# Patient Record
Sex: Male | Born: 1970 | Race: White | Hispanic: No | Marital: Married | Smoking: Never smoker
Health system: Southern US, Community
[De-identification: ages and names within clinical notes are randomized; demographics above are authoritative.]

## PROBLEM LIST (undated history)

## (undated) DIAGNOSIS — F419 Anxiety disorder, unspecified: Secondary | ICD-10-CM

## (undated) DIAGNOSIS — F1020 Alcohol dependence, uncomplicated: Secondary | ICD-10-CM

## (undated) DIAGNOSIS — I10 Essential (primary) hypertension: Secondary | ICD-10-CM

## (undated) DIAGNOSIS — E119 Type 2 diabetes mellitus without complications: Secondary | ICD-10-CM

## (undated) DIAGNOSIS — I82409 Acute embolism and thrombosis of unspecified deep veins of unspecified lower extremity: Secondary | ICD-10-CM

## (undated) HISTORY — PX: BLOOD PATCH: SHX1245

## (undated) HISTORY — PX: TONSILLECTOMY: SUR1361

---

## 1997-10-04 ENCOUNTER — Emergency Department (HOSPITAL_COMMUNITY): Admission: EM | Admit: 1997-10-04 | Discharge: 1997-10-04 | Payer: Self-pay | Admitting: Emergency Medicine

## 2007-07-04 ENCOUNTER — Emergency Department (HOSPITAL_COMMUNITY): Admission: EM | Admit: 2007-07-04 | Discharge: 2007-07-04 | Payer: Self-pay | Admitting: Emergency Medicine

## 2007-07-05 ENCOUNTER — Emergency Department (HOSPITAL_COMMUNITY): Admission: EM | Admit: 2007-07-05 | Discharge: 2007-07-05 | Payer: Self-pay | Admitting: Emergency Medicine

## 2007-10-11 ENCOUNTER — Emergency Department (HOSPITAL_COMMUNITY): Admission: EM | Admit: 2007-10-11 | Discharge: 2007-10-11 | Payer: Self-pay | Admitting: Emergency Medicine

## 2007-11-23 ENCOUNTER — Emergency Department (HOSPITAL_COMMUNITY): Admission: EM | Admit: 2007-11-23 | Discharge: 2007-11-23 | Payer: Self-pay | Admitting: Emergency Medicine

## 2007-11-25 ENCOUNTER — Emergency Department (HOSPITAL_COMMUNITY): Admission: EM | Admit: 2007-11-25 | Discharge: 2007-11-25 | Payer: Self-pay | Admitting: Family Medicine

## 2008-02-16 ENCOUNTER — Emergency Department (HOSPITAL_COMMUNITY): Admission: EM | Admit: 2008-02-16 | Discharge: 2008-02-16 | Payer: Self-pay | Admitting: Family Medicine

## 2008-06-14 ENCOUNTER — Inpatient Hospital Stay (HOSPITAL_COMMUNITY): Admission: AD | Admit: 2008-06-14 | Discharge: 2008-06-19 | Payer: Self-pay | Admitting: Psychiatry

## 2008-06-14 ENCOUNTER — Ambulatory Visit: Payer: Self-pay | Admitting: Psychiatry

## 2008-08-02 ENCOUNTER — Emergency Department (HOSPITAL_COMMUNITY): Admission: EM | Admit: 2008-08-02 | Discharge: 2008-08-02 | Payer: Self-pay | Admitting: Emergency Medicine

## 2008-12-20 ENCOUNTER — Emergency Department (HOSPITAL_COMMUNITY): Admission: EM | Admit: 2008-12-20 | Discharge: 2008-12-20 | Payer: Self-pay | Admitting: Emergency Medicine

## 2008-12-21 ENCOUNTER — Encounter: Payer: Self-pay | Admitting: Emergency Medicine

## 2008-12-22 ENCOUNTER — Ambulatory Visit: Payer: Self-pay | Admitting: Cardiology

## 2008-12-22 ENCOUNTER — Inpatient Hospital Stay (HOSPITAL_COMMUNITY): Admission: EM | Admit: 2008-12-22 | Discharge: 2008-12-22 | Payer: Self-pay | Admitting: Internal Medicine

## 2009-11-09 ENCOUNTER — Ambulatory Visit: Payer: Self-pay | Admitting: Internal Medicine

## 2009-12-27 ENCOUNTER — Encounter (INDEPENDENT_AMBULATORY_CARE_PROVIDER_SITE_OTHER): Payer: Self-pay | Admitting: *Deleted

## 2009-12-27 LAB — CONVERTED CEMR LAB: Microalb, Ur: 2.63 mg/dL — ABNORMAL HIGH (ref 0.00–1.89)

## 2009-12-28 ENCOUNTER — Inpatient Hospital Stay (HOSPITAL_COMMUNITY): Admission: EM | Admit: 2009-12-28 | Discharge: 2009-12-28 | Payer: Self-pay | Admitting: Emergency Medicine

## 2010-06-06 LAB — COMPREHENSIVE METABOLIC PANEL
ALT: 105 U/L — ABNORMAL HIGH (ref 0–53)
AST: 129 U/L — ABNORMAL HIGH (ref 0–37)
Albumin: 4.1 g/dL (ref 3.5–5.2)
Alkaline Phosphatase: 84 U/L (ref 39–117)
GFR calc Af Amer: >60 mL/min (ref >60)
Glucose, Bld: 116 mg/dL — ABNORMAL HIGH (ref 70–99)
Potassium: 3.8 mEq/L (ref 3.5–5.1)
Sodium: 135 mEq/L (ref 135–145)
Total Protein: 7.6 g/dL (ref 6.0–8.3)

## 2010-06-06 LAB — DIFFERENTIAL
Basophils Relative: 1 % (ref 0–1)
Eosinophils Absolute: 0.2 10*3/uL (ref 0.0–0.7)
Eosinophils Relative: 2 % (ref 0–5)
Lymphs Abs: 2.1 10*3/uL (ref 0.7–4.0)
Monocytes Absolute: 1 10*3/uL (ref 0.1–1.0)
Monocytes Relative: 12 % (ref 3–12)

## 2010-06-06 LAB — CBC
MCV: 89.2 fL (ref 78.0–100.0)
Platelets: 134 10*3/uL — ABNORMAL LOW (ref 150–400)
RBC: 4.89 MIL/uL (ref 4.22–5.81)
WBC: 8.3 10*3/uL (ref 4.0–10.5)

## 2010-06-06 LAB — CK: Total CK: 460 U/L — ABNORMAL HIGH (ref 7–232)

## 2010-06-27 LAB — BASIC METABOLIC PANEL
CO2: 31 mEq/L (ref 19–32)
Chloride: 98 mEq/L (ref 96–112)
GFR calc Af Amer: >60 mL/min (ref >60)
Sodium: 138 mEq/L (ref 135–145)

## 2010-06-27 LAB — CBC
Hemoglobin: 14.1 g/dL (ref 13.0–17.0)
MCHC: 35.1 g/dL (ref 30.0–36.0)
MCV: 91.1 fL (ref 78.0–100.0)
RBC: 4.43 MIL/uL (ref 4.22–5.81)
WBC: 5.2 10*3/uL (ref 4.0–10.5)

## 2010-06-27 LAB — DIFFERENTIAL
Basophils Relative: 1 % (ref 0–1)
Lymphs Abs: 0.8 10*3/uL (ref 0.7–4.0)
Monocytes Absolute: 0.6 10*3/uL (ref 0.1–1.0)
Monocytes Relative: 12 % (ref 3–12)
Neutro Abs: 3.7 10*3/uL (ref 1.7–7.7)
Neutrophils Relative %: 71 % (ref 43–77)

## 2010-06-27 LAB — MAGNESIUM: Magnesium: 1.9 mg/dL (ref 1.5–2.5)

## 2010-06-27 LAB — CARDIAC PANEL(CRET KIN+CKTOT+MB+TROPI)
CK, MB: 2.2 ng/mL (ref 0.3–4.0)
Relative Index: 1.4 (ref 0.0–2.5)
Troponin I: 0.02 ng/mL (ref 0.00–0.06)

## 2010-06-27 LAB — BRAIN NATRIURETIC PEPTIDE: Pro B Natriuretic peptide (BNP): <30 pg/mL (ref 0.0–100.0)

## 2010-06-28 LAB — TRICYCLICS SCREEN, URINE: TCA Scrn: NOT DETECTED

## 2010-06-28 LAB — DIFFERENTIAL
Basophils Absolute: 0 10*3/uL (ref 0.0–0.1)
Basophils Relative: 1 % (ref 0–1)
Eosinophils Absolute: 0 10*3/uL (ref 0.0–0.7)
Eosinophils Relative: 1 % (ref 0–5)
Eosinophils Relative: 2 % (ref 0–5)
Lymphocytes Relative: 29 % (ref 12–46)
Monocytes Absolute: 0.3 10*3/uL (ref 0.1–1.0)
Monocytes Relative: 10 % (ref 3–12)
Neutro Abs: 2.7 10*3/uL (ref 1.7–7.7)
Neutrophils Relative %: 68 % (ref 43–77)

## 2010-06-28 LAB — CBC
HCT: 44.3 % (ref 39.0–52.0)
HCT: 44.5 % (ref 39.0–52.0)
Hemoglobin: 15.4 g/dL (ref 13.0–17.0)
Hemoglobin: 15.4 g/dL (ref 13.0–17.0)
MCHC: 34.9 g/dL (ref 30.0–36.0)
MCV: 89.9 fL (ref 78.0–100.0)
RBC: 4.92 MIL/uL (ref 4.22–5.81)
RBC: 4.93 MIL/uL (ref 4.22–5.81)
WBC: 4.6 10*3/uL (ref 4.0–10.5)

## 2010-06-28 LAB — POCT CARDIAC MARKERS: Troponin i, poc: <0.05 ng/mL (ref 0.00–0.09)

## 2010-06-28 LAB — BASIC METABOLIC PANEL
CO2: 28 mEq/L (ref 19–32)
Chloride: 100 mEq/L (ref 96–112)
Creatinine, Ser: 0.64 mg/dL (ref 0.4–1.5)
GFR calc Af Amer: >60 mL/min (ref >60)

## 2010-06-28 LAB — COMPREHENSIVE METABOLIC PANEL
ALT: 116 U/L — ABNORMAL HIGH (ref 0–53)
Alkaline Phosphatase: 115 U/L (ref 39–117)
BUN: 5 mg/dL — ABNORMAL LOW (ref 6–23)
CO2: 28 mEq/L (ref 19–32)
Chloride: 97 mEq/L (ref 96–112)
GFR calc non Af Amer: >60 mL/min (ref >60)
Glucose, Bld: 138 mg/dL — ABNORMAL HIGH (ref 70–99)
Potassium: 3.7 mEq/L (ref 3.5–5.1)
Sodium: 136 mEq/L (ref 135–145)
Total Bilirubin: 0.7 mg/dL (ref 0.3–1.2)
Total Protein: 7.9 g/dL (ref 6.0–8.3)

## 2010-06-28 LAB — ETHANOL: Alcohol, Ethyl (B): 387 mg/dL — ABNORMAL HIGH (ref 0–10)

## 2010-06-28 LAB — RAPID URINE DRUG SCREEN, HOSP PERFORMED
Barbiturates: NOT DETECTED
Barbiturates: NOT DETECTED
Benzodiazepines: NOT DETECTED
Benzodiazepines: NOT DETECTED

## 2010-07-04 LAB — COMPREHENSIVE METABOLIC PANEL
ALT: 109 U/L — ABNORMAL HIGH (ref 0–53)
Albumin: 4.4 g/dL (ref 3.5–5.2)
Alkaline Phosphatase: 93 U/L (ref 39–117)
BUN: 3 mg/dL — ABNORMAL LOW (ref 6–23)
Chloride: 98 mEq/L (ref 96–112)
Glucose, Bld: 87 mg/dL (ref 70–99)
Potassium: 4 mEq/L (ref 3.5–5.1)
Sodium: 134 mEq/L — ABNORMAL LOW (ref 135–145)
Total Bilirubin: 0.7 mg/dL (ref 0.3–1.2)

## 2010-07-04 LAB — DRUGS OF ABUSE SCREEN W/O ALC, ROUTINE URINE
Amphetamine Screen, Ur: NEGATIVE
Barbiturate Quant, Ur: NEGATIVE
Benzodiazepines.: POSITIVE — AB
Cocaine Metabolites: NEGATIVE
Marijuana Metabolite: NEGATIVE

## 2010-07-04 LAB — CBC
HCT: 43.5 % (ref 39.0–52.0)
Hemoglobin: 14.9 g/dL (ref 13.0–17.0)
RBC: 4.83 MIL/uL (ref 4.22–5.81)
WBC: 4.1 10*3/uL (ref 4.0–10.5)

## 2010-07-04 LAB — DIFFERENTIAL
Basophils Absolute: 0 10*3/uL (ref 0.0–0.1)
Basophils Relative: 1 % (ref 0–1)
Eosinophils Absolute: 0 10*3/uL (ref 0.0–0.7)
Monocytes Absolute: 0.5 10*3/uL (ref 0.1–1.0)
Neutro Abs: 2.5 10*3/uL (ref 1.7–7.7)
Neutrophils Relative %: 62 % (ref 43–77)

## 2010-07-04 LAB — URINALYSIS, ROUTINE W REFLEX MICROSCOPIC
Glucose, UA: NEGATIVE mg/dL
Hgb urine dipstick: NEGATIVE
Protein, ur: NEGATIVE mg/dL
Specific Gravity, Urine: 1.008 (ref 1.005–1.030)
pH: 7 (ref 5.0–8.0)

## 2010-07-04 LAB — BENZODIAZEPINE, QUANTITATIVE, URINE
Alprazolam (GC/LC/MS), ur confirm: NEGATIVE
Oxazepam GC/MS Conf: 880 ng/mL

## 2010-08-06 NOTE — H&P (Signed)
NAME:  [name], [name] NO.:  [id number]   MEDICAL RECORD NO.:  [id number]          PATIENT TYPE:  IPS   LOCATION:  0501                          FACILITY:  [facility]   PHYSICIAN:  Landry Corporal, N.P.    DATE OF BIRTH:  1970/06/05   DATE OF ADMISSION:  06/14/2008  DATE OF DISCHARGE:                       PSYCHIATRIC ADMISSION ASSESSMENT   Redictating a psychiatric admit note on [name], a 40-year male  voluntarily admitted June 14, 2008.   HISTORY OF PRESENT ILLNESS:  The patient is here to get help with his  alcohol use.  He has been drinking as he states too much up to 18  beers a day with his last drink yesterday.  The patient states that he  is drinking during the night, drinking in the morning, waking up shaking  and needs to drink a beer.  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  He has been drinking since  the age of 33 but his drinking has been escalating.  He also recently  received a DUI and has some legal ramifications due to that.  He has had  problems with insomnia and takes an occasional Xanax to help sleep.  He  denies any weight loss.  Denies any other recreational drug use.  Denies  any depression or suicidal thinking.   PAST PSYCHIATRIC HISTORY:  First admission to [facility].  No other psychiatric admissions or has been in rehab prior.   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  It  appears to be on April 8.   FAMILY HISTORY:  Grandfather with alcohol.  Again taking an occasional  Xanax to help him sleep.  Primary care [name]:  None.  Medical  problems:  Denies any acute or chronic health issues.  Does report that  he has had elevated liver enzymes before when he tried to get life  insurance.   MEDICATIONS:  None prior to arrival.   DRUG ALLERGIES:  No known allergies.   REVIEW OF SYSTEMS:  Significant for history of chest pain related to  withdrawal symptoms.  Positive for insomnia.  Negative weight changes,  positive for nausea.  No diarrhea.  No falls.  No headaches.  No  seizures.  No depression.   PHYSICAL EXAMINATION:  Temperature of 97.9, heart rate 90, respirations  20, blood pressure is 178/114, 5 feet 5 inches tall, 166 pounds.  GENERAL APPEARANCE:  This is a well-nourished male in no acute distress.  His head is atraumatic.  Negative lymphadenopathy.  CHEST:  Clear.  BREAST:  Exam is deferred.  HEART:  Regular  rate and rhythm.  No murmurs, gallops or rubs.  ABDOMEN:  Soft abdomen, nontender, nondistended with bowel sounds  auscultated.  PELVIC/GU:  Deferred.  EXTREMITIES:  5+ against resistance.  No clubbing, no edema.  SKIN:  Warm and dry with  no obvious lacerations or rashes.  NEUROLOGIC:  Findings are intact.  He does have some mild upper  extremity tremors.   LABORATORY DATA:  TSH of 4.067, magnesium of 2.4, platelet count is low  at 89, AST is 90 with a reference range of 0 to 37, ALT is 109 with a  reference range of 0 of 53.   MENTAL STATUS EXAM:  The patient at this time is resting in bed.  He is  agreeable to talk.  He provides a good history.  His speech is clear,  normal pitch and tone.  Again his affect seems sincere and on again  provides a good history of his alcohol use and symptoms.  Thought  processes are coherent and goal directed.  Cognitive function intact.  Memory appears intact.  Judgment and insight is good.   Axis I:  Alcohol dependence.  Axis II:  Deferred.  Axis III:  No known medical conditions.  Axis IV:  Problems related to legal system.  Axis V:  Current is 40.   PLAN:  Detox patient.  Librium protocol; work on relapse prevention.  We  will continue to assess comorbidities, identify his support group.  The  patient may  benefit from a long-term treatment plan.  His tentative  length of stay at this time is 3-5 days.      Landry Corporal, N.P.     JO/MEDQ  D:  06/15/2008  T:  06/15/2008  Job:  [id number]

## 2010-08-09 NOTE — Discharge Summary (Signed)
NAME:  [name], CUFF NO.:  [id number]   MEDICAL RECORD NO.:  [id number]          PATIENT TYPE:  IPS   LOCATION:  0502                          FACILITY:  [facility]   PHYSICIAN:  Geoffery Lyons, M.D.      DATE OF BIRTH:  05-15-1970   DATE OF ADMISSION:  06/14/2008  DATE OF DISCHARGE:  06/19/2008                               DISCHARGE SUMMARY   CHIEF COMPLAINT:  This was first admission to [facility]  for this 40 year old male voluntarily admitted endorsing wanted help  with alcohol use.  Has been drinking as he states too much, up to 18  beers with his last drink the day before.  He drinks during the night.  Drinks in morning, wakes up shaking and needs to drink a beer.  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  Has been drinking  since age 16, has been escalating.  Recently received a DUI.  Problems  with insomnia, takes a Xanax every now and then.   PAST PSYCHIATRIC HISTORY:  [facility] time at [facility].  No other  previous admissions.   ALCOHOL AND DRUG HABITS:  Persistent use of alcohol as already  described.   MEDICAL HISTORY:  Noncontributory.   MEDICATIONS:  None.   PHYSICAL EXAMINATION:  Exam failed to show any acute findings.   LABORATORY WORK:  White blood cells 4.1, hemoglobin 14.9, sodium 134,  potassium 4.0, glucose 87, SGOT 90, SGPT 109.  Drug screen positive for  benzodiazepines.   MENTAL STATUS EXAM:  Reveals alert cooperative male.  Mood anxious.  Affect anxious.  Thought processes logical, coherent and relevant.  Once  a week, aware of the effects of alcohol on his system.  No active  suicidal/homicidal ideas, no hallucinations.  No delusions.  Cognition  well-preserved.   ADMISSION DIAGNOSES:  AXIS I:  Alcohol dependence.  AXIS II:  No diagnosis.  AXIS III:  Elevated liver enzymes.  AXIS IV: Moderate.  AXIS V:  On admission 35,  GAF in the last year 60.   COURSE IN THE HOSPITAL:  Was admitted, started individual and group  psychotherapy.  Detoxified with Librium.  We gave him some trazodone for  sleep.  As already stated, drinking 18 pack a day for the last 5 years.  He was laid off, started drinking more.  Unable to find construction  work, getting more frustrated.  Wakes up withdrawal and sweating,  throwing up and shaking.  He was with fiancee.  March 26 continued to be  detoxed.  He continued to have the shaky feeling, but endorsed that he  was a little better.  Felt that the Librium wore off pretty fast.  We  continued to adjust the Librium to his symptoms.  Had a family session  with the mother and the girlfriend.  By March 28, he was starting to  feel better.  Endorsed he wanted to stay another day because he stated  he was going to relapse, not sure  if he was going to still have a job.  He was going to continue to deal with the girlfriend.  Family still felt  that he needed to go to a 30-day program.  He was pretty firm that he  was not wanting to do that.  He wanted to use Xanax or Klonopin to stop  drinking.  If he was not to get the Xanax or the Klonopin, then he was  going to smoke pot.  Girlfriend was supportive of him but concerned  about his commitment to sobriety.  He had made some contact with the AA  community.  He was connected with counseling and [facility].  He  was willing to take the Antabuse and the Revia, so we went ahead and  discharged to outpatient follow-up.  Upon discharge, in full contact  with reality.  We had extended the Librium detox to facilitate his  transition home.  He was on Antabuse and Revia.   DISCHARGE DIAGNOSES:  AXIS I:  Alcohol dependence, substance-induced,  anxiety.  AXIS II:  No diagnosis.  AXIS III:  No diagnosis.  AXIS IV:  Moderate.  AXIS V:  On discharge, 55.   DISCHARGE MEDICATIONS:  1. Librium 25 one daily for 7 days then discontinue.  2.  Antabuse 250 one daily.  3. Revia 50 mg one half daily for 7 days then increase to one.   FOLLOWUP:  Follow-up at [facility] and AA      Geoffery Lyons, M.D.  Electronically Signed     IL/MEDQ  D:  07/13/2008  T:  07/13/2008  Job:  [id number]

## 2010-12-25 LAB — CULTURE, ROUTINE-ABSCESS

## 2012-08-06 ENCOUNTER — Encounter (HOSPITAL_COMMUNITY): Payer: Self-pay | Admitting: Emergency Medicine

## 2012-08-06 ENCOUNTER — Emergency Department (HOSPITAL_COMMUNITY): Payer: Self-pay

## 2012-08-06 ENCOUNTER — Inpatient Hospital Stay (HOSPITAL_COMMUNITY)
Admission: EM | Admit: 2012-08-06 | Discharge: 2012-08-17 | DRG: 438 | Disposition: A | Discharge status: Home / Self Care | Payer: MEDICAID | Attending: Internal Medicine | Admitting: Internal Medicine

## 2012-08-06 DIAGNOSIS — F10939 Alcohol use, unspecified with withdrawal, unspecified: Secondary | ICD-10-CM | POA: Diagnosis not present

## 2012-08-06 DIAGNOSIS — B370 Candidal stomatitis: Secondary | ICD-10-CM | POA: Diagnosis present

## 2012-08-06 DIAGNOSIS — Z86718 Personal history of other venous thrombosis and embolism: Secondary | ICD-10-CM

## 2012-08-06 DIAGNOSIS — E785 Hyperlipidemia, unspecified: Secondary | ICD-10-CM

## 2012-08-06 DIAGNOSIS — F10231 Alcohol dependence with withdrawal delirium: Secondary | ICD-10-CM

## 2012-08-06 DIAGNOSIS — K7010 Alcoholic hepatitis without ascites: Secondary | ICD-10-CM

## 2012-08-06 DIAGNOSIS — G9340 Encephalopathy, unspecified: Secondary | ICD-10-CM

## 2012-08-06 DIAGNOSIS — F1010 Alcohol abuse, uncomplicated: Secondary | ICD-10-CM

## 2012-08-06 DIAGNOSIS — F419 Anxiety disorder, unspecified: Secondary | ICD-10-CM

## 2012-08-06 DIAGNOSIS — Z9119 Patient's noncompliance with other medical treatment and regimen: Secondary | ICD-10-CM

## 2012-08-06 DIAGNOSIS — Z91199 Patient's noncompliance with other medical treatment and regimen due to unspecified reason: Secondary | ICD-10-CM

## 2012-08-06 DIAGNOSIS — R780 Finding of alcohol in blood: Secondary | ICD-10-CM

## 2012-08-06 DIAGNOSIS — K7460 Unspecified cirrhosis of liver: Secondary | ICD-10-CM

## 2012-08-06 DIAGNOSIS — D696 Thrombocytopenia, unspecified: Secondary | ICD-10-CM

## 2012-08-06 DIAGNOSIS — F10931 Alcohol use, unspecified with withdrawal delirium: Secondary | ICD-10-CM

## 2012-08-06 DIAGNOSIS — K859 Acute pancreatitis without necrosis or infection, unspecified: Principal | ICD-10-CM | POA: Diagnosis present

## 2012-08-06 DIAGNOSIS — K449 Diaphragmatic hernia without obstruction or gangrene: Secondary | ICD-10-CM

## 2012-08-06 DIAGNOSIS — K766 Portal hypertension: Secondary | ICD-10-CM

## 2012-08-06 DIAGNOSIS — Z87891 Personal history of nicotine dependence: Secondary | ICD-10-CM

## 2012-08-06 DIAGNOSIS — R6510 Systemic inflammatory response syndrome (SIRS) of non-infectious origin without acute organ dysfunction: Secondary | ICD-10-CM | POA: Diagnosis present

## 2012-08-06 DIAGNOSIS — I82409 Acute embolism and thrombosis of unspecified deep veins of unspecified lower extremity: Secondary | ICD-10-CM

## 2012-08-06 DIAGNOSIS — K7030 Alcoholic cirrhosis of liver without ascites: Secondary | ICD-10-CM | POA: Diagnosis present

## 2012-08-06 DIAGNOSIS — I10 Essential (primary) hypertension: Secondary | ICD-10-CM

## 2012-08-06 DIAGNOSIS — F411 Generalized anxiety disorder: Secondary | ICD-10-CM | POA: Diagnosis present

## 2012-08-06 DIAGNOSIS — R161 Splenomegaly, not elsewhere classified: Secondary | ICD-10-CM

## 2012-08-06 DIAGNOSIS — K7682 Hepatic encephalopathy: Secondary | ICD-10-CM | POA: Diagnosis present

## 2012-08-06 DIAGNOSIS — K7290 Hepatic failure, unspecified without coma: Secondary | ICD-10-CM | POA: Diagnosis present

## 2012-08-06 DIAGNOSIS — E876 Hypokalemia: Secondary | ICD-10-CM

## 2012-08-06 HISTORY — DX: Alcohol dependence, uncomplicated: F10.20

## 2012-08-06 HISTORY — DX: Acute embolism and thrombosis of unspecified deep veins of unspecified lower extremity: I82.409

## 2012-08-06 HISTORY — DX: Anxiety disorder, unspecified: F41.9

## 2012-08-06 HISTORY — DX: Essential (primary) hypertension: I10

## 2012-08-06 LAB — CBC WITH DIFFERENTIAL/PLATELET
Basophils Absolute: 0 10*3/uL (ref 0.0–0.1)
Eosinophils Absolute: 0 10*3/uL (ref 0.0–0.7)
Eosinophils Relative: 0 % (ref 0–5)
Lymphocytes Relative: 5 % — ABNORMAL LOW (ref 12–46)
MCH: 32.6 pg (ref 26.0–34.0)
MCV: 89.2 fL (ref 78.0–100.0)
Neutrophils Relative %: 81 % — ABNORMAL HIGH (ref 43–77)
Platelets: 13 10*3/uL — CL (ref 150–400)
RBC: 4.54 MIL/uL (ref 4.22–5.81)
RDW: 14.6 % (ref 11.5–15.5)
WBC: 8 10*3/uL (ref 4.0–10.5)

## 2012-08-06 LAB — CBC
HCT: 37.7 % — ABNORMAL LOW (ref 39.0–52.0)
MCH: 31.6 pg (ref 26.0–34.0)
MCHC: 35.3 g/dL (ref 30.0–36.0)
RDW: 14.9 % (ref 11.5–15.5)

## 2012-08-06 LAB — COMPREHENSIVE METABOLIC PANEL
ALT: 40 U/L (ref 0–53)
AST: 92 U/L — ABNORMAL HIGH (ref 0–37)
Alkaline Phosphatase: 131 U/L — ABNORMAL HIGH (ref 39–117)
Calcium: 9.2 mg/dL (ref 8.4–10.5)
Potassium: 3.5 mEq/L (ref 3.5–5.1)
Sodium: 132 mEq/L — ABNORMAL LOW (ref 135–145)
Total Protein: 7.9 g/dL (ref 6.0–8.3)

## 2012-08-06 LAB — URINALYSIS, ROUTINE W REFLEX MICROSCOPIC
Bilirubin Urine: NEGATIVE
Glucose, UA: 100 mg/dL — AB
Ketones, ur: NEGATIVE mg/dL
Leukocytes, UA: NEGATIVE
Protein, ur: 100 mg/dL — AB
pH: 6.5 (ref 5.0–8.0)

## 2012-08-06 LAB — LIPID PANEL
Cholesterol: 259 mg/dL — ABNORMAL HIGH (ref 0–200)
HDL: 50 mg/dL (ref >39)

## 2012-08-06 LAB — GLUCOSE, CAPILLARY: Glucose-Capillary: 135 mg/dL — ABNORMAL HIGH (ref 70–99)

## 2012-08-06 LAB — PROTIME-INR: INR: 1.28 (ref 0.00–1.49)

## 2012-08-06 LAB — LACTIC ACID, PLASMA: Lactic Acid, Venous: 1.9 mmol/L (ref 0.5–2.2)

## 2012-08-06 MED ORDER — SODIUM CHLORIDE 0.9 % IV SOLN
3.0000 g | Freq: Once | INTRAVENOUS | Status: AC
  Administered 2012-08-06: 3 g via INTRAVENOUS
  Filled 2012-08-06: qty 6

## 2012-08-06 MED ORDER — IOHEXOL 300 MG/ML  SOLN
80.0000 mL | Freq: Once | INTRAMUSCULAR | Status: AC | PRN
  Administered 2012-08-06: 80 mL via INTRAVENOUS

## 2012-08-06 MED ORDER — SODIUM CHLORIDE 0.9 % IJ SOLN
3.0000 mL | Freq: Two times a day (BID) | INTRAMUSCULAR | Status: DC
  Administered 2012-08-06 – 2012-08-13 (×9): 3 mL via INTRAVENOUS

## 2012-08-06 MED ORDER — FOLIC ACID 1 MG PO TABS
1.0000 mg | ORAL_TABLET | Freq: Every day | ORAL | Status: DC
  Administered 2012-08-06 – 2012-08-17 (×10): 1 mg via ORAL
  Filled 2012-08-06 (×12): qty 1

## 2012-08-06 MED ORDER — LORAZEPAM 2 MG/ML IJ SOLN
1.0000 mg | Freq: Four times a day (QID) | INTRAMUSCULAR | Status: DC | PRN
  Administered 2012-08-06 – 2012-08-07 (×6): 1 mg via INTRAVENOUS
  Filled 2012-08-06 (×7): qty 1

## 2012-08-06 MED ORDER — ADULT MULTIVITAMIN W/MINERALS CH
1.0000 | ORAL_TABLET | Freq: Every day | ORAL | Status: DC
  Administered 2012-08-06 – 2012-08-17 (×10): 1 via ORAL
  Filled 2012-08-06 (×12): qty 1

## 2012-08-06 MED ORDER — SODIUM CHLORIDE 0.9 % IJ SOLN
3.0000 mL | INTRAMUSCULAR | Status: DC | PRN
  Administered 2012-08-07: 3 mL via INTRAVENOUS

## 2012-08-06 MED ORDER — THIAMINE HCL 100 MG/ML IJ SOLN
100.0000 mg | Freq: Every day | INTRAMUSCULAR | Status: DC
  Administered 2012-08-10: 100 mg via INTRAVENOUS
  Filled 2012-08-06 (×12): qty 1

## 2012-08-06 MED ORDER — LORAZEPAM 2 MG/ML IJ SOLN
1.0000 mg | Freq: Once | INTRAMUSCULAR | Status: AC
  Administered 2012-08-06: 1 mg via INTRAVENOUS
  Filled 2012-08-06: qty 1

## 2012-08-06 MED ORDER — SODIUM CHLORIDE 0.9 % IV BOLUS (SEPSIS)
1000.0000 mL | Freq: Once | INTRAVENOUS | Status: AC
  Administered 2012-08-06: 1000 mL via INTRAVENOUS
  Administered 2012-08-06: 17:00:00 via INTRAVENOUS

## 2012-08-06 MED ORDER — SODIUM CHLORIDE 0.9 % IV SOLN: 250.0000 mL | INTRAVENOUS | Status: DC | PRN

## 2012-08-06 MED ORDER — SODIUM CHLORIDE 0.9 % IJ SOLN
3.0000 mL | Freq: Two times a day (BID) | INTRAMUSCULAR | Status: DC
  Administered 2012-08-06 – 2012-08-12 (×8): 3 mL via INTRAVENOUS
  Administered 2012-08-12: 09:00:00 via INTRAVENOUS
  Administered 2012-08-13: 3 mL via INTRAVENOUS

## 2012-08-06 MED ORDER — ONDANSETRON HCL 4 MG PO TABS: 4.0000 mg | ORAL_TABLET | Freq: Four times a day (QID) | ORAL | Status: DC | PRN

## 2012-08-06 MED ORDER — MORPHINE SULFATE 4 MG/ML IJ SOLN
4.0000 mg | INTRAMUSCULAR | Status: AC | PRN
  Administered 2012-08-06 (×2): 4 mg via INTRAVENOUS
  Filled 2012-08-06 (×2): qty 1

## 2012-08-06 MED ORDER — SODIUM CHLORIDE 0.9 % IV SOLN
INTRAVENOUS | Status: DC
  Administered 2012-08-06: 10:00:00 via INTRAVENOUS
  Administered 2012-08-06: 125 mL via INTRAVENOUS
  Administered 2012-08-07 – 2012-08-08 (×4): via INTRAVENOUS

## 2012-08-06 MED ORDER — IOHEXOL 300 MG/ML  SOLN
25.0000 mL | INTRAMUSCULAR | Status: AC
  Administered 2012-08-06: 25 mL via ORAL

## 2012-08-06 MED ORDER — SODIUM CHLORIDE 0.9 % IV BOLUS (SEPSIS)
500.0000 mL | Freq: Once | INTRAVENOUS | Status: AC
  Administered 2012-08-06: 500 mL via INTRAVENOUS

## 2012-08-06 MED ORDER — LORAZEPAM 1 MG PO TABS: 1.0000 mg | ORAL_TABLET | Freq: Four times a day (QID) | ORAL | Status: DC | PRN

## 2012-08-06 MED ORDER — LORAZEPAM 2 MG/ML IJ SOLN
2.0000 mg | Freq: Once | INTRAMUSCULAR | Status: AC
  Administered 2012-08-06: 2 mg via INTRAVENOUS

## 2012-08-06 MED ORDER — ONDANSETRON HCL 4 MG/2ML IJ SOLN
4.0000 mg | INTRAMUSCULAR | Status: AC | PRN
  Administered 2012-08-06 (×2): 4 mg via INTRAVENOUS
  Filled 2012-08-06 (×2): qty 2

## 2012-08-06 MED ORDER — ONDANSETRON HCL 4 MG/2ML IJ SOLN
4.0000 mg | Freq: Four times a day (QID) | INTRAMUSCULAR | Status: DC | PRN
  Administered 2012-08-06 – 2012-08-07 (×2): 4 mg via INTRAVENOUS
  Filled 2012-08-06 (×2): qty 2

## 2012-08-06 MED ORDER — PANTOPRAZOLE SODIUM 40 MG PO TBEC
40.0000 mg | DELAYED_RELEASE_TABLET | Freq: Every day | ORAL | Status: DC
  Administered 2012-08-06 – 2012-08-07 (×2): 40 mg via ORAL
  Filled 2012-08-06 (×2): qty 1

## 2012-08-06 MED ORDER — HYDROMORPHONE HCL PF 1 MG/ML IJ SOLN
0.5000 mg | Freq: Four times a day (QID) | INTRAMUSCULAR | Status: DC | PRN
  Administered 2012-08-06 – 2012-08-07 (×5): 0.5 mg via INTRAVENOUS
  Filled 2012-08-06 (×5): qty 1

## 2012-08-06 MED ORDER — VITAMIN B-1 100 MG PO TABS
100.0000 mg | ORAL_TABLET | Freq: Every day | ORAL | Status: DC
  Administered 2012-08-06 – 2012-08-17 (×9): 100 mg via ORAL
  Filled 2012-08-06 (×12): qty 1

## 2012-08-06 MED ORDER — SODIUM CHLORIDE 0.9 % IV SOLN
INTRAVENOUS | Status: DC
  Administered 2012-08-06: 12:00:00 via INTRAVENOUS

## 2012-08-06 NOTE — H&P (Signed)
Hospital Admission Note Date: 08/06/2012  Patient name: [name] Medical record number: [id number] Date of birth: 1971-02-06 Age: 42 y.o. Gender: male PCP: No primary provider on file.  Medical Service: IMTS  Attending physician:  Dr. Kem Kays    1st Contact:  Dr. Burtis Junes  Pager: [phone] 2nd Contact:  Dr. Dierdre Searles   [phone] After 5 pm or weekends: 1st Contact:      Pager: [phone] 2nd Contact:      Pager: [phone]  Chief Complaint: Abdominal pain  History of Present Illness: [name] 42 yo man former [facility] pt with a pmh of excessive EtOH use consisting of a 12 pack beer per day last drink being morning of admission and HTN. Pt was experiencing generalized epigastric pain that worsened overnight and unrelieved by pepto bismol. Pt has had one similar episode that was less severe in nature 4+ months ago but didn't seek medical attention. Pt has not seen physician in 6+ months and was not compliant with HTN meds. Pt has had some epistasis episodes over past 2 months and easy bruising but no sites of active bleeding, hematemesis, hematochezia, PRBPR, or hemoptysis. Denied any fever/chills, diarrhea, constipation, CP, SOB, HA, blurry vision, recent sick contacts, or travel. 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 Per the pt and mother who is present for the interview states pt has been to rehab and detox several times with longest period of sobriety lasting about 5 months. Relapsing triggers appear to be social groups and celebrations.   Meds: Current Outpatient Rx  Name  Route  Sig  Dispense  Refill  . ALPRAZolam (XANAX) 0.5 MG tablet   Oral   Take 0.25 mg by mouth at bedtime as needed for sleep or anxiety.           Allergies: Allergies as of 08/06/2012  . (No Known Allergies)   Past Medical History  Diagnosis Date  . DVT (deep venous thrombosis)   . Anxiety   . Alcoholic   . Hypertension    Past Surgical History   Procedure Laterality Date  . Tonsillectomy     No family history on file. History   Social History  . Marital Status: Single    Spouse Name: N/A    Number of Children: N/A  . Years of Education: N/A   Occupational History  . Not on file.   Social History Main Topics  . Smoking status: Former Games developer  . Smokeless tobacco: Not on file  . Alcohol Use: 7.2 oz/week    12 Cans of beer per week  . Drug Use: No  . Sexually Active: Not on file   Other Topics Concern  . Not on file   Social History Narrative  . No narrative on file    Review of Systems: Pertinent items are noted in HPI.  Physical Exam: Blood pressure 163/95, pulse 92, height 5\' 7"  (1.702 m), weight 175 lb (79.379 kg), SpO2 94.00%. BP 163/95  Pulse 92  Ht 5\' 7"  (1.702 m)  Wt 175 lb (79.379 kg)  BMI 27.4 kg/m2  SpO2 94%  General Appearance:    Alert, cooperative, no distress, appears stated age  Head:    Normocephalic, without obvious abnormality, atraumatic  Eyes:    PERRL, conjunctiva/corneas clear, EOM's intact, fundi    benign, both eyes       Ears:    Normal TM's and external ear canals, both ears  Nose:   Nares  normal, septum midline, mucosa normal, no drainage    or sinus tenderness  Throat:   Lips, mucosa, and tongue normal; teeth and gums normal, MMM  Neck:   Supple, symmetrical, trachea midline, no adenopathy;       thyroid:  No enlargement/tenderness/nodules; no carotid   bruit or JVD  Back:     Symmetric, no curvature, ROM normal, no CVA tenderness  Lungs:     Clear to auscultation bilaterally, respirations unlabored  Chest wall:    No tenderness or deformity  Heart:    Regular rate and rhythm, S1 and S2 normal, no murmur, rub   or gallop  Abdomen:     Soft, slightly distended, no fluid wave, hepatomegaly 3cm under costophrenic angle, tenderness to deep palpation epigastric no voluntary guarding or rebounding , bowel sounds active all four quadrants,  no masses  Extremities:   Extremities  normal, atraumatic, no cyanosis or edema  Pulses:   2+ and symmetric all extremities  Skin:   Skin color, texture, turgor normal, no rashes or lesions  Lymph nodes:   Cervical, supraclavicular, and axillary nodes normal  Neurologic:   CNII-XII intact. Normal strength, sensation and reflexes      throughout    Lab results: Basic Metabolic Panel:  Recent Labs  16/10/96 0934  NA 132*  K 3.5  CL 93*  CO2 21  GLUCOSE 143*  BUN 3*  CREATININE 0.44*  CALCIUM 9.2   Liver Function Tests:  Recent Labs  08/06/12 0934  AST 92*  ALT 40  ALKPHOS 131*  BILITOT 2.0*  PROT 7.9  ALBUMIN 3.4*    Recent Labs  08/06/12 0934  LIPASE 146*   CBC:  Recent Labs  08/06/12 0934  WBC 8.0  NEUTROABS 6.5  HGB 14.8  HCT 40.5  MCV 89.2  PLT 13*   Urine Drug Screen: Drugs of Abuse     Component Value Date/Time   LABOPIA NONE DETECTED 12/21/2008 1123   LABOPIA NEGATIVE 06/19/2008 0624   COCAINSCRNUR NONE DETECTED 12/21/2008 1123   COCAINSCRNUR NEGATIVE 06/19/2008 0624   LABBENZ NONE DETECTED 12/21/2008 1123   LABBENZ  Value: POSITIVE (NOTE) Result repeated and verified. Sent for confirmatory testing* 06/19/2008 0624   AMPHETMU NONE DETECTED 12/21/2008 1123   AMPHETMU NEGATIVE 06/19/2008 0624   THCU NONE DETECTED 12/21/2008 1123   LABBARB  Value: NONE DETECTED        DRUG SCREEN FOR MEDICAL PURPOSES ONLY.  IF CONFIRMATION IS NEEDED FOR ANY PURPOSE, NOTIFY LAB WITHIN 5 DAYS.        LOWEST DETECTABLE LIMITS FOR URINE DRUG SCREEN Drug Class       Cutoff (ng/mL) Amphetamine      1000 Barbiturate      200 Benzodiazepine   200 Tricyclics       300 Opiates          300 Cocaine          300 THC              50 12/21/2008 1123    Alcohol Level:  Recent Labs  08/06/12 0934  ETH 198*   Urinalysis:  Recent Labs  08/06/12 0945  COLORURINE YELLOW  LABSPEC 1.004*  PHURINE 6.5  GLUCOSEU 100*  HGBUR SMALL*  BILIRUBINUR NEGATIVE  KETONESUR NEGATIVE  PROTEINUR 100*  UROBILINOGEN 1.0  NITRITE  NEGATIVE  LEUKOCYTESUR NEGATIVE   Imaging results:  Dg Chest 2 View  08/06/2012   *RADIOLOGY REPORT*  Clinical Data: Abdominal pain, hypertension  CHEST - 2 VIEW  Comparison: 12/21/2008  Findings: Stable heart size and vascularity.  No focal pneumonia, collapse, consolidation, edema pattern, effusion or pneumothorax. Trachea midline.  Hiatal hernia noted with an air-fluid level.  IMPRESSION: Hiatal hernia.  No acute chest process.   Original Report Authenticated By: Judie Petit. Miles Costain, M.D.   Ct Abdomen Pelvis W Contrast  08/06/2012   *RADIOLOGY REPORT*  Clinical Data: Crampy abdominal pain across the lower abdomen, nausea and vomiting  CT ABDOMEN AND PELVIS WITH CONTRAST  Technique:  Multidetector CT imaging of the abdomen and pelvis was performed following the standard protocol during bolus administration of intravenous contrast.  Contrast: 80mL OMNIPAQUE IOHEXOL 300 MG/ML  SOLN  Comparison: None.  Findings:  Stigmata of cirrhosis with nodularity of the hepatic contour. Findings suggestive of portal venous hypertension with recanalization of the periumbilical vein and splenomegaly, with the spleen measuring approximately 17 cm in length (image 28, series 2).  The portal vein is patent. There are no definitive gastric varices identified on this examination.  There are multiple hypoattenuating lesions scattered throughout the liver with dominant lesion within the inferior aspect of the right lobe of liver measuring 8 mm in diameter (image 7, series 5, too small to accurately characterize.   Normal appearance of the gallbladder. No intra or extrahepatic biliary duct dilatation.  No ascites.  There is a minimal amount of stranding about the tail of the pancreas (image 38 and 41, series 2 which will nonspecific may be indicative of acute uncomplicated pancreatitis.  There is homogeneous enhancement of the pancreatic parenchyma.  No definite pancreatic duct dilatation on this non pancreatic protocol CT.  There is  symmetric enhancement and excretion of the bilateral kidneys.  Note is made of a slightly horizontal lie of the left kidney, likely secondary to mass effect from the enlarged spleen. No discrete renal lesions.  No definite renal stones in the postcontrast examination.  No urinary obstruction. Normal appearance of the bilateral adrenal glands.  Moderate to large sized hiatal hernia.  Ingested enteric contrast extends to the level of the rectum.  No evidence of enteric obstruction.  The bowel is normal in course and caliber without wall thickening or evidence of obstruction.  Normal appearance of the appendix.  No pneumoperitoneum, pneumatosis or portal venous gas.  Limited visualization of the lower thorax is negative for focal airspace opacity or pleural effusion.  Normal heart size.  No pericardial effusion.  No acute or aggressive osseous abnormalities.  IMPRESSION:  1.  Subtle stranding about the tail of the pancreas suggestive of acute uncomplicated pancreatitis.  Correlation with amylase and lipase is recommended. 2.  Stigmata of cirrhosis and portal venous hypertension as above. 3.  There are multiple hypoattenuating lesions scattered throughout the liver which are too small to accurately characterize.  Given stigmata of cirrhosis, further evaluation for hepatoma may be obtained with non emergent abdominal MRI as clinically indicated.  4.  Moderate to large size hiatal hernia.   Original Report Authenticated By: Tacey Ruiz, MD    Assessment & Plan by Problem: 1. Acute uncomplicated pancreatitis: elevated lipase and CT Abd findings consistent with uncomplicated pancreatitis. AG 18 w/o ketones in urine ddx include alcoholic ketoacidosis but would need to r/o MI and mesenteric ischemia given non-focal exam. Pt denied any trauma, steroids, other drugs, recent GI procedures, AI disease, or hyperCa.  -NPO -IVF 125cc -NS 1L bolus (would continue based on pt response in terms of UO) -dilaudid q6 prn  pain -bmet daily -  zofran prn -EKG, tropsx3 -lactate -Mg -lipid panel for TG  2. Thrombocytopenia in setting of liver cirrhosis 2/2 EtOH: Pt has had chronic thrombocytopenia as low as 55 but today 13.  -repeat CBC -PT/INR -SCDs  3. Alcoholic hepatitis: glasgow alcoholic hepatitis score wasn't able to be fully calculated given missing INR and PT. CMP showed slightly elevated AST 92 which is lower than pts usual presentation and slightly elevated AlkPO4 at 131 and Total Bili 2.0.  -cardiac monitor -hepatitis panel -pt/ptt/INR -UDS -CIWA protocol  DVT pptx: SCDs   Dispo: Disposition is deferred at this time, awaiting improvement of current medical problems. Anticipated discharge in approximately 2-3 day(s).   The patient does not have a current PCP (No primary provider on file.), therefore will not be requiring [facility] follow-up after discharge.   The patient does not have transportation limitations that hinder transportation to clinic appointments.  Signed: Christen Bame 08/06/2012, 1:13 PM

## 2012-08-06 NOTE — ED Notes (Signed)
Attempted to call report. Nurse to call back in 10mins.  

## 2012-08-06 NOTE — Progress Notes (Signed)
Utilization review completed.  P.J. Marks Scalera,RN,BSN Case Manager [phone]  

## 2012-08-06 NOTE — ED Notes (Signed)
Platelet critical value given to Clarene Duke, MD.

## 2012-08-06 NOTE — ED Notes (Signed)
CT advised okay for patient to drink second cup of contrast.   Patient drinking at this time.

## 2012-08-06 NOTE — ED Provider Notes (Signed)
History     CSN: [id number]  Arrival date & time 08/06/12  4098   First MD Initiated Contact with Patient 08/06/12 [phone]      Chief Complaint  Patient presents with  . Abdominal Pain     HPI Pt was seen at 0850.   Per pt and family, c/o gradual onset and persistence of constant generalized abd "pain" since yesterday.  Has been associated with nausea and several intermittent episodes of dry heaving.  Describes the abd pain as "cramping."  States he has had similar symptoms several months ago, but "they only lasted a day" before spontaneously resolving and he did not see medical evaluation.  Endorses hx daily etoh use, LD this morning.  Denies diarrhea, no fevers, no back pain, no rash, no CP/SOB, no black or blood in stools or emesis, no testicular pain/swelling.       Past Medical History  Diagnosis Date  . DVT (deep venous thrombosis)   . Anxiety   . Alcoholic     Past Surgical History  Procedure Laterality Date  . Tonsillectomy       History  Substance Use Topics  . Smoking status: Former Games developer  . Smokeless tobacco: Not on file  . Alcohol Use: 7.2 oz/week    12 Cans of beer per week      Review of Systems ROS: Statement: All systems negative except as marked or noted in the HPI; Constitutional: Negative for fever and chills. ; ; Eyes: Negative for eye pain, redness and discharge. ; ; ENMT: Negative for ear pain, hoarseness, nasal congestion, sinus pressure and sore throat. ; ; Cardiovascular: Negative for chest pain, palpitations, diaphoresis, dyspnea and peripheral edema. ; ; Respiratory: Negative for cough, wheezing and stridor. ; ; Gastrointestinal: +nausea, abd pain. Negative for vomiting, diarrhea, blood in stool, hematemesis, jaundice and rectal bleeding. . ; ; Genitourinary: Negative for dysuria, flank pain and hematuria. ; ; Musculoskeletal: Negative for back pain and neck pain. Negative for swelling and trauma.; ; Skin: Negative for pruritus, rash, abrasions,  blisters, bruising and skin lesion.; ; Neuro: Negative for headache, lightheadedness and neck stiffness. Negative for weakness, altered level of consciousness , altered mental status, extremity weakness, paresthesias, involuntary movement, seizure and syncope.       Allergies  Review of patient's allergies indicates no known allergies.  Home Medications  No current outpatient prescriptions on file.  Ht 5\' 7"  (1.702 m)  Wt 175 lb (79.379 kg)  BMI 27.4 kg/m2  Physical Exam 0855: Physical examination:  Nursing notes reviewed; Vital signs and O2 SAT reviewed;  Constitutional: Well developed, Well nourished, Well hydrated, Uncomfortable appearing; Head:  Normocephalic, atraumatic; Eyes: EOMI, PERRL, No scleral icterus; ENMT: Mouth and pharynx normal, Mucous membranes moist; Neck: Supple, Full range of motion, No lymphadenopathy; Cardiovascular: Regular rate and rhythm, No murmur, rub, or gallop; Respiratory: Breath sounds clear & equal bilaterally, No rales, rhonchi, wheezes.  Speaking full sentences with ease, Normal respiratory effort/excursion; Chest: Nontender, Movement normal; Abdomen: Soft, +diffusely tender to palp, esp mid-epigastric area. No rebound or guarding. Nondistended, Normal bowel sounds; Genitourinary: No CVA tenderness; Extremities: Pulses normal, No tenderness, No edema, No calf edema or asymmetry.; Neuro: AA&Ox3, Major CN grossly intact.  Speech clear. No gross focal motor or sensory deficits in extremities.; Skin: Color normal, Warm, Dry.   ED Course  Procedures      MDM  MDM Reviewed: previous chart, nursing note and vitals Reviewed previous: labs Interpretation: labs, x-ray and CT scan  Results for orders placed during the hospital encounter of 08/06/12  ETHANOL      Result Value Range   Alcohol, Ethyl (B) 198 (*) 0 - 11 mg/dL  URINALYSIS, ROUTINE W REFLEX MICROSCOPIC      Result Value Range   Color, Urine YELLOW  YELLOW   APPearance CLEAR  CLEAR    Specific Gravity, Urine 1.004 (*) 1.005 - 1.030   pH 6.5  5.0 - 8.0   Glucose, UA 100 (*) NEGATIVE mg/dL   Hgb urine dipstick SMALL (*) NEGATIVE   Bilirubin Urine NEGATIVE  NEGATIVE   Ketones, ur NEGATIVE  NEGATIVE mg/dL   Protein, ur 161 (*) NEGATIVE mg/dL   Urobilinogen, UA 1.0  0.0 - 1.0 mg/dL   Nitrite NEGATIVE  NEGATIVE   Leukocytes, UA NEGATIVE  NEGATIVE  CBC WITH DIFFERENTIAL      Result Value Range   WBC 8.0  4.0 - 10.5 K/uL   RBC 4.54  4.22 - 5.81 MIL/uL   Hemoglobin 14.8  13.0 - 17.0 g/dL   HCT 09.6  04.5 - 40.9 %   MCV 89.2  78.0 - 100.0 fL   MCH 32.6  26.0 - 34.0 pg   MCHC 36.5 (*) 30.0 - 36.0 g/dL   RDW 81.1  91.4 - 78.2 %   Platelets 13 (*) 150 - 400 K/uL   Neutrophils Relative % 81 (*) 43 - 77 %   Neutro Abs 6.5  1.7 - 7.7 K/uL   Lymphocytes Relative 5 (*) 12 - 46 %   Lymphs Abs 0.4 (*) 0.7 - 4.0 K/uL   Monocytes Relative 14 (*) 3 - 12 %   Monocytes Absolute 1.1 (*) 0.1 - 1.0 K/uL   Eosinophils Relative 0  0 - 5 %   Eosinophils Absolute 0.0  0.0 - 0.7 K/uL   Basophils Relative 0  0 - 1 %   Basophils Absolute 0.0  0.0 - 0.1 K/uL  COMPREHENSIVE METABOLIC PANEL      Result Value Range   Sodium 132 (*) 135 - 145 mEq/L   Potassium 3.5  3.5 - 5.1 mEq/L   Chloride 93 (*) 96 - 112 mEq/L   CO2 21  19 - 32 mEq/L   Glucose, Bld 143 (*) 70 - 99 mg/dL   BUN 3 (*) 6 - 23 mg/dL   Creatinine, Ser 9.56 (*) 0.50 - 1.35 mg/dL   Calcium 9.2  8.4 - 21.3 mg/dL   Total Protein 7.9  6.0 - 8.3 g/dL   Albumin 3.4 (*) 3.5 - 5.2 g/dL   AST 92 (*) 0 - 37 U/L   ALT 40  0 - 53 U/L   Alkaline Phosphatase 131 (*) 39 - 117 U/L   Total Bilirubin 2.0 (*) 0.3 - 1.2 mg/dL   GFR calc non Af Amer >90  >90 mL/min   GFR calc Af Amer >90  >90 mL/min  LIPASE, BLOOD      Result Value Range   Lipase 146 (*) 11 - 59 U/L  URINE MICROSCOPIC-ADD ON      Result Value Range   RBC / HPF 0-2  <3 RBC/hpf   Dg Chest 2 View 08/06/2012   *RADIOLOGY REPORT*  Clinical Data: Abdominal pain, hypertension   CHEST - 2 VIEW  Comparison: 12/21/2008  Findings: Stable heart size and vascularity.  No focal pneumonia, collapse, consolidation, edema pattern, effusion or pneumothorax. Trachea midline.  Hiatal hernia noted with an air-fluid level.  IMPRESSION: Hiatal hernia.  No acute chest  process.   Original Report Authenticated By: Judie Petit. Miles Costain, M.D.   Ct Abdomen Pelvis W Contrast 08/06/2012   *RADIOLOGY REPORT*  Clinical Data: Crampy abdominal pain across the lower abdomen, nausea and vomiting  CT ABDOMEN AND PELVIS WITH CONTRAST  Technique:  Multidetector CT imaging of the abdomen and pelvis was performed following the standard protocol during bolus administration of intravenous contrast.  Contrast: 80mL OMNIPAQUE IOHEXOL 300 MG/ML  SOLN  Comparison: None.  Findings:  Stigmata of cirrhosis with nodularity of the hepatic contour. Findings suggestive of portal venous hypertension with recanalization of the periumbilical vein and splenomegaly, with the spleen measuring approximately 17 cm in length (image 28, series 2).  The portal vein is patent. There are no definitive gastric varices identified on this examination.  There are multiple hypoattenuating lesions scattered throughout the liver with dominant lesion within the inferior aspect of the right lobe of liver measuring 8 mm in diameter (image 7, series 5, too small to accurately characterize.   Normal appearance of the gallbladder. No intra or extrahepatic biliary duct dilatation.  No ascites.  There is a minimal amount of stranding about the tail of the pancreas (image 38 and 41, series 2 which will nonspecific may be indicative of acute uncomplicated pancreatitis.  There is homogeneous enhancement of the pancreatic parenchyma.  No definite pancreatic duct dilatation on this non pancreatic protocol CT.  There is symmetric enhancement and excretion of the bilateral kidneys.  Note is made of a slightly horizontal lie of the left kidney, likely secondary to mass effect from  the enlarged spleen. No discrete renal lesions.  No definite renal stones in the postcontrast examination.  No urinary obstruction. Normal appearance of the bilateral adrenal glands.  Moderate to large sized hiatal hernia.  Ingested enteric contrast extends to the level of the rectum.  No evidence of enteric obstruction.  The bowel is normal in course and caliber without wall thickening or evidence of obstruction.  Normal appearance of the appendix.  No pneumoperitoneum, pneumatosis or portal venous gas.  Limited visualization of the lower thorax is negative for focal airspace opacity or pleural effusion.  Normal heart size.  No pericardial effusion.  No acute or aggressive osseous abnormalities.  IMPRESSION:  1.  Subtle stranding about the tail of the pancreas suggestive of acute uncomplicated pancreatitis.  Correlation with amylase and lipase is recommended. 2.  Stigmata of cirrhosis and portal venous hypertension as above. 3.  There are multiple hypoattenuating lesions scattered throughout the liver which are too small to accurately characterize.  Given stigmata of cirrhosis, further evaluation for hepatoma may be obtained with non emergent abdominal MRI as clinically indicated.  4.  Moderate to large size hiatal hernia.   Original Report Authenticated By: Tacey Ruiz, MD    Results for CARDALE, DORER (MRN [id number]) as of 08/06/2012 12:20  Ref. Range 06/14/2008 21:12 12/20/2008 20:00 12/28/2009 01:20 08/06/2012 09:34  AST Latest Range: 0-37 U/L 90 (H) 124 (H) 129 (H) 92 (H)  ALT Latest Range: 0-53 U/L 109 (H) 116 (H) 105 (H) 40     Results for MACGREGOR, AESCHLIMAN (MRN [id number]) as of 08/06/2012 12:20  Ref. Range 06/14/2008 21:12 12/20/2008 20:00 12/21/2008 11:32 12/22/2008 05:20 12/28/2009 01:20 08/06/2012 09:34  Platelets Latest Range: 150-400 K/uL 89 (L) 78 (L) 66 (L) 51 (L) 134 (L) 13 (LL)    1235:  Pt starting to feel tremorous; will dose IV ativan.  New thrombocytopenia.  Dx and testing d/w pt and  family.  Questions answered.  Verb understanding, agreeable to admit.  T/C to [facility] Resident, case discussed, including:  HPI, pertinent PM/SHx, VS/PE, dx testing, ED course and treatment:  Agreeable to admit, requests they will come to the ED for eval.         Laray Anger, DO 08/07/12 1043

## 2012-08-06 NOTE — ED Notes (Signed)
Pt states last drink was at 0830 this morning.

## 2012-08-06 NOTE — ED Notes (Signed)
Patient states started having cramping pain yesterday across lower abdomen.  Patient states he did have dry heaves this morning.   Patient states he has had some nausea.  Denies diarrhea.

## 2012-08-07 LAB — BASIC METABOLIC PANEL
CO2: 24 mEq/L (ref 19–32)
Calcium: 8.7 mg/dL (ref 8.4–10.5)
Glucose, Bld: 129 mg/dL — ABNORMAL HIGH (ref 70–99)
Sodium: 131 mEq/L — ABNORMAL LOW (ref 135–145)

## 2012-08-07 LAB — HEPATITIS PANEL, ACUTE
Hep A IgM: NEGATIVE
Hep B C IgM: NEGATIVE
Hepatitis B Surface Ag: NEGATIVE

## 2012-08-07 LAB — TSH: TSH: 2.13 u[IU]/mL (ref 0.350–4.500)

## 2012-08-07 LAB — URINE CULTURE
Colony Count: NO GROWTH
Culture: NO GROWTH

## 2012-08-07 LAB — CBC
Hemoglobin: 13.4 g/dL (ref 13.0–17.0)
MCH: 32 pg (ref 26.0–34.0)
MCV: 90.5 fL (ref 78.0–100.0)
RBC: 4.19 MIL/uL — ABNORMAL LOW (ref 4.22–5.81)

## 2012-08-07 MED ORDER — PNEUMOCOCCAL VAC POLYVALENT 25 MCG/0.5ML IJ INJ
0.5000 mL | INJECTION | INTRAMUSCULAR | Status: AC
  Administered 2012-08-08: 0.5 mL via INTRAMUSCULAR
  Filled 2012-08-07: qty 0.5

## 2012-08-07 NOTE — H&P (Signed)
INTERNAL MEDICINE TEACHING SERVICE Attending Admission Note  Date: 08/07/2012  Patient name: [name]  Medical record number: [id number]  Date of birth: [birth year]    I have seen and evaluated Erick Alley and discussed their care with the Residency Team.  41 yr. Old WM w/ hx alcoholism, hx DVT, HTN, anxiety disorder, presented with abdominal pain.  He states he drinks about a 12 pack of beer a day for years.  He stated he was having generalized abdominal pain mostly over upper abdomen that did not improve with pepto bismol.  He states he's had this pain before but it has usually gone away on its own.  He has not been taking any of his medications. He admits to some recent bloody noses and easy bruising but no significant bleeding. He denies melena, hematochezia, hematuria.   He was noted to have evidence of alcoholic hepatitis on admission with AST 92 ALT 40, thrombocytopenia of 13k, and an alcohol level of 198.  CT of the abdomen suggested pancreatitis as well as some lesions scattered throughout his liver that need follow up with MRI.   This morning he feels better and his abdominal pain is improved. No melena, no hematochezia. Minimal epistaxis.  No N/V.  He feels like his withdrawal symptoms are controlled. I had a lengthy discussion with him about alcohol cessation.  Physical Exam: Blood pressure 158/90, pulse 83, temperature 98.1 F (36.7 C), temperature source Oral, resp. rate 22, height 5\' 7"  (1.702 m), weight 173 lb 15.1 oz (78.9 kg), SpO2 92.00%.  General: Vital signs reviewed and noted. Well-developed, well-nourished, in no acute distress; alert, appropriate and cooperative throughout examination.  Head: Normocephalic, atraumatic.  Eyes: PERRL, EOMI, No signs of anemia or jaundince.  Nose: Mucous membranes moist, not inflammed, nonerythematous.  Throat: Oropharynx nonerythematous, no exudate appreciated.   Neck: No deformities, masses, or tenderness noted.Supple, No  carotid Bruits, no JVD.  Lungs:  Normal respiratory effort. Clear to auscultation BL without crackles or wheezes.  Heart: RRR. S1 and S2 normal without gallop, murmur, or rubs.  Abdomen:  +BS. Minimal epigastric/LUQ tenderness. Palpable spleen. No guarding. No ascites.  Extremities: No pretibial edema.  Neurologic: A&O X3, CN II - XII are grossly intact. Motor strength is 5/5 in the all 4 extremities, Sensations intact to light touch, Cerebellar signs negative.  Skin: No visible rashes, scars.    Lab results: Results for orders placed during the hospital encounter of 08/06/12 (from the past 24 hour(s))  PROTIME-INR     Status: Abnormal   Collection Time    08/06/12  2:13 PM      Result Value Range   Prothrombin Time 15.7 (*) 11.6 - 15.2 seconds   INR 1.28  0.00 - 1.49  CBC     Status: Abnormal   Collection Time    08/06/12  2:13 PM      Result Value Range   WBC 7.4  4.0 - 10.5 K/uL   RBC 4.21 (*) 4.22 - 5.81 MIL/uL   Hemoglobin 13.3  13.0 - 17.0 g/dL   HCT 86.5 (*) 78.4 - 69.6 %   MCV 89.5  78.0 - 100.0 fL   MCH 31.6  26.0 - 34.0 pg   MCHC 35.3  30.0 - 36.0 g/dL   RDW 29.5  28.4 - 13.2 %   Platelets 11 (*) 150 - 400 K/uL  MAGNESIUM     Status: None   Collection Time    08/06/12  2:13 PM  Result Value Range   Magnesium 1.5  1.5 - 2.5 mg/dL  APTT     Status: Abnormal   Collection Time    08/06/12  2:14 PM      Result Value Range   aPTT 38 (*) 24 - 37 seconds  TROPONIN I     Status: None   Collection Time    08/06/12  2:17 PM      Result Value Range   Troponin I <0.30  <0.30 ng/mL  GLUCOSE, CAPILLARY     Status: Abnormal   Collection Time    08/06/12  3:21 PM      Result Value Range   Glucose-Capillary 135 (*) 70 - 99 mg/dL  LACTIC ACID, PLASMA     Status: None   Collection Time    08/06/12  4:48 PM      Result Value Range   Lactic Acid, Venous 1.9  0.5 - 2.2 mmol/L  LIPID PANEL     Status: Abnormal   Collection Time    08/06/12  4:50 PM      Result Value  Range   Cholesterol 259 (*) 0 - 200 mg/dL   Triglycerides 784  <696 mg/dL   HDL 50  >29 mg/dL   Total CHOL/HDL Ratio 5.2     VLDL 22  0 - 40 mg/dL   LDL Cholesterol 528 (*) 0 - 99 mg/dL  TROPONIN I     Status: None   Collection Time    08/06/12  8:30 PM      Result Value Range   Troponin I <0.30  <0.30 ng/mL  BASIC METABOLIC PANEL     Status: Abnormal   Collection Time    08/07/12  2:21 AM      Result Value Range   Sodium 131 (*) 135 - 145 mEq/L   Potassium 4.0  3.5 - 5.1 mEq/L   Chloride 99  96 - 112 mEq/L   CO2 24  19 - 32 mEq/L   Glucose, Bld 129 (*) 70 - 99 mg/dL   BUN 5 (*) 6 - 23 mg/dL   Creatinine, Ser 4.13 (*) 0.50 - 1.35 mg/dL   Calcium 8.7  8.4 - 24.4 mg/dL   GFR calc non Af Amer >90  >90 mL/min   GFR calc Af Amer >90  >90 mL/min  CBC     Status: Abnormal   Collection Time    08/07/12  2:21 AM      Result Value Range   WBC 8.7  4.0 - 10.5 K/uL   RBC 4.19 (*) 4.22 - 5.81 MIL/uL   Hemoglobin 13.4  13.0 - 17.0 g/dL   HCT 01.0 (*) 27.2 - 53.6 %   MCV 90.5  78.0 - 100.0 fL   MCH 32.0  26.0 - 34.0 pg   MCHC 35.4  30.0 - 36.0 g/dL   RDW 64.4  03.4 - 74.2 %   Platelets 11 (*) 150 - 400 K/uL  MAGNESIUM     Status: None   Collection Time    08/07/12  2:21 AM      Result Value Range   Magnesium 2.1  1.5 - 2.5 mg/dL  TROPONIN I     Status: None   Collection Time    08/07/12  2:21 AM      Result Value Range   Troponin I <0.30  <0.30 ng/mL    Imaging results:  Dg Chest 2 View  08/06/2012   *RADIOLOGY REPORT*  Clinical Data: Abdominal  pain, hypertension  CHEST - 2 VIEW  Comparison: 12/21/2008  Findings: Stable heart size and vascularity.  No focal pneumonia, collapse, consolidation, edema pattern, effusion or pneumothorax. Trachea midline.  Hiatal hernia noted with an air-fluid level.  IMPRESSION: Hiatal hernia.  No acute chest process.   Original Report Authenticated By: Judie Petit. Miles Costain, M.D.   Ct Abdomen Pelvis W Contrast  08/06/2012   *RADIOLOGY REPORT*  Clinical Data:  Crampy abdominal pain across the lower abdomen, nausea and vomiting  CT ABDOMEN AND PELVIS WITH CONTRAST  Technique:  Multidetector CT imaging of the abdomen and pelvis was performed following the standard protocol during bolus administration of intravenous contrast.  Contrast: 80mL OMNIPAQUE IOHEXOL 300 MG/ML  SOLN  Comparison: None.  Findings:  Stigmata of cirrhosis with nodularity of the hepatic contour. Findings suggestive of portal venous hypertension with recanalization of the periumbilical vein and splenomegaly, with the spleen measuring approximately 17 cm in length (image 28, series 2).  The portal vein is patent. There are no definitive gastric varices identified on this examination.  There are multiple hypoattenuating lesions scattered throughout the liver with dominant lesion within the inferior aspect of the right lobe of liver measuring 8 mm in diameter (image 7, series 5, too small to accurately characterize.   Normal appearance of the gallbladder. No intra or extrahepatic biliary duct dilatation.  No ascites.  There is a minimal amount of stranding about the tail of the pancreas (image 38 and 41, series 2 which will nonspecific may be indicative of acute uncomplicated pancreatitis.  There is homogeneous enhancement of the pancreatic parenchyma.  No definite pancreatic duct dilatation on this non pancreatic protocol CT.  There is symmetric enhancement and excretion of the bilateral kidneys.  Note is made of a slightly horizontal lie of the left kidney, likely secondary to mass effect from the enlarged spleen. No discrete renal lesions.  No definite renal stones in the postcontrast examination.  No urinary obstruction. Normal appearance of the bilateral adrenal glands.  Moderate to large sized hiatal hernia.  Ingested enteric contrast extends to the level of the rectum.  No evidence of enteric obstruction.  The bowel is normal in course and caliber without wall thickening or evidence of obstruction.   Normal appearance of the appendix.  No pneumoperitoneum, pneumatosis or portal venous gas.  Limited visualization of the lower thorax is negative for focal airspace opacity or pleural effusion.  Normal heart size.  No pericardial effusion.  No acute or aggressive osseous abnormalities.  IMPRESSION:  1.  Subtle stranding about the tail of the pancreas suggestive of acute uncomplicated pancreatitis.  Correlation with amylase and lipase is recommended. 2.  Stigmata of cirrhosis and portal venous hypertension as above. 3.  There are multiple hypoattenuating lesions scattered throughout the liver which are too small to accurately characterize.  Given stigmata of cirrhosis, further evaluation for hepatoma may be obtained with non emergent abdominal MRI as clinically indicated.  4.  Moderate to large size hiatal hernia.   Original Report Authenticated By: Tacey Ruiz, MD     Assessment and Plan: I agree with the formulated Assessment and Plan with the following changes: 41 yr. Old WM w/ hx alcoholism, hx DVT, HTN, anxiety disorder, presented with abdominal pain, with acute pancreatitis, alcoholic hepatitis, thrombocytopenia. -NPO for now except meds and ice chips. Continue NS at 125 cc/hr. Follow electrolytes. Dilaudid for pain. I would not feed him yet.  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 No  indication for platelet transfusion. I do not suspect DIC/ITP/TTP. Monitor for bleeding and monitor daily CBC.  -CIWA protocol.   -rest per resident note. -SCD's.  The treatment plan was discussed in detail with the patient.  Alternatives to treatment, side effects, risks and benefits, and complications were discussed with the patient. Informed consent was obtained. The patient agrees to proceed with the current treatment plan.  Jonah Blue, DO 5/17/201410:14 AM

## 2012-08-07 NOTE — Plan of Care (Signed)
Problem: Phase III Progression Outcomes Goal: Tolerating diet Outcome: Progressing Patient tolerated ice chips while maintaining NPO status except ice chip status. Patient denied nausea but c/o of abdominal discomfort x3 during shift

## 2012-08-08 ENCOUNTER — Other Ambulatory Visit (HOSPITAL_COMMUNITY): Payer: Self-pay

## 2012-08-08 DIAGNOSIS — K766 Portal hypertension: Secondary | ICD-10-CM | POA: Diagnosis present

## 2012-08-08 DIAGNOSIS — F10939 Alcohol use, unspecified with withdrawal, unspecified: Secondary | ICD-10-CM | POA: Diagnosis not present

## 2012-08-08 DIAGNOSIS — F10231 Alcohol dependence with withdrawal delirium: Secondary | ICD-10-CM | POA: Diagnosis not present

## 2012-08-08 DIAGNOSIS — K7010 Alcoholic hepatitis without ascites: Secondary | ICD-10-CM | POA: Diagnosis present

## 2012-08-08 DIAGNOSIS — K449 Diaphragmatic hernia without obstruction or gangrene: Secondary | ICD-10-CM | POA: Diagnosis present

## 2012-08-08 DIAGNOSIS — R161 Splenomegaly, not elsewhere classified: Secondary | ICD-10-CM

## 2012-08-08 LAB — AMMONIA: Ammonia: 31 umol/L (ref 11–60)

## 2012-08-08 LAB — COMPREHENSIVE METABOLIC PANEL
BUN: 9 mg/dL (ref 6–23)
Calcium: 9.2 mg/dL (ref 8.4–10.5)
Creatinine, Ser: 0.48 mg/dL — ABNORMAL LOW (ref 0.50–1.35)
GFR calc Af Amer: >90 mL/min (ref >90)
GFR calc non Af Amer: >90 mL/min (ref >90)
Glucose, Bld: 111 mg/dL — ABNORMAL HIGH (ref 70–99)
Total Protein: 7.3 g/dL (ref 6.0–8.3)

## 2012-08-08 LAB — CBC WITH DIFFERENTIAL/PLATELET
Basophils Relative: 0 % (ref 0–1)
Eosinophils Absolute: 0 10*3/uL (ref 0.0–0.7)
MCH: 32 pg (ref 26.0–34.0)
MCHC: 35.2 g/dL (ref 30.0–36.0)
Neutrophils Relative %: 81 % — ABNORMAL HIGH (ref 43–77)
Platelets: 18 10*3/uL — CL (ref 150–400)

## 2012-08-08 MED ORDER — CHLORDIAZEPOXIDE HCL 25 MG PO CAPS
25.0000 mg | ORAL_CAPSULE | Freq: Once | ORAL | Status: AC
  Administered 2012-08-08: 25 mg via ORAL

## 2012-08-08 MED ORDER — DEXMEDETOMIDINE HCL IN NACL 200 MCG/50ML IV SOLN
0.2000 ug/kg/h | INTRAVENOUS | Status: DC
  Administered 2012-08-08: 0.7 ug/kg/h via INTRAVENOUS
  Administered 2012-08-08: 0.5 ug/kg/h via INTRAVENOUS
  Administered 2012-08-08 – 2012-08-09 (×3): 0.7 ug/kg/h via INTRAVENOUS
  Filled 2012-08-08 (×8): qty 50

## 2012-08-08 MED ORDER — HALOPERIDOL LACTATE 5 MG/ML IJ SOLN
INTRAMUSCULAR | Status: AC
  Filled 2012-08-08: qty 1

## 2012-08-08 MED ORDER — LORAZEPAM 1 MG PO TABS: 2.0000 mg | ORAL_TABLET | ORAL | Status: DC | PRN

## 2012-08-08 MED ORDER — FENTANYL CITRATE 0.05 MG/ML IJ SOLN
25.0000 ug | INTRAMUSCULAR | Status: DC | PRN
  Administered 2012-08-09: 25 ug via INTRAVENOUS
  Filled 2012-08-08: qty 2

## 2012-08-08 MED ORDER — ARTIFICIAL TEARS OP OINT
TOPICAL_OINTMENT | OPHTHALMIC | Status: DC | PRN
  Filled 2012-08-08: qty 3.5

## 2012-08-08 MED ORDER — VANCOMYCIN HCL IN DEXTROSE 1-5 GM/200ML-% IV SOLN
1000.0000 mg | Freq: Three times a day (TID) | INTRAVENOUS | Status: DC
  Administered 2012-08-08 – 2012-08-09 (×3): 1000 mg via INTRAVENOUS
  Filled 2012-08-08 (×5): qty 200

## 2012-08-08 MED ORDER — HALOPERIDOL LACTATE 5 MG/ML IJ SOLN
3.0000 mg | Freq: Once | INTRAMUSCULAR | Status: AC
  Administered 2012-08-08: 3 mg via INTRAVENOUS

## 2012-08-08 MED ORDER — LORAZEPAM 2 MG/ML IJ SOLN
1.0000 mg | INTRAMUSCULAR | Status: DC | PRN
  Administered 2012-08-08: 1 mg via INTRAVENOUS
  Filled 2012-08-08: qty 1

## 2012-08-08 MED ORDER — LORAZEPAM 2 MG/ML IJ SOLN
INTRAMUSCULAR | Status: AC
  Filled 2012-08-08: qty 1

## 2012-08-08 MED ORDER — LORAZEPAM 2 MG/ML IJ SOLN
1.0000 mg | INTRAMUSCULAR | Status: DC | PRN
  Administered 2012-08-08 – 2012-08-09 (×2): 2 mg via INTRAVENOUS
  Filled 2012-08-08 (×2): qty 1

## 2012-08-08 MED ORDER — LORAZEPAM 2 MG/ML IJ SOLN
2.0000 mg | Freq: Once | INTRAMUSCULAR | Status: AC
  Administered 2012-08-08: 2 mg via INTRAVENOUS

## 2012-08-08 MED ORDER — LABETALOL HCL 5 MG/ML IV SOLN
10.0000 mg | INTRAVENOUS | Status: AC | PRN
  Administered 2012-08-08 – 2012-08-10 (×10): 10 mg via INTRAVENOUS
  Filled 2012-08-08 (×8): qty 4

## 2012-08-08 MED ORDER — LORAZEPAM 1 MG PO TABS: 1.0000 mg | ORAL_TABLET | ORAL | Status: DC | PRN

## 2012-08-08 MED ORDER — SODIUM CHLORIDE 0.9 % IJ SOLN
INTRAMUSCULAR | Status: AC
  Filled 2012-08-08: qty 10

## 2012-08-08 MED ORDER — LORAZEPAM 2 MG/ML IJ SOLN: 2.0000 mg | INTRAMUSCULAR | Status: DC | PRN

## 2012-08-08 MED ORDER — CHLORDIAZEPOXIDE HCL 25 MG PO CAPS
25.0000 mg | ORAL_CAPSULE | ORAL | Status: DC | PRN
  Administered 2012-08-08: 50 mg via ORAL
  Filled 2012-08-08: qty 1
  Filled 2012-08-08: qty 2

## 2012-08-08 MED ORDER — PANTOPRAZOLE SODIUM 40 MG IV SOLR
40.0000 mg | Freq: Every day | INTRAVENOUS | Status: DC
  Administered 2012-08-08 – 2012-08-10 (×3): 40 mg via INTRAVENOUS
  Filled 2012-08-08 (×4): qty 40

## 2012-08-08 NOTE — Progress Notes (Signed)
Pt developing increased agitation, confused, anxiety, and tremors, with physical threats to staff. MD called and made aware. New consult to CCM was made.

## 2012-08-08 NOTE — Consult Note (Addendum)
PULMONARY  / CRITICAL CARE MEDICINE  Name: [name] MRN: [id number] DOB: [birth year]    ADMISSION DATE:  08/06/2012 CONSULTATION DATE:  08/08/12  REFERRING MD :  IM teaching service  PRIMARY SERVICE:    CHIEF COMPLAINT:  ETOH w/d   BRIEF PATIENT DESCRIPTION:  42 yo WM with known hx of ETOH and Anxiety admitted 5/16 with abd pain found to have acute pancreatitis, etoh hepatitis, thrombocytopenia  Pt w/ worsening ETOH w/d symptoms , CCM consulted 5/18 .   SIGNIFICANT EVENTS / STUDIES:  5/16 CT abd /pelvis >Subtle stranding about the tail of the pancreas suggestive of acute uncomplicated pancreatitis.   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 He states he drinks about a 12 pack of beer a day for years. He stated he was having generalized abdominal pain mostly over upper abdomen that did not improve with pepto bismol. He states he's had this pain before but it has usually gone away on its own. He has not been taking any of his medications. He admits to some recent bloody noses and easy bruising but no significant bleeding. He denies melena, hematochezia, hematuria.  He was noted to have evidence of alcoholic hepatitis on admission with AST 92 ALT 40, thrombocytopenia of 13k, and an alcohol level of 198. CT of the abdomen suggested pancreatitis as well as some lesions scattered throughout his liver that need follow up with MRI.      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 Has been to several detox and rehab programs in past without lasting sobriety.   No recent known injuries, or  falls. He has had worsening agitation, confusion and combativeness despite CIWA protocol. No improvement after multiple doses of ativan IV, and oral doses, and librium.   CCM consulted for consideration of precedex drip and assistance with ETOH w/d symptoms.   Pt has peroids of confusion .  He denies abd pain, +nausea. He is sitting up in bed eating clear liquid breakfast, Confusion to date and place. Agitated to stimulation . Combative to staff.   PAST MEDICAL HISTORY :  Past Medical History  Diagnosis Date  . DVT (deep venous thrombosis)   . Anxiety   . Alcoholic   . Hypertension    Past Surgical History  Procedure Laterality Date  . Tonsillectomy     Prior to Admission medications   Medication Sig Start Date End Date Taking? Authorizing Provider  ALPRAZolam Prudy Feeler) 0.5 MG tablet Take 0.25 mg by mouth at bedtime as needed for sleep or anxiety.   Yes Historical Provider, MD   No Known Allergies  FAMILY HISTORY:  Family History  Problem Relation Age of Onset  . Hypertension Father    SOCIAL HISTORY:  reports that he has quit smoking. His smoking use included Cigarettes. He smoked 0.00 packs per day. He does not have any smokeless tobacco history on file. He reports that he drinks about 7.2 ounces of alcohol per week. He reports that he uses illicit drugs.  REVIEW OF SYSTEMS:   Constitutional:   No  weight loss, night sweats,  Fevers,  chills,  +fatigue, or  lassitude.  HEENT:   No headaches,  Difficulty swallowing,  Tooth/dental problems, or  Sore throat,                No sneezing, itching, ear ache, nasal congestion, post nasal drip,   CV:  No chest pain,  Orthopnea, PND, swelling in lower extremities, anasarca, dizziness, palpitations, syncope.   GI  +indigestion, nausea,  No vomiting, diarrhea, change in bowel habits, loss of appetite, bloody stools.   Resp: No shortness of breath with exertion or at rest.  No excess mucus, no productive cough,  No non-productive  cough,  No coughing up of blood.  No change in color of mucus.  No wheezing.  No chest wall deformity  Skin: no rash or lesions.  GU: no dysuria, change in color of urine, no urgency or frequency.  No flank pain, no hematuria   MS:  No joint pain or swelling.  No decreased range of motion.  No back pain.  Psych:  No change in mood or affect. No depression or anxiety.  No memory loss.       SUBJECTIVE:  Increased agitation and confusion  " I am getting the shakes"   VITAL SIGNS: Temp:  [98.6 F (37 C)-99 F (37.2 C)] 98.9 F (37.2 C) (05/18 0416) Pulse Rate:  [76-97] 96 (05/18 0500) Resp:  [19-23] 22 (05/18 0622) BP: (159-175)/(94-99) 159/99 mmHg (05/18 0416) SpO2:  [95 %-99 %] 97 % (05/18 0416) Weight:  [80 kg (176 lb 5.9 oz)] 80 kg (176 lb 5.9 oz) (05/18 0416) HEMODYNAMICS:   VENTILATOR SETTINGS:   INTAKE / OUTPUT: Intake/Output     05/17 0701 - 05/18 0700 05/18 0701 - 05/19 0700   I.V. (mL/kg) 2875 (35.9)    IV Piggyback     Total Intake(mL/kg) 2875 (35.9)    Urine (mL/kg/hr) 850 (0.4)    Total Output 850     Net +2025          Urine Occurrence 1 x      PHYSICAL EXAMINATION: General:  Agitated ,  Neuro:  A/o x 1 , confused to place, date  HEENT:  Dry mucosa  Cardiovascular:  NSR , no m/r/g , no edema  Lungs:  Diminshed BS in bases  Abdomen:  Mild Distended, +hepatospleenomegaly, BS +, non tender  Musculoskeletal:  Intact, MAEWx 4  Skin:  Intact w/ no rash or breakdown.   LABS:  Recent Labs Lab 08/06/12 0934 08/06/12 1413 08/06/12 1414 08/06/12 1417 08/06/12 1648 08/06/12 2030 08/07/12 0221 08/08/12 0405  HGB 14.8 13.3  --   --   --   --  13.4 13.9  WBC 8.0 7.4  --   --   --   --  8.7 9.8  PLT 13* 11*  --   --   --   --  11* 18*  NA 132*  --   --   --   --   --  131* 133*  K 3.5  --   --   --   --   --  4.0 3.8  CL 93*  --   --   --   --   --  99 97  CO2 21  --   --   --   --   --  24 22  GLUCOSE 143*  --   --   --   --   --  129* 111*  BUN  3*  --   --   --   --   --  5* 9  CREATININE 0.44*  --   --   --   --   --  0.49* 0.48*  CALCIUM 9.2  --   --   --   --   --  8.7 9.2  MG  --  1.5  --   --   --   --  2.1 1.8  AST 92*  --   --   --   --   --   --  55*  ALT 40  --   --   --   --   --   --  27  ALKPHOS 131*  --   --   --   --   --   --  128*  BILITOT 2.0*  --   --   --   --   --   --  2.5*  PROT 7.9  --   --   --   --   --   --  7.3  ALBUMIN 3.4*  --   --   --   --   --   --  3.0*  APTT  --   --  38*  --   --   --   --   --   INR  --  1.28  --   --   --   --   --   --   LATICACIDVEN  --   --   --   --  1.9  --   --   --   TROPONINI  --   --   --  <0.30  --  <0.30 <0.30  --     Recent Labs Lab 08/06/12 1521  GLUCAP 135*    CXR: Hiatal hernia. No acute chest process.    ASSESSMENT / PLAN:  PULMONARY A: No apparent complication  P:   Monitor    CARDIOVASCULAR A: HTN  Neg cardiac enzymes , no evidence of shock -lactate nml  P:  Monitor  Add rx if b/p rises   RENAL A:  NO apparent complication w/ nml scr and good UOP  P:   Monitor   GASTROINTESTINAL A:  Acute Pancreatitis , evidence on CT , elevated lipase  ETOH Hepatitis with cirrhosis/thrombocytopenia  No evidence of active bleeding, INR nml , hepatitis viral panel neg   P:   Consider GI consult  NPO  IVF  Repeat amylase /lipase in am   HEMATOLOGIC A:  Thrombocytopenia secondary to Liver dsyfxn. /ETOH  History of DVT? Can't anticoagulate now due to low plt P:  SCD , no hep  Monitor closely  Check PLT count in citrated tube Transfuse PLT if bleeding or procedures and PLT count < 50K Clarify when DVT occurred when more awake  INFECTIOUS A:  No apparent active infections-afebrile with nml WBC and LA  P:   Monitor   ENDOCRINE A:  Hyperlipidemia , TG nml , TSH nml  P:   Cont to monitor    NEUROLOGIC A:  ETOH w/d , agitation/delirium without focal neurologic findings to suggest bleed P:   D/c Librium  Transfer to ICU  Begin  Precedex protocol  Ativan prn  Consider CT head if no improvement with appropriate treatment of DT's or worsening or focal neurofindings  TODAY'S SUMMARY:     PARRETT,TAMMY NP -C  Pulmonary and Critical Care Medicine [place] Pager: [phone]  08/08/2012, 8:52 AM  Attending:  I have seen and examined the patient with  nurse practitioner/resident and agree with the note above.   Supportive care with precedex for now. Monitor for bleeding.  NPO  I have personally obtained a history, examined the patient, evaluated laboratory and imaging results, formulated the assessment and plan and placed orders. CRITICAL CARE: The patient is critically ill with multiple organ systems failure and requires high complexity decision making for assessment and support, frequent evaluation and titration of therapies, application of advanced monitoring technologies and extensive interpretation of multiple databases. Critical Care Time devoted to patient care services described in this note is 60 minutes.   Yolonda Kida PCCM Pager: [phone] Cell: [phone] If no response, call [phone]

## 2012-08-08 NOTE — Progress Notes (Signed)
ANTIBIOTIC CONSULT NOTE - INITIAL  Pharmacy Consult for Vancomycin Indication: parotiditis  No Known Allergies  Patient Measurements: Height: 5\' 7"  (170.2 cm) Weight: 176 lb 5.9 oz (80 kg) IBW/kg (Calculated) : 66.1 Adjusted Body Weight: na  Vital Signs: Temp: 98.9 F (37.2 C) (05/18 1157) Temp src: Oral (05/18 1157) BP: 157/96 mmHg (05/18 1025) Pulse Rate: 110 (05/18 0729) Intake/Output from previous day: 05/17 0701 - 05/18 0700 In: 3000 [I.V.:3000] Out: 850 [Urine:850] Intake/Output from this shift: Total I/O In: 760 [P.O.:360; I.V.:400] Out: 550 [Urine:550]  Labs:  Recent Labs  08/06/12 0934 08/06/12 1413 08/07/12 0221 08/08/12 0405  WBC 8.0 7.4 8.7 9.8  HGB 14.8 13.3 13.4 13.9  PLT 13* 11* 11* 18*  CREATININE 0.44*  --  0.49* 0.48*   Estimated Creatinine Clearance: 120.7 ml/min (by C-G formula based on Cr of 0.48). No results found for this basename: VANCOTROUGH, Leodis Binet, VANCORANDOM, GENTTROUGH, GENTPEAK, GENTRANDOM, TOBRATROUGH, TOBRAPEAK, TOBRARND, AMIKACINPEAK, AMIKACINTROU, AMIKACIN,  in the last 72 hours   Microbiology: Recent Results (from the past 720 hour(s))  URINE CULTURE     Status: None   Collection Time    08/06/12  9:45 AM      Result Value Range Status   Specimen Description URINE, RANDOM   Final   Special Requests NONE   Final   Culture  Setup Time 08/06/2012 15:03   Final   Colony Count NO GROWTH   Final   Culture NO GROWTH   Final   Report Status 08/07/2012 FINAL   Final    Medical History: Past Medical History  Diagnosis Date  . DVT (deep venous thrombosis)   . Anxiety   . Alcoholic   . Hypertension     Medications:  Scheduled:  . folic acid  1 mg Oral Daily  . haloperidol lactate      . LORazepam      . LORazepam      . multivitamin with minerals  1 tablet Oral Daily  . pantoprazole  40 mg Oral Daily  . pneumococcal 23 valent vaccine  0.5 mL Intramuscular Tomorrow-1000  . sodium chloride  3 mL Intravenous Q12H  .  sodium chloride  3 mL Intravenous Q12H  . sodium chloride      . thiamine  100 mg Oral Daily   Or  . thiamine  100 mg Intravenous Daily   Assessment: 42 yo male with hx ETOH abuse and anxiety admitted 5/16 with acute pancreatitis, ETOH hepatitis and thrombocytopenia.  Also with tender, red, parotid swelling worrisome for parotiditis per CCM.  Pharmacy asked to begin empiric vancomycin.  Scr 0.48; estimated CrCl > 100.  Goal of Therapy:  Vancomycin trough level 15-20 mcg/ml  Plan:  1. Vancomycin 1g IV q 8 hrs.  Will check trough at steady state. 2. F/U renal function, cultures and clinical course.  Tad Moore, BCPS  Clinical Pharmacist Pager [phone]  08/08/2012 12:32 PM

## 2012-08-08 NOTE — Progress Notes (Signed)
Pt transferred to 2108 per MD order. Report called to receiving nurse and all questions answered.

## 2012-08-08 NOTE — Progress Notes (Addendum)
Subjective: Nurse reports s/s alcohol withdrawal not responding to Librium or ativan.  Patient is agitated, hostile to staff. He denies nausea or abdominal pain this morning.   Objective: Vital signs in last 24 hours: Filed Vitals:   08/08/12 0227 08/08/12 0416 08/08/12 0500 08/08/12 0622  BP: 172/97 159/99    Pulse: 97 85 96   Temp:  98.9 F (37.2 C)    TempSrc:  Oral    Resp: 23 22  22   Height:      Weight:  176 lb 5.9 oz (80 kg)    SpO2: 99% 97%     Weight change: 1 lb 5.9 oz (0.621 kg)  Intake/Output Summary (Last 24 hours) at 08/08/12 0745 Last data filed at 08/08/12 0600  Gross per 24 hour  Intake   2875 ml  Output    850 ml  Net   2025 ml   Physical Exam  General: agitated. Neck: No JVD Lungs: CTA B/L Heart: Tachycardia. No G/M/R Abd: Soft. Non tenderness. No rebound tenderness or guarding. No ascites,  positive BS.  Ext: NO edema. 2+ DP B/L.  Neurology: confused at times. Agitated. CN II - XII are grossly intact. Motor strength is 5/5 in the all 4 extremities, Sensations intact to light touch, Cerebellar signs negative.    Lab Results: Basic Metabolic Panel:  Recent Labs Lab 08/07/12 0221 08/08/12 0405  Angeligue Bowne 131* 133*  K 4.0 3.8  CL 99 97  CO2 24 22  GLUCOSE 129* 111*  BUN 5* 9  CREATININE 0.49* 0.48*  CALCIUM 8.7 9.2  MG 2.1 1.8   Liver Function Tests:  Recent Labs Lab 08/06/12 0934 08/08/12 0405  AST 92* 55*  ALT 40 27  ALKPHOS 131* 128*  BILITOT 2.0* 2.5*  PROT 7.9 7.3  ALBUMIN 3.4* 3.0*    Recent Labs Lab 08/06/12 0934  LIPASE 146*   CBC:  Recent Labs Lab 08/06/12 0934  08/07/12 0221 08/08/12 0405  WBC 8.0  < > 8.7 9.8  NEUTROABS 6.5  --   --  7.9*  HGB 14.8  < > 13.4 13.9  HCT 40.5  < > 37.9* 39.5  MCV 89.2  < > 90.5 90.8  PLT 13*  < > 11* 18*  < > = values in this interval not displayed. Cardiac Enzymes:  Recent Labs Lab 08/06/12 1417 08/06/12 2030 08/07/12 0221  TROPONINI <0.30 <0.30 <0.30   CBG:  Recent  Labs Lab 08/06/12 1521  GLUCAP 135*   Fasting Lipid Panel:  Recent Labs Lab 08/06/12 1650  CHOL 259*  HDL 50  LDLCALC 187*  TRIG 109  CHOLHDL 5.2   Thyroid Function Tests:  Recent Labs Lab 08/07/12 0815  TSH 2.130   Coagulation:  Recent Labs Lab 08/06/12 1413  LABPROT 15.7*  INR 1.28   Urine Drug Screen: Drugs of Abuse     Component Value Date/Time   LABOPIA NONE DETECTED 12/21/2008 1123   LABOPIA NEGATIVE 06/19/2008 0624   COCAINSCRNUR NONE DETECTED 12/21/2008 1123   COCAINSCRNUR NEGATIVE 06/19/2008 0624   LABBENZ NONE DETECTED 12/21/2008 1123   LABBENZ  Value: POSITIVE (NOTE) Result repeated and verified. Sent for confirmatory testing* 06/19/2008 0624   AMPHETMU NONE DETECTED 12/21/2008 1123   AMPHETMU NEGATIVE 06/19/2008 0624   THCU NONE DETECTED 12/21/2008 1123   LABBARB  Value: NONE DETECTED        DRUG SCREEN FOR MEDICAL PURPOSES ONLY.  IF CONFIRMATION IS NEEDED FOR ANY PURPOSE, NOTIFY LAB WITHIN 5 DAYS.  LOWEST DETECTABLE LIMITS FOR URINE DRUG SCREEN Drug Class       Cutoff (ng/mL) Amphetamine      1000 Barbiturate      200 Benzodiazepine   200 Tricyclics       300 Opiates          300 Cocaine          300 THC              50 12/21/2008 1123    Alcohol Level:  Recent Labs Lab 08/06/12 0934  ETH 198*   Urinalysis:  Recent Labs Lab 08/06/12 0945  COLORURINE YELLOW  LABSPEC 1.004*  PHURINE 6.5  GLUCOSEU 100*  HGBUR SMALL*  BILIRUBINUR NEGATIVE  KETONESUR NEGATIVE  PROTEINUR 100*  UROBILINOGEN 1.0  NITRITE NEGATIVE  LEUKOCYTESUR NEGATIVE    Micro Results: Recent Results (from the past 240 hour(s))  URINE CULTURE     Status: None   Collection Time    08/06/12  9:45 AM      Result Value Range Status   Specimen Description URINE, RANDOM   Final   Special Requests NONE   Final   Culture  Setup Time 08/06/2012 15:03   Final   Colony Count NO GROWTH   Final   Culture NO GROWTH   Final   Report Status 08/07/2012 FINAL   Final    Studies/Results: Dg Chest 2 View  08/06/2012   *RADIOLOGY REPORT*  Clinical Data: Abdominal pain, hypertension  CHEST - 2 VIEW  Comparison: 12/21/2008  Findings: Stable heart size and vascularity.  No focal pneumonia, collapse, consolidation, edema pattern, effusion or pneumothorax. Trachea midline.  Hiatal hernia noted with an air-fluid level.  IMPRESSION: Hiatal hernia.  No acute chest process.   Original Report Authenticated By: Judie Petit. Miles Costain, M.D.   Ct Abdomen Pelvis W Contrast  08/06/2012   *RADIOLOGY REPORT*  Clinical Data: Crampy abdominal pain across the lower abdomen, nausea and vomiting  CT ABDOMEN AND PELVIS WITH CONTRAST  Technique:  Multidetector CT imaging of the abdomen and pelvis was performed following the standard protocol during bolus administration of intravenous contrast.  Contrast: 80mL OMNIPAQUE IOHEXOL 300 MG/ML  SOLN  Comparison: None.  Findings:  Stigmata of cirrhosis with nodularity of the hepatic contour. Findings suggestive of portal venous hypertension with recanalization of the periumbilical vein and splenomegaly, with the spleen measuring approximately 17 cm in length (image 28, series 2).  The portal vein is patent. There are no definitive gastric varices identified on this examination.  There are multiple hypoattenuating lesions scattered throughout the liver with dominant lesion within the inferior aspect of the right lobe of liver measuring 8 mm in diameter (image 7, series 5, too small to accurately characterize.   Normal appearance of the gallbladder. No intra or extrahepatic biliary duct dilatation.  No ascites.  There is a minimal amount of stranding about the tail of the pancreas (image 38 and 41, series 2 which will nonspecific may be indicative of acute uncomplicated pancreatitis.  There is homogeneous enhancement of the pancreatic parenchyma.  No definite pancreatic duct dilatation on this non pancreatic protocol CT.  There is symmetric enhancement and excretion of the  bilateral kidneys.  Note is made of a slightly horizontal lie of the left kidney, likely secondary to mass effect from the enlarged spleen. No discrete renal lesions.  No definite renal stones in the postcontrast examination.  No urinary obstruction. Normal appearance of the bilateral adrenal glands.  Moderate to large sized hiatal hernia.  Ingested enteric contrast extends to the level of the rectum.  No evidence of enteric obstruction.  The bowel is normal in course and caliber without wall thickening or evidence of obstruction.  Normal appearance of the appendix.  No pneumoperitoneum, pneumatosis or portal venous gas.  Limited visualization of the lower thorax is negative for focal airspace opacity or pleural effusion.  Normal heart size.  No pericardial effusion.  No acute or aggressive osseous abnormalities.  IMPRESSION:  1.  Subtle stranding about the tail of the pancreas suggestive of acute uncomplicated pancreatitis.  Correlation with amylase and lipase is recommended. 2.  Stigmata of cirrhosis and portal venous hypertension as above. 3.  There are multiple hypoattenuating lesions scattered throughout the liver which are too small to accurately characterize.  Given stigmata of cirrhosis, further evaluation for hepatoma may be obtained with non emergent abdominal MRI as clinically indicated.  4.  Moderate to large size hiatal hernia.   Original Report Authenticated By: Tacey Ruiz, MD   Medications: I have reviewed the patient's current medications. Scheduled Meds: . folic acid  1 mg Oral Daily  . LORazepam      . LORazepam      . multivitamin with minerals  1 tablet Oral Daily  . pantoprazole  40 mg Oral Daily  . pneumococcal 23 valent vaccine  0.5 mL Intramuscular Tomorrow-1000  . sodium chloride  3 mL Intravenous Q12H  . sodium chloride  3 mL Intravenous Q12H  . thiamine  100 mg Oral Daily   Or  . thiamine  100 mg Intravenous Daily   Continuous Infusions: . sodium chloride 125 mL/hr at  08/08/12 0425   PRN Meds:.sodium chloride, chlordiazePOXIDE, HYDROmorphone (DILAUDID) injection, ondansetron (ZOFRAN) IV, ondansetron, sodium chloride Assessment/Plan:  1. Severe alcohol withdrawal in the history of alcoholism    # Delirium Tremens  Excessive alcohol abuse for years with last drink 48 hours ago. Patient has had withdraw symptoms since admission with CIWA scores > 20, which refractory to BDZ PRN ( ativan and librium)  Nursing staff reports that he has been disoriented, agitated, paranoid at times overnight. He is noted to have tachycardia, hypertensive and diaphoresis.   A/P The clinical manifestation is consistent with the alcohol withdrawal. CNS infection is unlikely since he does not have meningism, nuchal rigidity, kernig sign or brudzinski sign. Acute liver failure unlikely given stable LFT GIB unlikely given no s/s bleeding  - will obtain ammonia level - will consider head CT ( the exam requires patient's cooperation)   - Consult PCCM for management of severe alcohol withdrawal.  - PCCM will transfer to ICU  - Family called and updated his condition. All questions answered.   2. Acute uncomplicated pancreatitis- confirmed on Abd CT scan.   - Advance to clear liquid - continue IVF- NS 125 -continue pain management-dilaudid q6 prn pain  - continue symptomatic management -zofran prn   3. Elevated AG gap-closing - likely repsents alcoholic ketoacidosis, lactic acid negative  4. Liver cirrhosis- Child-Pugh grade B  # Portal hypertension  # splenomegaly  # Thrombocytopenia    - NO gastric varices or ascites noted on abd CT - One- and two-year patient survival 80% and 60%.   5. Alcoholic hepatitis:  - hepatitis panel is negative for Hepatitis ABC --Hepatitis discriminant Fx 69.Glascow ETOH Hep. Score is 5 -Among patients with an mDF> 32, there was no appreciable benefit from treatment with corticosteroids in patient with a GAHS < 9  6. Hiatal  hernia --Incidental  findings of Moderate to large size hiatal hernia on abd CT.   7. VTE: SCD  Dispo: Disposition is deferred at this time, awaiting improvement of current medical problems.  Anticipated discharge in approximately 2-3 day(s).   The patient does not have a current PCP (No primary provider on file.), therefore is requiring OPC follow-up after discharge.   The patient does not have transportation limitations that hinder transportation to clinic appointments.  .Services Needed at time of discharge: Y = Yes, Blank = No PT:   OT:   RN:   Equipment:   Other:     LOS: 2 days   [name] 08/08/2012, 7:45 AM

## 2012-08-08 NOTE — Progress Notes (Signed)
[facility] PCCM  Asked to evaluate [name] facial swelling:  Has tender, red parotid swelling, worrisome for parotiditis. No airway obstruction  Start vanc Serial exams  Yolonda Kida PCCM Pager: [phone] Cell: [phone] If no response, call [phone]

## 2012-08-09 DIAGNOSIS — I82409 Acute embolism and thrombosis of unspecified deep veins of unspecified lower extremity: Secondary | ICD-10-CM

## 2012-08-09 DIAGNOSIS — G9340 Encephalopathy, unspecified: Secondary | ICD-10-CM | POA: Diagnosis present

## 2012-08-09 LAB — COMPREHENSIVE METABOLIC PANEL
AST: 50 U/L — ABNORMAL HIGH (ref 0–37)
Albumin: 2.9 g/dL — ABNORMAL LOW (ref 3.5–5.2)
BUN: 8 mg/dL (ref 6–23)
CO2: 25 mEq/L (ref 19–32)
Calcium: 9.2 mg/dL (ref 8.4–10.5)
Chloride: 102 mEq/L (ref 96–112)
Creatinine, Ser: 0.59 mg/dL (ref 0.50–1.35)
GFR calc non Af Amer: >90 mL/min (ref >90)
Total Bilirubin: 1.9 mg/dL — ABNORMAL HIGH (ref 0.3–1.2)

## 2012-08-09 LAB — AMYLASE: Amylase: 53 U/L (ref 0–105)

## 2012-08-09 LAB — CBC WITH DIFFERENTIAL/PLATELET
Basophils Absolute: 0 10*3/uL (ref 0.0–0.1)
Basophils Relative: 0 % (ref 0–1)
Eosinophils Relative: 1 % (ref 0–5)
HCT: 40.6 % (ref 39.0–52.0)
Hemoglobin: 14.4 g/dL (ref 13.0–17.0)
MCH: 32.1 pg (ref 26.0–34.0)
MCHC: 35.5 g/dL (ref 30.0–36.0)
MCV: 90.4 fL (ref 78.0–100.0)
Monocytes Absolute: 0.8 10*3/uL (ref 0.1–1.0)
Monocytes Relative: 17 % — ABNORMAL HIGH (ref 3–12)
Neutro Abs: 3.6 10*3/uL (ref 1.7–7.7)
RDW: 14.6 % (ref 11.5–15.5)

## 2012-08-09 LAB — LIPASE, BLOOD: Lipase: 24 U/L (ref 11–59)

## 2012-08-09 LAB — PROTIME-INR: Prothrombin Time: 17 seconds — ABNORMAL HIGH (ref 11.6–15.2)

## 2012-08-09 MED ORDER — LORAZEPAM 2 MG/ML IJ SOLN
INTRAMUSCULAR | Status: AC
  Filled 2012-08-09: qty 2

## 2012-08-09 MED ORDER — DEXMEDETOMIDINE HCL IN NACL 400 MCG/100ML IV SOLN
0.4000 ug/kg/h | INTRAVENOUS | Status: DC
  Administered 2012-08-09: 1 ug/kg/h via INTRAVENOUS
  Administered 2012-08-09 – 2012-08-10 (×3): 1.2 ug/kg/h via INTRAVENOUS
  Administered 2012-08-10: 0.9 ug/kg/h via INTRAVENOUS
  Administered 2012-08-10 – 2012-08-12 (×8): 1.2 ug/kg/h via INTRAVENOUS
  Administered 2012-08-12: 0.2 ug/kg/h via INTRAVENOUS
  Administered 2012-08-12: 1.2 ug/kg/h via INTRAVENOUS
  Administered 2012-08-12: 1 ug/kg/h via INTRAVENOUS
  Administered 2012-08-12: 1.2 ug/kg/h via INTRAVENOUS
  Filled 2012-08-09 (×19): qty 100

## 2012-08-09 MED ORDER — MAGNESIUM SULFATE 50 % IJ SOLN
2.0000 g | Freq: Once | INTRAVENOUS | Status: AC
  Administered 2012-08-09: 2 g via INTRAVENOUS
  Filled 2012-08-09: qty 4

## 2012-08-09 MED ORDER — LORAZEPAM 2 MG/ML IJ SOLN
1.0000 mg | INTRAMUSCULAR | Status: DC | PRN
  Administered 2012-08-09: 2 mg via INTRAVENOUS
  Administered 2012-08-10: 4 mg via INTRAVENOUS
  Administered 2012-08-10 (×3): 2 mg via INTRAVENOUS
  Administered 2012-08-10: 4 mg via INTRAVENOUS
  Administered 2012-08-10 – 2012-08-11 (×3): 2 mg via INTRAVENOUS
  Administered 2012-08-11 (×3): 4 mg via INTRAVENOUS
  Administered 2012-08-11 – 2012-08-12 (×3): 2 mg via INTRAVENOUS
  Administered 2012-08-12 (×2): 4 mg via INTRAVENOUS
  Filled 2012-08-09 (×3): qty 1
  Filled 2012-08-09 (×2): qty 2
  Filled 2012-08-09: qty 1
  Filled 2012-08-09: qty 2
  Filled 2012-08-09 (×2): qty 1
  Filled 2012-08-09 (×4): qty 2
  Filled 2012-08-09 (×5): qty 1

## 2012-08-09 MED ORDER — BIOTENE DRY MOUTH MT LIQD
15.0000 mL | Freq: Two times a day (BID) | OROMUCOSAL | Status: DC
  Administered 2012-08-09 – 2012-08-16 (×11): 15 mL via OROMUCOSAL

## 2012-08-09 MED ORDER — LACTULOSE ENEMA
300.0000 mL | Freq: Two times a day (BID) | ORAL | Status: DC
  Filled 2012-08-09 (×4): qty 300

## 2012-08-09 MED ORDER — SODIUM CHLORIDE 0.45 % IV SOLN
INTRAVENOUS | Status: DC
  Administered 2012-08-09 – 2012-08-10 (×3): via INTRAVENOUS
  Filled 2012-08-09 (×5): qty 1000

## 2012-08-09 MED ORDER — CHLORHEXIDINE GLUCONATE 0.12 % MT SOLN
OROMUCOSAL | Status: AC
  Administered 2012-08-09: 15 mL via OROMUCOSAL
  Filled 2012-08-09: qty 15

## 2012-08-09 MED ORDER — DEXMEDETOMIDINE HCL IN NACL 200 MCG/50ML IV SOLN
0.4000 ug/kg/h | INTRAVENOUS | Status: DC
  Administered 2012-08-09: 0.8 ug/kg/h via INTRAVENOUS
  Administered 2012-08-09: 1.2 ug/kg/h via INTRAVENOUS
  Filled 2012-08-09: qty 50

## 2012-08-09 MED ORDER — LORAZEPAM 2 MG/ML IJ SOLN
4.0000 mg | Freq: Once | INTRAMUSCULAR | Status: AC
  Administered 2012-08-09: 4 mg via INTRAVENOUS

## 2012-08-09 MED ORDER — VANCOMYCIN HCL IN DEXTROSE 1-5 GM/200ML-% IV SOLN
1000.0000 mg | Freq: Three times a day (TID) | INTRAVENOUS | Status: DC
  Administered 2012-08-09 – 2012-08-10 (×3): 1000 mg via INTRAVENOUS
  Filled 2012-08-09 (×5): qty 200

## 2012-08-09 MED ORDER — CHLORHEXIDINE GLUCONATE 0.12 % MT SOLN
15.0000 mL | Freq: Two times a day (BID) | OROMUCOSAL | Status: DC
  Administered 2012-08-09 – 2012-08-12 (×4): 15 mL via OROMUCOSAL
  Filled 2012-08-09 (×3): qty 15

## 2012-08-09 NOTE — Progress Notes (Signed)
Unit CM UR Completed by [facility] ED CM  W. Khyrin Trevathan RN  

## 2012-08-09 NOTE — Progress Notes (Signed)
Clinical Social Worker received referral for "Substance Abuse".  Pt currently asleep-CSW to attempt intervention when pt able to fully participate.    Bryton Waight, MSW, LCSWA [phone] 

## 2012-08-09 NOTE — Progress Notes (Signed)
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 He states he drinks about a 12 pack of beer a day for years. He stated he was having generalized abdominal pain mostly over upper abdomen that did not improve with pepto bismol. He states he's had this pain before but it has usually gone away on its own. He has not been taking any of his medications. He admits to some recent bloody noses and easy bruising but no significant bleeding. He denies melena, hematochezia, hematuria.  He was noted to have evidence of alcoholic hepatitis on admission with AST 92 ALT 40, thrombocytopenia of 13k, and an alcohol level of 198. CT of the abdomen suggested pancreatitis as well as some lesions scattered throughout his liver that need follow up with MRI.      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 Has been to several detox and rehab programs in past without lasting sobriety.   No recent known injuries, or falls. He has had worsening agitation, confusion and combativeness despite CIWA protocol. No improvement after multiple doses of ativan IV, and oral doses, and librium.   CCM consulted for consideration of precedex drip and assistance with ETOH w/d symptoms.   Pt has peroids of confusion .  He denies abd pain, +nausea. He is sitting up in bed eating clear liquid breakfast, Confusion to date and place. Agitated to stimulation . Combative to staff.     LINES / TUBES:   CULTURES:   ANTIBIOTICS:    SIGNIFICANT EVENTS / STUDIES:  5/16 CT abd /pelvis >Subtle stranding about the tail of the pancreas  suggestive of acute uncomplicated pancreatitis.   Stigmata of cirrhosis and portal venous hypertension, multiple hypoattenuating lesions scattered throughout  the liver  >stigmata of cirrhosis 5/18 Transferred to ICU precedex drip     SUBJECTIVE/OVERNIGHT/INTERVAL HX 08/09/2012: Continues on Precedex infusion but slightly more agitated since 9:30 AM. Requiring intermittent Ativan. Fianc and 2 friends or the bedside and per nursing more agitated after that presents. Required 4 mg Ativan to calm him down    VITAL SIGNS: Temp:  [97.5 F (36.4 C)-98.8 F (37.1 C)] 97.5 F (36.4 C) (05/19 0813) Pulse Rate:  [69-100] 69 (05/19 1100) Resp:  [10-18] 15 (05/19 1100) BP: (139-193)/(91-115) 149/115 mmHg (05/19 1100) SpO2:  [96 %-100 %] 100 % (05/19 1100) HEMODYNAMICS:   VENTILATOR SETTINGS:   INTAKE / OUTPUT: Intake/Output     05/18 0701 - 05/19 0700 05/19 0701 - 05/20 0700   P.O. 360    I.V. (mL/kg) 2201.5 (27.5) 358.6 (4.5)   IV Piggyback 600    Total Intake(mL/kg) 3161.5 (39.5) 358.6 (4.5)   Urine (mL/kg/hr) 4100 (2.1)    Total Output 4100     Net -938.5 +358.6          PHYSICAL EXAMINATION: General: Critically ill looking male on the bed in the ICU. No ventilator Neuro:  A/o x 1 , confused to place, date . Intermittently agitated with a RASS sedation scor was 3 to +4 but comes with Ativan. This is  despite Precedex infusion HEENT:  Dry mucosa  Cardiovascular:  NSR , no m/r/g , no edema  Lungs:  Diminshed BS in bases  Abdomen:  Mild Distended, +hepatospleenomegaly, BS +, non tender  Musculoskeletal:  Intact, MAEWx 4  Skin:  Intact w/ no rash or breakdown.   LABS: - See individual assessment and plan   Imaging x48 hours No results found.   ASSESSMENT / PLAN:  PULMONARY No results found for this basename: PHART, PCO2, PCO2ART, PO2, PO2ART, HCO3, TCO2, O2SAT,  in the last 168 hours  A: No apparent complication and currently protecting airway but he is at risk for  intubation because of ongoing delirium P:   Monitor    CARDIOVASCULAR No results found for this basename: PROBNP,  in the last 168 hours   Recent Labs Lab 08/06/12 1417 08/06/12 2030 08/07/12 0221  TROPONINI <0.30 <0.30 <0.30    Recent Labs Lab 08/06/12 1648  LATICACIDVEN 1.9     A: HTN  Neg cardiac enzymes , no evidence of shock -lactate nml  P:  Monitor  Add rx if b/p rises   RENAL  Recent Labs Lab 08/06/12 0934 08/06/12 1413 08/07/12 0221 08/08/12 0405 08/09/12 0400  NA 132*  --  131* 133* 138  K 3.5  --  4.0 3.8 3.4*  CL 93*  --  99 97 102  CO2 21  --  24 22 25   GLUCOSE 143*  --  129* 111* 115*  BUN 3*  --  5* 9 8  CREATININE 0.44*  --  0.49* 0.48* 0.59  CALCIUM 9.2  --  8.7 9.2 9.2  MG  --  1.5 2.1 1.8 1.8  PHOS  --   --   --  2.6 4.6   Intake/Output     05/18 0701 - 05/19 0700 05/19 0701 - 05/20 0700   P.O. 360    I.V. (mL/kg) 2201.5 (27.5) 358.6 (4.5)   IV Piggyback 600    Total Intake(mL/kg) 3161.5 (39.5) 358.6 (4.5)   Urine (mL/kg/hr) 4100 (2.1)    Total Output 4100     Net -938.5 +358.6          A:  NO apparent complication w/ nml scr and good UOP but mild hypokalemia and mild hypomagnesemia P:   2 g IV magnesium sulfate  GASTROINTESTINAL  Recent Labs Lab 08/06/12 0934 08/06/12 1413 08/08/12 0405 08/09/12 0400  AST 92*  --  55* 50*  ALT 40  --  27 29  ALKPHOS 131*  --  128* 131*  BILITOT 2.0*  --  2.5* 1.9*  PROT 7.9  --  7.3 7.1  ALBUMIN 3.4*  --  3.0* 2.9*  INR  --  1.28  --  1.42    A:  Acute Pancreatitis , evidence on CT , elevated lipase  ETOH Hepatitis with cirrhosis/thrombocytopenia  No evidence of active bleeding, INR nml , hepatitis viral panel neg   - 08/09/2012: Lipase has normalized. Abdominal exam is nonfocal  P:   Depending on course Consider GI consult  NPO DUE to mental status IVF    HEMATOLOGIC  Recent Labs Lab 08/07/12 0221 08/08/12 0405 08/09/12 0400  HGB 13.4 13.9 14.4  HCT 37.9* 39.5  40.6  WBC 8.7 9.8 5.0  PLT 11* 18* 19*    Recent Labs Lab 08/06/12 1413 08/09/12 0400  INR 1.28 1.42    A:  Thrombocytopenia secondary to Liver dsyfxn. /ETOH  History of DVT? Can't anticoagulate now due to low plt -  08/09/2012: Improving platelet count P:  SCD , no hep  Monitor closely  Check PLT count in citrated tube Transfuse PLT if bleeding or procedures and PLT count < 50K. otherwise sinuses are less than 10,000 Clarify when DVT occurred when more awake    INFECTIOUS No results found for this basename: PROCALCITON,  in the last 168 hours  Recent Labs Lab 08/06/12 1648  LATICACIDVEN 1.9     A:  No apparent active infections-afebrile with nml WBC and LA  P:   Monitor   ENDOCRINE A:  Hyperlipidemia , TG nml , TSH nml  P:   Cont to monitor    NEUROLOGIC A:  ETOH w/d , agitation/delirium without focal neurologic findings to suggest bleed  - 08/09/2012: Has intermittent agitation despite being on Precedex infusion. Concern that he might have hepatic encephalopathy  P:   Continue Precedex since 08/08/2012 Increase the tempo of Ativan when necessary Start lactulose enema  Global 08/09/2012: Steffanie [place] updated at the bedside  The patient is critically ill with multiple organ systems failure and requires high complexity decision making for assessment and support, frequent evaluation and titration of therapies, application of advanced monitoring technologies and extensive interpretation of multiple databases.   Critical Care Time devoted to patient care services described in this note is  35  Minutes.  Dr. Kalman Shan, M.D., [facility].C.P Pulmonary and Critical Care Medicine Staff Physician [facility] Pager: [phone], If no answer or between  15:00h - 7:00h: call [phone]  08/09/2012 1:12 PM

## 2012-08-10 ENCOUNTER — Inpatient Hospital Stay (HOSPITAL_COMMUNITY): Payer: MEDICAID

## 2012-08-10 DIAGNOSIS — G9340 Encephalopathy, unspecified: Secondary | ICD-10-CM

## 2012-08-10 LAB — CBC WITH DIFFERENTIAL/PLATELET
Eosinophils Absolute: 0.1 10*3/uL (ref 0.0–0.7)
Eosinophils Relative: 2 % (ref 0–5)
HCT: 43.2 % (ref 39.0–52.0)
Hemoglobin: 15.5 g/dL (ref 13.0–17.0)
Lymphocytes Relative: 10 % — ABNORMAL LOW (ref 12–46)
Lymphs Abs: 0.6 10*3/uL — ABNORMAL LOW (ref 0.7–4.0)
MCH: 32.6 pg (ref 26.0–34.0)
MCV: 90.9 fL (ref 78.0–100.0)
Monocytes Absolute: 1.2 10*3/uL — ABNORMAL HIGH (ref 0.1–1.0)
Monocytes Relative: 20 % — ABNORMAL HIGH (ref 3–12)
Platelets: 30 10*3/uL — ABNORMAL LOW (ref 150–400)
RBC: 4.75 MIL/uL (ref 4.22–5.81)

## 2012-08-10 LAB — COMPREHENSIVE METABOLIC PANEL
ALT: 28 U/L (ref 0–53)
Alkaline Phosphatase: 128 U/L — ABNORMAL HIGH (ref 39–117)
BUN: 10 mg/dL (ref 6–23)
CO2: 22 mEq/L (ref 19–32)
Calcium: 9.1 mg/dL (ref 8.4–10.5)
Creatinine, Ser: 0.64 mg/dL (ref 0.50–1.35)
Glucose, Bld: 129 mg/dL — ABNORMAL HIGH (ref 70–99)
Total Bilirubin: 2.1 mg/dL — ABNORMAL HIGH (ref 0.3–1.2)
Total Protein: 6.9 g/dL (ref 6.0–8.3)

## 2012-08-10 LAB — LACTIC ACID, PLASMA: Lactic Acid, Venous: 1.5 mmol/L (ref 0.5–2.2)

## 2012-08-10 LAB — PROTIME-INR: Prothrombin Time: 16.6 seconds — ABNORMAL HIGH (ref 11.6–15.2)

## 2012-08-10 MED ORDER — PIPERACILLIN-TAZOBACTAM 3.375 G IVPB
3.3750 g | Freq: Three times a day (TID) | INTRAVENOUS | Status: DC
  Administered 2012-08-10 – 2012-08-13 (×9): 3.375 g via INTRAVENOUS
  Filled 2012-08-10 (×12): qty 50

## 2012-08-10 MED ORDER — OSMOLITE 1.2 CAL PO LIQD
1000.0000 mL | ORAL | Status: DC
  Administered 2012-08-10 – 2012-08-11 (×2): 1000 mL
  Filled 2012-08-10 (×6): qty 1000

## 2012-08-10 MED ORDER — HYDRALAZINE HCL 20 MG/ML IJ SOLN: 10.0000 mg | INTRAMUSCULAR | Status: DC | PRN

## 2012-08-10 MED ORDER — VANCOMYCIN HCL IN DEXTROSE 1-5 GM/200ML-% IV SOLN
1000.0000 mg | Freq: Four times a day (QID) | INTRAVENOUS | Status: DC
  Administered 2012-08-10 – 2012-08-11 (×4): 1000 mg via INTRAVENOUS
  Filled 2012-08-10 (×6): qty 200

## 2012-08-10 MED ORDER — LACTULOSE 10 GM/15ML PO SOLN
10.0000 g | Freq: Three times a day (TID) | ORAL | Status: DC
  Administered 2012-08-10 – 2012-08-11 (×3): 10 g via ORAL
  Filled 2012-08-10 (×6): qty 15

## 2012-08-10 MED ORDER — MAGNESIUM SULFATE 40 MG/ML IJ SOLN
2.0000 g | Freq: Once | INTRAMUSCULAR | Status: AC
  Administered 2012-08-10: 2 g via INTRAVENOUS
  Filled 2012-08-10: qty 50

## 2012-08-10 NOTE — Progress Notes (Signed)
[facility] ADULT ICU REPLACEMENT PROTOCOL FOR AM LAB REPLACEMENT ONLY  The patient does apply for the [facility] Adult ICU Electrolyte Replacment Protocol based on the criteria listed below:   1. Is GFR >/= 40 ml/min? yes  Patient's GFR today is >90 2. Is urine output >/= 0.5 ml/kg/hr for the last 6 hours? yes Patient's UOP is 1.5 ml/kg/hr 3. Is BUN < 60 mg/dL? yes  Patient's BUN today is 10 4. Abnormal electrolyte Mg 1.7 5. Ordered repletion with:per protocol 6. If a panic level lab has been reported, has the CCM MD in charge been notified? yes.   Physician:  Dr Bea Laura Deterding  [place] 08/10/2012 6:03 AM

## 2012-08-10 NOTE — Progress Notes (Signed)
INITIAL NUTRITION ASSESSMENT  DOCUMENTATION CODES Per approved criteria  -Not Applicable   INTERVENTION:  Initiate TF via NGT with Osmolite 1.2 at 20 ml/h, increase by 10 ml every 4 hours to goal rate of 70 ml/h to provide 2016 kcals, 93 gm protein, 1378 ml free water daily.  NUTRITION DIAGNOSIS: Inadequate oral intake related to inability to eat with altered mental status as evidenced by NPO status.   Goal: Intake to meet >90% of estimated nutrition needs.  Monitor:  TF tolerance/adequacy, weight trend, labs, ability to initiate PO diet.  Reason for Assessment: MD Consult for TF initiation and management.  42 y.o. male  Admitting Dx: Acute pancreatitis  ASSESSMENT: Patient presented with abdominal pain on 08/06/12 . He stated on admission that he drinks about a 12 pack of beer a day for years. Transferred to the ICU on 5/18 for ETOH withdrawal, given Precedex. Patient with continued agitation and delirium; continues to receive Precedex. Not alert enough to take PO's. Per discussion with RN, patient diagnosed with alcoholic liver disease this admission. NGT was placed by RN this morning. Received MD Consult for TF initiation and management.   Abbreviated physical exam completed.  No muscle or subcutaneous fat depletion noticed. Per discussion with fiancee, patient had poor food choices PTA, but weight was stable.  Height: Ht Readings from Last 1 Encounters:  08/06/12 5\' 7"  (1.702 m)    Weight: Wt Readings from Last 1 Encounters:  08/10/12 166 lb 7.2 oz (75.5 kg)    Ideal Body Weight: 67.3 kg  % Ideal Body Weight: 112%  Wt Readings from Last 10 Encounters:  08/10/12 166 lb 7.2 oz (75.5 kg)   Per discussion with patient's fiancee, patient's weight has been stable.  BMI:  Body mass index is 26.06 kg/(m^2).  Estimated Nutritional Needs: Kcal: 1950-2050 Protein: 90-100 gm Fluid: >/= 2 L  Skin: no problems  Diet Order: NPO  EDUCATION NEEDS: -Education not  appropriate at this time   Intake/Output Summary (Last 24 hours) at 08/10/12 1258 Last data filed at 08/10/12 0400  Gross per 24 hour  Intake 1674.93 ml  Output   1350 ml  Net 324.93 ml    Last BM: 5/18   Labs:   Recent Labs Lab 08/08/12 0405 08/09/12 0400 08/10/12 0445  NA 133* 138 139  K 3.8 3.4* 3.9  CL 97 102 104  CO2 22 25 22   BUN 9 8 10   CREATININE 0.48* 0.59 0.64  CALCIUM 9.2 9.2 9.1  MG 1.8 1.8 1.7  PHOS 2.6 4.6 4.0  GLUCOSE 111* 115* 129*    CBG (last 3)   Recent Labs  08/08/12 1032  GLUCAP 119*    Scheduled Meds: . antiseptic oral rinse  15 mL Mouth Rinse q12n4p  . chlorhexidine  15 mL Mouth Rinse BID  . folic acid  1 mg Oral Daily  . lactulose  10 g Oral TID  . multivitamin with minerals  1 tablet Oral Daily  . pantoprazole (PROTONIX) IV  40 mg Intravenous QHS  . piperacillin-tazobactam (ZOSYN)  IV  3.375 g Intravenous Q8H  . sodium chloride  3 mL Intravenous Q12H  . sodium chloride  3 mL Intravenous Q12H  . thiamine  100 mg Oral Daily   Or  . thiamine  100 mg Intravenous Daily  . vancomycin  1,000 mg Intravenous Q6H    Continuous Infusions: . dexmedetomidine 1.2 mcg/kg/hr (08/10/12 0843)  . sodium chloride 0.45 % with kcl 75 mL/hr at 08/09/12 2339  Past Medical History  Diagnosis Date  . DVT (deep venous thrombosis)   . Anxiety   . Alcoholic   . Hypertension     Past Surgical History  Procedure Laterality Date  . Tonsillectomy      Joaquin Courts, RD, LDN, CNSC Pager [phone] After Hours Pager [phone]

## 2012-08-10 NOTE — Progress Notes (Signed)
ANTIBIOTIC CONSULT NOTE - FOLLOW UP  Pharmacy Consult for Vancomycin Indication: Suspected PNA and new fever  No Known Allergies Patient Measurements: Height: 5\' 7"  (170.2 cm) Weight: 166 lb 7.2 oz (75.5 kg) IBW/kg (Calculated) : 66.1 Vital Signs: Temp: 98.1 F (36.7 C) (05/20 0000) Temp src: Oral (05/20 0000) BP: 169/115 mmHg (05/20 0600) Pulse Rate: 80 (05/20 0600) Intake/Output from previous day: 05/19 0701 - 05/20 0700 In: 2125.6 [I.V.:1925.6; IV Piggyback:200] Out: 1675 [Urine:1675] Intake/Output from this shift:    Labs:  Recent Labs  08/08/12 0405 08/09/12 0400 08/10/12 0445  WBC 9.8 5.0 5.9  HGB 13.9 14.4 15.5  PLT 18* 19* 30*  CREATININE 0.48* 0.59 0.64   Estimated Creatinine Clearance: 111.3 ml/min (by C-G formula based on Cr of 0.64).  Recent Labs  08/10/12 0445  VANCOTROUGH 6.0*    Microbiology: Recent Results (from the past 720 hour(s))  URINE CULTURE     Status: None   Collection Time    08/06/12  9:45 AM      Result Value Range Status   Specimen Description URINE, RANDOM   Final   Special Requests NONE   Final   Culture  Setup Time 08/06/2012 15:03   Final   Colony Count NO GROWTH   Final   Culture NO GROWTH   Final   Report Status 08/07/2012 FINAL   Final  MRSA PCR SCREENING     Status: None   Collection Time    08/08/12 10:24 AM      Result Value Range Status   MRSA by PCR NEGATIVE  NEGATIVE Final   Comment:            The GeneXpert MRSA Assay (FDA     approved for NASAL specimens     only), is one component of a     comprehensive MRSA colonization     surveillance program. It is not     intended to diagnose MRSA     infection nor to guide or     monitor treatment for     MRSA infections.    Anti-infectives   Start     Dose/Rate Route Frequency Ordered Stop   08/09/12 1300  vancomycin (VANCOCIN) IVPB 1000 mg/200 mL premix     1,000 mg 200 mL/hr over 60 Minutes Intravenous Every 8 hours 08/09/12 0951     08/08/12 1245   vancomycin (VANCOCIN) IVPB 1000 mg/200 mL premix  Status:  Discontinued     1,000 mg 200 mL/hr over 60 Minutes Intravenous Every 8 hours 08/08/12 1233 08/09/12 0951     Assessment: 42 yo male with hx ETOH abuse and anxiety admitted 5/16 with acute pancreatitis, ETOH hepatitis and thrombocytopenia. Also with tender, red, parotid swelling worrisome for parotiditis per CCM over weekend. Dr. Marchelle Gearing feels this is just parotid swelling associated with liver disease and not infectious. However patient with new fever and infiltrates on chest xray. Pharmacy asked to continue empiric vancomycin- day #3 and add Zosyn. Scr 0.64; estimated CrCl > 100. Note decreased UOP.   Vancomycin trough at C[facility] was subtherapeutic at 6 on 1g IV q8h. WBC is within normal limits. Temp 100.9.  5/16 Urine culture- negative.    Goal of Therapy:  Vancomycin trough level 15-20 mcg/ml  Plan:  1. Increase Vancomycin to 1000mg  IV q6h.  2. Recheck Vancomycin trough at Css 3. Add Zosyn 3.375g IV q8h - 4hr infusion.  4. Monitor SCr and UOP closely.  5. Follow-up new cultures.   Shanda Bumps  Lonzo Cloud, PharmD, BCPS Clinical Pharmacist [phone] 08/10/2012,8:20 AM

## 2012-08-10 NOTE — Progress Notes (Signed)
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 He states he drinks about a 12 pack of beer a day for years. He stated he was having generalized abdominal pain mostly over upper abdomen that did not improve with pepto bismol. He states he's had this pain before but it has usually gone away on its own. He has not been taking any of his medications. He admits to some recent bloody noses and easy bruising but no significant bleeding. He denies melena, hematochezia, hematuria.  He was noted to have evidence of alcoholic hepatitis on admission with AST 92 ALT 40, thrombocytopenia of 13k, and an alcohol level of 198. CT of the abdomen suggested pancreatitis as well as some lesions scattered throughout his liver that need follow up with MRI.      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 Has been to several detox and rehab programs in past without lasting sobriety.   No recent known injuries, or falls. He has had worsening agitation, confusion and combativeness despite CIWA protocol. No improvement after multiple doses of ativan IV, and oral doses, and librium.   CCM consulted for consideration of precedex drip and assistance with ETOH w/d symptoms.   Pt has peroids of confusion .  He denies abd pain, +nausea. He is sitting up in bed eating clear liquid breakfast, Confusion to date and place. Agitated to stimulation . Combative to staff.     LINES / TUBES:   CULTURES:  Recent Labs Lab 08/06/12 1648  LATICACIDVEN 1.9   No results found for this basename: PROCALCITON,  in the last 168  hours   ANTIBIOTICS: Anti-infectives   Start     Dose/Rate Route Frequency Ordered Stop   08/09/12 1300  vancomycin (VANCOCIN) IVPB 1000 mg/200 mL premix     1,000 mg 200 mL/hr over 60 Minutes Intravenous Every 8 hours 08/09/12 0951     08/08/12 1245  vancomycin (VANCOCIN) IVPB 1000 mg/200 mL premix  Status:  Discontinued     1,000 mg 200 mL/hr over 60 Minutes Intravenous Every 8 hours 08/08/12 1233 08/09/12 0951        SIGNIFICANT EVENTS / STUDIES:  5/16 CT abd /pelvis >Subtle stranding about the tail of the pancreas suggestive of acute uncomplicated pancreatitis.   Stigmata of cirrhosis and portal venous hypertension, multiple hypoattenuating lesions scattered throughout  the liver  >stigmata of cirrhosis 5/18 Transferred to ICU precedex drip   08/09/2012: Continues on Precedex infusion but slightly more agitated since 9:30 AM. Requiring intermittent Ativan. Fianc and 2 friends or the bedside and per nursing more agitated after that presents. Required 4 mg Ativan to calm him down    SUBJECTIVE/OVERNIGHT/INTERVAL HX 08/10/12: Continued agitated delirium. Has required 60 mg of Ativan in the last 24 hours along with continuous infusion of Precedex. Has not been able to get lactulose. Has new onset fever 100.9 Fahrenheit. Platelet count improving   VITAL SIGNS: Temp:  [97.2 F (36.2 C)-100.9 F (38.3 C)] 100.9 F (38.3 C) (05/20 0833) Pulse Rate:  [69-95] 95 (05/20 0945)  Resp:  [14-25] 23 (05/20 0945) BP: (126-184)/(76-124) 157/99 mmHg (05/20 0945) SpO2:  [99 %-100 %] 100 % (05/20 0945) Weight:  [75.5 kg (166 lb 7.2 oz)] 75.5 kg (166 lb 7.2 oz) (05/20 0500) HEMODYNAMICS:   VENTILATOR SETTINGS:   INTAKE / OUTPUT: Intake/Output     05/19 0701 - 05/20 0700 05/20 0701 - 05/21 0700   P.O.     I.V. (mL/kg) 1925.6 (25.5)    IV Piggyback 200    Total Intake(mL/kg) 2125.6 (28.2)    Urine (mL/kg/hr) 1675 (0.9)    Total Output 1675     Net +450.6          Urine Occurrence 1  x    Stool Occurrence 2 x      PHYSICAL EXAMINATION: General: Critically ill looking male on the bed in the ICU. No ventilator Neuro:  RASS sedation score - 3he at this point with Precedex and when necessary Ativan  HEENT:  Dry mucosa . Bilateral parotid gland enlargement do to cirrhosis Cardiovascular:  NSR , no m/r/g , no edema  Lungs:  Diminshed BS in bases  Abdomen:  Mild Distended, +hepatospleenomegaly, BS +, non tender  Musculoskeletal:  Intact, MAEWx 4  Skin:  Intact w/ no rash or breakdown. Has spider nevi on his chest.  LABS: - See individual assessment and plan   Imaging x48 hours No results found.   ASSESSMENT / PLAN:  PULMONARY No results found for this basename: PHART, PCO2, PCO2ART, PO2, PO2ART, HCO3, TCO2, O2SAT,  in the last 168 hours  A: No apparent complication and currently protecting airway but he is at risk for intubation because of ongoing delirium P:   Monitor    CARDIOVASCULAR No results found for this basename: PROBNP,  in the last 168 hours    Recent Labs Lab 08/06/12 1417 08/06/12 2030 08/07/12 0221  TROPONINI <0.30 <0.30 <0.30    Recent Labs Lab 08/06/12 1648  LATICACIDVEN 1.9     A: HTN  Neg cardiac enzymes , no evidence of shock -lactate nml . Hypertensive on 08/10/12. Getting labetalol every  P:  Monitor  Try hydralazine prn    RENAL  Recent Labs Lab 08/06/12 0934 08/06/12 1413 08/07/12 0221 08/08/12 0405 08/09/12 0400 08/10/12 0445  NA 132*  --  131* 133* 138 139  K 3.5  --  4.0 3.8 3.4* 3.9  CL 93*  --  99 97 102 104  CO2 21  --  24 22 25 22   GLUCOSE 143*  --  129* 111* 115* 129*  BUN 3*  --  5* 9 8 10   CREATININE 0.44*  --  0.49* 0.48* 0.59 0.64  CALCIUM 9.2  --  8.7 9.2 9.2 9.1  MG  --  1.5 2.1 1.8 1.8 1.7  PHOS  --   --   --  2.6 4.6 4.0   Intake/Output     05/19 0701 - 05/20 0700 05/20 0701 - 05/21 0700   P.O.     I.V. (mL/kg) 1925.6 (25.5)    IV Piggyback 200    Total Intake(mL/kg) 2125.6  (28.2)    Urine (mL/kg/hr) 1675 (0.9)    Total Output 1675     Net +450.6          Urine Occurrence 1 x    Stool Occurrence 2 x      A:  NO apparent complication w/ nml scr and good UOP but mild hypomagnesemia P:   2 g IV magnesium sulfate was  replaced by electronic ICU  GASTROINTESTINAL  Recent Labs Lab 08/06/12 0934 08/06/12 1413 08/08/12 0405 08/09/12 0400 08/10/12 0445  AST 92*  --  55* 50* 43*  ALT 40  --  27 29 28   ALKPHOS 131*  --  128* 131* 128*  BILITOT 2.0*  --  2.5* 1.9* 2.1*  PROT 7.9  --  7.3 7.1 6.9  ALBUMIN 3.4*  --  3.0* 2.9* 2.8*  INR  --  1.28  --  1.42 1.38    A:  Acute Pancreatitis , evidence on CT , elevated lipase  ETOH Hepatitis with cirrhosis/thrombocytopenia  No evidence of active bleeding, INR nml , hepatitis viral panel neg   - 08/09/2012 and 08/10/2012: Lipase has normalized. Abdominal exam is nonfocal  P:   Place NG tube [they did come out 30,000) so we will risk it Start tube feeds  HEMATOLOGIC  Recent Labs Lab 08/08/12 0405 08/09/12 0400 08/10/12 0445  HGB 13.9 14.4 15.5  HCT 39.5 40.6 43.2  WBC 9.8 5.0 5.9  PLT 18* 19* 30*    Recent Labs Lab 08/06/12 1413 08/09/12 0400 08/10/12 0445  INR 1.28 1.42 1.38    A:  Thrombocytopenia secondary to Liver dsyfxn. /ETOH  History of DVT? Can't anticoagulate now due to low plt - 08/09/2012 and 08/10/2012: Improving platelet count P:  SCD , no hep  Monitor closely  Check PLT count in citrated tube Transfuse PLT if bleeding or procedures and PLT count < 50K. otherwise sinuses are less than 10,000 Clarify when DVT occurred when more awake    INFECTIOUS No results found for this basename: PROCALCITON,  in the last 168 hours  Recent Labs Lab 08/06/12 1648  LATICACIDVEN 1.9    Recent Labs Lab 08/06/12 1413 08/07/12 0221 08/08/12 0405 08/09/12 0400 08/10/12 0445  WBC 7.4 8.7 9.8 5.0 5.9     A:  New onset fever on 08/10/2012,. Clinically no evidence of parotid  infection P:   Check sepsis biomarkers Panculture Continue vancomycin Start Zosyn 08/10/2012  ENDOCRINE A:  Hyperlipidemia , TG nml , TSH nml  P:   Cont to monitor    NEUROLOGIC A:  ETOH w/d , agitation/delirium without focal neurologic findings to suggest bleed  - 08/10/2012: Continues with agitated delirium despite Precedex. Has received 60 mg Ativan in the last 24 hours. Ammonia level is slightly high and has been unable to get lactulose due to lack of GI access due to a low platelets  P:   Place NG tube and start lactulose now the platelet count is improved Continue Precedex since 08/08/2012 Continue Ativan when necessary   Global 08/09/2012 and 08/10/2012: Fiancee updated at the bedside  The patient is critically ill with multiple organ systems failure and requires high complexity decision making for assessment and support, frequent evaluation and titration of therapies, application of advanced monitoring technologies and extensive interpretation of multiple databases.   Critical Care Time devoted to patient care services described in this note is  35  Minutes.  Dr. Kalman Shan, M.D., [facility].C.P Pulmonary and Critical Care Medicine Staff Physician [facility] Pager: [phone], If no answer or between  15:00h - 7:00h: call [phone]  08/10/2012 10:58 AM

## 2012-08-10 NOTE — Progress Notes (Signed)
Clinical Social Worker staffed case with MD during rounds; pt remains sedated.  CSW to sign off, please re consult when pt able to participate in assessment.    Angelia Mould, MSW, [place] [phone]

## 2012-08-11 LAB — COMPREHENSIVE METABOLIC PANEL
ALT: 30 U/L (ref 0–53)
AST: 49 U/L — ABNORMAL HIGH (ref 0–37)
Alkaline Phosphatase: 128 U/L — ABNORMAL HIGH (ref 39–117)
CO2: 24 mEq/L (ref 19–32)
Calcium: 8.7 mg/dL (ref 8.4–10.5)
Chloride: 101 mEq/L (ref 96–112)
GFR calc Af Amer: >90 mL/min (ref >90)
GFR calc non Af Amer: >90 mL/min (ref >90)
Glucose, Bld: 129 mg/dL — ABNORMAL HIGH (ref 70–99)
Potassium: 3.3 mEq/L — ABNORMAL LOW (ref 3.5–5.1)
Sodium: 137 mEq/L (ref 135–145)

## 2012-08-11 LAB — CBC WITH DIFFERENTIAL/PLATELET
Basophils Absolute: 0 10*3/uL (ref 0.0–0.1)
Eosinophils Relative: 2 % (ref 0–5)
Lymphocytes Relative: 14 % (ref 12–46)
Lymphs Abs: 0.8 10*3/uL (ref 0.7–4.0)
MCV: 91.8 fL (ref 78.0–100.0)
Neutro Abs: 3.7 10*3/uL (ref 1.7–7.7)
Neutrophils Relative %: 64 % (ref 43–77)
Platelets: 36 10*3/uL — ABNORMAL LOW (ref 150–400)
RBC: 4.74 MIL/uL (ref 4.22–5.81)
RDW: 14.8 % (ref 11.5–15.5)
WBC: 5.8 10*3/uL (ref 4.0–10.5)

## 2012-08-11 LAB — URINE CULTURE: Colony Count: NO GROWTH

## 2012-08-11 LAB — MAGNESIUM: Magnesium: 1.6 mg/dL (ref 1.5–2.5)

## 2012-08-11 LAB — PROCALCITONIN: Procalcitonin: 0.3 ng/mL

## 2012-08-11 MED ORDER — SODIUM CHLORIDE 0.9 % IV SOLN
INTRAVENOUS | Status: DC
  Administered 2012-08-11: 20 mL/h via INTRAVENOUS

## 2012-08-11 MED ORDER — MAGNESIUM SULFATE 40 MG/ML IJ SOLN
2.0000 g | Freq: Once | INTRAMUSCULAR | Status: AC
  Administered 2012-08-11: 2 g via INTRAVENOUS
  Filled 2012-08-11: qty 50

## 2012-08-11 MED ORDER — PANTOPRAZOLE SODIUM 40 MG PO PACK
40.0000 mg | PACK | Freq: Every day | ORAL | Status: DC
  Administered 2012-08-11 – 2012-08-12 (×2): 40 mg
  Filled 2012-08-11 (×4): qty 20

## 2012-08-11 MED ORDER — LACTULOSE 10 GM/15ML PO SOLN
20.0000 g | Freq: Three times a day (TID) | ORAL | Status: DC
  Administered 2012-08-11 – 2012-08-12 (×3): 20 g via ORAL
  Filled 2012-08-11 (×5): qty 30

## 2012-08-11 MED ORDER — POTASSIUM CHLORIDE 20 MEQ/15ML (10%) PO LIQD
40.0000 meq | Freq: Once | ORAL | Status: AC
  Administered 2012-08-11: 40 meq
  Filled 2012-08-11: qty 30

## 2012-08-11 NOTE — Progress Notes (Signed)
ANTIBIOTIC CONSULT NOTE - FOLLOW UP  Pharmacy Consult for Vancomycin, Zosyn Indication: Suspected PNA and new fever No Known Allergies Patient Measurements: Height: 5\' 7"  (170.2 cm) Weight: 166 lb 7.2 oz (75.5 kg) IBW/kg (Calculated) : 66.1 Vital Signs: Temp: 98.2 F (36.8 C) (05/21 0400) BP: 153/95 mmHg (05/21 0700) Pulse Rate: 79 (05/21 0700) Intake/Output from previous day: 05/20 0701 - 05/21 0700 In: 2748.3 [I.V.:1958.3; NG/GT:340; IV Piggyback:450] Out: 2071 [Urine:2070; Stool:1] Labs:  Recent Labs  08/09/12 0400 08/10/12 0445 08/11/12 0406  WBC 5.0 5.9 5.8  HGB 14.4 15.5 15.3  PLT 19* 30* 36*  CREATININE 0.59 0.64 0.66   Estimated Creatinine Clearance: 111.3 ml/min (by C-G formula based on Cr of 0.66).  Recent Labs  08/10/12 0445 08/11/12 0406  VANCOTROUGH 6.0* 6.6*   Microbiology: Recent Results (from the past 720 hour(s))  URINE CULTURE     Status: None   Collection Time    08/06/12  9:45 AM      Result Value Range Status   Specimen Description URINE, RANDOM   Final   Special Requests NONE   Final   Culture  Setup Time 08/06/2012 15:03   Final   Colony Count NO GROWTH   Final   Culture NO GROWTH   Final   Report Status 08/07/2012 FINAL   Final  MRSA PCR SCREENING     Status: None   Collection Time    08/08/12 10:24 AM      Result Value Range Status   MRSA by PCR NEGATIVE  NEGATIVE Final   Comment:            The GeneXpert MRSA Assay (FDA     approved for NASAL specimens     only), is one component of a     comprehensive MRSA colonization     surveillance program. It is not     intended to diagnose MRSA     infection nor to guide or     monitor treatment for     MRSA infections.   Anti-infectives   Start     Dose/Rate Route Frequency Ordered Stop   08/10/12 1400  piperacillin-tazobactam (ZOSYN) IVPB 3.375 g     3.375 g 12.5 mL/hr over 240 Minutes Intravenous 3 times per day 08/10/12 1109     08/10/12 1200  vancomycin (VANCOCIN) IVPB 1000  mg/200 mL premix     1,000 mg 200 mL/hr over 60 Minutes Intravenous 4 times per day 08/10/12 1109     08/09/12 1300  vancomycin (VANCOCIN) IVPB 1000 mg/200 mL premix  Status:  Discontinued     1,000 mg 200 mL/hr over 60 Minutes Intravenous Every 8 hours 08/09/12 0951 08/10/12 1109   08/08/12 1245  vancomycin (VANCOCIN) IVPB 1000 mg/200 mL premix  Status:  Discontinued     1,000 mg 200 mL/hr over 60 Minutes Intravenous Every 8 hours 08/08/12 1233 08/09/12 0951     Assessment: 41yo male with hx ETOH abuse and anxiety admitted 5/16 with acute pancreatitis, ETOH hepatitis and thrombocytopenia. Also with tender, red, parotid swelling worrisome for parotiditis per CCM over weekend. Dr. Marchelle Gearing feels this is just parotid swelling associated with liver disease and not infectious. However patient with new fever and infiltrates on chest xray.   Vancomycin increased 5/20 due to low trough and level rechecked at steady state. Level is still low at 6.6- drawn a little early. WBC is within normal limits and SCr is stable. Tmax 99.2. Cultures negative to date. CXR minimal R-atelectasis.  UOP is great at 1.1 cc/kg/hr. Procacitonin is 0.30 (not elevated).   Goal of Therapy:  Vancomycin trough level 15-20 mcg/ml  Plan:  1. Discussed clinical case with Dr. Marchelle Gearing and decided to stop therapy.   2. Continue Zosyn 3.375g IV q8h- 4hr infusion and followup clinical course and cultures.    Link Snuffer, PharmD, BCPS Clinical Pharmacist [phone] 08/11/2012,8:12 AM

## 2012-08-11 NOTE — Progress Notes (Signed)
eLink Physician-Brief Progress Note Patient Name: [name] DOB: 1970/07/26 MRN: [id number]  Date of Service  08/11/2012   HPI/Events of Note  Hypokalemia and low mag   eICU Interventions  Potassium and mag replaced   Intervention Category Intermediate Interventions: Electrolyte abnormality - evaluation and management  [name] 08/11/2012, 6:31 AM

## 2012-08-11 NOTE — Progress Notes (Signed)
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 He states he drinks about a 12 pack of beer a day for years. He stated he was having generalized abdominal pain mostly over upper abdomen that did not improve with pepto bismol. He states he's had this pain before but it has usually gone away on its own. He has not been taking any of his medications. He admits to some recent bloody noses and easy bruising but no significant bleeding. He denies melena, hematochezia, hematuria.  He was noted to have evidence of alcoholic hepatitis on admission with AST 92 ALT 40, thrombocytopenia of 13k, and an alcohol level of 198. CT of the abdomen suggested pancreatitis as well as some lesions scattered throughout his liver that need follow up with MRI.      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 Has been to several detox and rehab programs in past without lasting sobriety.   No recent known injuries, or falls. He has had worsening agitation, confusion and combativeness despite CIWA protocol. No improvement after multiple doses of ativan IV, and oral doses, and librium.   CCM consulted for consideration of precedex drip and assistance with ETOH w/d symptoms.   Pt has peroids of confusion .  He denies abd pain, +nausea. He is sitting up in bed eating clear liquid breakfast, Confusion to date and place. Agitated to stimulation . Combative to staff.     LINES / TUBES:   CULTURES:  Recent Labs Lab 08/06/12 1648 08/10/12 1145 08/10/12 1200 08/11/12 0406  LATICACIDVEN 1.9  --  1.5  --   PROCALCITON   --  0.31  --  0.30    Recent Labs Lab 08/10/12 1145 08/11/12 0406  PROCALCITON 0.31 0.30     ANTIBIOTICS: Anti-infectives   Start     Dose/Rate Route Frequency Ordered Stop   08/10/12 1400  piperacillin-tazobactam (ZOSYN) IVPB 3.375 g     3.375 g 12.5 mL/hr over 240 Minutes Intravenous 3 times per day 08/10/12 1109     08/10/12 1200  vancomycin (VANCOCIN) IVPB 1000 mg/200 mL premix     1,000 mg 200 mL/hr over 60 Minutes Intravenous 4 times per day 08/10/12 1109     08/09/12 1300  vancomycin (VANCOCIN) IVPB 1000 mg/200 mL premix  Status:  Discontinued     1,000 mg 200 mL/hr over 60 Minutes Intravenous Every 8 hours 08/09/12 0951 08/10/12 1109   08/08/12 1245  vancomycin (VANCOCIN) IVPB 1000 mg/200 mL premix  Status:  Discontinued     1,000 mg 200 mL/hr over 60 Minutes Intravenous Every 8 hours 08/08/12 1233 08/09/12 0951        SIGNIFICANT EVENTS / STUDIES:  5/16 CT abd /pelvis >Subtle stranding about the tail of the pancreas suggestive of acute uncomplicated pancreatitis.   Stigmata of cirrhosis and portal venous hypertension, multiple hypoattenuating lesions scattered throughout  the liver  >stigmata of cirrhosis 5/18 Transferred to ICU precedex drip   08/09/2012: Continues on Precedex infusion but slightly more agitated since 9:30 AM. Requiring intermittent Ativan. Fianc  and 2 friends or the bedside and per nursing more agitated after that presents. Required 4 mg Ativan to calm him down  08/10/12: Continued agitated delirium. Has required 60 mg of Ativan in the last 24 hours along with continuous infusion of Precedex. Has not been able to get lactulose. Has new onset fever 100.9 Fahrenheit. Platelet count improving   SUBJECTIVE/OVERNIGHT/INTERVAL HX 08/11/2012: Fever curve improved after expanding antibiotic coverage yesterday. Continued delirium despite Precedex, Ativan when necessary and starting scheduled lactulose yesterday. Platelet count is improving.  VITAL  SIGNS: Temp:  [98.1 F (36.7 C)-99.2 F (37.3 C)] 98.2 F (36.8 C) (05/21 0400) Pulse Rate:  [71-93] 80 (05/21 0800) Resp:  [14-25] 18 (05/21 0800) BP: (127-176)/(86-145) 128/86 mmHg (05/21 0800) SpO2:  [99 %-100 %] 100 % (05/21 0800) HEMODYNAMICS:   VENTILATOR SETTINGS:   INTAKE / OUTPUT: Intake/Output     05/20 0701 - 05/21 0700 05/21 0701 - 05/22 0700   I.V. (mL/kg) 1958.3 (25.9) 75 (1)   NG/GT 340 40   IV Piggyback 450    Total Intake(mL/kg) 2748.3 (36.4) 115 (1.5)   Urine (mL/kg/hr) 2070 (1.1)    Stool 1 (0)    Total Output 2071     Net +677.3 +115          PHYSICAL EXAMINATION: General: Critically ill looking male on the bed in the ICU. No ventilator Neuro:  RASS sedation score - 2 at this point with Precedex and when necessary Ativan and scheduled lacutlose. Making more sense with words HEENT:  Dry mucosa . Bilateral parotid gland enlargement do to cirrhosis Cardiovascular:  NSR , no m/r/g , no edema  Lungs:  Diminshed BS in bases  Abdomen:  Mild Distended, +hepatospleenomegaly, BS +, non tender  Musculoskeletal:  Intact, MAEWx 4  Skin:  Intact w/ no rash or breakdown. Has spider nevi on his chest.  LABS: - See individual assessment and plan   Imaging x48 hours Dg Chest Port 1 View  08/10/2012   *RADIOLOGY REPORT*  Clinical Data: Fever question pneumonia  PORTABLE CHEST - 1 VIEW  Comparison: Portable exam 1131 hours compared to 08/06/2012  Findings: Monitoring lines project over chest. Enlargement of cardiac silhouette. Mediastinal contours and pulmonary vascularity normal. Minimal atelectasis at right base. Lungs otherwise clear. No pleural effusion or pneumothorax. No acute osseous findings.  IMPRESSION: Enlargement of cardiac silhouette. Minimal right basilar atelectasis.   Original Report Authenticated By: Ulyses Southward, M.D.   Dg Abd Portable 1v  08/10/2012   *RADIOLOGY REPORT*  Clinical Data:  nasogastric tube placement  PORTABLE ABDOMEN - 1 VIEW  Comparison:   Study obtained earlier in the day  Findings:  Nasogastric tube tip and side port in the stomach. There is contrast in the ascending colon.  The bowel gas pattern overall is nonspecific.  No obstruction or free air is seen on this supine examination.  IMPRESSION: Nasogastric tube tip and sideport in stomach.  Nonspecific gas pattern.   Original Report Authenticated By: Bretta Bang, M.D.   Dg Abd Portable 1v  08/10/2012   *RADIOLOGY REPORT*  Clinical Data: Evaluate nasogastric tube placement.  PORTABLE ABDOMEN - 1 VIEW  Comparison: No priors.  Findings: No nasogastric tube is identified.  Gas, stool and oral contrast material is noted throughout the colon.  There are a few nondilated loops of gas-filled small bowel in the central abdomen which are nonspecific.  No gross evidence of pneumoperitoneum on this single supine view.  IMPRESSION: 1.  No nasogastric tube identified.  Clinical correlation is suggested. 2.  Nonspecific, nonobstructive bowel gas pattern, as above.   Original Report Authenticated By: Trudie Reed, M.D.     ASSESSMENT / PLAN:  PULMONARY No results found for this basename: PHART, PCO2, PCO2ART, PO2, PO2ART, HCO3, TCO2, O2SAT,  in the last 168 hours  A: No apparent complication and currently protecting airway but he is at risk for intubation because of ongoing delirium P:   Monitor    CARDIOVASCULAR No results found for this basename: PROBNP,  in the last 168 hours    Recent Labs Lab 08/06/12 1417 08/06/12 2030 08/07/12 0221  TROPONINI <0.30 <0.30 <0.30    Recent Labs Lab 08/06/12 1648 08/10/12 1145 08/10/12 1200 08/11/12 0406  LATICACIDVEN 1.9  --  1.5  --   PROCALCITON  --  0.31  --  0.30     A: HTN  Neg cardiac enzymes , no evidence of shock -lactate nml . High BP   P:  Monitor  Try hydralazine prn    RENAL  Recent Labs Lab 08/07/12 0221 08/08/12 0405 08/09/12 0400 08/10/12 0445 08/11/12 0406  NA 131* 133* 138 139 137  K 4.0 3.8  3.4* 3.9 3.3*  CL 99 97 102 104 101  CO2 24 22 25 22 24   GLUCOSE 129* 111* 115* 129* 129*  BUN 5* 9 8 10 11   CREATININE 0.49* 0.48* 0.59 0.64 0.66  CALCIUM 8.7 9.2 9.2 9.1 8.7  MG 2.1 1.8 1.8 1.7 1.6  PHOS  --  2.6 4.6 4.0 3.4   Intake/Output     05/20 0701 - 05/21 0700 05/21 0701 - 05/22 0700   I.V. (mL/kg) 1958.3 (25.9) 75 (1)   NG/GT 340 40   IV Piggyback 450    Total Intake(mL/kg) 2748.3 (36.4) 115 (1.5)   Urine (mL/kg/hr) 2070 (1.1)    Stool 1 (0)    Total Output 2071     Net +677.3 +115          A:  NO apparent complication w/ nml scr and good UOP but mild hypomagnesemia and mild low K  P:   K and magnesium sulfate was replaced by electronic ICU  GASTROINTESTINAL  Recent Labs Lab 08/06/12 0934 08/06/12 1413 08/08/12 0405 08/09/12 0400 08/10/12 0445 08/11/12 0406  AST 92*  --  55* 50* 43* 49*  ALT 40  --  27 29 28 30   ALKPHOS 131*  --  128* 131* 128* 128*  BILITOT 2.0*  --  2.5* 1.9* 2.1* 1.7*  PROT 7.9  --  7.3 7.1 6.9 6.8  ALBUMIN 3.4*  --  3.0* 2.9* 2.8* 2.7*  INR  --  1.28  --  1.42 1.38  --     A:  Acute Pancreatitis , evidence on CT , elevated lipase  ETOH Hepatitis with cirrhosis/thrombocytopenia  No evidence of active bleeding, INR nml , hepatitis viral panel neg   - 08/09/2012 08/11/12: Lipase has normalized. Abdominal exam is nonfocal  P:    tube feeds since 08/10/12  HEMATOLOGIC  Recent Labs Lab 08/09/12 0400 08/10/12 0445 08/11/12 0406  HGB 14.4 15.5 15.3  HCT 40.6 43.2 43.5  WBC 5.0 5.9 5.8  PLT 19* 30* 36*    Recent Labs Lab 08/06/12 1413 08/09/12 0400 08/10/12 0445  INR 1.28 1.42 1.38    A:  Thrombocytopenia secondary to Liver dsyfxn. /ETOH  History of DVT? Can't anticoagulate now due to low plt -May 21th 2014: Improving platelet count P:  SCD , no hep  Monitor closely  Check PLT count in citrated tube Transfuse PLT if bleeding or procedures and PLT count < 50K. otherwise sinuses are less than 10,000 Clarify when  DVT occurred when more awake    INFECTIOUS  Recent Labs Lab 08/10/12 1145 08/11/12 0406  PROCALCITON 0.31 0.30    Recent Labs Lab 08/06/12 1648 08/10/12 1145 08/10/12 1200 08/11/12 0406  LATICACIDVEN 1.9  --  1.5  --   PROCALCITON  --  0.31  --  0.30    Recent Labs Lab 08/07/12 0221 08/08/12 0405 08/09/12 0400 08/10/12 0445 08/11/12 0406  WBC 8.7 9.8 5.0 5.9 5.8     A:  New onset fever on 08/10/2012,. Clinically no evidence of parotid infection.   - 08/11/2012: Fever resolved after starting Zosyn 24 hours earlier. No clear-cut source of fever P:   Monitor sepsis biomarkers Await Panculture Stop vancomycin Continue Zosyn since 08/10/2012; establish stop date depending on course and pro-CAL   ENDOCRINE A:  Hyperlipidemia , TG nml , TSH nml  P:   Cont to monitor    NEUROLOGIC A:  ETOH w/d , agitation/delirium without focal neurologic findings to suggest bleed  - 08/11/2012: Agitated delirium persists despite Precedex infusion and starting scheduled lactulose 24 hours ago. Has received 14 mg of lorazepam in the last 24 hours which is ? Improvement since last 24 hours  P:   Continue Precedex since 08/08/2012 Continue scheduled lactulose since 08/10/2012 but increase dosage Continue Ativan when necessary   Global 08/09/2012 and 08/10/2012 and 08/11/2012: Fiancee updated at the bedside  The patient is critically ill with multiple organ systems failure and requires high complexity decision making for assessment and support, frequent evaluation and titration of therapies, application of advanced monitoring technologies and extensive interpretation of multiple databases.   Critical Care Time devoted to patient care services described in this note is  35  Minutes.  Dr. Kalman Shan, M.D., [facility].C.P Pulmonary and Critical Care Medicine Staff Physician [facility] Pager: [phone], If no answer or between   15:00h - 7:00h: call [phone]  08/11/2012 11:58 AM

## 2012-08-12 LAB — CBC WITH DIFFERENTIAL/PLATELET
Eosinophils Relative: 2 % (ref 0–5)
Hemoglobin: 13.9 g/dL (ref 13.0–17.0)
Lymphocytes Relative: 17 % (ref 12–46)
Lymphs Abs: 0.8 10*3/uL (ref 0.7–4.0)
MCV: 93.5 fL (ref 78.0–100.0)
Monocytes Relative: 26 % — ABNORMAL HIGH (ref 3–12)
Neutrophils Relative %: 55 % (ref 43–77)
Platelets: 31 10*3/uL — ABNORMAL LOW (ref 150–400)
RBC: 4.43 MIL/uL (ref 4.22–5.81)
WBC: 4.7 10*3/uL (ref 4.0–10.5)

## 2012-08-12 LAB — COMPREHENSIVE METABOLIC PANEL
ALT: 35 U/L (ref 0–53)
Alkaline Phosphatase: 125 U/L — ABNORMAL HIGH (ref 39–117)
BUN: 10 mg/dL (ref 6–23)
CO2: 26 mEq/L (ref 19–32)
GFR calc Af Amer: >90 mL/min (ref >90)
GFR calc non Af Amer: >90 mL/min (ref >90)
Glucose, Bld: 144 mg/dL — ABNORMAL HIGH (ref 70–99)
Potassium: 3.2 mEq/L — ABNORMAL LOW (ref 3.5–5.1)
Sodium: 142 mEq/L (ref 135–145)
Total Bilirubin: 1.2 mg/dL (ref 0.3–1.2)

## 2012-08-12 MED ORDER — LACTULOSE 10 GM/15ML PO SOLN
30.0000 g | Freq: Three times a day (TID) | ORAL | Status: DC
  Administered 2012-08-12 – 2012-08-17 (×7): 30 g via ORAL
  Filled 2012-08-12 (×16): qty 45

## 2012-08-12 MED ORDER — MAGNESIUM SULFATE 50 % IJ SOLN
1.0000 g | Freq: Once | INTRAMUSCULAR | Status: DC
  Filled 2012-08-12: qty 2

## 2012-08-12 MED ORDER — POTASSIUM CHLORIDE 20 MEQ/15ML (10%) PO LIQD
40.0000 meq | Freq: Once | ORAL | Status: AC
  Administered 2012-08-12: 40 meq
  Filled 2012-08-12: qty 30

## 2012-08-12 MED ORDER — CLONIDINE HCL 0.1 MG PO TABS
0.1000 mg | ORAL_TABLET | Freq: Three times a day (TID) | ORAL | Status: DC
  Administered 2012-08-12 – 2012-08-14 (×6): 0.1 mg via NASOGASTRIC
  Filled 2012-08-12 (×9): qty 1

## 2012-08-12 MED ORDER — LORAZEPAM 2 MG/ML IJ SOLN
1.0000 mg | INTRAMUSCULAR | Status: DC | PRN
  Administered 2012-08-13 – 2012-08-16 (×4): 2 mg via INTRAVENOUS
  Filled 2012-08-12 (×3): qty 1
  Filled 2012-08-12: qty 2

## 2012-08-12 MED ORDER — LACTULOSE 10 GM/15ML PO SOLN
30.0000 g | Freq: Four times a day (QID) | ORAL | Status: DC
Start: 2012-08-12 — End: 2012-08-12
  Administered 2012-08-12: 30 g via ORAL
  Filled 2012-08-12 (×4): qty 45

## 2012-08-12 MED ORDER — MAGNESIUM SULFATE IN D5W 10-5 MG/ML-% IV SOLN
1.0000 g | Freq: Once | INTRAVENOUS | Status: AC
  Administered 2012-08-12: 1 g via INTRAVENOUS
  Filled 2012-08-12: qty 100

## 2012-08-12 NOTE — Progress Notes (Signed)
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 He states he drinks about a 12 pack of beer a day for years. He stated he was having generalized abdominal pain mostly over upper abdomen that did not improve with pepto bismol. He states he's had this pain before but it has usually gone away on its own. He has not been taking any of his medications. He admits to some recent bloody noses and easy bruising but no significant bleeding. He denies melena, hematochezia, hematuria.  He was noted to have evidence of alcoholic hepatitis on admission with AST 92 ALT 40, thrombocytopenia of 13k, and an alcohol level of 198. CT of the abdomen suggested pancreatitis as well as some lesions scattered throughout his liver that need follow up with MRI.      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 Has been to several detox and rehab programs in past without lasting sobriety.   No recent known injuries, or falls. He has had worsening agitation, confusion and combativeness despite CIWA protocol. No improvement after multiple doses of ativan IV, and oral doses, and librium.   CCM consulted for consideration of precedex drip and assistance with ETOH w/d symptoms.   Pt has peroids of confusion .  He denies abd pain, +nausea. He is sitting up in bed eating clear liquid breakfast, Confusion to date and place. Agitated to stimulation . Combative to staff.     LINES / TUBES:   CULTURES:  Recent Labs Lab 08/06/12 1648 08/10/12 1145 08/10/12 1200 08/11/12 0406 08/12/12 0446  LATICACIDVEN 1.9  --  1.5  --    --   PROCALCITON  --  0.31  --  0.30 0.20    Recent Labs Lab 08/10/12 1145 08/11/12 0406 08/12/12 0446  PROCALCITON 0.31 0.30 0.20     ANTIBIOTICS: Anti-infectives   Start     Dose/Rate Route Frequency Ordered Stop   08/10/12 1400  piperacillin-tazobactam (ZOSYN) IVPB 3.375 g     3.375 g 12.5 mL/hr over 240 Minutes Intravenous 3 times per day 08/10/12 1109     08/10/12 1200  vancomycin (VANCOCIN) IVPB 1000 mg/200 mL premix  Status:  Discontinued     1,000 mg 200 mL/hr over 60 Minutes Intravenous 4 times per day 08/10/12 1109 08/11/12 1203   08/09/12 1300  vancomycin (VANCOCIN) IVPB 1000 mg/200 mL premix  Status:  Discontinued     1,000 mg 200 mL/hr over 60 Minutes Intravenous Every 8 hours 08/09/12 0951 08/10/12 1109   08/08/12 1245  vancomycin (VANCOCIN) IVPB 1000 mg/200 mL premix  Status:  Discontinued     1,000 mg 200 mL/hr over 60 Minutes Intravenous Every 8 hours 08/08/12 1233 08/09/12 0951        SIGNIFICANT EVENTS / STUDIES:  5/16 CT abd /pelvis >Subtle stranding about the tail of the pancreas suggestive of acute uncomplicated pancreatitis.   Stigmata of cirrhosis and portal venous hypertension, multiple hypoattenuating lesions scattered throughout  the liver  >stigmata of cirrhosis 5/18 Transferred to ICU precedex drip   08/09/2012: Continues on  Precedex infusion but slightly more agitated since 9:30 AM. Requiring intermittent Ativan. Fianc and 2 friends or the bedside and per nursing more agitated after that presents. Required 4 mg Ativan to calm him down  08/10/12: Continued agitated delirium. Has required 60 mg of Ativan in the last 24 hours along with continuous infusion of Precedex. Has not been able to get lactulose. Has new onset fever 100.9 Fahrenheit. Platelet count improving  08/11/2012: Fever curve improved after expanding antibiotic coverage yesterday. Continued delirium despite Precedex, Ativan when necessary and starting scheduled lactulose yesterday.  Platelet count is improving.   SUBJECTIVE/OVERNIGHT/INTERVAL HX 08/12/2012: Continued agitation despite Precedex and lactulose. He has received 24 mg of Ativan in 24 hours.   VITAL SIGNS: Temp:  [98.2 F (36.8 C)-99.1 F (37.3 C)] 98.2 F (36.8 C) (05/22 0834) Pulse Rate:  [64-80] 71 (05/22 0900) Resp:  [13-26] 16 (05/22 0900) BP: (93-175)/(57-108) 130/95 mmHg (05/22 0900) SpO2:  [98 %-100 %] 99 % (05/22 0900) Weight:  [79.6 kg (175 lb 7.8 oz)] 79.6 kg (175 lb 7.8 oz) (05/22 0500) HEMODYNAMICS:   VENTILATOR SETTINGS:   INTAKE / OUTPUT: Intake/Output     05/21 0701 - 05/22 0700 05/22 0701 - 05/23 0700   I.V. (mL/kg) 753 (9.5) 152 (1.9)   NG/GT 1230 210   IV Piggyback 260    Total Intake(mL/kg) 2243 (28.2) 362 (4.5)   Urine (mL/kg/hr) 851 (0.4) 35 (0.1)   Stool     Total Output 851 35   Net +1392 +327          PHYSICAL EXAMINATION: General: Critically ill looking male on the bed in the ICU. No ventilator Neuro:  RASS sedation score - 2 at this point with Precedex and when necessary Ativan and scheduled lacutlose. Making more sense with words HEENT:  Dry mucosa . Bilateral parotid gland enlargement do to cirrhosis Cardiovascular:  NSR , no m/r/g , no edema  Lungs:  Diminshed BS in bases  Abdomen:  Mild Distended, +hepatospleenomegaly, BS +, non tender  Musculoskeletal:  Intact, MAEWx 4  Skin:  Intact w/ no rash or breakdown. Has spider nevi on his chest.  LABS: - See individual assessment and plan   Imaging x48 hours Dg Chest Port 1 View  08/10/2012   *RADIOLOGY REPORT*  Clinical Data: Fever question pneumonia  PORTABLE CHEST - 1 VIEW  Comparison: Portable exam 1131 hours compared to 08/06/2012  Findings: Monitoring lines project over chest. Enlargement of cardiac silhouette. Mediastinal contours and pulmonary vascularity normal. Minimal atelectasis at right base. Lungs otherwise clear. No pleural effusion or pneumothorax. No acute osseous findings.  IMPRESSION:  Enlargement of cardiac silhouette. Minimal right basilar atelectasis.   Original Report Authenticated By: Ulyses Southward, M.D.   Dg Abd Portable 1v  08/10/2012   *RADIOLOGY REPORT*  Clinical Data:  nasogastric tube placement  PORTABLE ABDOMEN - 1 VIEW  Comparison:  Study obtained earlier in the day  Findings:  Nasogastric tube tip and side port in the stomach. There is contrast in the ascending colon.  The bowel gas pattern overall is nonspecific.  No obstruction or free air is seen on this supine examination.  IMPRESSION: Nasogastric tube tip and sideport in stomach.  Nonspecific gas pattern.   Original Report Authenticated By: Bretta Bang, M.D.   Dg Abd Portable 1v  08/10/2012   *RADIOLOGY REPORT*  Clinical Data: Evaluate nasogastric tube placement.  PORTABLE ABDOMEN - 1 VIEW  Comparison: No priors.  Findings: No nasogastric tube is identified.  Gas, stool  and oral contrast material is noted throughout the colon.  There are a few nondilated loops of gas-filled small bowel in the central abdomen which are nonspecific.  No gross evidence of pneumoperitoneum on this single supine view.  IMPRESSION: 1.  No nasogastric tube identified.  Clinical correlation is suggested. 2.  Nonspecific, nonobstructive bowel gas pattern, as above.   Original Report Authenticated By: Trudie Reed, M.D.     ASSESSMENT / PLAN:  PULMONARY No results found for this basename: PHART, PCO2, PCO2ART, PO2, PO2ART, HCO3, TCO2, O2SAT,  in the last 168 hours  A: No apparent complication and currently protecting airway but he is at risk for intubation because of ongoing delirium P:   Monitor    CARDIOVASCULAR No results found for this basename: PROBNP,  in the last 168 hours    Recent Labs Lab 08/06/12 1417 08/06/12 2030 08/07/12 0221  TROPONINI <0.30 <0.30 <0.30    Recent Labs Lab 08/06/12 1648 08/10/12 1145 08/10/12 1200 08/11/12 0406 08/12/12 0446  LATICACIDVEN 1.9  --  1.5  --   --   PROCALCITON  --   0.31  --  0.30 0.20     A: HTN  Neg cardiac enzymes , no evidence of shock -lactate nml . High BP   P:  Monitor  Try hydralazine prn    RENAL  Recent Labs Lab 08/08/12 0405 08/09/12 0400 08/10/12 0445 08/11/12 0406 08/12/12 0446  NA 133* 138 139 137 142  K 3.8 3.4* 3.9 3.3* 3.2*  CL 97 102 104 101 104  CO2 22 25 22 24 26   GLUCOSE 111* 115* 129* 129* 144*  BUN 9 8 10 11 10   CREATININE 0.48* 0.59 0.64 0.66 0.71  CALCIUM 9.2 9.2 9.1 8.7 8.3*  MG 1.8 1.8 1.7 1.6 1.8  PHOS 2.6 4.6 4.0 3.4 3.1   Intake/Output     05/21 0701 - 05/22 0700 05/22 0701 - 05/23 0700   I.V. (mL/kg) 753 (9.5) 152 (1.9)   NG/GT 1230 210   IV Piggyback 260    Total Intake(mL/kg) 2243 (28.2) 362 (4.5)   Urine (mL/kg/hr) 851 (0.4) 35 (0.1)   Stool     Total Output 851 35   Net +1392 +327          A:  NO apparent complication w/ nml scr and good UOP but mild hypomagnesemia and mild low K  P:   K and magnesium sulfate replacement   GASTROINTESTINAL  Recent Labs Lab 08/06/12 0934 08/06/12 1413 08/08/12 0405 08/09/12 0400 08/10/12 0445 08/11/12 0406 08/12/12 0446  AST 92*  --  55* 50* 43* 49* 52*  ALT 40  --  27 29 28 30  35  ALKPHOS 131*  --  128* 131* 128* 128* 125*  BILITOT 2.0*  --  2.5* 1.9* 2.1* 1.7* 1.2  PROT 7.9  --  7.3 7.1 6.9 6.8 6.4  ALBUMIN 3.4*  --  3.0* 2.9* 2.8* 2.7* 2.4*  INR  --  1.28  --  1.42 1.38  --   --     A:  Acute Pancreatitis , evidence on CT , elevated lipase  ETOH Hepatitis with cirrhosis/thrombocytopenia  No evidence of active bleeding, INR nml , hepatitis viral panel neg   - 08/09/2012 & 08/12/12: Lipase has normalized. Abdominal exam is nonfocal  P:    tube feeds since 08/10/12  HEMATOLOGIC  Recent Labs Lab 08/10/12 0445 08/11/12 0406 08/12/12 0446  HGB 15.5 15.3 13.9  HCT 43.2 43.5 41.4  WBC  5.9 5.8 4.7  PLT 30* 36* 31*    Recent Labs Lab 08/06/12 1413 08/09/12 0400 08/10/12 0445  INR 1.28 1.42 1.38    A:  Thrombocytopenia  secondary to Liver dsyfxn. /ETOH  History of DVT? Can't anticoagulate now due to low plt -May 22th 2014: Improving and stabilizing  platelet count P:  SCD , no hep  Monitor closely  Check PLT count in citrated tube Transfuse PLT if bleeding or procedures and PLT count < 50K. otherwise sinuses are less than 10,000 Clarify when DVT occurred when more awake    INFECTIOUS  Recent Labs Lab 08/10/12 1145 08/11/12 0406 08/12/12 0446  PROCALCITON 0.31 0.30 0.20    Recent Labs Lab 08/06/12 1648 08/10/12 1145 08/10/12 1200 08/11/12 0406 08/12/12 0446  LATICACIDVEN 1.9  --  1.5  --   --   PROCALCITON  --  0.31  --  0.30 0.20    Recent Labs Lab 08/08/12 0405 08/09/12 0400 08/10/12 0445 08/11/12 0406 08/12/12 0446  WBC 9.8 5.0 5.9 5.8 4.7     A:  New onset fever on 08/10/2012,. Clinically no evidence of parotid infection.   - 08/11/2012: Fever resolved after starting Zosyn 48 hours earlier. No clear-cut source of fever   P:   Monitor sepsis biomarkers Await Panculture Stop vancomycin Continue Zosyn since 08/10/2012; establish stop date depending on course and pro-CAL   ENDOCRINE A:  Hyperlipidemia , TG nml , TSH nml  P:   Cont to monitor    NEUROLOGIC A:  ETOH w/d , agitation/delirium without focal neurologic findings to suggest bleed  - 08/12/2012: Agitated delirium persists despite Precedex infusion and starting scheduled lactulose 48h hours ago. Has received 24 mg of lorazepam in the last 24 hours which is worse  P:   Continue Precedex since 08/08/2012 Continue scheduled lactulose since 08/10/2012 but increase dosage again 08/12/12 Continue Ativan when necessary but reduce dose   Global 07/2212: Fiancee updated at the bedside daily since 08/09/12  The patient is critically ill with multiple organ systems failure and requires high complexity decision making for assessment and support, frequent evaluation and titration of therapies, application of advanced  monitoring technologies and extensive interpretation of multiple databases.   Critical Care Time devoted to patient care services described in this note is  35  Minutes.  Dr. Kalman Shan, M.D., [facility].C.P Pulmonary and Critical Care Medicine Staff Physician [facility] [place] [facility] Pager: [phone], If no answer or between  15:00h - 7:00h: call [phone]  08/12/2012 10:28 AM

## 2012-08-12 NOTE — Progress Notes (Signed)
Patient complained of throat pain, nurse o'neal inspected and saw that the NG tube was starting to coil in the back of the patient's throat. Upon an attempt to reposition the ng tube the patient was unable to tolerate the procedure. The NG tube was romoved and e-link contacted to consider reinsertion verus use of a small bore feeding tube. Since the patient was now alert and able to follow commands the decision was made by the e-link physician to stop tube feeds and not replace the ng unless the patient became nauseous. The tube feed order was d/c'ed and the ng order d/c'ed as well. E-link said that the decision concerning diet would be covered in the morning during rounds.

## 2012-08-12 NOTE — Progress Notes (Signed)
Due to persistent delirium and needing many meds. Will try clonidine adjunct to precedex. Rx clonidine 0.1mg   q8h   Dr. Kalman Shan, M.D., [facility].C.P Pulmonary and Critical Care Medicine Staff Physician [facility] [place] [facility] Critical [facility] Pager: [phone], If no answer or between  15:00h - 7:00h: call [phone]  08/12/2012 1:57 PM

## 2012-08-13 LAB — CBC WITH DIFFERENTIAL/PLATELET
Basophils Relative: 0 % (ref 0–1)
Eosinophils Absolute: 0.1 10*3/uL (ref 0.0–0.7)
Eosinophils Relative: 2 % (ref 0–5)
HCT: 43.8 % (ref 39.0–52.0)
Hemoglobin: 14.4 g/dL (ref 13.0–17.0)
MCH: 31.3 pg (ref 26.0–34.0)
MCHC: 32.9 g/dL (ref 30.0–36.0)
MCV: 95.2 fL (ref 78.0–100.0)
Monocytes Absolute: 1 10*3/uL (ref 0.1–1.0)
Monocytes Relative: 21 % — ABNORMAL HIGH (ref 3–12)
Neutro Abs: 2.7 10*3/uL (ref 1.7–7.7)

## 2012-08-13 LAB — COMPREHENSIVE METABOLIC PANEL
Albumin: 2.6 g/dL — ABNORMAL LOW (ref 3.5–5.2)
BUN: 13 mg/dL (ref 6–23)
Calcium: 8.7 mg/dL (ref 8.4–10.5)
Chloride: 109 mEq/L (ref 96–112)
Creatinine, Ser: 0.69 mg/dL (ref 0.50–1.35)
Total Bilirubin: 1.4 mg/dL — ABNORMAL HIGH (ref 0.3–1.2)

## 2012-08-13 LAB — AMMONIA: Ammonia: 45 umol/L (ref 11–60)

## 2012-08-13 MED ORDER — FLUCONAZOLE 100 MG PO TABS
100.0000 mg | ORAL_TABLET | Freq: Every day | ORAL | Status: DC
  Administered 2012-08-13 – 2012-08-17 (×5): 100 mg via ORAL
  Filled 2012-08-13 (×5): qty 1

## 2012-08-13 MED ORDER — MENTHOL 3 MG MT LOZG
1.0000 | LOZENGE | OROMUCOSAL | Status: DC | PRN
  Administered 2012-08-13 – 2012-08-14 (×2): 3 mg via ORAL
  Filled 2012-08-13: qty 9

## 2012-08-13 MED ORDER — LEVOFLOXACIN 500 MG PO TABS
500.0000 mg | ORAL_TABLET | Freq: Every day | ORAL | Status: DC
  Administered 2012-08-13 – 2012-08-17 (×5): 500 mg via ORAL
  Filled 2012-08-13 (×5): qty 1

## 2012-08-13 MED ORDER — LORAZEPAM 1 MG PO TABS
1.0000 mg | ORAL_TABLET | Freq: Four times a day (QID) | ORAL | Status: DC | PRN
  Administered 2012-08-14 – 2012-08-15 (×4): 1 mg via ORAL
  Filled 2012-08-13 (×5): qty 1

## 2012-08-13 MED ORDER — LORAZEPAM 2 MG/ML IJ SOLN: 1.0000 mg | Freq: Four times a day (QID) | INTRAMUSCULAR | Status: DC | PRN

## 2012-08-13 NOTE — Evaluation (Signed)
Clinical/Bedside Swallow Evaluation Patient Details  Name: [name] MRN: [id number] Date of Birth: 1970-11-30  Today's Date: 08/13/2012 Time: 4098-1191 SLP Time Calculation (min): 14 min  Past Medical History:  Past Medical History  Diagnosis Date  . DVT (deep venous thrombosis)   . Anxiety   . Alcoholic   . Hypertension    Past Surgical History:  Past Surgical History  Procedure Laterality Date  . Tonsillectomy     HPI:  42 y.o. Male with hx alcoholism, hx DVT, HTN, anxiety disorder, presented with abdominal pain, with acute pancreatitis, alcoholic hepatitis, thrombocytopenia.  ETOH w/d , agitation/delirium .  NG removed and BSE ordered.   Assessment / Plan / Recommendation Clinical Impression  Pt presents with normal oropharyngeal swallow with normal mastication, swift swallow trigger, and seemingly adequate airway protection with all tested consistencies.  C/o odynophagia mid pharynx.  Pt with sluggish responses, but appropriate.  Recommend initiating regular diet with thin liquids  SLP to sign-off.     Aspiration Risk  Mild    Diet Recommendation Regular;Thin liquid   Liquid Administration via: Cup;Straw Medication Administration: Whole meds with liquid Supervision: Patient able to self feed    Other  Recommendations     Follow Up Recommendations  None        Swallow Study Prior Functional Status       General HPI: 41 yr. Old WM w/ hx alcoholism, hx DVT, HTN, anxiety disorder, presented with abdominal pain, with acute pancreatitis, alcoholic hepatitis, thrombocytopenia.  ETOH w/d , agitation/delirium .  NG removed and BSE ordered. Type of Study: Bedside swallow evaluation Diet Prior to this Study: NPO Temperature Spikes Noted: No Respiratory Status: Room air History of Recent Intubation: No Behavior/Cognition: Alert Oral Cavity - Dentition: Adequate natural dentition Self-Feeding Abilities: Able to feed self Patient Positioning: Upright in  chair Baseline Vocal Quality: Clear;Low vocal intensity Volitional Cough: Strong Volitional Swallow: Able to elicit    Oral/Motor/Sensory Function Overall Oral Motor/Sensory Function: Appears within functional limits for tasks assessed   Ice Chips Ice chips: Within functional limits   Thin Liquid Thin Liquid: Within functional limits    Nectar Thick Nectar Thick Liquid: Not tested   Honey Thick Honey Thick Liquid: Not tested   Puree Puree: Within functional limits   Solid   [name] L. [name], [place] CCC/SLP Pager [phone]     Solid: Within functional limits       [name] [name] 08/13/2012,2:46 PM

## 2012-08-13 NOTE — Evaluation (Signed)
Physical Therapy Evaluation Patient Details Name: [name] MRN: [id number] DOB: July 15, 1970 Today's Date: 08/13/2012 Time: 4098-1191 PT Time Calculation (min): 22 min  PT Assessment / Plan / Recommendation Clinical Impression  42 y.o. male admitted to [facility] with hx alcoholism, hx DVT, HTN, anxiety disorder, presented with abdominal pain, with acute pancreatitis, alcoholic hepatitis, thrombocytopenia.  ETOH w/d , agitation/delirium.  He presents today generally weak and deconditioned and reports diarrhea all day.  He will likely progress well with increased mobility.  I encouraged him to walk with staff and/or family over the weekend on the unit.  PT will f/u if he is still here on Monday to check balance and assess gait further.  Pt will likely not need f/u PT at discharge.      PT Assessment  Patient needs continued PT services    Follow Up Recommendations  No PT follow up    Does the patient have the potential to tolerate intense rehabilitation      Barriers to Discharge Decreased caregiver support fiancee works days    Equipment Recommendations  None recommended by PT    Recommendations for Other Services   none  Frequency Min 3X/week    Precautions / Restrictions Precautions Precautions: Fall Precaution Comments: midly unsteady on his feet   Pertinent Vitals/Pain See vitals flow sheet.       Mobility  Bed Mobility Bed Mobility: Supine to Sit;Sitting - Scoot to Edge of Bed;Sit to Supine Supine to Sit: 6: Modified independent (Device/Increase time);With rails Sitting - Scoot to Edge of Bed: 6: Modified independent (Device/Increase time);With rail Sit to Supine: 6: Modified independent (Device/Increase time);With rail;HOB flat Details for Bed Mobility Assistance: reliance on railing to get to sitting Transfers Transfers: Sit to Stand;Stand to Sit Sit to Stand: 5: Supervision Stand to Sit: 5: Supervision Details for Transfer Assistance: supervision for  safety Ambulation/Gait Ambulation/Gait Assistance: 4: Min guard Ambulation Distance (Feet): 15 Feet Assistive device: None Ambulation/Gait Assistance Details: min guard assit due to midly staggering gait pattern.  Pt's feet got tangled when turning around at the door.   Gait Pattern: Step-through pattern (staggering) General Gait Details: pt did not want to walk into the hallway due to diarrhea issues today he did not want to get too far from the toilet.          PT Diagnosis: Difficulty walking;Abnormality of gait;Generalized weakness;Acute pain;Altered mental status  PT Problem List: Decreased strength;Decreased activity tolerance;Decreased balance;Decreased mobility;Decreased cognition;Pain PT Treatment Interventions: DME instruction;Gait training;Stair training;Functional mobility training;Therapeutic activities;Therapeutic exercise;Balance training;Neuromuscular re-education;Cognitive remediation;Patient/family education   PT Goals Acute Rehab PT Goals PT Goal Formulation: With patient Time For Goal Achievement: 08/27/12 Potential to Achieve Goals: Good Pt will go Supine/Side to Sit: Independently PT Goal: Supine/Side to Sit - Progress: Goal set today Pt will go Sit to Supine/Side: Independently PT Goal: Sit to Supine/Side - Progress: Goal set today Pt will go Sit to Stand: Independently PT Goal: Sit to Stand - Progress: Goal set today Pt will go Stand to Sit: Independently PT Goal: Stand to Sit - Progress: Goal set today Pt will Transfer Bed to Chair/Chair to Bed: Independently PT Transfer Goal: Bed to Chair/Chair to Bed - Progress: Goal set today Pt will Ambulate: >150 feet;Independently PT Goal: Ambulate - Progress: Goal set today Pt will Go Up / Down Stairs: 3-5 stairs;with modified independence;with least restrictive assistive device PT Goal: Up/Down Stairs - Progress: Goal set today Additional Goals Additional Goal #1: Pt will score >50/56 on his [employer]  test  indicating low risk for falls (<25%).  PT Goal: Additional Goal #1 - Progress: Goal set today  Visit Information  Last PT Received On: 08/13/12 Assistance Needed: +1    Subjective Data  Subjective: Pt reports he doesn't feel well today.  He is having diarrhea.   Patient Stated Goal: to feel better, go home.     Prior Functioning  Home Living Lives With: Significant other Available Help at Discharge: Family;Available PRN/intermittently Type of Home: House Home Layout: One level Home Adaptive Equipment: None Prior Function Level of Independence: Independent Able to Take Stairs?: Yes Driving: Yes Vocation: Part time employment Comments: self-employeed after he got laid off 2 years ago. 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   Communication Communication: No difficulties (mildly slow, slurred speech)    Cognition  Cognition Arousal/Alertness: Lethargic Behavior During Therapy: Flat affect Overall Cognitive Status: Impaired/Different from baseline Area of Impairment: Problem solving Problem Solving: Slow processing General Comments: generally slow to process, doesn't always catch everything I am asking him to do.  Lethargic.      Extremity/Trunk Assessment Right Upper Extremity Assessment RUE ROM/Strength/Tone: Within functional levels Left Upper Extremity Assessment LUE ROM/Strength/Tone: Within functional levels Right Lower Extremity Assessment RLE ROM/Strength/Tone: Deficits RLE ROM/Strength/Tone Deficits: generally weak 4/5 throughout Left Lower Extremity Assessment LLE ROM/Strength/Tone: Deficits LLE ROM/Strength/Tone Deficits: generally weak 4/5 throughout.     Balance Static Sitting Balance Static Sitting - Balance Support: No upper extremity supported;Feet supported Static Sitting - Level of Assistance: 7: Independent Static Standing Balance Static Standing - Balance Support: No upper extremity supported Static Standing -  Level of Assistance: 5: Stand by assistance Static Standing - Comment/# of Minutes: bil legs resting on bed for support  End of Session PT - End of Session Activity Tolerance: Patient limited by fatigue Patient left: in bed;with call bell/phone within reach;with bed alarm set;with family/visitor present (parents came in at end of session)    Lurena Joiner B. Gara Kincade, PT, DPT [phone]   08/13/2012, 5:10 PM

## 2012-08-13 NOTE — Progress Notes (Signed)
PULMONARY  / CRITICAL CARE MEDICINE  Name: [name] MRN: [id number] DOB: 01/22/1971    ADMISSION DATE:  08/06/2012 CONSULTATION DATE:  08/08/12  REFERRING MD :  IM teaching service  PRIMARY SERVICE:    CHIEF COMPLAINT:  ETOH w/d     HISTORY OF PRESENT ILLNESS:    41 yr. 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 He states he drinks about a 12 pack of beer a day for years. He stated he was having generalized abdominal pain mostly over upper abdomen that did not improve with pepto bismol. He states he's had this pain before but it has usually gone away on its own. He has not been taking any of his medications. He admits to some recent bloody noses and easy bruising but no significant bleeding. He denies melena, hematochezia, hematuria.  He was noted to have evidence of alcoholic hepatitis on admission with AST 92 ALT 40, thrombocytopenia of 13k, and an alcohol level of 198. CT of the abdomen suggested pancreatitis as well as some lesions scattered throughout his liver that need follow up with MRI.      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 Has been to several detox and rehab programs in past without lasting sobriety.   No recent known injuries, or falls. He has had worsening agitation, confusion and combativeness despite CIWA protocol. No improvement after multiple doses of ativan IV, and oral doses, and librium.   CCM consulted for consideration of precedex drip and assistance with ETOH w/d symptoms.   Pt has peroids of confusion .  He denies abd pain, +nausea. He is sitting up in bed eating clear liquid breakfast, Confusion to date and place. Agitated to stimulation . Combative to staff.    LINES / TUBES:  CULTURES:  Recent Labs Lab 08/06/12 1648 08/10/12 1145 08/10/12 1200 08/11/12 0406 08/12/12 0446  LATICACIDVEN 1.9  --  1.5  --    --   PROCALCITON  --  0.31  --  0.30 0.20    Recent Labs Lab 08/10/12 1145 08/11/12 0406 08/12/12 0446  PROCALCITON 0.31 0.30 0.20     ANTIBIOTICS: Anti-infectives   Start     Dose/Rate Route Frequency Ordered Stop   08/10/12 1400  piperacillin-tazobactam (ZOSYN) IVPB 3.375 g     3.375 g 12.5 mL/hr over 240 Minutes Intravenous 3 times per day 08/10/12 1109     08/10/12 1200  vancomycin (VANCOCIN) IVPB 1000 mg/200 mL premix  Status:  Discontinued     1,000 mg 200 mL/hr over 60 Minutes Intravenous 4 times per day 08/10/12 1109 08/11/12 1203   08/09/12 1300  vancomycin (VANCOCIN) IVPB 1000 mg/200 mL premix  Status:  Discontinued     1,000 mg 200 mL/hr over 60 Minutes Intravenous Every 8 hours 08/09/12 0951 08/10/12 1109   08/08/12 1245  vancomycin (VANCOCIN) IVPB 1000 mg/200 mL premix  Status:  Discontinued     1,000 mg 200 mL/hr over 60 Minutes Intravenous Every 8 hours 08/08/12 1233 08/09/12 0951        SIGNIFICANT EVENTS / STUDIES:  5/16 CT abd /pelvis >Subtle stranding about the tail of the pancreas suggestive of acute uncomplicated pancreatitis.   Stigmata of cirrhosis and portal venous hypertension, multiple hypoattenuating lesions scattered throughout  the liver  >stigmata of cirrhosis 5/18 Transferred to ICU precedex drip   08/09/2012: Continues on Precedex infusion  but slightly more agitated since 9:30 AM. Requiring intermittent Ativan. Fianc and 2 friends or the bedside and per nursing more agitated after that presents. Required 4 mg Ativan to calm him down  08/10/12: Continued agitated delirium. Has required 60 mg of Ativan in the last 24 hours along with continuous infusion of Precedex. Has not been able to get lactulose. Has new onset fever 100.9 Fahrenheit. Platelet count improving  08/11/2012: Fever curve improved after expanding antibiotic coverage yesterday. Continued delirium despite Precedex, Ativan when necessary and starting scheduled lactulose yesterday.  Platelet count is improving.  08/12/12: added clonidine 0.1mg  q8h in addition to precedex and lactulasse. 08/13/12: off Precedex since 7:00 AM, patient is less agitated. RASS 0   SUBJECTIVE/OVERNIGHT/INTERVAL HX 08/12/2012: Continued agitation despite Precedex and lactulose. He has received 24 mg of Ativan in 24 hours. 08/13/12: received 10 mg of Ativan in 5/22.    VITAL SIGNS: Temp:  [98 F (36.7 C)-100.6 F (38.1 C)] 98.1 F (36.7 C) (05/23 0826) Pulse Rate:  [58-79] 65 (05/23 0900) Resp:  [17-24] 19 (05/23 0900) BP: (100-169)/(67-108) 120/77 mmHg (05/23 0900) SpO2:  [94 %-100 %] 100 % (05/23 0900) HEMODYNAMICS:   VENTILATOR SETTINGS:   INTAKE / OUTPUT: Intake/Output     05/22 0701 - 05/23 0700 05/23 0701 - 05/24 0700   P.O. 0 100   I.V. (mL/kg) 632 (7.9) 68 (0.9)   NG/GT 770    IV Piggyback 200    Total Intake(mL/kg) 1602 (20.1) 168 (2.1)   Urine (mL/kg/hr) 1235 (0.6)    Stool 600 (0.3)    Total Output 1835     Net -233 +168        Stool Occurrence 8 x      PHYSICAL EXAMINATION: General: Critically ill looking male on the bed in the ICU. No ventilator Neuro:  RASS sedation score 0, off precedex, on clonidin and prn ativan. HEENT:  Dry mucosa . Bilateral parotid gland enlargement do to cirrhosis Cardiovascular:  NSR , no m/r/g , no edema  Lungs:  Diminshed BS in bases  Abdomen:  Mild Distended, +hepatospleenomegaly, BS +, mild tender diffusely Musculoskeletal:  Intact, MAEWx 4   Skin:  Intact w/ no rash or breakdown. Has spider nevi on his chest.  LABS: - See individual assessment and plan   Imaging x48 hours No results found.   ASSESSMENT / PLAN:  PULMONARY No results found for this basename: PHART, PCO2, PCO2ART, PO2, PO2ART, HCO3, TCO2, O2SAT,  in the last 168 hours  A: No apparent complication and currently protecting airway. Throat pain and has thrush on exam P:   - Monitor respiratory function  - oral diflucan for 7 days. - oral cepacol for  throat pain  CARDIOVASCULAR No results found for this basename: PROBNP,  in the last 168 hours    Recent Labs Lab 08/06/12 1417 08/06/12 2030 08/07/12 0221  TROPONINI <0.30 <0.30 <0.30    Recent Labs Lab 08/06/12 1648 08/10/12 1145 08/10/12 1200 08/11/12 0406 08/12/12 0446  LATICACIDVEN 1.9  --  1.5  --   --   PROCALCITON  --  0.31  --  0.30 0.20    A: HTN  Neg cardiac enzymes , no evidence of shock -lactate nml . BP  Normal now  P:  Monitor  Try hydralazine prn   RENAL  Recent Labs Lab 08/09/12 0400 08/10/12 0445 08/11/12 0406 08/12/12 0446 08/13/12 0530  NA 138 139 137 142 142  K 3.4* 3.9 3.3* 3.2* 3.6  CL 102  104 101 104 109  CO2 25 22 24 26 22   GLUCOSE 115* 129* 129* 144* 132*  BUN 8 10 11 10 13   CREATININE 0.59 0.64 0.66 0.71 0.69  CALCIUM 9.2 9.1 8.7 8.3* 8.7  MG 1.8 1.7 1.6 1.8 2.0  PHOS 4.6 4.0 3.4 3.1 4.0   Intake/Output     05/22 0701 - 05/23 0700 05/23 0701 - 05/24 0700   P.O. 0 100   I.V. (mL/kg) 632 (7.9) 68 (0.9)   NG/GT 770    IV Piggyback 200    Total Intake(mL/kg) 1602 (20.1) 168 (2.1)   Urine (mL/kg/hr) 1235 (0.6)    Stool 600 (0.3)    Total Output 1835     Net -233 +168        Stool Occurrence 8 x      A:  NO apparent complication w/ nml scr and good UOP. Mg and phos normal. P:   - check electrolyte daily   GASTROINTESTINAL  Recent Labs Lab 08/06/12 1413  08/09/12 0400 08/10/12 0445 08/11/12 0406 08/12/12 0446 08/13/12 0530  AST  --   < > 50* 43* 49* 52* 65*  ALT  --   < > 29 28 30  35 49  ALKPHOS  --   < > 131* 128* 128* 125* 133*  BILITOT  --   < > 1.9* 2.1* 1.7* 1.2 1.4*  PROT  --   < > 7.1 6.9 6.8 6.4 6.5  ALBUMIN  --   < > 2.9* 2.8* 2.7* 2.4* 2.6*  INR 1.28  --  1.42 1.38  --   --   --   < > = values in this interval not displayed.  A:  Acute Pancreatitis , evidence on CT , elevated lipase  ETOH Hepatitis with cirrhosis/thrombocytopenia  No evidence of active bleeding, INR 1.38 , hepatitis viral panel  neg   - 08/09/2012 & 08/12/12: Lipase has normalized. Abdominal exam is nonfocal. Tube off, has throat pain. 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 /ETOH  History of DVT? Can't anticoagulate now due to low plt -May 22th 2014: Improving and stabilizing  platelet count, today platelet not reported due to clumps P:  SCD , no hep  Monitor closely  Check PLT count in citrated tube Transfuse PLT if bleeding or procedures and PLT count < 50K.  Clarify when DVT occurred when more awake  INFECTIOUS  Recent Labs Lab 08/10/12 1145 08/11/12 0406 08/12/12 0446  PROCALCITON 0.31 0.30 0.20    Recent Labs Lab 08/06/12 1648 08/10/12 1145 08/10/12 1200 08/11/12 0406 08/12/12 0446  LATICACIDVEN 1.9  --  1.5  --   --   PROCALCITON  --  0.31  --  0.30 0.20    Recent Labs Lab 08/09/12 0400 08/10/12 0445 08/11/12 0406 08/12/12 0446 08/13/12 0530  WBC 5.0 5.9 5.8 4.7 4.8     A:  New onset fever on 08/10/2012,. Clinically no evidence of parotid infection. Tm=100.6. UA negative on 5/16   - 08/11/2012: Fever resolved after starting Zosyn 48 hours  earlier. No clear-cut source of fever   P:   Monitor sepsis biomarkers Await Panculture Stoped vancomycin D/c Zosyn since 08/10/2012; establish stop date depending on course and pro-CAL  Start oral Levaquin 500 mg daily for 7 days of antibiotics (count from 5/20).  ENDOCRINE A:  Hyperlipidemia , TG nml , TSH nml  P:   Cont to monitor    NEUROLOGIC A:  ETOH w/d , agitation/delirium without focal neurologic findings to suggest bleed  -  08/12/2012: Agitated delirium persists despite Precedex infusion and starting scheduled lactulose. Has received 10 mg of lorazepam in the last 24 hours.  - 08/12/12: started clonidin 0.1mg  q8h. Patient is less agitated. precedex off since 7:00 AM. Doing well now.  P:   Continue scheduled lactulose since 08/10/2012 but increase dosage again 08/12/12 Continue Ativan when necessary but reduce dose Continue clonidine   GLOBAL A: deconditioned P PT consult. Tx to teaching service  Global 08/13/12: Steffanie Rainwater updated at the bedside daily since 08/09/12  Disposition: patient was transferred to ICU from internal medicine teaching service, spoke with on-call resident who agreed to transfer patient back to IMT. Will transfer to tele bed.    Staff note  - I personally evaluated patient. Updated mother and wife and another family member. He significantly improved after starting clonidine yesterday. This is helped reduce the amount of when necessary Ativan and help him come off Precedex. For now we will continue scheduled Klonodine. We will schedule to oral antibiotics. Rest per assessment and plan of teaching service resident who I supervised    Dr. Kalman Shan, M.D., [facility].C.P Pulmonary and Critical Care Medicine Staff Physician [facility] Pager: [phone], If no answer or between  15:00h - 7:00h: call [phone]  08/13/2012 9:56 AM

## 2012-08-13 NOTE — Progress Notes (Signed)
NUTRITION FOLLOW UP  Intervention:    Recommend advance diet as tolerated to Regular diet.  If swallowing problems suspected, recommend SLP consult to determine safest diet.  Nutrition Dx:   Inadequate oral intake related to inability to eat with altered mental status as evidenced by NPO status. Ongoing.  Goal:   Intake to meet >90% of estimated nutrition needs. Unmet with TF off and NPO.  Monitor:   PO intake as diet is advanced, labs, weight trend.  Assessment:   Patient remains NPO. NGT was removed this AM because it was coiled in the patient's throat, causing pain, and RN unable to reposition tube. Patient is more alert, likely able to tolerate PO diet now.   Height: Ht Readings from Last 1 Encounters:  08/06/12 5\' 7"  (1.702 m)    Weight Status:   Wt Readings from Last 1 Encounters:  08/12/12 175 lb 7.8 oz (79.6 kg)  08/10/12  166 lb 7.2 oz (75.5 kg)    Re-estimated needs:  Kcal: 1950-2050 Protein: 90-100 gm Fluid: >/= 2 L  Skin: no problems  Diet Order: NPO   Intake/Output Summary (Last 24 hours) at 08/13/12 0912 Last data filed at 08/13/12 0600  Gross per 24 hour  Intake   1354 ml  Output   1800 ml  Net   -446 ml    Last BM: 5/21   Labs:   Recent Labs Lab 08/11/12 0406 08/12/12 0446 08/13/12 0530  NA 137 142 142  K 3.3* 3.2* 3.6  CL 101 104 109  CO2 24 26 22   BUN 11 10 13   CREATININE 0.66 0.71 0.69  CALCIUM 8.7 8.3* 8.7  MG 1.6 1.8 2.0  PHOS 3.4 3.1 4.0  GLUCOSE 129* 144* 132*    CBG (last 3)   Recent Labs  08/11/12 1212 08/11/12 1945  GLUCAP 169* 158*    Scheduled Meds: . antiseptic oral rinse  15 mL Mouth Rinse q12n4p  . cloNIDine  0.1 mg Per NG tube Q8H  . folic acid  1 mg Oral Daily  . lactulose  30 g Oral TID  . multivitamin with minerals  1 tablet Oral Daily  . pantoprazole sodium  40 mg Per Tube Daily  . piperacillin-tazobactam (ZOSYN)  IV  3.375 g Intravenous Q8H  . sodium chloride  3 mL Intravenous Q12H  . sodium  chloride  3 mL Intravenous Q12H  . thiamine  100 mg Oral Daily   Or  . thiamine  100 mg Intravenous Daily    Continuous Infusions: . sodium chloride 20 mL/hr at 08/12/12 1800  . dexmedetomidine 0.2 mcg/kg/hr (08/12/12 2214)    Joaquin Courts, RD, LDN, CNSC Pager [phone] After Hours Pager [phone]

## 2012-08-14 DIAGNOSIS — E785 Hyperlipidemia, unspecified: Secondary | ICD-10-CM

## 2012-08-14 DIAGNOSIS — E876 Hypokalemia: Secondary | ICD-10-CM

## 2012-08-14 LAB — BASIC METABOLIC PANEL
BUN: 13 mg/dL (ref 6–23)
Chloride: 109 mEq/L (ref 96–112)
GFR calc Af Amer: >90 mL/min (ref >90)
Potassium: 3.7 mEq/L (ref 3.5–5.1)
Sodium: 142 mEq/L (ref 135–145)

## 2012-08-14 LAB — CBC WITH DIFFERENTIAL/PLATELET
Basophils Relative: 1 % (ref 0–1)
Hemoglobin: 14 g/dL (ref 13.0–17.0)
MCHC: 33.7 g/dL (ref 30.0–36.0)
Monocytes Relative: 16 % — ABNORMAL HIGH (ref 3–12)
Neutro Abs: 4.4 10*3/uL (ref 1.7–7.7)
Neutrophils Relative %: 62 % (ref 43–77)
Platelets: 63 10*3/uL — ABNORMAL LOW (ref 150–400)
RBC: 4.38 MIL/uL (ref 4.22–5.81)

## 2012-08-14 LAB — HIV ANTIBODY (ROUTINE TESTING W REFLEX): HIV: NONREACTIVE

## 2012-08-14 LAB — PHOSPHORUS: Phosphorus: 4 mg/dL (ref 2.3–4.6)

## 2012-08-14 LAB — MAGNESIUM: Magnesium: 1.9 mg/dL (ref 1.5–2.5)

## 2012-08-14 MED ORDER — ONDANSETRON HCL 4 MG/2ML IJ SOLN: 4.0000 mg | Freq: Three times a day (TID) | INTRAMUSCULAR | Status: DC | PRN

## 2012-08-14 MED ORDER — HYDROCODONE-ACETAMINOPHEN 5-325 MG PO TABS
1.0000 | ORAL_TABLET | ORAL | Status: DC | PRN
  Administered 2012-08-14 – 2012-08-16 (×4): 1 via ORAL
  Filled 2012-08-14: qty 1
  Filled 2012-08-14: qty 2
  Filled 2012-08-14 (×2): qty 1

## 2012-08-14 MED ORDER — PANTOPRAZOLE SODIUM 40 MG PO TBEC
40.0000 mg | DELAYED_RELEASE_TABLET | Freq: Every day | ORAL | Status: DC
  Administered 2012-08-14 – 2012-08-16 (×3): 40 mg via ORAL
  Filled 2012-08-14 (×3): qty 1

## 2012-08-14 MED ORDER — CLONIDINE HCL 0.1 MG PO TABS
0.1000 mg | ORAL_TABLET | Freq: Three times a day (TID) | ORAL | Status: DC
  Administered 2012-08-14 (×2): 0.1 mg via ORAL
  Filled 2012-08-14 (×6): qty 1

## 2012-08-14 MED ORDER — HYDROCHLOROTHIAZIDE 12.5 MG PO CAPS
12.5000 mg | ORAL_CAPSULE | Freq: Every day | ORAL | Status: DC
  Administered 2012-08-14 – 2012-08-17 (×4): 12.5 mg via ORAL
  Filled 2012-08-14 (×4): qty 1

## 2012-08-14 NOTE — Progress Notes (Signed)
Subjective: Brief ICU course: Pt was admitted on 5/16 for uncomplicated acute pancreatitis with known heavy EtOH use and consumption on the morning of admission. Pt then developed DT and was transferred on 08/08/12 to ICU for precedex and EtOH withdrawal management. Pt continued on precedex and required high doses of ativan for DT. Lactulose was also started on 5/20 with some improvement in symptoms but agitation further required clonidine on 08/13/22. Pt did have 100.9 F during admission and full infectious workup has been negative.   Pt doing well this AM. Just some nausea. Complaining of some tremors on occasion. pts father present in room.   Objective: Vital signs in last 24 hours: Filed Vitals:   08/13/12 1600 08/13/12 1700 08/13/12 2137 08/14/12 0553  BP: 113/90 122/72 152/84 150/86  Pulse: 67 66 60 61  Temp:  98.8 F (37.1 C) 98.8 F (37.1 C) 98.8 F (37.1 C)  TempSrc:  Oral Oral Oral  Resp: 21 20 18 18   Height:      Weight:      SpO2: 97% 92% 99% 96%   Weight change:   Intake/Output Summary (Last 24 hours) at 08/14/12 0746 Last data filed at 08/13/12 1900  Gross per 24 hour  Intake 590.67 ml  Output      0 ml  Net 590.67 ml   General: resting in bed, NAD HEENT: PERRL, EOMI, no scleral icterus Cardiac: RRR, no rubs, murmurs or gallops Pulm: clear to auscultation bilaterally, moving normal volumes of air Abd: soft, nontender, nondistended, BS present Ext: warm and well perfused, no pedal edema Neuro: alert and oriented X3, cranial nerves II-XII grossly intact, minimal asterxisis, no tremors   Lab Results: Basic Metabolic Panel:  Recent Labs Lab 08/13/12 0530 08/14/12 0515  NA 142 142  K 3.6 3.7  CL 109 109  CO2 22 22  GLUCOSE 132* 109*  BUN 13 13  CREATININE 0.69 0.76  CALCIUM 8.7 8.8  MG 2.0 1.9  PHOS 4.0 4.0   Liver Function Tests:  Recent Labs Lab 08/12/12 0446 08/13/12 0530  AST 52* 65*  ALT 35 49  ALKPHOS 125* 133*  BILITOT 1.2 1.4*  PROT 6.4  6.5  ALBUMIN 2.4* 2.6*    Recent Labs Lab 08/09/12 0400  LIPASE 24  AMYLASE 53    Recent Labs Lab 08/10/12 0445 08/13/12 0530  AMMONIA 63* 45   CBC:  Recent Labs Lab 08/13/12 0530 08/14/12 0515  WBC 4.8 7.2  NEUTROABS 2.7 4.4  HGB 14.4 14.0  HCT 43.8 41.5  MCV 95.2 94.7  PLT PLATELET CLUMPS NOTED ON SMEAR, UNABLE TO ESTIMATE 63*   CBG:  Recent Labs Lab 08/08/12 1032 08/11/12 1212 08/11/12 1945  GLUCAP 119* 169* 158*   Thyroid Function Tests:  Recent Labs Lab 08/07/12 0815  TSH 2.130   Coagulation:  Recent Labs Lab 08/09/12 0400 08/10/12 0445  LABPROT 17.0* 16.6*  INR 1.42 1.38   Micro Results: Recent Results (from the past 240 hour(s))  URINE CULTURE     Status: None   Collection Time    08/06/12  9:45 AM      Result Value Range Status   Specimen Description URINE, RANDOM   Final   Special Requests NONE   Final   Culture  Setup Time 08/06/2012 15:03   Final   Colony Count NO GROWTH   Final   Culture NO GROWTH   Final   Report Status 08/07/2012 FINAL   Final  MRSA PCR SCREENING  Status: None   Collection Time    08/08/12 10:24 AM      Result Value Range Status   MRSA by PCR NEGATIVE  NEGATIVE Final   Comment:            The GeneXpert MRSA Assay (FDA     approved for NASAL specimens     only), is one component of a     comprehensive MRSA colonization     surveillance program. It is not     intended to diagnose MRSA     infection nor to guide or     monitor treatment for     MRSA infections.  URINE CULTURE     Status: None   Collection Time    08/10/12 11:40 AM      Result Value Range Status   Specimen Description URINE, CATHETERIZED   Final   Special Requests NONE   Final   Culture  Setup Time 08/10/2012 18:32   Final   Colony Count NO GROWTH   Final   Culture NO GROWTH   Final   Report Status 08/11/2012 FINAL   Final  CULTURE, BLOOD (ROUTINE X 2)     Status: None   Collection Time    08/10/12 11:45 AM      Result Value  Range Status   Specimen Description BLOOD RIGHT ARM   Final   Special Requests BOTTLES DRAWN AEROBIC ONLY 5CC   Final   Culture  Setup Time 08/10/2012 17:31   Final   Culture     Final   Value:        BLOOD CULTURE RECEIVED NO GROWTH TO DATE CULTURE WILL BE HELD FOR 5 DAYS BEFORE ISSUING A FINAL NEGATIVE REPORT   Report Status PENDING   Incomplete  CULTURE, BLOOD (ROUTINE X 2)     Status: None   Collection Time    08/10/12 12:00 PM      Result Value Range Status   Specimen Description BLOOD RIGHT ARM   Final   Special Requests BOTTLES DRAWN AEROBIC ONLY 5CC   Final   Culture  Setup Time 08/10/2012 17:30   Final   Culture     Final   Value:        BLOOD CULTURE RECEIVED NO GROWTH TO DATE CULTURE WILL BE HELD FOR 5 DAYS BEFORE ISSUING A FINAL NEGATIVE REPORT   Report Status PENDING   Incomplete   Studies/Results: No results found. Medications: I have reviewed the patient's current medications. Scheduled Meds: . antiseptic oral rinse  15 mL Mouth Rinse q12n4p  . cloNIDine  0.1 mg Per NG tube Q8H  . fluconazole  100 mg Oral Daily  . folic acid  1 mg Oral Daily  . lactulose  30 g Oral TID  . levofloxacin  500 mg Oral Daily  . multivitamin with minerals  1 tablet Oral Daily  . pantoprazole sodium  40 mg Per Tube Daily  . sodium chloride  3 mL Intravenous Q12H  . sodium chloride  3 mL Intravenous Q12H  . thiamine  100 mg Oral Daily   Or  . thiamine  100 mg Intravenous Daily   Continuous Infusions: . sodium chloride 20 mL/hr at 08/13/12 1100   PRN Meds:.sodium chloride, artificial tears, hydrALAZINE, LORazepam, LORazepam, LORazepam, menthol-cetylpyridinium, sodium chloride Assessment/Plan: # Acute uncomplicated pancreatitis-resolved: on admission pt had elevated lipase in 140s that has now normalized and CT abd findings consistent with uncomplicated pancreatitis. Pt was given TF during ICU stay  but has now transitioned to oral diet and tolerating well.  -cont advanced diet -zofran  prn nausea -PT/OT eval  # EtOH withdrawal vs Acute encephalopathy: Pt known heavy EtOH use and been in and out of inpt and outpt rehab/detox centers without success. Ammonia level increased at 63 during ICU stay. Required precedex, clonidine, ativan, and lactulose for control of DTs. Extensive education in terms of alcohol cessation and sequale already present as related to continued EtOH use had with patient and pt father this AM.  -CIWA for another 24 hrs -ativan prn -clonidine -lactulose   -CSW did see pt in ICU but no resources needed/provided and signed off  -readdress rehab with pt  # HTN: ACS workup negative had no evidence of shock while in ICU. SBP 150s this AM.  -HCTZ 12.5mg  -hydralzine prn for SBP >180  # Thromobocytopenia 2/2 liver cirrhosis 2/2 EtOH abuse: on admission 11k but steady increase to 63k this AM.  -SCDs  Dispo: Disposition is deferred at this time, awaiting improvement of current medical problems.  Anticipated discharge in approximately 1-2 day(s).   The patient does not have a current PCP (No primary provider on file.), therefore will not be requiring OPC follow-up after discharge.   The patient does not have transportation limitations that hinder transportation to clinic appointments.  .Services Needed at time of discharge: Y = Yes, Blank = No PT:   OT:   RN:   Equipment:   Other:     LOS: 8 days   Christen Bame 08/14/2012, 7:46 AM Pgr: [phone]

## 2012-08-15 DIAGNOSIS — E876 Hypokalemia: Secondary | ICD-10-CM | POA: Diagnosis not present

## 2012-08-15 DIAGNOSIS — E785 Hyperlipidemia, unspecified: Secondary | ICD-10-CM | POA: Diagnosis present

## 2012-08-15 LAB — CBC WITH DIFFERENTIAL/PLATELET
Basophils Absolute: 0.1 10*3/uL (ref 0.0–0.1)
Basophils Relative: 1 % (ref 0–1)
HCT: 41.8 % (ref 39.0–52.0)
Lymphocytes Relative: 27 % (ref 12–46)
MCHC: 34 g/dL (ref 30.0–36.0)
Neutro Abs: 3.7 10*3/uL (ref 1.7–7.7)
Neutrophils Relative %: 58 % (ref 43–77)
Platelets: 63 10*3/uL — ABNORMAL LOW (ref 150–400)
RDW: 14.2 % (ref 11.5–15.5)
WBC: 6.3 10*3/uL (ref 4.0–10.5)

## 2012-08-15 LAB — BASIC METABOLIC PANEL
CO2: 22 mEq/L (ref 19–32)
Chloride: 102 mEq/L (ref 96–112)
GFR calc Af Amer: >90 mL/min (ref >90)
Potassium: 3.1 mEq/L — ABNORMAL LOW (ref 3.5–5.1)
Sodium: 137 mEq/L (ref 135–145)

## 2012-08-15 LAB — MAGNESIUM: Magnesium: 1.7 mg/dL (ref 1.5–2.5)

## 2012-08-15 LAB — PHOSPHORUS: Phosphorus: 3.8 mg/dL (ref 2.3–4.6)

## 2012-08-15 MED ORDER — MAGNESIUM SULFATE 50 % IJ SOLN
3.0000 g | Freq: Once | INTRAMUSCULAR | Status: AC
  Administered 2012-08-15: 3 g via INTRAVENOUS
  Filled 2012-08-15: qty 6

## 2012-08-15 MED ORDER — MAGNESIUM OXIDE 400 (241.3 MG) MG PO TABS
200.0000 mg | ORAL_TABLET | Freq: Two times a day (BID) | ORAL | Status: DC
  Administered 2012-08-15 – 2012-08-17 (×5): 200 mg via ORAL
  Filled 2012-08-15 (×6): qty 0.5

## 2012-08-15 MED ORDER — DEXTROSE 5 % IV SOLN: 3.0000 g | Freq: Once | INTRAVENOUS | Status: DC

## 2012-08-15 MED ORDER — POTASSIUM CHLORIDE CRYS ER 20 MEQ PO TBCR
40.0000 meq | EXTENDED_RELEASE_TABLET | ORAL | Status: AC
  Administered 2012-08-15 (×2): 40 meq via ORAL
  Filled 2012-08-15 (×2): qty 2

## 2012-08-15 MED ORDER — CLONIDINE HCL 0.1 MG PO TABS
0.1000 mg | ORAL_TABLET | Freq: Three times a day (TID) | ORAL | Status: DC
  Administered 2012-08-15 – 2012-08-17 (×6): 0.1 mg via ORAL
  Filled 2012-08-15 (×9): qty 1

## 2012-08-15 NOTE — Progress Notes (Signed)
Subjective: Pt had no acute events overnight. Ambulating well. VSS. Pt becoming more oriented and only required 4mg  ativan prn overnight. Contemplating rehab placement. No complaints.   Objective: Vital signs in last 24 hours: Filed Vitals:   08/14/12 1444 08/14/12 2134 08/14/12 2201 08/15/12 0607  BP: 129/79 130/74  145/84  Pulse: 66 59 63 52  Temp: 98.9 F (37.2 C) 98.1 F (36.7 C)  98 F (36.7 C)  TempSrc: Oral Oral  Oral  Resp: 18 20  20   Height:      Weight:    155 lb 6.8 oz (70.5 kg)  SpO2: 97%   97%   Weight change:   Intake/Output Summary (Last 24 hours) at 08/15/12 1107 Last data filed at 08/15/12 0500  Gross per 24 hour  Intake    240 ml  Output    300 ml  Net    -60 ml   General: resting in bed, NAD HEENT: PERRL, EOMI, no scleral icterus Cardiac: RRR, no rubs, murmurs or gallops Pulm: clear to auscultation bilaterally, moving normal volumes of air Abd: soft, nontender, nondistended, BS present Ext: warm and well perfused, no pedal edema Neuro: alert and oriented X3, cranial nerves II-XII grossly intact, minimal asterxisis, no tremors   Lab Results: Basic Metabolic Panel:  Recent Labs Lab 08/14/12 0515 08/15/12 0600  NA 142 137  K 3.7 3.1*  CL 109 102  CO2 22 22  GLUCOSE 109* 97  BUN 13 10  CREATININE 0.76 0.70  CALCIUM 8.8 8.8  MG 1.9 1.7  PHOS 4.0 3.8   Liver Function Tests:  Recent Labs Lab 08/12/12 0446 08/13/12 0530  AST 52* 65*  ALT 35 49  ALKPHOS 125* 133*  BILITOT 1.2 1.4*  PROT 6.4 6.5  ALBUMIN 2.4* 2.6*    Recent Labs Lab 08/09/12 0400  LIPASE 24  AMYLASE 53    Recent Labs Lab 08/10/12 0445 08/13/12 0530  AMMONIA 63* 45   CBC:  Recent Labs Lab 08/14/12 0515 08/15/12 0600  WBC 7.2 6.3  NEUTROABS 4.4 3.7  HGB 14.0 14.2  HCT 41.5 41.8  MCV 94.7 92.7  PLT 63* 63*   CBG:  Recent Labs Lab 08/11/12 1212 08/11/12 1945  GLUCAP 169* 158*   Thyroid Function Tests: No results found for this basename: TSH,  T4TOTAL, FREET4, T3FREE, THYROIDAB,  in the last 168 hours Coagulation:  Recent Labs Lab 08/09/12 0400 08/10/12 0445  LABPROT 17.0* 16.6*  INR 1.42 1.38   Micro Results: Recent Results (from the past 240 hour(s))  URINE CULTURE     Status: None   Collection Time    08/06/12  9:45 AM      Result Value Range Status   Specimen Description URINE, RANDOM   Final   Special Requests NONE   Final   Culture  Setup Time 08/06/2012 15:03   Final   Colony Count NO GROWTH   Final   Culture NO GROWTH   Final   Report Status 08/07/2012 FINAL   Final  MRSA PCR SCREENING     Status: None   Collection Time    08/08/12 10:24 AM      Result Value Range Status   MRSA by PCR NEGATIVE  NEGATIVE Final   Comment:            The GeneXpert MRSA Assay (FDA     approved for NASAL specimens     only), is one component of a     comprehensive MRSA colonization  surveillance program. It is not     intended to diagnose MRSA     infection nor to guide or     monitor treatment for     MRSA infections.  URINE CULTURE     Status: None   Collection Time    08/10/12 11:40 AM      Result Value Range Status   Specimen Description URINE, CATHETERIZED   Final   Special Requests NONE   Final   Culture  Setup Time 08/10/2012 18:32   Final   Colony Count NO GROWTH   Final   Culture NO GROWTH   Final   Report Status 08/11/2012 FINAL   Final  CULTURE, BLOOD (ROUTINE X 2)     Status: None   Collection Time    08/10/12 11:45 AM      Result Value Range Status   Specimen Description BLOOD RIGHT ARM   Final   Special Requests BOTTLES DRAWN AEROBIC ONLY 5CC   Final   Culture  Setup Time 08/10/2012 17:31   Final   Culture     Final   Value:        BLOOD CULTURE RECEIVED NO GROWTH TO DATE CULTURE WILL BE HELD FOR 5 DAYS BEFORE ISSUING A FINAL NEGATIVE REPORT   Report Status PENDING   Incomplete  CULTURE, BLOOD (ROUTINE X 2)     Status: None   Collection Time    08/10/12 12:00 PM      Result Value Range Status    Specimen Description BLOOD RIGHT ARM   Final   Special Requests BOTTLES DRAWN AEROBIC ONLY 5CC   Final   Culture  Setup Time 08/10/2012 17:30   Final   Culture     Final   Value:        BLOOD CULTURE RECEIVED NO GROWTH TO DATE CULTURE WILL BE HELD FOR 5 DAYS BEFORE ISSUING A FINAL NEGATIVE REPORT   Report Status PENDING   Incomplete   Studies/Results: No results found. Medications: I have reviewed the patient's current medications. Scheduled Meds: . antiseptic oral rinse  15 mL Mouth Rinse q12n4p  . cloNIDine  0.1 mg Oral Q8H  . fluconazole  100 mg Oral Daily  . folic acid  1 mg Oral Daily  . hydrochlorothiazide  12.5 mg Oral Daily  . lactulose  30 g Oral TID  . levofloxacin  500 mg Oral Daily  . magnesium oxide  200 mg Oral BID  . magnesium sulfate 1 - 4 g bolus IVPB  3 g Intravenous Once  . multivitamin with minerals  1 tablet Oral Daily  . pantoprazole  40 mg Oral Q1200  . potassium chloride  40 mEq Oral Q4H  . thiamine  100 mg Oral Daily   Or  . thiamine  100 mg Intravenous Daily   Continuous Infusions:   PRN Meds:.artificial tears, hydrALAZINE, HYDROcodone-acetaminophen, LORazepam, LORazepam, LORazepam, menthol-cetylpyridinium, ondansetron Assessment/Plan: # EtOH withdrawal vs Acute encephalopathy: Pt known heavy EtOH use and been in and out of inpt and outpt rehab/detox centers without success. Ammonia level increased at 63 during ICU stay. Required precedex, clonidine, ativan, and lactulose for control of DTs. Extensive education in terms of alcohol cessation and sequale already present as related to continued EtOH use had with patient and pt father this AM. Pt only received/needed 4mg  of ativan overnight -ativan prn -clonidine -lactulose   -protonix -folate, thiamine -CSW to readdress rehab with pt  #Hypokalemic and hypomagnesemic: K 3.1 and Mg 1.7 today.  -  KDUR 40 mEq -Mg 2mg  IV -recheck in AM  # SIRS-resolved: Pt had fever while in ICU some ? Parotiditis and  all other extensive infectious workup was negative. Pt received 3 doses of IV Vanc/zosyn and then transitioned to levofloxacin.  -cont levofloxacin  # Acute uncomplicated pancreatitis-resolved: on admission pt had elevated lipase in 140s that has now normalized and CT abd findings consistent with uncomplicated pancreatitis. Pt was given TF during ICU stay but has now transitioned to oral diet and tolerating well.  -cont advanced diet -zofran prn nausea -PT/OT eval  # HTN: ACS workup negative had no evidence of shock while in ICU. SBP 150s this AM.  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  -SCDs  Dispo: Disposition is deferred at this time, awaiting improvement of current medical problems.  Anticipated discharge in approximately 1-2 day(s).   The patient does not have a current PCP (No primary provider on file.), therefore will not be requiring OPC follow-up after discharge.   The patient does not have transportation limitations that hinder transportation to clinic appointments.  .Services Needed at time of discharge: Y = Yes, Blank = No PT:   OT:   RN:   Equipment:   Other:     LOS: 9 days   Christen Bame 08/15/2012, 11:07 AM Pgr: [phone]

## 2012-08-16 LAB — CBC WITH DIFFERENTIAL/PLATELET
Basophils Absolute: 0.1 10*3/uL (ref 0.0–0.1)
Basophils Relative: 1 % (ref 0–1)
Eosinophils Absolute: 0.1 10*3/uL (ref 0.0–0.7)
Eosinophils Relative: 2 % (ref 0–5)
HCT: 41.4 % (ref 39.0–52.0)
MCHC: 33.6 g/dL (ref 30.0–36.0)
MCV: 93.5 fL (ref 78.0–100.0)
Monocytes Absolute: 0.7 10*3/uL (ref 0.1–1.0)
Platelets: 59 10*3/uL — ABNORMAL LOW (ref 150–400)
RDW: 14.1 % (ref 11.5–15.5)

## 2012-08-16 LAB — CULTURE, BLOOD (ROUTINE X 2): Culture: NO GROWTH

## 2012-08-16 LAB — BASIC METABOLIC PANEL
Calcium: 9 mg/dL (ref 8.4–10.5)
Creatinine, Ser: 0.77 mg/dL (ref 0.50–1.35)
GFR calc Af Amer: >90 mL/min (ref >90)
GFR calc non Af Amer: >90 mL/min (ref >90)
Sodium: 138 mEq/L (ref 135–145)

## 2012-08-16 MED ORDER — LORAZEPAM 1 MG PO TABS
1.0000 mg | ORAL_TABLET | Freq: Four times a day (QID) | ORAL | Status: DC | PRN
  Administered 2012-08-16: 1 mg via ORAL
  Filled 2012-08-16: qty 1

## 2012-08-16 MED ORDER — LORAZEPAM 2 MG/ML IJ SOLN: 1.0000 mg | Freq: Four times a day (QID) | INTRAMUSCULAR | Status: DC | PRN

## 2012-08-16 NOTE — Care Management Note (Signed)
Pt and mother given list of local clinics accepting patients, information on [facility] and [facility].  Discussed options with both pt and mother and list of clinics and phone numbers given to pt mother. Johny Shock RN MPH Case Manager [phone]

## 2012-08-16 NOTE — Progress Notes (Signed)
Subjective: Pt doing well this AM. Only required 2 mg Ativan over 24 hrs and CIWA score of 1. Awaiting SW consult in terms of placement goals.   Objective: Vital signs in last 24 hours: Filed Vitals:   08/15/12 0607 08/15/12 1512 08/15/12 2106 08/16/12 0440  BP: 145/84 128/80 129/71 113/73  Pulse: 52 61 65 61  Temp: 98 F (36.7 C)  98.4 F (36.9 C) 98.4 F (36.9 C)  TempSrc: Oral  Oral Oral  Resp: 20  20 20   Height:      Weight: 155 lb 6.8 oz (70.5 kg)   157 lb 10.1 oz (71.5 kg)  SpO2: 97% 99% 99% 96%   Weight change: 2 lb 3.3 oz (1 kg)  Intake/Output Summary (Last 24 hours) at 08/16/12 0741 Last data filed at 08/16/12 0600  Gross per 24 hour  Intake    480 ml  Output      0 ml  Net    480 ml   General: resting in bed, NAD HEENT: PERRL, EOMI, no scleral icterus Cardiac: RRR, no rubs, murmurs or gallops Pulm: clear to auscultation bilaterally, moving normal volumes of air Abd: soft, nontender, nondistended, BS present Ext: warm and well perfused, no pedal edema Neuro: alert and oriented X3, cranial nerves II-XII grossly intact, minimal asterxisis, no tremors   Lab Results: Basic Metabolic Panel:  Recent Labs Lab 08/14/12 0515 08/15/12 0600 08/16/12 0535  NA 142 137 138  K 3.7 3.1* 3.6  CL 109 102 101  CO2 22 22 31   GLUCOSE 109* 97 100*  BUN 13 10 8   CREATININE 0.76 0.70 0.77  CALCIUM 8.8 8.8 9.0  MG 1.9 1.7 2.1  PHOS 4.0 3.8  --    Liver Function Tests:  Recent Labs Lab 08/12/12 0446 08/13/12 0530  AST 52* 65*  ALT 35 49  ALKPHOS 125* 133*  BILITOT 1.2 1.4*  PROT 6.4 6.5  ALBUMIN 2.4* 2.6*   No results found for this basename: LIPASE, AMYLASE,  in the last 168 hours  Recent Labs Lab 08/10/12 0445 08/13/12 0530  AMMONIA 63* 45   CBC:  Recent Labs Lab 08/15/12 0600 08/16/12 0535  WBC 6.3 5.0  NEUTROABS 3.7 3.0  HGB 14.2 13.9  HCT 41.8 41.4  MCV 92.7 93.5  PLT 63* 59*   CBG:  Recent Labs Lab 08/11/12 1212 08/11/12 1945  GLUCAP  169* 158*   Coagulation:  Recent Labs Lab 08/10/12 0445  LABPROT 16.6*  INR 1.38   Micro Results: Recent Results (from the past 240 hour(s))  URINE CULTURE     Status: None   Collection Time    08/06/12  9:45 AM      Result Value Range Status   Specimen Description URINE, RANDOM   Final   Special Requests NONE   Final   Culture  Setup Time 08/06/2012 15:03   Final   Colony Count NO GROWTH   Final   Culture NO GROWTH   Final   Report Status 08/07/2012 FINAL   Final  MRSA PCR SCREENING     Status: None   Collection Time    08/08/12 10:24 AM      Result Value Range Status   MRSA by PCR NEGATIVE  NEGATIVE Final   Comment:            The GeneXpert MRSA Assay (FDA     approved for NASAL specimens     only), is one component of a     comprehensive  MRSA colonization     surveillance program. It is not     intended to diagnose MRSA     infection nor to guide or     monitor treatment for     MRSA infections.  URINE CULTURE     Status: None   Collection Time    08/10/12 11:40 AM      Result Value Range Status   Specimen Description URINE, CATHETERIZED   Final   Special Requests NONE   Final   Culture  Setup Time 08/10/2012 18:32   Final   Colony Count NO GROWTH   Final   Culture NO GROWTH   Final   Report Status 08/11/2012 FINAL   Final  CULTURE, BLOOD (ROUTINE X 2)     Status: None   Collection Time    08/10/12 11:45 AM      Result Value Range Status   Specimen Description BLOOD RIGHT ARM   Final   Special Requests BOTTLES DRAWN AEROBIC ONLY 5CC   Final   Culture  Setup Time 08/10/2012 17:31   Final   Culture     Final   Value:        BLOOD CULTURE RECEIVED NO GROWTH TO DATE CULTURE WILL BE HELD FOR 5 DAYS BEFORE ISSUING A FINAL NEGATIVE REPORT   Report Status PENDING   Incomplete  CULTURE, BLOOD (ROUTINE X 2)     Status: None   Collection Time    08/10/12 12:00 PM      Result Value Range Status   Specimen Description BLOOD RIGHT ARM   Final   Special Requests  BOTTLES DRAWN AEROBIC ONLY 5CC   Final   Culture  Setup Time 08/10/2012 17:30   Final   Culture     Final   Value:        BLOOD CULTURE RECEIVED NO GROWTH TO DATE CULTURE WILL BE HELD FOR 5 DAYS BEFORE ISSUING A FINAL NEGATIVE REPORT   Report Status PENDING   Incomplete   Studies/Results: No results found. Medications: I have reviewed the patient's current medications. Scheduled Meds: . antiseptic oral rinse  15 mL Mouth Rinse q12n4p  . cloNIDine  0.1 mg Oral Q8H  . fluconazole  100 mg Oral Daily  . folic acid  1 mg Oral Daily  . hydrochlorothiazide  12.5 mg Oral Daily  . lactulose  30 g Oral TID  . levofloxacin  500 mg Oral Daily  . magnesium oxide  200 mg Oral BID  . multivitamin with minerals  1 tablet Oral Daily  . pantoprazole  40 mg Oral Q1200  . thiamine  100 mg Oral Daily   Or  . thiamine  100 mg Intravenous Daily   Continuous Infusions:   PRN Meds:.artificial tears, hydrALAZINE, HYDROcodone-acetaminophen, LORazepam, LORazepam, LORazepam, menthol-cetylpyridinium, ondansetron Assessment/Plan: # EtOH withdrawal vs Acute encephalopathy: Pt known heavy EtOH use and been in and out of inpt and outpt rehab/detox centers without success. Ammonia level increased at 63 during ICU stay. Required precedex, clonidine, ativan, and lactulose for control of DTs. Extensive education in terms of alcohol cessation and sequale already present as related to continued EtOH use had with patient and pt father this AM. Pt only received/needed 2mg  of ativan overnight. CIWA score of 1.  -ativan prn -clonidine -lactulose   -protonix -folate, thiamine -CSW to follow up with placement of pt in terms of pursing rehab  #Hypokalemic and hypomagnesemic-resolved: K 3.6 and Mg 2.1 today.  -cont to monitor  # SIRS-resolved:  Pt had fever while in ICU some ? Parotiditis and all other extensive infectious workup was negative. Pt received 3 doses of IV Vanc/zosyn and then transitioned to levofloxacin.  -cont  levofloxacin  # Acute uncomplicated pancreatitis-resolved: on admission pt had elevated lipase in 140s that has now normalized and CT abd findings consistent with uncomplicated pancreatitis. Pt was given TF during ICU stay but has now transitioned to oral diet and tolerating well. Pt able to ambulate and PT/OT state no further follow up needed.  -cont advanced diet -zofran prn nausea  # HTN: ACS workup negative had no evidence of shock while in ICU. SBP 150s this AM.  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 No active bleeding -SCDs  Dispo: Disposition is deferred at this time, awaiting improvement of current medical problems.  Anticipated discharge in approximately 1-2 day(s).   The patient does not have a current PCP (No primary provider on file.), therefore will not be requiring OPC follow-up after discharge.   The patient does not have transportation limitations that hinder transportation to clinic appointments.  .Services Needed at time of discharge: Y = Yes, Blank = No PT:   OT:   RN:   Equipment:   Other:     LOS: 10 days   Christen Bame 08/16/2012, 7:41 AM Pgr: [phone]

## 2012-08-16 NOTE — Care Management Note (Signed)
   CARE MANAGEMENT NOTE 08/16/2012  Patient:  [name], [name]   Account Number:  [id number]  Date Initiated:  08/09/2012  Documentation initiated by:  [facility]  Subjective/Objective Assessment:   Admitted with abd pain - pancreatis     Action/Plan:   08/16/12 Met with pt and mother re PCP options for this self pay patient. List of local clinics and info on [facility] as well as  [facility] info provided.   Anticipated DC Date:  08/18/2012   Anticipated DC Plan:  HOME W HOME HEALTH SERVICES      DC Planning Services  CM consult      Choice offered to / List presented to:             Status of service:  In process, will continue to follow Medicare Important Message given?   (If response is "NO", the following Medicare IM given date fields will be blank) Date Medicare IM given:   Date Additional Medicare IM given:    Discharge Disposition:    Per UR Regulation:  Reviewed for med. necessity/level of care/duration of stay  If discussed at Long Length of Stay Meetings, dates discussed:   08/12/2012    Comments:  Contact:  Sharren Bridge [phone]

## 2012-08-16 NOTE — Clinical Social Work Psychosocial (Addendum)
Clinical Social Work Department BRIEF PSYCHOSOCIAL ASSESSMENT 08/16/2012  Patient:  [name], [name]     Account Number:  [id number]     Admit date:  08/06/2012  Clinical Social Worker:  Delmer Islam  Date/Time:  08/16/2012 05:52 AM  Referred by:  Physician  Date Referred:  08/16/2012 Referred for  Substance Abuse   Other Referral:   Interview type:  Patient Other interview type:    PSYCHOSOCIAL DATA Living Status:  ALONE Admitted from facility:   Level of care:   Primary support name:  Jodie Echevaria Primary support relationship to patient:  PARENT Degree of support available:   Mother and a friend at the bedside during visit and patient allowed CSW to talk with him with persons present.    CURRENT CONCERNS Current Concerns  Substance Abuse   Other Concerns:   Patient having a PCP    SOCIAL WORK ASSESSMENT / PLAN CSW talked with patient regarding his ETOH use and went over substance abuse resources with him - inpatient and outpatient. Patient stated that he does not want inpatient substance abuse treatment. Patient reported that he has been to rehab in [place] several years ago, to [facility] for detox and also to [facility] for days. Patient did not have anything positive to say about any of the treatment  programs he has been to previously. Patient did not commit to contacting any of the facilities, however CSW encouraged him to do so and to talk with them about insurance/payment options.    CSW and patient also talked about applying for Medicaid. When asked, patient knows where the Department of Social Services is located.   Assessment/plan status:  No Further Intervention Required Other assessment/ plan:   Information/referral to community resources:   Patient given sibstance abuse information on [facility] residential treatment facility in [place], [place] in [place] and [address] in [place].    Nurse Case Manager Johny Shock was contacted  and provided patient with information on medical clinics.    PATIENT'S/FAMILY'S RESPONSE TO PLAN OF CARE: Patient talked with CSW but was non-committal regarding contacting any of the resources provided. Mother listened but did interject that patient needs a PCP before leaving the hospital. CSW signing off as patient given substance abuse resource information and contact information.

## 2012-08-17 LAB — CBC WITH DIFFERENTIAL/PLATELET
Basophils Absolute: 0 10*3/uL (ref 0.0–0.1)
Basophils Relative: 1 % (ref 0–1)
HCT: 42.2 % (ref 39.0–52.0)
MCHC: 33.6 g/dL (ref 30.0–36.0)
Monocytes Absolute: 0.6 10*3/uL (ref 0.1–1.0)
Neutro Abs: 3.3 10*3/uL (ref 1.7–7.7)
Platelets: 63 10*3/uL — ABNORMAL LOW (ref 150–400)
RDW: 14 % (ref 11.5–15.5)

## 2012-08-17 LAB — BASIC METABOLIC PANEL
Calcium: 9.1 mg/dL (ref 8.4–10.5)
Chloride: 100 mEq/L (ref 96–112)
Creatinine, Ser: 0.75 mg/dL (ref 0.50–1.35)
GFR calc Af Amer: >90 mL/min (ref >90)
Sodium: 137 mEq/L (ref 135–145)

## 2012-08-17 LAB — MAGNESIUM: Magnesium: 1.9 mg/dL (ref 1.5–2.5)

## 2012-08-17 MED ORDER — PANTOPRAZOLE SODIUM 40 MG PO TBEC: 40.0000 mg | DELAYED_RELEASE_TABLET | Freq: Every day | ORAL | Status: DC

## 2012-08-17 MED ORDER — CLONIDINE HCL 0.1 MG PO TABS: 0.1000 mg | ORAL_TABLET | Freq: Three times a day (TID) | ORAL | Status: DC

## 2012-08-17 MED ORDER — LACTULOSE 10 GM/15ML PO SOLN: 30.0000 g | Freq: Three times a day (TID) | ORAL | Status: DC

## 2012-08-17 MED ORDER — HYDROCHLOROTHIAZIDE 12.5 MG PO CAPS: 12.5000 mg | ORAL_CAPSULE | Freq: Every day | ORAL | Status: DC

## 2012-08-17 MED ORDER — FLUCONAZOLE 100 MG PO TABS: 100.0000 mg | ORAL_TABLET | Freq: Every day | ORAL | Status: DC

## 2012-08-17 NOTE — Progress Notes (Signed)
Physical Therapy Treatment Patient Details Name: [name] MRN: [id number] DOB: 03/13/1971 Today's Date: 08/17/2012 Time: 4098-1191 PT Time Calculation (min): 8 min  PT Assessment / Plan / Recommendation Comments on Treatment Session  Pt now independent with all mobility. No further PT needed.    Follow Up Recommendations  No PT follow up     Does the patient have the potential to tolerate intense rehabilitation     Barriers to Discharge        Equipment Recommendations  None recommended by PT    Recommendations for Other Services    Frequency     Plan All goals met and education completed, patient dischaged from PT services    Precautions / Restrictions Precautions Precautions: None   Pertinent Vitals/Pain N/A    Mobility  Bed Mobility Supine to Sit: 7: Independent Sitting - Scoot to Edge of Bed: 7: Independent Transfers Sit to Stand: 7: Independent Stand to Sit: 7: Independent Ambulation/Gait Ambulation/Gait Assistance: 7: Independent Ambulation Distance (Feet): 300 Feet Assistive device: None Gait Pattern: Within Functional Limits Stairs: Yes Stairs Assistance: 6: Modified independent (Device/Increase time) Stair Management Technique: One rail Right;Forwards Number of Stairs: 12    Exercises     PT Diagnosis:    PT Problem List:   PT Treatment Interventions:     PT Goals Acute Rehab PT Goals PT Goal: Supine/Side to Sit - Progress: Met PT Goal: Sit to Supine/Side - Progress: Met PT Goal: Sit to Stand - Progress: Met PT Goal: Stand to Sit - Progress: Met PT Transfer Goal: Bed to Chair/Chair to Bed - Progress: Met PT Goal: Ambulate - Progress: Met PT Goal: Up/Down Stairs - Progress: Met Additional Goals PT Goal: Additional Goal #1 - Progress: Discontinued (comment) (pt I with all mobility without any loss of balance.)  Visit Information  Last PT Received On: 08/17/12 Assistance Needed: +1    Subjective Data  Subjective: Pt reports he is  walking fine.   Cognition  Cognition Arousal/Alertness: Awake/alert Behavior During Therapy: WFL for tasks assessed/performed Overall Cognitive Status: Within Functional Limits for tasks assessed    Balance  Static Standing Balance Static Standing - Balance Support: No upper extremity supported Static Standing - Level of Assistance: 7: Independent  End of Session PT - End of Session Activity Tolerance: Patient tolerated treatment well Patient left: Other (comment) (standing in room with MD) Nurse Communication: Mobility status   GP     [facility] 08/17/2012, 10:04 AM  Skip Mayer PT [phone]

## 2012-08-17 NOTE — Care Management Note (Signed)
    Page 1 of 1   08/17/2012     11:40:24 AM   CARE MANAGEMENT NOTE 08/17/2012  Patient:  [name], [name]   Account Number:  [id number]  Date Initiated:  08/09/2012  Documentation initiated by:  [facility]  Subjective/Objective Assessment:   Admitted with abd pain - pancreatis     Action/Plan:   08/16/12 Met with pt and mother re PCP options for this self pay patient. List of local clinics and info on [facility] as well as  [facility] info provided.  pt  eval- no needs.   Anticipated DC Date:  08/17/2012   Anticipated DC Plan:  HOME/SELF CARE      DC Planning Services  CM consult  Follow-up appt scheduled  [facility] Program      Choice offered to / List presented to:             Status of service:  Completed, signed off Medicare Important Message given?   (If response is "NO", the following Medicare IM given date fields will be blank) Date Medicare IM given:   Date Additional Medicare IM given:    Discharge Disposition:  HOME/SELF CARE  Per UR Regulation:  Reviewed for med. necessity/level of care/duration of stay  If discussed at Long Length of Stay Meetings, dates discussed:   08/12/2012    Comments:  Contact:  Sharren Bridge [phone]  08/17/12 11:38 Letha Cape RN, BSN [phone] patient is for dc today, he has transportation, but needs ast with medications, ast patient  thru Match Program , letter given to patient, he will be using [facility] on [address]  Per phsycal therapy , patient has not pt needs.

## 2012-08-17 NOTE — Progress Notes (Signed)
Subjective: Pt doing well this AM. Only required 1mg  ativan overnight. Was ambulating in hall and stairs with PT this AM. Pt ready to go home and was given information regarding rehab/detox centers both inpt and outpt resources. Discussion regarding PCP was also had and pt didn't want to establish in a resident run clinic facility or a place that requires scheduled visits.   Objective: Vital signs in last 24 hours: Filed Vitals:   08/16/12 1345 08/16/12 2028 08/17/12 0526 08/17/12 0956  BP: 111/66 124/74 137/73 132/84  Pulse: 71 62 55   Temp: 98.3 F (36.8 C) 98.9 F (37.2 C) 98.5 F (36.9 C)   TempSrc: Oral Oral Oral   Resp: 18 20 18    Height:      Weight:   156 lb 15.5 oz (71.2 kg)   SpO2: 96% 97% 98%    Weight change: -10.6 oz (-0.3 kg)  Intake/Output Summary (Last 24 hours) at 08/17/12 1051 Last data filed at 08/17/12 0600  Gross per 24 hour  Intake    880 ml  Output      2 ml  Net    878 ml   General: ambulating in hall, NAD HEENT: PERRL, EOMI, no scleral icterus Cardiac: RRR, no rubs, murmurs or gallops Pulm: clear to auscultation bilaterally, moving normal volumes of air Abd: soft, nontender, nondistended, BS present Ext: warm and well perfused, no pedal edema Neuro: alert and oriented X3, cranial nerves II-XII grossly intact, minimal asterxisis, no tremors, normal gait  Lab Results: Basic Metabolic Panel:  Recent Labs Lab 08/14/12 0515 08/15/12 0600 08/16/12 0535 08/17/12 0420  NA 142 137 138 137  K 3.7 3.1* 3.6 3.9  CL 109 102 101 100  CO2 22 22 31 29   GLUCOSE 109* 97 100* 88  BUN 13 10 8 7   CREATININE 0.76 0.70 0.77 0.75  CALCIUM 8.8 8.8 9.0 9.1  MG 1.9 1.7 2.1 1.9  PHOS 4.0 3.8  --   --    Liver Function Tests:  Recent Labs Lab 08/12/12 0446 08/13/12 0530  AST 52* 65*  ALT 35 49  ALKPHOS 125* 133*  BILITOT 1.2 1.4*  PROT 6.4 6.5  ALBUMIN 2.4* 2.6*    Recent Labs Lab 08/13/12 0530  AMMONIA 45   CBC:  Recent Labs Lab  08/16/12 0535 08/17/12 0420  WBC 5.0 5.3  NEUTROABS 3.0 3.3  HGB 13.9 14.2  HCT 41.4 42.2  MCV 93.5 93.0  PLT 59* 63*   CBG:  Recent Labs Lab 08/11/12 1212 08/11/12 1945  GLUCAP 169* 158*   Micro Results: Recent Results (from the past 240 hour(s))  MRSA PCR SCREENING     Status: None   Collection Time    08/08/12 10:24 AM      Result Value Range Status   MRSA by PCR NEGATIVE  NEGATIVE Final   Comment:            The GeneXpert MRSA Assay (FDA     approved for NASAL specimens     only), is one component of a     comprehensive MRSA colonization     surveillance program. It is not     intended to diagnose MRSA     infection nor to guide or     monitor treatment for     MRSA infections.  URINE CULTURE     Status: None   Collection Time    08/10/12 11:40 AM      Result Value Range Status  Specimen Description URINE, CATHETERIZED   Final   Special Requests NONE   Final   Culture  Setup Time 08/10/2012 18:32   Final   Colony Count NO GROWTH   Final   Culture NO GROWTH   Final   Report Status 08/11/2012 FINAL   Final  CULTURE, BLOOD (ROUTINE X 2)     Status: None   Collection Time    08/10/12 11:45 AM      Result Value Range Status   Specimen Description BLOOD RIGHT ARM   Final   Special Requests BOTTLES DRAWN AEROBIC ONLY 5CC   Final   Culture  Setup Time 08/10/2012 17:31   Final   Culture NO GROWTH 5 DAYS   Final   Report Status 08/16/2012 FINAL   Final  CULTURE, BLOOD (ROUTINE X 2)     Status: None   Collection Time    08/10/12 12:00 PM      Result Value Range Status   Specimen Description BLOOD RIGHT ARM   Final   Special Requests BOTTLES DRAWN AEROBIC ONLY 5CC   Final   Culture  Setup Time 08/10/2012 17:30   Final   Culture NO GROWTH 5 DAYS   Final   Report Status 08/16/2012 FINAL   Final   Studies/Results: No results found. Medications: I have reviewed the patient's current medications. Scheduled Meds: . antiseptic oral rinse  15 mL Mouth Rinse q12n4p   . cloNIDine  0.1 mg Oral Q8H  . fluconazole  100 mg Oral Daily  . folic acid  1 mg Oral Daily  . hydrochlorothiazide  12.5 mg Oral Daily  . lactulose  30 g Oral TID  . levofloxacin  500 mg Oral Daily  . magnesium oxide  200 mg Oral BID  . multivitamin with minerals  1 tablet Oral Daily  . pantoprazole  40 mg Oral Q1200  . thiamine  100 mg Oral Daily   Continuous Infusions:   PRN Meds:.artificial tears, hydrALAZINE, HYDROcodone-acetaminophen, LORazepam, LORazepam, menthol-cetylpyridinium, ondansetron Assessment/Plan: # EtOH withdrawal vs Acute encephalopathy: Pt known heavy EtOH use and been in and out of inpt and outpt rehab/detox centers without success. Ammonia level increased at 63 during ICU stay. Required precedex, clonidine, ativan, and lactulose for control of DTs. Extensive education in terms of alcohol cessation and sequale already present as related to continued EtOH use had with patient and pt father this AM. Pt only received/needed 1mg  of ativan overnight. CIWA score of 1.  -ativan prn -clonidine -lactulose   -protonix -folate, thiamine -CSW gave pt resources/pt is noncommittal at this time  # SIRS-resolved: Pt had fever while in ICU some ? Parotiditis and all other extensive infectious workup was negative. Pt received 3 doses of IV Vanc/zosyn and then transitioned to levofloxacin. Pt had some oral thrush as well in ICU and started on Nystatin.  -pt completed 4/7 day course of nystatin -pt has completed a 10 day course of ABx including levofloxacin   # Acute uncomplicated pancreatitis-resolved: on admission pt had elevated lipase in 140s that has now normalized and CT abd findings consistent with uncomplicated pancreatitis. Pt was given TF during ICU stay but has now transitioned to oral diet and tolerating well. Pt able to ambulate and PT/OT state no further follow up needed.  -tolerated an advanced diet -zofran prn nausea  # HTN: ACS workup negative had no evidence of  shock while in ICU. SBP 150s this AM.  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 No active bleeding -SCDs  Dispo: Disposition is deferred at this time, awaiting improvement of current medical problems.  Anticipated discharge in approximately 1-2 day(s).   The patient does not have a current PCP (No primary provider on file.), therefore will not be requiring OPC follow-up after discharge.   The patient does not have transportation limitations that hinder transportation to clinic appointments.  .Services Needed at time of discharge: Y = Yes, Blank = No PT:   OT:   RN:   Equipment:   Other:     LOS: 11 days   Christen Bame 08/17/2012, 10:51 AM Pgr: [phone]

## 2012-08-17 NOTE — Progress Notes (Signed)
Physical Therapy Discharge Patient Details Name: [name] MRN: [id number] DOB: 30-Jan-1971 Today's Date: 08/17/2012 Time: 4098-1191 PT Time Calculation (min): 8 min  Patient discharged from PT services secondary to goals met and no further PT needs identified.  Please see latest therapy progress note for current level of functioning and progress toward goals.    Progress and discharge plan discussed with patient and/or caregiver: Patient/Caregiver agrees with plan  GP     [facility] 08/17/2012, 10:07 AM

## 2012-08-17 NOTE — Progress Notes (Signed)
[name] discharged Home per MD order.  Discharge instructions reviewed and discussed with the patient, all questions and concerns answered. Copy of instructions and scripts given to patient.  Explained to pt. How to sign up for Mychart.    Medication List    TAKE these medications       ALPRAZolam 0.5 MG tablet  Commonly known as:  XANAX  Take 0.25 mg by mouth at bedtime as needed for sleep or anxiety.     cloNIDine 0.1 MG tablet  Commonly known as:  CATAPRES  Take 1 tablet (0.1 mg total) by mouth every 8 (eight) hours.     fluconazole 100 MG tablet  Commonly known as:  DIFLUCAN  Take 1 tablet (100 mg total) by mouth daily.     hydrochlorothiazide 12.5 MG capsule  Commonly known as:  MICROZIDE  Take 1 capsule (12.5 mg total) by mouth daily.     lactulose 10 GM/15ML solution  Commonly known as:  CHRONULAC  Take 45 mLs (30 g total) by mouth 3 (three) times daily.     pantoprazole 40 MG tablet  Commonly known as:  PROTONIX  Take 1 tablet (40 mg total) by mouth daily at 12 noon.        Patients skin is clean, dry and intact, no evidence of skin break down. IV site discontinued and catheter remains intact. Site without signs and symptoms of complications. Dressing and pressure applied.  Patient escorted to car by volunteer in a wheelchair,  no distress noted upon discharge.  Laural Benes, Arthelia Callicott C 08/17/2012 11:59 AM

## 2012-08-17 NOTE — Discharge Summary (Signed)
Internal Medicine Teaching [facility] Discharge Note  Name: [name] MRN: [id number] DOB: [birth year] 42 y.o.  Date of Admission: 08/06/2012  8:35 AM Date of Discharge: 08/17/2012 Attending Physician: Burns Spain, MD  Discharge Diagnosis: Principal Problem:   Acute pancreatitis Active Problems:   Alcohol abuse   Elevated blood alcohol level   Cirrhosis of liver   Thrombocytopenia   Hypertension   Alcohol withdrawal with delirium   Portal hypertension   Splenomegaly   Alcoholic hepatitis without ascites   Hiatal hernia   Encephalopathy acute   Hypokalemia   Hypomagnesemia   Other and unspecified hyperlipidemia   Discharge Medications:   Medication List    TAKE these medications       ALPRAZolam 0.5 MG tablet  Commonly known as:  XANAX  Take 0.25 mg by mouth at bedtime as needed for sleep or anxiety.     cloNIDine 0.1 MG tablet  Commonly known as:  CATAPRES  Take 1 tablet (0.1 mg total) by mouth every 8 (eight) hours.     fluconazole 100 MG tablet  Commonly known as:  DIFLUCAN  Take 1 tablet (100 mg total) by mouth daily.     hydrochlorothiazide 12.5 MG capsule  Commonly known as:  MICROZIDE  Take 1 capsule (12.5 mg total) by mouth daily.     lactulose 10 GM/15ML solution  Commonly known as:  CHRONULAC  Take 45 mLs (30 g total) by mouth 3 (three) times daily.     pantoprazole 40 MG tablet  Commonly known as:  PROTONIX  Take 1 tablet (40 mg total) by mouth daily at 12 noon.        Disposition and follow-up:   Mr.Malin B Delgrande was discharged from [facility] in Stable condition.  At the hospital follow up visit please address: 1. CBC for platelets 2. EtOH rehab 3. Resolution of oral thrush  Follow-up Appointments:     Follow-up Information   Follow up with [facility] On 08/24/2012. (10:45, please bring $20 co pay and photo id and your dc instructions )    Contact information:   [address] [place] [address] [phone]       Future Appointments Provider Department Dept Phone   08/24/2012 10:45 AM [facility] Covering Provider [facility] Joan Flores [phone]      Consultations:   Critical Care  Procedures Performed:  Dg Chest 2 View  08/06/2012   *RADIOLOGY REPORT*  Clinical Data: Abdominal pain, hypertension  CHEST - 2 VIEW  Comparison: 12/21/2008  Findings: Stable heart size and vascularity.  No focal pneumonia, collapse, consolidation, edema pattern, effusion or pneumothorax. Trachea midline.  Hiatal hernia noted with an air-fluid level.  IMPRESSION: Hiatal hernia.  No acute chest process.   Original Report Authenticated By: Judie Petit. Miles Costain, M.D.   Ct Abdomen Pelvis W Contrast  08/06/2012   *RADIOLOGY REPORT*  Clinical Data: Crampy abdominal pain across the lower abdomen, nausea and vomiting  CT ABDOMEN AND PELVIS WITH CONTRAST  Technique:  Multidetector CT imaging of the abdomen and pelvis was performed following the standard protocol during bolus administration of intravenous contrast.  Contrast: 80mL OMNIPAQUE IOHEXOL 300 MG/ML  SOLN  Comparison: None.  Findings:  Stigmata of cirrhosis with nodularity of the hepatic contour. Findings suggestive of portal venous hypertension with recanalization of the periumbilical vein and splenomegaly, with the spleen measuring approximately 17 cm in length (image 28, series 2).  The portal  vein is patent. There are no definitive gastric varices identified on this examination.  There are multiple hypoattenuating lesions scattered throughout the liver with dominant lesion within the inferior aspect of the right lobe of liver measuring 8 mm in diameter (image 7, series 5, too small to accurately characterize.   Normal appearance of the gallbladder. No intra or extrahepatic biliary duct dilatation.  No ascites.  There is a minimal amount of stranding about the tail of the pancreas (image 38 and 41, series 2 which will  nonspecific may be indicative of acute uncomplicated pancreatitis.  There is homogeneous enhancement of the pancreatic parenchyma.  No definite pancreatic duct dilatation on this non pancreatic protocol CT.  There is symmetric enhancement and excretion of the bilateral kidneys.  Note is made of a slightly horizontal lie of the left kidney, likely secondary to mass effect from the enlarged spleen. No discrete renal lesions.  No definite renal stones in the postcontrast examination.  No urinary obstruction. Normal appearance of the bilateral adrenal glands.  Moderate to large sized hiatal hernia.  Ingested enteric contrast extends to the level of the rectum.  No evidence of enteric obstruction.  The bowel is normal in course and caliber without wall thickening or evidence of obstruction.  Normal appearance of the appendix.  No pneumoperitoneum, pneumatosis or portal venous gas.  Limited visualization of the lower thorax is negative for focal airspace opacity or pleural effusion.  Normal heart size.  No pericardial effusion.  No acute or aggressive osseous abnormalities.  IMPRESSION:  1.  Subtle stranding about the tail of the pancreas suggestive of acute uncomplicated pancreatitis.  Correlation with amylase and lipase is recommended. 2.  Stigmata of cirrhosis and portal venous hypertension as above. 3.  There are multiple hypoattenuating lesions scattered throughout the liver which are too small to accurately characterize.  Given stigmata of cirrhosis, further evaluation for hepatoma may be obtained with non emergent abdominal MRI as clinically indicated.  4.  Moderate to large size hiatal hernia.   Original Report Authenticated By: Tacey Ruiz, MD   Dg Chest Port 1 View  08/10/2012   *RADIOLOGY REPORT*  Clinical Data: Fever question pneumonia  PORTABLE CHEST - 1 VIEW  Comparison: Portable exam 1131 hours compared to 08/06/2012  Findings: Monitoring lines project over chest. Enlargement of cardiac silhouette.  Mediastinal contours and pulmonary vascularity normal. Minimal atelectasis at right base. Lungs otherwise clear. No pleural effusion or pneumothorax. No acute osseous findings.  IMPRESSION: Enlargement of cardiac silhouette. Minimal right basilar atelectasis.   Original Report Authenticated By: Ulyses Southward, M.D.   Dg Abd Portable 1v  08/10/2012   *RADIOLOGY REPORT*  Clinical Data:  nasogastric tube placement  PORTABLE ABDOMEN - 1 VIEW  Comparison:  Study obtained earlier in the day  Findings:  Nasogastric tube tip and side port in the stomach. There is contrast in the ascending colon.  The bowel gas pattern overall is nonspecific.  No obstruction or free air is seen on this supine examination.  IMPRESSION: Nasogastric tube tip and sideport in stomach.  Nonspecific gas pattern.   Original Report Authenticated By: Bretta Bang, M.D.   Dg Abd Portable 1v  08/10/2012   *RADIOLOGY REPORT*  Clinical Data: Evaluate nasogastric tube placement.  PORTABLE ABDOMEN - 1 VIEW  Comparison: No priors.  Findings: No nasogastric tube is identified.  Gas, stool and oral contrast material is noted throughout the colon.  There are a few nondilated loops of gas-filled small bowel in the central  abdomen which are nonspecific.  No gross evidence of pneumoperitoneum on this single supine view.  IMPRESSION: 1.  No nasogastric tube identified.  Clinical correlation is suggested. 2.  Nonspecific, nonobstructive bowel gas pattern, as above.   Original Report Authenticated By: Trudie Reed, M.D.    Admission HPI: Mr. Ricketts 42 yo man former [facility] pt with a pmh of excessive EtOH use consisting of a 12 pack beer per day last drink being morning of admission and HTN. Pt was experiencing generalized epigastric pain that worsened overnight and unrelieved by pepto bismol. Pt has had one similar episode that was less severe in nature 4+ months ago but didn't seek medical attention. Pt has not seen physician in 6+ months and was  not compliant with HTN meds. Pt has had some epistasis episodes over past 2 months and easy bruising but no sites of active bleeding, hematemesis, hematochezia, PRBPR, or hemoptysis. Denied any fever/chills, diarrhea, constipation, CP, SOB, HA, blurry vision, recent sick contacts, or travel. 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 Per the pt and mother who is present for the interview states pt has been to rehab and detox several times with longest period of sobriety lasting about 5 months. Relapsing triggers appear to be social groups and celebrations.   Hospital Course by problem list: # EtOH withdrawal vs Acute encephalopathy: Pt known heavy EtOH use and been in and out of inpt and outpt rehab/detox centers without success for several years. Ammonia level increased at 63 during ICU stay. Required precedex, clonidine, ativan, and lactulose for control of DTs. Extensive education in terms of alcohol cessation and sequale already present as related to continued EtOH use had with patient and pts family members throughout admission. Pt was managed with ativan, lactulose, clonidine before d/c. Given extensive hx of heavy EtOH use and abuse and high relapse didn't feel comfortable d/c pt with benzodiazepines and this discussion was extensively had with patient and patient family members including fiancee and mother. CSW gave pt resources/pt is noncommittal at this time   # SIRS of unknown source: Pt had fever while in ICU some ? Parotiditis and all other extensive infectious workup was negative. Pt received 3 doses of IV Vanc/zosyn and then transitioned to levofloxacin where pt completed a 10 day course while admitted. Pt had some oral thrush as well in ICU and started on Nystatin. Pt had only completed 4/7 day course of nystatin and was d/c with nystatin.   # Acute uncomplicated pancreatitis: On admission pt had elevated lipase in 140s that has now  normalized and CT abd findings consistent with uncomplicated pancreatitis. Pt was given TF during ICU stay but has now transitioned to oral diet and tolerating well. Pt able to ambulate and PT/OT state no further follow up needed.   # HTN: ACS workup negative had no evidence of shock while in ICU. SBP 150s this AM. Managed on HCTZ 12.5mg  and clonidine which pt was discharged on.   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 No active bleeding throughout hospital stay and no need for blood products. Would follow up as outpt.   Discharge Vitals:  BP 132/84  Pulse 55  Temp(Src) 98.5 F (36.9 C) (Oral)  Resp 18  Ht 5\' 7"  (1.702 m)  Wt 156 lb 15.5 oz (71.2 kg)  BMI 24.58 kg/m2  SpO2 98% General: ambulating in hall, NAD  HEENT:  PERRL, EOMI, no scleral icterus  Cardiac: RRR, no rubs, murmurs or gallops  Pulm: clear to auscultation bilaterally, moving normal volumes of air  Abd: soft, nontender, nondistended, BS present  Ext: warm and well perfused, no pedal edema  Neuro: alert and oriented X3, cranial nerves II-XII grossly intact, minimal asterxisis, no tremors, normal gait  Discharge Labs:  Results for orders placed during the hospital encounter of 08/06/12 (from the past 24 hour(s))  MAGNESIUM     Status: None   Collection Time    08/17/12  4:20 AM      Result Value Range   Magnesium 1.9  1.5 - 2.5 mg/dL  CBC WITH DIFFERENTIAL     Status: Abnormal   Collection Time    08/17/12  4:20 AM      Result Value Range   WBC 5.3  4.0 - 10.5 K/uL   RBC 4.54  4.22 - 5.81 MIL/uL   Hemoglobin 14.2  13.0 - 17.0 g/dL   HCT 16.1  09.6 - 04.5 %   MCV 93.0  78.0 - 100.0 fL   MCH 31.3  26.0 - 34.0 pg   MCHC 33.6  30.0 - 36.0 g/dL   RDW 40.9  81.1 - 91.4 %   Platelets 63 (*) 150 - 400 K/uL   Neutrophils Relative % 62  43 - 77 %   Neutro Abs 3.3  1.7 - 7.7 K/uL   Lymphocytes Relative 24  12 - 46 %   Lymphs Abs 1.3  0.7 - 4.0 K/uL    Monocytes Relative 11  3 - 12 %   Monocytes Absolute 0.6  0.1 - 1.0 K/uL   Eosinophils Relative 2  0 - 5 %   Eosinophils Absolute 0.1  0.0 - 0.7 K/uL   Basophils Relative 1  0 - 1 %   Basophils Absolute 0.0  0.0 - 0.1 K/uL  BASIC METABOLIC PANEL     Status: None   Collection Time    08/17/12  4:20 AM      Result Value Range   Sodium 137  135 - 145 mEq/L   Potassium 3.9  3.5 - 5.1 mEq/L   Chloride 100  96 - 112 mEq/L   CO2 29  19 - 32 mEq/L   Glucose, Bld 88  70 - 99 mg/dL   BUN 7  6 - 23 mg/dL   Creatinine, Ser 7.82  0.50 - 1.35 mg/dL   Calcium 9.1  8.4 - 95.6 mg/dL   GFR calc non Af Amer >90  >90 mL/min   GFR calc Af Amer >90  >90 mL/min    Signed: Christen Bame 08/17/2012, 11:02 AM   Time Spent on Discharge: 35 min  Services Ordered on Discharge: none Equipment Ordered on Discharge: none

## 2012-08-24 ENCOUNTER — Ambulatory Visit: Payer: Self-pay | Attending: Family Medicine | Admitting: Family Medicine

## 2012-08-24 ENCOUNTER — Encounter: Payer: Self-pay | Admitting: Family Medicine

## 2012-08-24 VITALS — BP 107/73 | HR 75 | Temp 98.4°F | Resp 16 | Wt 160.4 lb

## 2012-08-24 DIAGNOSIS — D696 Thrombocytopenia, unspecified: Secondary | ICD-10-CM

## 2012-08-24 LAB — CBC WITH DIFFERENTIAL/PLATELET
Basophils Absolute: 0.1 10*3/uL (ref 0.0–0.1)
HCT: 39.4 % (ref 39.0–52.0)
Lymphocytes Relative: 25 % (ref 12–46)
Neutro Abs: 2.7 10*3/uL (ref 1.7–7.7)
Platelets: 92 10*3/uL — ABNORMAL LOW (ref 150–400)
RDW: 14.5 % (ref 11.5–15.5)
WBC: 4.6 10*3/uL (ref 4.0–10.5)

## 2012-08-24 NOTE — Progress Notes (Signed)
Patient is a hospital follow up Pancreatitis Liver and speen problems Was hospitalized for 13 days

## 2012-08-24 NOTE — Patient Instructions (Signed)
Melatonin oral capsules and tablets What is this medicine? MELATONIN (mel uh TOH nin) is a dietary supplement. It is promoted to help maintain normal sleep patterns. The FDA has not approved this supplement for any medical use. This supplement may be used for other purposes; ask your health care provider or pharmacist if you have questions. This medicine may be used for other purposes; ask your health care provider or pharmacist if you have questions. What should I tell my health care provider before I take this medicine? They need to know if you have any of these conditions: -cancer -if you frequently drink alcohol containing drinks -immune system problems -liver disease -seizure disorder -an unusual or allergic reaction to melatonin, other medicines, foods, dyes, or preservatives -pregnant or trying to get pregnant -breast-feeding How should I use this medicine? Take this supplement by mouth with a glass of water. Follow the directions on the package labeling, or take as directed by your health care professional. Do not chew or crush this medicine. Do not take this medicine more often than directed. Talk to your pediatrician regarding the use of this supplement in children. This supplement is not recommended for use in children. Overdosage: If you think you have taken too much of this medicine contact a poison control center or emergency room at once. NOTE: This medicine is only for you. Do not share this medicine with others. What if I miss a dose? This does not apply; this medicine is not for regular use. Do not take double or extra doses. What may interact with this medicine? Check with your doctor or healthcare professional if you are taking any of the following medications: -hormone medicines -medicines for blood pressure like nifedipine -medications for anxiety, depression, or other emotional or psychiatric problems -medications for seizures -medications for sleep -other herbal  or dietary supplements -stimulant medicines like amphetamine, dextroamphetamine -tamoxifen -treatments for cancer or immune disorders This list may not describe all possible interactions. Give your health care provider a list of all the medicines, herbs, non-prescription drugs, or dietary supplements you use. Also tell them if you smoke, drink alcohol, or use illegal drugs. Some items may interact with your medicine. What should I watch for while using this medicine? See your doctor if your symptoms do not get better or if they get worse. Do not take this supplement for more than 2 weeks unless your doctor tells you to. You may get drowsy or dizzy. Do not drive, use machinery, or do anything that needs mental alertness until you know how this medicine affects you. Do not stand or sit up quickly, especially if you are an older patient. This reduces the risk of dizzy or fainting spells. Alcohol may interfere with the effect of this medicine. Avoid alcoholic drinks. Talk to your doctor before you use this supplement if you are currently being treated for an emotional, mental, or sleep problem. This medicine may interfere with your treatment. Herbal or dietary supplements are not regulated like medicines. Rigid quality control standards are not required for dietary supplements. The purity and strength of these products can vary. The safety and effect of this dietary supplement for a certain disease or illness is not well known. This product is not intended to diagnose, treat, cure or prevent any disease. The Food and Drug Administration suggests the following to help consumers protect themselves: -Always read product labels and follow directions. -Natural does not mean a product is safe for humans to take. -Look for products that include  USP after the ingredient name. This means that the manufacturer followed the standards of the [place] Pharmacopoeia. -Supplements made or sold by a nationally known food or  drug company are more likely to be made under tight controls. You can write to the company for more information about how the product was made. What side effects may I notice from receiving this medicine? Side effects that you should report to your doctor or health care professional as soon as possible: -allergic reactions like skin rash, itching or hives, swelling of the face, lips, or tongue -breathing problems -confusion, forgetful -depressed, nervous, or other mood changes -fast or pounding heartbeat -trouble staying awake or alert during the day Side effects that usually do not require medical attention (report to your doctor or health care professional if they continue or are bothersome): -drowsiness, dizziness -headache -nightmares -upset stomach This list may not describe all possible side effects. Call your doctor for medical advice about side effects. You may report side effects to FDA at 1-800-FDA-1088. Where should I keep my medicine? Keep out of the reach of children. Store at room temperature or as directed on the package label. Protect from moisture. Throw away any unused supplement after the expiration date. NOTE: This sheet is a summary. It may not cover all possible information. If you have questions about this medicine, talk to your doctor, pharmacist, or health care provider.  2012, Elsevier/Gold Standard. (09/27/2007 3:20:46 PM)

## 2012-08-24 NOTE — Progress Notes (Signed)
Subjective:     Patient ID: [name], male   DOB: Apr 16, 1970, 42 y.o.   MRN: [id number]  HPIPt here f/r hosp f/u having ben admitted with ICU stay for pancreatitis secondary to etoh. He has a long h/o etoh abuse and has been through rehab several times.   Today he is feeling better but has some problems sleeping at night and feels some anxiety. He has not drank etoh since his discharge. He sleeps for most of the day and has a hard time falling asleep at night. He drinks ensure and is starting to get an appetite back. He has not been going outside, getting an exercise, or doing any errands because he is home napping. He does not have any abd pain. He has thrush in the hospital and this has resolved. No mouth pain.    Review of Systems per hpi     Objective:   Physical Exam  Nursing note and vitals reviewed. Constitutional: He appears well-developed and well-nourished.  Cardiovascular: Normal rate, regular rhythm and normal heart sounds.   Pulmonary/Chest: Effort normal and breath sounds normal.  Abdominal: Soft. Bowel sounds are normal. He exhibits no distension. There is no tenderness.  Skin: Skin is warm and dry.  Psychiatric: He has a normal mood and affect.       Assessment:     Thrombocytopenia, unspecified - Plan: CBC with Differential  Pancreatitis   H/o etoh abuse  Anxiety  thrush    Plan:     Thrombocytopenia - checking cbc today  Pancreatitis - resolved  H/o etoh abuse - pt does not believe rehab will help him. He insists he is not going to start drinking again and he can do it himself. Ensured fiance had contact info for programs in case he changes his mind. Suggested at least going to meetings where he can be supported in sobriety. Pt declines.   Anxiety - pt requesting more xanax but I have significant concerns about this given his history. I note that the hospitalist has the same concern on d/c and documented a conversation about this with pt and fiance.  At this point pt encouraged to stop napping during the day so he can sleep at night. Can try melatonin before bed if needed. During the day I urge him to get outside, go for walks, go places such as the grocery store rather than sit on the couch as he has been. At f/u we can discuss other meds to deal with anxiety if indicated - perhaps an ssri. I do not think benzos are in his best interest chronically.    Thrush - resolved  Will see him back in 2 weeks, earlier if needed. At that point will review chronic problems and discuss potential need for MRI liver (see CT report)

## 2012-08-25 ENCOUNTER — Encounter: Payer: Self-pay | Admitting: Family Medicine

## 2012-09-07 ENCOUNTER — Ambulatory Visit: Payer: Self-pay | Attending: Family Medicine | Admitting: Internal Medicine

## 2012-09-07 VITALS — BP 104/66 | HR 65 | Temp 97.7°F | Resp 16 | Ht 65.0 in | Wt 161.0 lb

## 2012-09-07 DIAGNOSIS — D696 Thrombocytopenia, unspecified: Secondary | ICD-10-CM | POA: Insufficient documentation

## 2012-09-07 DIAGNOSIS — K7460 Unspecified cirrhosis of liver: Secondary | ICD-10-CM | POA: Insufficient documentation

## 2012-09-07 DIAGNOSIS — F411 Generalized anxiety disorder: Secondary | ICD-10-CM | POA: Insufficient documentation

## 2012-09-07 DIAGNOSIS — F1010 Alcohol abuse, uncomplicated: Secondary | ICD-10-CM | POA: Insufficient documentation

## 2012-09-07 DIAGNOSIS — G4700 Insomnia, unspecified: Secondary | ICD-10-CM | POA: Insufficient documentation

## 2012-09-07 DIAGNOSIS — K7030 Alcoholic cirrhosis of liver without ascites: Secondary | ICD-10-CM

## 2012-09-07 MED ORDER — ALPRAZOLAM 0.5 MG PO TABS: 0.2500 mg | ORAL_TABLET | Freq: Every evening | ORAL | Status: DC | PRN

## 2012-09-07 MED ORDER — TRAZODONE 25 MG HALF TABLET: 25.0000 mg | ORAL_TABLET | Freq: Every evening | ORAL | Status: DC | PRN

## 2012-09-07 NOTE — Progress Notes (Signed)
Patient  Here for follow up pancreatitis

## 2012-09-07 NOTE — Progress Notes (Signed)
Patient ID: REX OESTERLE, male   DOB: Aug 04, 1970, 42 y.o.   MRN: [id number] Patient Demographics  [name], is a 42 y.o. male  AOZ:[id number]  NGE:[id number]  DOB - 1970/07/20  Chief Complaint  Patient presents with  . Follow-up        Subjective:   [name] presents today with a chief complaint of follow up. 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  He has been doing well since discharge, was able to stay off of alcohol. The only thing that he complains about is his inability to sleep. Xanax is helping, but he tries to avoid and uses one pill every few days. He denies any confusion at home, denies any scleral icterus, denies any abdominal swelling, he denies any blood in the stool or dark tarry stools. Denies chest pain, denies any breathing difficulties.  ROS. Patient has no headache, no chest pain, no abdominal pain, no Nausea, no new weakness tingling or numbness, no cough or shortness of breath SOB.   Objective:    Filed Vitals:   09/07/12 1245  BP: 104/66  Pulse: 65  Temp: 97.7 F (36.5 C)  Resp: 16  Height: 5\' 5"  (1.651 m)  Weight: 161 lb (73.029 kg)  SpO2: 100%    Exam  GEN: Awake Alert, Oriented X 3,  EYES: PERRL, EOMI, no scleral icterus Head: NCAT Neck: Supple Neck,No JVD, No cervical lymphadenopathy appriciated.  Lungs: Symmetrical Chest wall movement, Good air movement bilaterally, CTAB CV: RRR,No Gallops,Rubs or new Murmurs, No Parasternal Heave Abdomen: +ve B.Sounds, Abd Soft, Non tender, No organomegaly appriciated, No rebound - guarding or rigidity. MSK: no peripheral edema Neuro: No focal deficits Skin: no rashes Psych: Normal affect  Data Review   CBC No results found for this basename: WBC, HGB, HCT, PLT, MCV, MCH, MCHC, RDW, NEUTRABS, LYMPHSABS, MONOABS, EOSABS, BASOSABS, BANDABS, BANDSABD,  in the last 168  hours  Chemistries  No results found for this basename: NA, K, CL, CO2, GLUCOSE, BUN, CREATININE, GFRCGP, CALCIUM, MG, AST, ALT, ALKPHOS, BILITOT,  in the last 168 hours ------------------------------------------------------------------------------------------------------------------ No results found for this basename: HGBA1C,  in the last 72 hours ------------------------------------------------------------------------------------------------------------------ No results found for this basename: CHOL, HDL, LDLCALC, TRIG, CHOLHDL, LDLDIRECT,  in the last 72 hours ------------------------------------------------------------------------------------------------------------------ No results found for this basename: TSH, T4TOTAL, FREET3, T3FREE, THYROIDAB,  in the last 72 hours ------------------------------------------------------------------------------------------------------------------ No results found for this basename: VITAMINB12, FOLATE, FERRITIN, TIBC, IRON, RETICCTPCT,  in the last 72 hours  Coagulation profile  No results found for this basename: INR, PROTIME,  in the last 168 hours  Prior to Admission medications   Medication Sig Start Date End Date Taking? Authorizing Provider  ALPRAZolam Prudy Feeler) 0.5 MG tablet Take 0.5 tablets (0.25 mg total) by mouth at bedtime as needed for anxiety. 09/07/12   Pamella Pert, MD  cloNIDine (CATAPRES) 0.1 MG tablet Take 1 tablet (0.1 mg total) by mouth every 8 (eight) hours. 08/17/12   Christen Bame, MD  fluconazole (DIFLUCAN) 100 MG tablet Take 1 tablet (100 mg total) by mouth daily. 08/17/12   Christen Bame, MD  hydrochlorothiazide (MICROZIDE) 12.5 MG capsule Take 1 capsule (12.5 mg total) by mouth daily. 08/17/12   Christen Bame, MD  lactulose (CHRONULAC) 10 GM/15ML solution Take 45 mLs (30 g total) by mouth 3 (three) times daily. 08/17/12   Christen Bame, MD  pantoprazole (PROTONIX) 40 MG tablet Take 1  tablet (40 mg total) by mouth daily at 12 noon. 08/17/12    Christen Bame, MD  traZODone (DESYREL) 25 mg TABS Take 0.5 tablets (25 mg total) by mouth at bedtime as needed. 09/07/12   Pamella Pert, MD     Assessment & Plan    ETOH abuse - congratulated patient on continued abstinence  Cirrhosis - referral to GI today as ne will need regular follow up with them as well.   Anxiety with inability to sleep - counseled to avoid Xanax as much as possible as we are not able to provide long term. 10 pills Xanax provided today.  - start low dose trazodone for sleep  Thrombocytopenia - looked stable last time he came to clinic, improved since his hospital stay. - will not check today, encouraged continued alcohol cessation.    Pamella Pert M.D on 09/07/2012 at 1:22 PM

## 2012-09-22 ENCOUNTER — Telehealth: Payer: Self-pay | Admitting: Family Medicine

## 2012-09-22 NOTE — Telephone Encounter (Signed)
Pt calling back about appt date for GI in [facility].  Please f/u with pt.

## 2012-09-23 NOTE — Telephone Encounter (Signed)
I spoke to patient and he is aware of his appointment 11/03/12 @ 1:45pm  At [facility]

## 2013-01-27 ENCOUNTER — Other Ambulatory Visit: Payer: Self-pay

## 2013-05-09 ENCOUNTER — Encounter (HOSPITAL_COMMUNITY): Payer: Self-pay | Admitting: Emergency Medicine

## 2013-05-09 ENCOUNTER — Emergency Department (HOSPITAL_COMMUNITY): Payer: Self-pay

## 2013-05-09 ENCOUNTER — Emergency Department (HOSPITAL_COMMUNITY)
Admission: EM | Admit: 2013-05-09 | Discharge: 2013-05-09 | Disposition: A | Discharge status: Home / Self Care | Payer: Self-pay | Attending: Emergency Medicine | Admitting: Emergency Medicine

## 2013-05-09 DIAGNOSIS — Z87891 Personal history of nicotine dependence: Secondary | ICD-10-CM | POA: Insufficient documentation

## 2013-05-09 DIAGNOSIS — Y9269 Other specified industrial and construction area as the place of occurrence of the external cause: Secondary | ICD-10-CM | POA: Insufficient documentation

## 2013-05-09 DIAGNOSIS — R42 Dizziness and giddiness: Secondary | ICD-10-CM | POA: Insufficient documentation

## 2013-05-09 DIAGNOSIS — H6690 Otitis media, unspecified, unspecified ear: Secondary | ICD-10-CM | POA: Insufficient documentation

## 2013-05-09 DIAGNOSIS — Y939 Activity, unspecified: Secondary | ICD-10-CM | POA: Insufficient documentation

## 2013-05-09 DIAGNOSIS — S1093XA Contusion of unspecified part of neck, initial encounter: Principal | ICD-10-CM

## 2013-05-09 DIAGNOSIS — S0083XA Contusion of other part of head, initial encounter: Principal | ICD-10-CM

## 2013-05-09 DIAGNOSIS — W208XXA Other cause of strike by thrown, projected or falling object, initial encounter: Secondary | ICD-10-CM | POA: Insufficient documentation

## 2013-05-09 DIAGNOSIS — I10 Essential (primary) hypertension: Secondary | ICD-10-CM | POA: Insufficient documentation

## 2013-05-09 DIAGNOSIS — Z86718 Personal history of other venous thrombosis and embolism: Secondary | ICD-10-CM | POA: Insufficient documentation

## 2013-05-09 DIAGNOSIS — S0003XA Contusion of scalp, initial encounter: Secondary | ICD-10-CM | POA: Insufficient documentation

## 2013-05-09 DIAGNOSIS — Y990 Civilian activity done for income or pay: Secondary | ICD-10-CM | POA: Insufficient documentation

## 2013-05-09 DIAGNOSIS — Z8659 Personal history of other mental and behavioral disorders: Secondary | ICD-10-CM | POA: Insufficient documentation

## 2013-05-09 DIAGNOSIS — S199XXA Unspecified injury of neck, initial encounter: Secondary | ICD-10-CM

## 2013-05-09 DIAGNOSIS — H6692 Otitis media, unspecified, left ear: Secondary | ICD-10-CM

## 2013-05-09 DIAGNOSIS — S0993XA Unspecified injury of face, initial encounter: Secondary | ICD-10-CM | POA: Insufficient documentation

## 2013-05-09 MED ORDER — PSEUDOEPHEDRINE HCL ER 120 MG PO TB12
120.0000 mg | ORAL_TABLET | Freq: Two times a day (BID) | ORAL | Status: DC
Start: 2013-05-09 — End: 2013-05-09
  Administered 2013-05-09: 120 mg via ORAL
  Filled 2013-05-09: qty 1

## 2013-05-09 MED ORDER — AMOXICILLIN 500 MG PO CAPS: 1000.0000 mg | ORAL_CAPSULE | Freq: Three times a day (TID) | ORAL | Status: DC

## 2013-05-09 MED ORDER — AMOXICILLIN 500 MG PO CAPS
1000.0000 mg | ORAL_CAPSULE | Freq: Once | ORAL | Status: AC
  Administered 2013-05-09: 1000 mg via ORAL
  Filled 2013-05-09: qty 2

## 2013-05-09 MED ORDER — LORATADINE 10 MG PO TABS
10.0000 mg | ORAL_TABLET | Freq: Every day | ORAL | Status: DC
  Administered 2013-05-09: 10 mg via ORAL
  Filled 2013-05-09: qty 1

## 2013-05-09 NOTE — Discharge Instructions (Signed)
Take amoxicillin (antibiotic) as prescribed. Take zyrtec or claritin (antihistamine) as need.  Avoid smoking. Follow up with ENT doctor in 1-2 weeks if symptoms fail to improve/resolve. Return to ER if worse, new symptoms, severe ear pain, severe headache, severe dizziness, other concern.     Otitis Media, Adult Otitis media is redness, soreness, and swelling (inflammation) of the middle ear. Otitis media may be caused by allergies or, most commonly, by infection. Often it occurs as a complication of the common cold. SIGNS AND SYMPTOMS Symptoms of otitis media may include:  Earache.  Fever.  Ringing in your ear.  Headache.  Leakage of fluid from the ear. DIAGNOSIS To diagnose otitis media, your health care provider will examine your ear with an otoscope. This is an instrument that allows your health care provider to see into your ear in order to examine your eardrum. Your health care provider also will ask you questions about your symptoms. TREATMENT  Typically, otitis media resolves on its own within 3 5 days. Your health care provider may prescribe medicine to ease your symptoms of pain. If otitis media does not resolve within 5 days or is recurrent, your health care provider may prescribe antibiotic medicines if he or she suspects that a bacterial infection is the cause. HOME CARE INSTRUCTIONS   Take your medicine as directed until it is gone, even if you feel better after the first few days.  Only take over-the-counter or prescription medicines for pain, discomfort, or fever as directed by your health care provider.  Follow up with your health care provider as directed. SEEK MEDICAL CARE IF:  You have otitis media only in one ear or bleeding from your nose or both.  You notice a lump on your neck.  You are not getting better in 3 5 days.  You feel worse instead of better. SEEK IMMEDIATE MEDICAL CARE IF:   You have pain that is not controlled with medicine.  You have  swelling, redness, or pain around your ear or stiffness in your neck.  You notice that part of your face is paralyzed.  You notice that the bone behind your ear (mastoid) is tender when you touch it. MAKE SURE YOU:   Understand these instructions.  Will watch your condition.  Will get help right away if you are not doing well or get worse. Document Released: 12/14/2003 Document Revised: 12/29/2012 Document Reviewed: 10/05/2012 [facility] Patient Information 2014 [place], [place].
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
## 2013-05-09 NOTE — Progress Notes (Signed)
[facility] provided pt with a list of primary care resources to help patient establish primary care.  °

## 2013-05-09 NOTE — ED Notes (Signed)
Pt transported to CT at present time. 
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
## 2014-08-03 IMAGING — CR DG CHEST 2V
2 series · 2 of 2 positions shown · non-contrast
Comparison: 12/21/2008

CLINICAL DATA: Abdominal pain, hypertension

CHEST - 2 VIEW

[w chest pa]
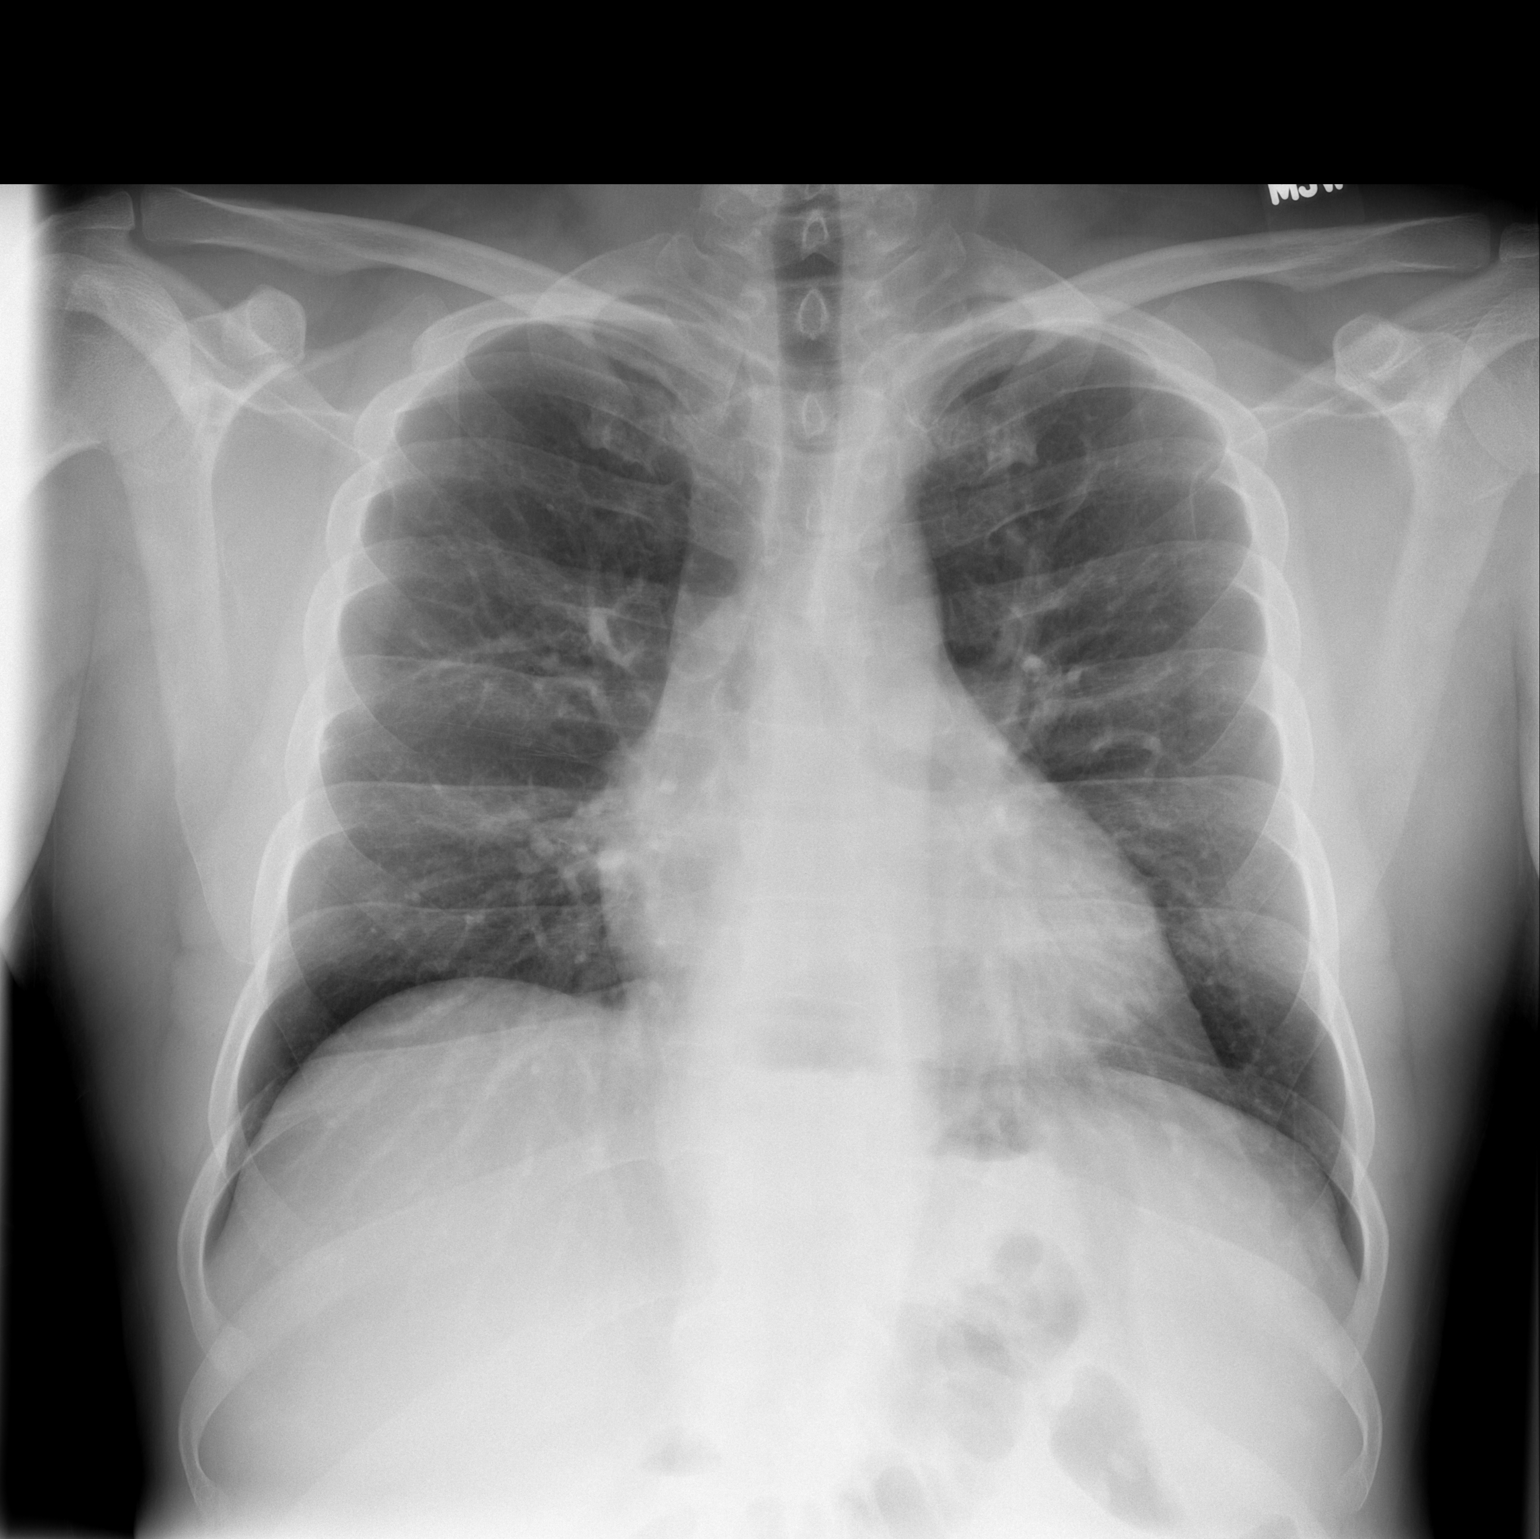

[w chest lat]
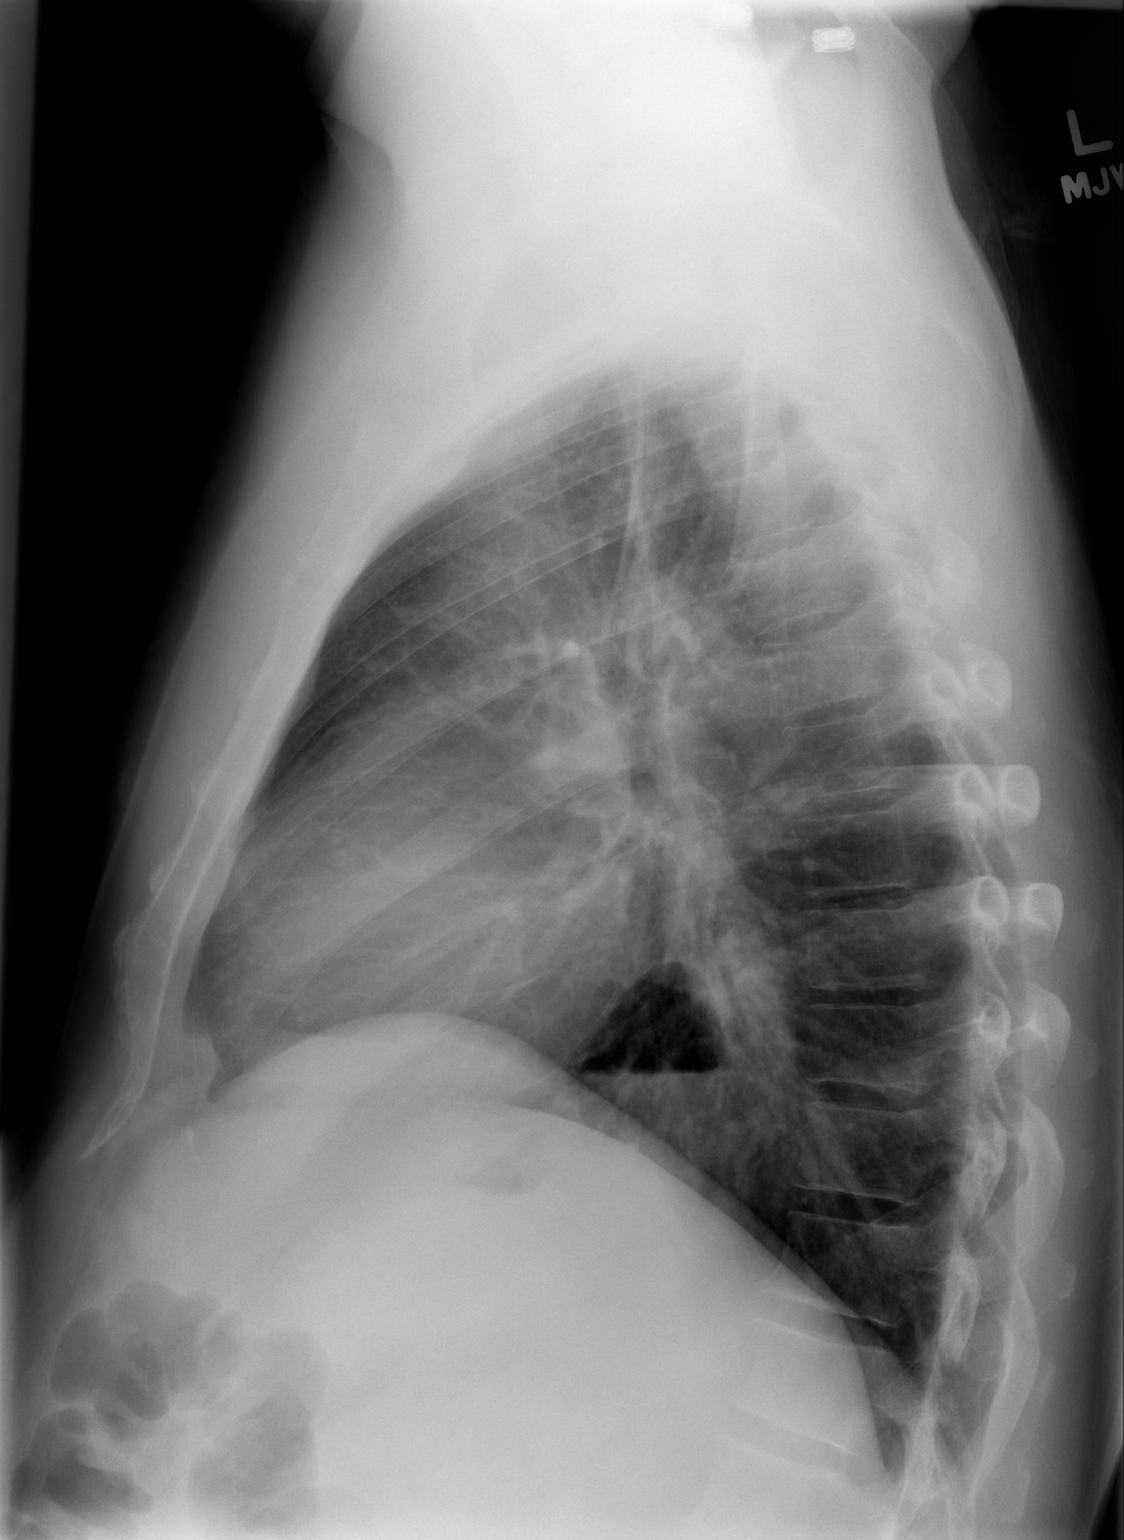

[2 of 2 positions shown; findings below may reference images not displayed]

FINDINGS: Stable heart size and vascularity.  No focal pneumonia,
collapse, consolidation, edema pattern, effusion or pneumothorax.
Trachea midline.  Hiatal hernia noted with an air-fluid level.
IMPRESSION: Hiatal hernia.  No acute chest process.

## 2014-08-07 IMAGING — CR DG CHEST 1V PORT
2 series · 2 of 2 positions shown · non-contrast
Comparison: Portable exam 2202 hours compared to 08/06/2012

CLINICAL DATA: Fever question pneumonia

PORTABLE CHEST - 1 VIEW

[AP (1 of 2)]
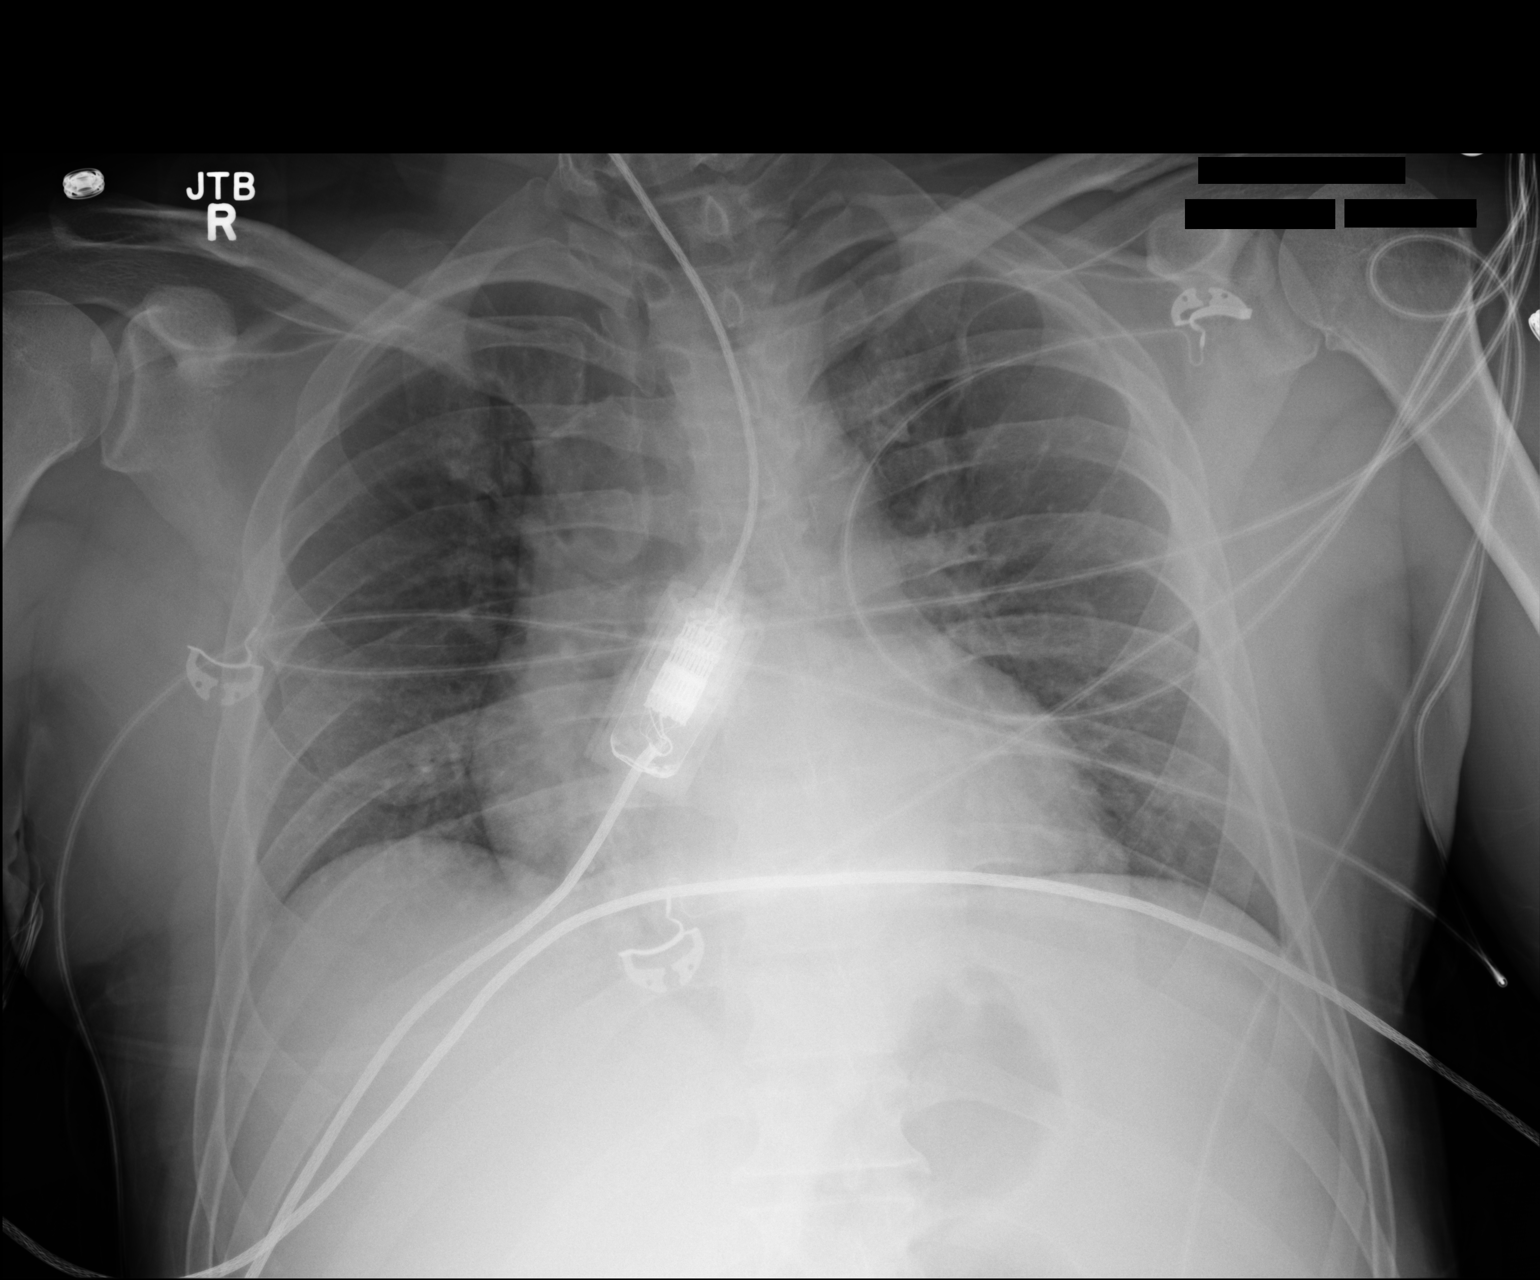

[AP (2 of 2)]
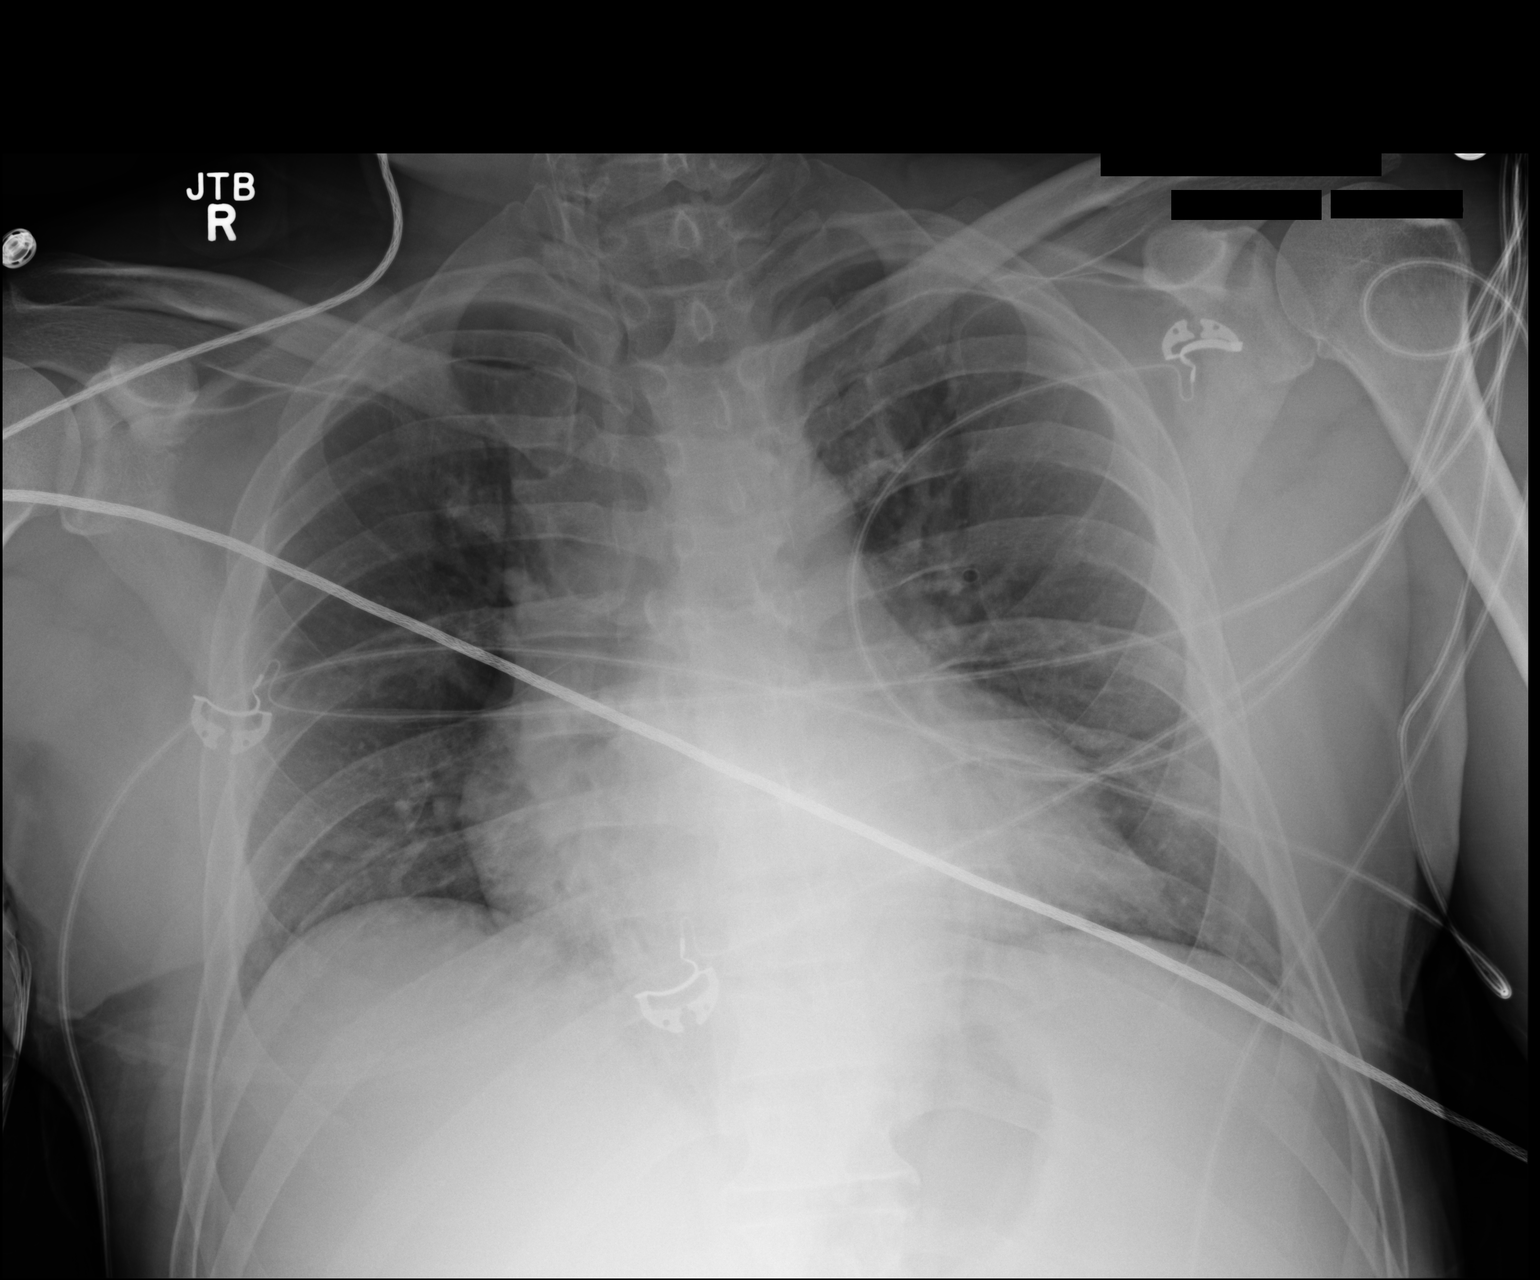

[2 of 2 positions shown; findings below may reference images not displayed]

FINDINGS: Monitoring lines project over chest.
Enlargement of cardiac silhouette.
Mediastinal contours and pulmonary vascularity normal.
Minimal atelectasis at right base.
Lungs otherwise clear.
No pleural effusion or pneumothorax.
No acute osseous findings.
IMPRESSION: Enlargement of cardiac silhouette.
Minimal right basilar atelectasis.

## 2014-08-07 IMAGING — CR DG ABD PORTABLE 1V
1 series · 1 of 1 positions shown · non-contrast
Comparison: No priors.

CLINICAL DATA: Evaluate nasogastric tube placement.

PORTABLE ABDOMEN - 1 VIEW

[AP]
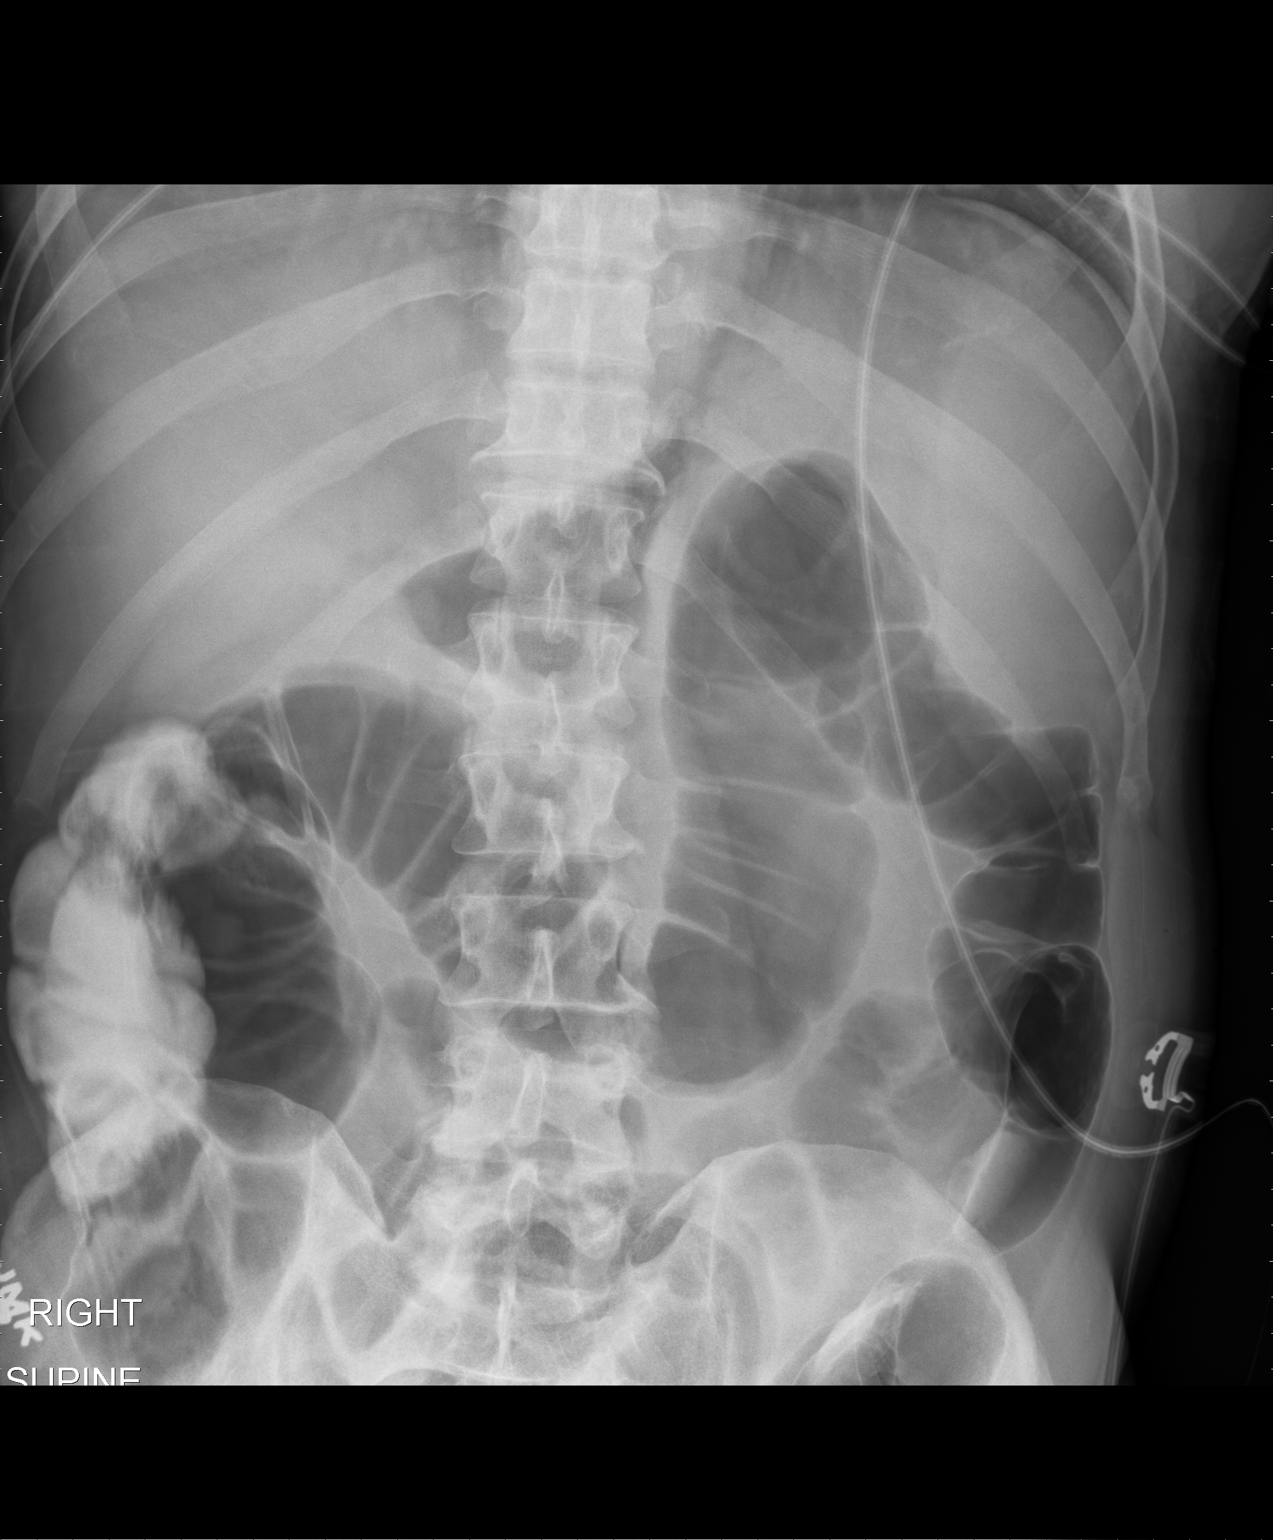

[1 of 1 positions shown; findings below may reference images not displayed]

FINDINGS: No nasogastric tube is identified.  Gas, stool and oral
contrast material is noted throughout the colon.  There are a few
nondilated loops of gas-filled small bowel in the central abdomen
which are nonspecific.  No gross evidence of pneumoperitoneum on
this single supine view.
IMPRESSION: 1.  No nasogastric tube identified.  Clinical correlation is
suggested.
2.  Nonspecific, nonobstructive bowel gas pattern, as above.

## 2014-11-22 ENCOUNTER — Encounter: Payer: Self-pay | Admitting: Family Medicine

## 2014-11-22 ENCOUNTER — Ambulatory Visit (INDEPENDENT_AMBULATORY_CARE_PROVIDER_SITE_OTHER): Payer: Self-pay | Admitting: Family Medicine

## 2014-11-22 VITALS — BP 131/71 | HR 73 | Temp 98.9°F | Resp 16 | Ht 67.0 in | Wt 171.0 lb

## 2014-11-22 DIAGNOSIS — Z7189 Other specified counseling: Secondary | ICD-10-CM

## 2014-11-22 DIAGNOSIS — D696 Thrombocytopenia, unspecified: Secondary | ICD-10-CM

## 2014-11-22 DIAGNOSIS — Z7689 Persons encountering health services in other specified circumstances: Secondary | ICD-10-CM

## 2014-11-22 NOTE — Patient Instructions (Signed)
Work on getting your orange card as soon as possible so we can try to get you in to see a hematologist. Follow-up here in one month for review of health maintenance needs.

## 2014-11-22 NOTE — Progress Notes (Signed)
Patient ID: [name], male   DOB: 11-Mar-1971, 44 y.o.   MRN: [id number]   [name], is a 44 y.o. male  IRC:[id number]  PZW:[id number]  DOB - Oct 16, 1970  CC:  Chief Complaint  Patient presents with  . Establish Care    pain in right side comes and goes        HPI: [name] is a 44 y.o. male here to establish care. He has recently applied for a job with the [employer]. He had labs drawn in preparation for that and was found to have a WBC of 3.4 and platelets of 34 with reference range being 140-396. They are unable to hire him until he has been assessed by a hematologist and it is determined to be a benign condition. He brings copies of his labs with him today. He is currently uninsured and has no means of paying for a referral to a hematologist. He has a history of hypertension, DVT, alcoholism with alcoholic cirrossis and pancreatitis. He also has unspecified hyperlidemia, splenomegaly and anxiety. Other medication history is recorded in the past medical history and problem list.The labs he brings show normal liver function except for and ALT of 34. No Known Allergies Past Medical History  Diagnosis Date  . DVT (deep venous thrombosis)   . Anxiety   . Alcoholic   . Hypertension    Current Outpatient Prescriptions on File Prior to Visit  Medication Sig Dispense Refill  . acetaminophen (TYLENOL) 325 MG tablet Take 325 mg by mouth every 6 (six) hours as needed (pain/headache).     No current facility-administered medications on file prior to visit.   Family History  Problem Relation Age of Onset  . Hypertension Father    Social History   Social History  . Marital Status: Single    Spouse Name: N/A  . Number of Children: N/A  . Years of Education: N/A   Occupational History  . Not on file.   Social History Main Topics  . Smoking status: Never Smoker   . Smokeless tobacco: Not on file  . Alcohol Use: No  . Drug Use: No     Comment: marijuana in  the past  . Sexual Activity: Not on file   Other Topics Concern  . Not on file   Social History Narrative   Pt works odd Architect jobs and delivers furniture. Lives with fiancee in [place], [place].     Review of Systems: Constitutional: Negative for fever, chills, appetite change, weight loss,  fatigue. HENT: Negative for ear pain, ear discharge.nose bleeds Eyes: Negative for pain, discharge, redness, itching and visual disturbance. Neck: Negative for pain, stiffness Respiratory: Negative for cough, shortness of breath,   Cardiovascular: Negative for chest pain, palpitations and leg swelling. Gastrointestinal: Negative for abdominal distention, abdominal pain, nausea, vomiting, diarrhea, constipations Genitourinary: Negative for dysuria, urgency, frequency, hematuria, flank pain,  Musculoskeletal: Negative for back pain, joint pain, joint  swelling, arthralgia and gait problem.Negative for weakness. Neurological: Negative for dizziness, tremors, seizures, syncope,   light-headedness, numbness and headaches.  Hematological: Negative for easy bruising or bleeding Psychiatric/Behavioral: Negative for depression, anxiety, decreased concentration, confusion    Objective:   Filed Vitals:   11/22/14 0841  BP: 131/71  Pulse: 73  Temp: 98.9 F (37.2 C)  Resp: 16    Physical Exam: Constitutional: Patient appears well-developed and well-nourished. No distress.Appears mildly unkempt. HENT: Normocephalic, atraumatic, External right and left ear normal. Oropharynx is clear and moist.  Eyes: Conjunctivae  and EOM are normal. PERRLA, no scleral icterus. Neck: Normal ROM. Neck supple. No lymphadenopathy, No thyromegaly. CVS: RRR, S1/S2 +, no murmurs, no gallops, no rubs Pulmonary: Effort and breath sounds normal, no stridor, rhonchi, wheezes, rales.  Abdominal: Soft. Normoactive BS,, no distension, tenderness, rebound or guarding.  Musculoskeletal: Normal range of motion. No edema and  no tenderness.  Neuro: Alert.Normal muscle tone coordination. Non-focal Skin: Skin is warm and dry. No rash noted. Not diaphoretic. No erythema. No pallor. Psychiatric: Normal mood and affect. Behavior, judgment, thought content normal.  Lab Results  Component Value Date   WBC 4.6 08/24/2012   HGB 13.7 08/24/2012   HCT 39.4 08/24/2012   MCV 87.8 08/24/2012   PLT 92* 08/24/2012   Lab Results  Component Value Date   CREATININE 0.75 08/17/2012   BUN 7 08/17/2012   NA 137 08/17/2012   K 3.9 08/17/2012   CL 100 08/17/2012   CO2 29 08/17/2012    No results found for: HGBA1C Lipid Panel     Component Value Date/Time   CHOL 259* 08/06/2012 1650   TRIG 109 08/06/2012 1650   HDL 50 08/06/2012 1650   CHOLHDL 5.2 08/06/2012 1650   VLDL 22 08/06/2012 1650   LDLCALC 187* 08/06/2012 1650       Assessment and plan:   Visit to establish care -I have review information provided by the patient And have review labs and had scanned into chart.   Thromocytopenia -Referral to hematologist  Return in about 1 month (around 12/22/2014).  The patient was given clear instructions to go to ER or return to medical center if symptoms don't improve, worsen or new problems develop. The patient verbalized understanding.      Micheline Chapman, MSN, FNP-BC   11/22/2014, 1:53 PM

## 2014-12-26 ENCOUNTER — Ambulatory Visit: Payer: Self-pay | Admitting: Family Medicine

## 2020-12-09 ENCOUNTER — Other Ambulatory Visit: Payer: Self-pay

## 2020-12-09 ENCOUNTER — Other Ambulatory Visit (HOSPITAL_COMMUNITY)
Admission: EM | Admit: 2020-12-09 | Discharge: 2020-12-13 | Disposition: A | Discharge status: Home / Self Care | Payer: No Payment, Other | Attending: Psychiatry | Admitting: Psychiatry

## 2020-12-09 ENCOUNTER — Ambulatory Visit (HOSPITAL_COMMUNITY)
Admission: RE | Admit: 2020-12-09 | Discharge: 2020-12-09 | Disposition: A | Discharge status: Home / Self Care | Payer: Self-pay | Attending: Emergency Medicine | Admitting: Emergency Medicine

## 2020-12-09 DIAGNOSIS — F10930 Alcohol use, unspecified with withdrawal, uncomplicated: Secondary | ICD-10-CM

## 2020-12-09 DIAGNOSIS — Z20822 Contact with and (suspected) exposure to covid-19: Secondary | ICD-10-CM | POA: Insufficient documentation

## 2020-12-09 DIAGNOSIS — F10939 Alcohol use, unspecified with withdrawal, unspecified: Secondary | ICD-10-CM

## 2020-12-09 DIAGNOSIS — F1010 Alcohol abuse, uncomplicated: Secondary | ICD-10-CM

## 2020-12-09 DIAGNOSIS — F1994 Other psychoactive substance use, unspecified with psychoactive substance-induced mood disorder: Secondary | ICD-10-CM | POA: Diagnosis present

## 2020-12-09 DIAGNOSIS — Z79899 Other long term (current) drug therapy: Secondary | ICD-10-CM | POA: Insufficient documentation

## 2020-12-09 DIAGNOSIS — F10239 Alcohol dependence with withdrawal, unspecified: Secondary | ICD-10-CM | POA: Diagnosis not present

## 2020-12-09 LAB — CBC WITH DIFFERENTIAL/PLATELET
Abs Immature Granulocytes: 0.14 10*3/uL — ABNORMAL HIGH (ref 0.00–0.07)
Basophils Absolute: 0 10*3/uL (ref 0.0–0.1)
Basophils Relative: 1 %
Eosinophils Absolute: 0 10*3/uL (ref 0.0–0.5)
Eosinophils Relative: 1 %
HCT: 40.6 % (ref 39.0–52.0)
Hemoglobin: 13 g/dL (ref 13.0–17.0)
Immature Granulocytes: 4 %
Lymphocytes Relative: 20 %
Lymphs Abs: 0.8 10*3/uL (ref 0.7–4.0)
MCH: 24.3 pg — ABNORMAL LOW (ref 26.0–34.0)
MCHC: 32 g/dL (ref 30.0–36.0)
MCV: 76 fL — ABNORMAL LOW (ref 80.0–100.0)
Monocytes Absolute: 0.3 10*3/uL (ref 0.1–1.0)
Monocytes Relative: 7 %
Neutro Abs: 2.6 10*3/uL (ref 1.7–7.7)
Neutrophils Relative %: 67 %
Platelets: 25 10*3/uL — CL (ref 150–400)
RBC: 5.34 MIL/uL (ref 4.22–5.81)
RDW: 15.9 % — ABNORMAL HIGH (ref 11.5–15.5)
WBC: 3.9 10*3/uL — ABNORMAL LOW (ref 4.0–10.5)
nRBC: 0 % (ref 0.0–0.2)

## 2020-12-09 LAB — COMPREHENSIVE METABOLIC PANEL
ALT: 104 U/L — ABNORMAL HIGH (ref 0–44)
AST: 138 U/L — ABNORMAL HIGH (ref 15–41)
Albumin: 4.1 g/dL (ref 3.5–5.0)
Alkaline Phosphatase: 91 U/L (ref 38–126)
Anion gap: 15 (ref 5–15)
BUN: <5 mg/dL — ABNORMAL LOW (ref 6–20)
CO2: 24 mmol/L (ref 22–32)
Calcium: 8.5 mg/dL — ABNORMAL LOW (ref 8.9–10.3)
Chloride: 98 mmol/L (ref 98–111)
Creatinine, Ser: 0.55 mg/dL — ABNORMAL LOW (ref 0.61–1.24)
GFR, Estimated: >60 mL/min (ref >60)
Glucose, Bld: 266 mg/dL — ABNORMAL HIGH (ref 70–99)
Potassium: 3.6 mmol/L (ref 3.5–5.1)
Sodium: 137 mmol/L (ref 135–145)
Total Bilirubin: 1.5 mg/dL — ABNORMAL HIGH (ref 0.3–1.2)
Total Protein: 7.2 g/dL (ref 6.5–8.1)

## 2020-12-09 LAB — HEMOGLOBIN A1C
Hgb A1c MFr Bld: 7.4 % — ABNORMAL HIGH (ref 4.8–5.6)
Mean Plasma Glucose: 165.68 mg/dL

## 2020-12-09 LAB — RESP PANEL BY RT-PCR (FLU A&B, COVID) ARPGX2
Influenza A by PCR: NEGATIVE
Influenza B by PCR: NEGATIVE
SARS Coronavirus 2 by RT PCR: NEGATIVE

## 2020-12-09 LAB — POC SARS CORONAVIRUS 2 AG -  ED: SARS Coronavirus 2 Ag: NEGATIVE

## 2020-12-09 LAB — LIPID PANEL
Cholesterol: 241 mg/dL — ABNORMAL HIGH (ref 0–200)
HDL: 100 mg/dL (ref >40)
LDL Cholesterol: 128 mg/dL — ABNORMAL HIGH (ref 0–99)
Total CHOL/HDL Ratio: 2.4 RATIO
Triglycerides: 64 mg/dL (ref <150)
VLDL: 13 mg/dL (ref 0–40)

## 2020-12-09 LAB — POCT URINE DRUG SCREEN - MANUAL ENTRY (I-SCREEN)
POC Amphetamine UR: NOT DETECTED
POC Buprenorphine (BUP): NOT DETECTED
POC Cocaine UR: NOT DETECTED
POC Marijuana UR: NOT DETECTED
POC Methadone UR: NOT DETECTED
POC Methamphetamine UR: NOT DETECTED
POC Morphine: NOT DETECTED
POC Oxazepam (BZO): POSITIVE — AB
POC Oxycodone UR: POSITIVE — AB
POC Secobarbital (BAR): NOT DETECTED

## 2020-12-09 LAB — ETHANOL: Alcohol, Ethyl (B): 388 mg/dL (ref <10)

## 2020-12-09 LAB — POC SARS CORONAVIRUS 2 AG: SARSCOV2ONAVIRUS 2 AG: NEGATIVE

## 2020-12-09 LAB — TSH: TSH: 1.427 u[IU]/mL (ref 0.350–4.500)

## 2020-12-09 MED ORDER — LORAZEPAM 1 MG PO TABS
1.0000 mg | ORAL_TABLET | Freq: Four times a day (QID) | ORAL | Status: AC
  Administered 2020-12-09 – 2020-12-10 (×4): 1 mg via ORAL
  Filled 2020-12-09 (×4): qty 1

## 2020-12-09 MED ORDER — LORAZEPAM 1 MG PO TABS
1.0000 mg | ORAL_TABLET | Freq: Two times a day (BID) | ORAL | Status: AC
  Administered 2020-12-11 – 2020-12-12 (×2): 1 mg via ORAL
  Filled 2020-12-09 (×2): qty 1

## 2020-12-09 MED ORDER — LORAZEPAM 1 MG PO TABS
1.0000 mg | ORAL_TABLET | Freq: Four times a day (QID) | ORAL | Status: AC | PRN
  Administered 2020-12-09: 1 mg via ORAL
  Filled 2020-12-09: qty 1

## 2020-12-09 MED ORDER — MAGNESIUM HYDROXIDE 400 MG/5ML PO SUSP
30.0000 mL | Freq: Every day | ORAL | Status: DC | PRN
Start: 2020-12-09 — End: 2020-12-13

## 2020-12-09 MED ORDER — LORAZEPAM 1 MG PO TABS
1.0000 mg | ORAL_TABLET | Freq: Three times a day (TID) | ORAL | Status: AC
  Administered 2020-12-10 – 2020-12-11 (×3): 1 mg via ORAL
  Filled 2020-12-09 (×3): qty 1

## 2020-12-09 MED ORDER — ADULT MULTIVITAMIN W/MINERALS CH
1.0000 | ORAL_TABLET | Freq: Every day | ORAL | Status: DC
Start: 2020-12-09 — End: 2020-12-13
  Administered 2020-12-09 – 2020-12-13 (×5): 1 via ORAL
  Filled 2020-12-09 (×2): qty 1
  Filled 2020-12-09: qty 7
  Filled 2020-12-09 (×3): qty 1

## 2020-12-09 MED ORDER — IBUPROFEN 400 MG PO TABS: 400.0000 mg | ORAL_TABLET | Freq: Four times a day (QID) | ORAL | Status: DC | PRN

## 2020-12-09 MED ORDER — ONDANSETRON 4 MG PO TBDP
4.0000 mg | ORAL_TABLET | Freq: Four times a day (QID) | ORAL | Status: AC | PRN
  Administered 2020-12-09: 4 mg via ORAL
  Filled 2020-12-09: qty 1

## 2020-12-09 MED ORDER — ALUM & MAG HYDROXIDE-SIMETH 200-200-20 MG/5ML PO SUSP: 30.0000 mL | ORAL | Status: DC | PRN

## 2020-12-09 MED ORDER — LOPERAMIDE HCL 2 MG PO CAPS: 2.0000 mg | ORAL_CAPSULE | ORAL | Status: AC | PRN

## 2020-12-09 MED ORDER — HYDROXYZINE HCL 25 MG PO TABS
25.0000 mg | ORAL_TABLET | Freq: Four times a day (QID) | ORAL | Status: AC | PRN
  Administered 2020-12-09 – 2020-12-11 (×5): 25 mg via ORAL
  Filled 2020-12-09 (×5): qty 1

## 2020-12-09 MED ORDER — ACETAMINOPHEN 325 MG PO TABS: 650.0000 mg | ORAL_TABLET | Freq: Four times a day (QID) | ORAL | Status: DC | PRN

## 2020-12-09 MED ORDER — LORAZEPAM 1 MG PO TABS
1.0000 mg | ORAL_TABLET | Freq: Every day | ORAL | Status: AC
  Administered 2020-12-13: 1 mg via ORAL
  Filled 2020-12-09: qty 1

## 2020-12-09 MED ORDER — THIAMINE HCL 100 MG PO TABS
100.0000 mg | ORAL_TABLET | Freq: Every day | ORAL | Status: DC
Start: 2020-12-09 — End: 2020-12-13
  Administered 2020-12-09 – 2020-12-13 (×5): 100 mg via ORAL
  Filled 2020-12-09 (×5): qty 1
  Filled 2020-12-09: qty 7

## 2020-12-09 NOTE — ED Notes (Signed)
Patient pain level 9 out of 10, aching all over, patient denies SI, "I just want to get better" patient stated

## 2020-12-09 NOTE — ED Notes (Signed)
Patient was sleeping at the beginning of the shift woke up at 2020 complaining of aching and feeling shaky inside. Patient was given Vistaril 25 mg. (PRN) . Patient is eating a snack in the day room.

## 2020-12-09 NOTE — ED Provider Notes (Signed)
[facility] Admission Suicide Risk Assessment   Nursing information obtained from:   chart  Demographic factors:   married, self employed, male  Current Mental Status:   depressed  Loss Factors:   alcohol abuse  Historical Factors:  family history of substance abuse  Risk Reduction Factors:   positive support , sobriety of 10 years   Total Time spent with patient: 45 minutes Principal Problem: <principal problem not specified> Diagnosis:  Active Problems:   Substance induced mood disorder ([place])  Subjective Data: Patient seen and examined face to face by this provider and chart reviewed. Patient was initially seen and evaluated at [facility] and transferred to the [facility] for substance abuse treatment. On evaluation, patient is alert and oriented x4. His thought process is logical, and speech is coherent. His mood is depressed, and affect is congruent. He denies suicidal ideations. He denies homicidal ideations. He denies auditory and visual hallucinations. He does not appear to be responding to internal or external stimuli. He reports feeling depressed for a while now and describes his symptoms as feeling embarrassed, ashamed, sad, hopeless, and worthless.     He reports needing help with alcohol abuse after staying sober for 10 years and relapsing 30 days ago. He states that he went to the store about 30 days ago initially to get a red bull but ended up getting a beer in memory of his friend that had recently died. He states that he thought he could have one drink but ended up getting drunk. He reports drinking every day all day for the past 30 days, on average 15 beers daily. He reports that his last drink was about 3 hours ago. He denies withdrawal symptoms at this time and no visible withdrawal symptoms observed. He states that he has tried to stop drinking about a week ago but started feeling sick and vomited. He denies using illicit drugs.    He states that he resides  with his wife. He has no prior mental health diagnosis. He is not prescribed medications for mental or medical problems.   Continued Clinical Symptoms:    The "Alcohol Use Disorders Identification Test", Guidelines for Use in Primary Care, Second Edition.  World Pharmacologist [facility]). Score between 0-7:  no or low risk or alcohol related problems. Score between 8-15:  moderate risk of alcohol related problems. Score between 16-19:  high risk of alcohol related problems. Score 20 or above:  warrants further diagnostic evaluation for alcohol dependence and treatment.   CLINICAL FACTORS:   Alcohol/Substance Abuse/Dependencies   Musculoskeletal: Strength & Muscle Tone: within normal limits Gait & Station: normal Patient leans: N/A  Psychiatric Specialty Exam:  Presentation  General Appearance: Appropriate for Environment  Eye Contact:Fair  Speech:Clear and Coherent  Speech Volume:Normal  Handedness: No data recorded  Mood and Affect  Mood:Depressed  Affect:Congruent   Thought Process  Thought Processes:Coherent; Goal Directed  Descriptions of Associations:Intact  Orientation:Full (Time, Place and Person)  Thought Content:Logical  History of Schizophrenia/Schizoaffective disorder:No  Duration of Psychotic Symptoms:No data recorded Hallucinations:Hallucinations: None  Ideas of Reference:None  Suicidal Thoughts:Suicidal Thoughts: No  Homicidal Thoughts:Homicidal Thoughts: No   Sensorium  Memory:Immediate Fair; Recent Fair; Remote Fair  Judgment:Fair  Insight:Fair   Executive Functions  Concentration:Fair  Attention Span:Fair  [place]   Psychomotor Activity  Psychomotor Activity:Psychomotor Activity: Normal   Assets  Assets:Communication Skills; Desire for Improvement; Financial Resources/Insurance; Housing; Leisure Time; Intimacy; Social Support; Physical Health  Sleep  Sleep:Sleep:  Poor Number of Hours of Sleep: 2    Physical Exam: Physical Exam HENT:     Head: Atraumatic.     Right Ear: External ear normal.     Left Ear: External ear normal.     Nose: Nose normal.  Eyes:     Conjunctiva/sclera: Conjunctivae normal.  Cardiovascular:     Rate and Rhythm: Normal rate.  Pulmonary:     Effort: Pulmonary effort is normal.  Musculoskeletal:        General: Normal range of motion.     Cervical back: Normal range of motion.  Neurological:     Mental Status: He is alert and oriented to person, place, and time.   Review of Systems  Constitutional: Negative.   HENT: Negative.    Eyes: Negative.   Respiratory: Negative.    Cardiovascular: Negative.   Gastrointestinal: Negative.   Genitourinary: Negative.   Musculoskeletal: Negative.   Skin: Negative.   Neurological: Negative.   Endo/Heme/Allergies: Negative.   Psychiatric/Behavioral:  Positive for depression and substance abuse.   There were no vitals taken for this visit. There is no height or weight on file to calculate BMI.   COGNITIVE FEATURES THAT CONTRIBUTE TO RISK:  None    SUICIDE RISK:   Minimal: No identifiable suicidal ideation.  Patients presenting with no risk factors but with morbid ruminations; may be classified as minimal risk based on the severity of the depressive symptoms  PLAN OF CARE: Patient admitted to [facility] for mood stabilization/alcohol detox.   I certify that inpatient services furnished can reasonably be expected to improve the patient's condition.   Marissa Calamity, NP 12/09/2020, 12:29 PM

## 2020-12-09 NOTE — ED Notes (Signed)
Snacks given 

## 2020-12-09 NOTE — H&P (Signed)
Behavioral Health Medical Screening Exam  [name] is an 50 y.o. male who presented to [facility] voluntarily as a walk-in with his wife for assessment of alcohol abuse and depression. Patient reports having 10 years of sobriety until drinking a beer a few months ago and "spiraling since"; he endores increased alcohol intake and depression. He denies any history of DT's or withdrawal seizures.   On assessment patient presents withdrawn, depressed. Alert and oriented. Mild fine tremor noted in fingers. Notes last drink 2 hours ago. He denies any active suicidal or homicidal ideations, auditory or visual hallucinations, and does not appear to be responding to any external/internal stimuli at this time. He states he is seeking treatment for his alcohol addiction and depression. Lives in [place].   Total Time spent with patient: 30 minutes  Psychiatric Specialty Exam: Physical Exam Vitals and nursing note reviewed.  Constitutional:      Appearance: He is not ill-appearing.  HENT:     Head: Normocephalic.     Nose: Nose normal.     Mouth/Throat:     Mouth: Mucous membranes are moist.     Pharynx: Oropharynx is clear.  Eyes:     Pupils: Pupils are equal, round, and reactive to light.  Cardiovascular:     Rate and Rhythm: Tachycardia present.     Pulses: Normal pulses.  Pulmonary:     Effort: Pulmonary effort is normal.  Musculoskeletal:        General: Normal range of motion.     Cervical back: Normal range of motion.  Skin:    General: Skin is warm.  Neurological:     Mental Status: He is alert and oriented to person, place, and time. Mental status is at baseline.     Motor: Tremor present. No weakness or seizure activity.     Coordination: Coordination normal. Finger-Nose-Finger Test normal.     Gait: Gait is intact.  Psychiatric:        Attention and Perception: Attention and perception normal. He is attentive. He does not perceive auditory or visual hallucinations.         Mood and Affect: Mood is depressed.        Speech: Speech normal.        Behavior: Behavior is cooperative.        Thought Content: Thought content is not paranoid or delusional. Thought content does not include homicidal or suicidal plan.        Cognition and Memory: Cognition normal.   Review of Systems  Psychiatric/Behavioral:  Positive for dysphoric mood.   All other systems reviewed and are negative. Blood pressure (!) 170/94, pulse (!) 106, temperature 98.3 F (36.8 C), temperature source Oral, resp. rate 18, SpO2 98 %.There is no height or weight on file to calculate BMI. General Appearance: Casual and Disheveled Eye Contact:  Fair Speech:  Normal Rate Volume:  Normal Mood:  Dysphoric and Hopeless Affect:  Congruent Thought Process:  Goal Directed Orientation:  Full (Time, Place, and Person) Thought Content:  Logical Suicidal Thoughts:  No Homicidal Thoughts:  No Memory:  Immediate;   Fair Recent;   Fair Judgement:  Fair Insight:  Fair Psychomotor Activity:  Normal Concentration: Concentration: Fair and Attention Span: Fair Recall:  [employer] of Knowledge:Fair Language: Fair Akathisia:  NA Handed:   AIMS (if indicated):    Assets:  Communication Skills Desire for Improvement Physical Health Resilience Social Support Sleep:     Musculoskeletal: Strength & Muscle Tone: within normal  limits Gait & Station: normal Patient leans: N/A  Blood pressure (!) 170/94, pulse (!) 106, temperature 98.3 F (36.8 C), temperature source Oral, resp. rate 18, SpO2 98 %.  Recommendations: Based on my evaluation the patient does not appear to have an emergency medical condition. Patient accepted to [facility] for admission.   Inda Merlin, NP 12/09/2020, 1:49 PM

## 2020-12-09 NOTE — ED Notes (Addendum)
Lab called [facility]) and reports critical results: Platelets at 25 K/uL. NP notified.

## 2020-12-09 NOTE — ED Provider Notes (Addendum)
[name] 50 year old male was admitted due to alcohol abuse.  Lab called critical platelet level 25.  NP consulted with emergency room provider Dr. Chyrl Civatte.  As was reported patient is asymptomatic.  Charted history with thrombocytopenia, acute pancreatitis, alcoholic hepatitis without ascites.  Patient denied that he has followed up with Hematology/Oncology in the past. Stated " my platelet has always been low."  MD advised to stop all NSAIDs, NP page internal medicine for consult with hematology.-Pending follow-up call.  -15:44 NP spoke to MD [facility], no active bleeding or signs of infection patient is okay to follow up outpatient.

## 2020-12-09 NOTE — ED Notes (Signed)
Lab [facility]) called for critical lab value: BAL 388. NP notified.

## 2020-12-09 NOTE — ED Provider Notes (Addendum)
Behavioral Health Admission H&P [facility] & OBS)  Date: 12/09/20 Patient Name: [name] MRN: [id number] Chief Complaint: alcohol abuse     Diagnoses:  Final diagnoses:  Alcohol abuse  Substance induced mood disorder [facility])    HPI: Patient seen and examined face to face by this provider and chart reviewed. Patient was initially seen and evaluated at [facility] and transferred to the [facility] for substance abuse treatment. On evaluation, patient is alert and oriented x4. His thought process is logical, and speech is coherent. His mood is depressed, and affect is congruent. He denies suicidal ideations. He denies homicidal ideations. He denies auditory and visual hallucinations. He does not appear to be responding to internal or external stimuli. He reports feeling depressed for a while now and describes his symptoms as feeling embarrassed, ashamed, sad, hopeless, and worthless.    He reports needing help with alcohol abuse after staying sober for 10 years and relapsing 30 days ago. He states that he went to the store about 30 days ago initially to get a red bull but ended up getting a beer in memory of his friend that had recently died. He states that he thought he could have one drink but ended up getting drunk. He reports drinking every day all day for the past 30 days, on average 15 beers daily. He reports that his last drink was about 3 hours ago. He denies withdrawal symptoms at this time and no visible withdrawal symptoms observed. He states that he has tried to stop drinking about a week ago but started feeling sick and vomited. He denies using illicit drugs.   He states that he resides with his wife. He has no prior mental health diagnosis. He is not prescribed medications for mental or medical problems.   PHQ 2-9:   Flowsheet Row OP Visit from 12/09/2020 in [place] No Risk        Total Time spent  with patient: 45 minutes  Musculoskeletal  Strength & Muscle Tone: within normal limits Gait & Station: normal Patient leans: N/A  Psychiatric Specialty Exam  Presentation General Appearance: Appropriate for Environment  Eye Contact:Fair  Speech:Clear and Coherent  Speech Volume:Normal  Handedness:No data recorded  Mood and Affect  Mood:Depressed  Affect:Congruent   Thought Process  Thought Processes:Coherent; Goal Directed  Descriptions of Associations:Intact  Orientation:Full (Time, Place and Person)  Thought Content:Logical  Diagnosis of Schizophrenia or Schizoaffective disorder in past: No   Hallucinations:Hallucinations: None  Ideas of Reference:None  Suicidal Thoughts:Suicidal Thoughts: No  Homicidal Thoughts:Homicidal Thoughts: No   Sensorium  Memory:Immediate Fair; Recent Fair; Remote Fair  Judgment:Fair  Insight:Fair   Executive Functions  Concentration:Fair  Attention Span:Fair  [place]   Psychomotor Activity  Psychomotor Activity:Psychomotor Activity: Normal   Assets  Assets:Communication Skills; Desire for Improvement; Financial Resources/Insurance; Housing; Leisure Time; Intimacy; Social Support; Physical Health   Sleep  Sleep:Sleep: Poor Number of Hours of Sleep: 2   Nutritional Assessment (For OBS and [facility] admissions only) Has the patient had a weight loss or gain of 10 pounds or more in the last 3 months?: No Has the patient had a decrease in food intake/or appetite?: No Does the patient have dental problems?: No Does the patient have eating habits or behaviors that may be indicators of an eating disorder including binging or inducing vomiting?: No Has the patient recently lost weight without trying?: 0 Has the patient  been eating poorly because of a decreased appetite?: 0 Malnutrition Screening Tool Score: 0   Physical Exam HENT:     Head: Normocephalic.     Nose: Nose  normal.  Eyes:     Conjunctiva/sclera: Conjunctivae normal.  Cardiovascular:     Rate and Rhythm: Normal rate.  Pulmonary:     Effort: Pulmonary effort is normal.  Musculoskeletal:     Cervical back: Normal range of motion.  Neurological:     Mental Status: He is alert and oriented to person, place, and time.   Review of Systems  Constitutional: Negative.   HENT: Negative.    Eyes: Negative.   Respiratory: Negative.    Cardiovascular: Negative.   Gastrointestinal: Negative.   Genitourinary: Negative.   Musculoskeletal: Negative.   Skin: Negative.   Neurological: Negative.   Endo/Heme/Allergies: Negative.   Psychiatric/Behavioral:  Positive for depression and substance abuse.    There were no vitals taken for this visit. There is no height or weight on file to calculate BMI.  Past Psychiatric History: alcohol abuse   Is the patient at risk to self? No  Has the patient been a risk to self in the past 6 months? No .    Has the patient been a risk to self within the distant past? No   Is the patient a risk to others? No   Has the patient been a risk to others in the past 6 months? No   Has the patient been a risk to others within the distant past? No   Past Medical History:  Past Medical History:  Diagnosis Date   Alcoholic ([place])    Anxiety    DVT (deep venous thrombosis) ([place])    Hypertension     Past Surgical History:  Procedure Laterality Date   BLOOD PATCH     TONSILLECTOMY      Family History: "No family hx of mental illness." Family History  Problem Relation Age of Onset   Hypertension Father     Social History:  Social History   Socioeconomic History   Marital status: Single    Spouse name: Not on file   Number of children: Not on file   Years of education: Not on file   Highest education level: Not on file  Occupational History   Not on file  Tobacco Use   Smoking status: Never   Smokeless tobacco: Not on file  Substance and Sexual Activity    Alcohol use: No    Alcohol/week: 12.0 standard drinks    Types: 12 Cans of beer per week   Drug use: No    Comment: marijuana in the past   Sexual activity: Not on file  Other Topics Concern   Not on file  Social History Narrative   Pt works odd Architect jobs and delivers furniture. Lives with fiancee in [place], [place].    Social Determinants of Health   Financial Resource Strain: Not on file  Food Insecurity: Not on file  Transportation Needs: Not on file  Physical Activity: Not on file  Stress: Not on file  Social Connections: Not on file  Intimate Partner Violence: Not on file    SDOH:  SDOH Screenings   Alcohol Screen: Not on file  Depression [id number]): Not on file  Financial Resource Strain: Not on file  Food Insecurity: Not on file  Housing: Not on file  Physical Activity: Not on file  Social Connections: Not on file  Stress: Not on file  Tobacco  Use: Unknown   Smoking Tobacco Use: Never   Smokeless Tobacco Use: Unknown  Transportation Needs: Not on file    Last Labs:  Admission on 12/09/2020  Component Date Value Ref Range Status   POC Amphetamine UR 12/09/2020 None Detected  NONE DETECTED (Cut Off Level 1000 ng/mL) Final   POC Secobarbital (BAR) 12/09/2020 None Detected  NONE DETECTED (Cut Off Level 300 ng/mL) Final   POC Buprenorphine (BUP) 12/09/2020 None Detected  NONE DETECTED (Cut Off Level 10 ng/mL) Final   POC Oxazepam (BZO) 12/09/2020 Positive (A) NONE DETECTED (Cut Off Level 300 ng/mL) Final   POC Cocaine UR 12/09/2020 None Detected  NONE DETECTED (Cut Off Level 300 ng/mL) Final   POC Methamphetamine UR 12/09/2020 None Detected  NONE DETECTED (Cut Off Level 1000 ng/mL) Final   POC Morphine 12/09/2020 None Detected  NONE DETECTED (Cut Off Level 300 ng/mL) Final   POC Oxycodone UR 12/09/2020 Positive (A) NONE DETECTED (Cut Off Level 100 ng/mL) Final   POC Methadone UR 12/09/2020 None Detected  NONE DETECTED (Cut Off Level 300 ng/mL) Final   POC  Marijuana UR 12/09/2020 None Detected  NONE DETECTED (Cut Off Level 50 ng/mL) Final   SARS Coronavirus 2 Ag 12/09/2020 Negative  Negative Final   SARSCOV2ONAVIRUS 2 AG 12/09/2020 NEGATIVE  NEGATIVE Final   Comment: (NOTE) SARS-CoV-2 antigen NOT DETECTED.   Negative results are presumptive.  Negative results do not preclude SARS-CoV-2 infection and should not be used as the sole basis for treatment or other patient management decisions, including infection  control decisions, particularly in the presence of clinical signs and  symptoms consistent with COVID-19, or in those who have been in contact with the virus.  Negative results must be combined with clinical observations, patient history, and epidemiological information. The expected result is Negative.  Fact Sheet for Patients: HandmadeRecipes.com.cy  Fact Sheet for Healthcare Providers: FuneralLife.at  This test is not yet approved or cleared by the [place] FDA and  has been authorized for detection and/or diagnosis of SARS-CoV-2 by FDA under an Emergency Use Authorization (EUA).  This EUA will remain in effect (meaning this test can be used) for the duration of  the COV                          ID-19 declaration under Section 564(b)(1) of the Act, 21 U.S.C. section 360bbb-3(b)(1), unless the authorization is terminated or revoked sooner.      Allergies: Patient has no known allergies.  PTA Medications: (Not in a hospital admission)   Medical Decision Making  Patient admitted to [facility] for mood stabilization.   Lab Orders         Resp Panel by RT-PCR (Flu A&B, Covid) Nasopharyngeal Swab         CBC with Differential/Platelet         Comprehensive metabolic panel         Ethanol         TSH         Lipid panel         Hemoglobin A1c         POCT Urine Drug Screen - ([facility])         POC SARS Coronavirus 2 Ag-ED - Nasal Swab         POC SARS  Coronavirus 2 Ag       Medications for alcohol detox:  LORazepam  1 mg Oral QID  Followed by   Derrill Memo ON 12/10/2020] LORazepam  1 mg Oral TID   Followed by   Derrill Memo ON 12/11/2020] LORazepam  1 mg Oral BID   Followed by   Derrill Memo ON 12/13/2020] LORazepam  1 mg Oral Daily   multivitamin with minerals  1 tablet Oral Daily   thiamine  100 mg Oral Daily   Ciwa scale ordered for alcohol detox.   Recommendations  Based on my evaluation the patient does not appear to have an emergency medical condition.  Marissa Calamity, NP 12/09/20  11:58 AM

## 2020-12-09 NOTE — ED Notes (Addendum)
50 yo male transfer from [facility] d/t ETOHabuse). ETOH level 338 on admission from [facility]. Denies SI/HI/AVH. Cooperative but anxious throughout interview process. Pt states, "I was sober for 30 yrs and went into a store about a month ago to get a beer. I thought I could handle it but apparently I was wrong. I've been drinking 12 beers a day every since. It's out of control and I need help. My wife has cancer and I need to be there for her. I just lost by best friend last month. He died unexpectantly". Pt reports body aches, tremors and hot/cold chills. CIWA 8. Pt request medication for w/d sx. Support and encouragement given. Provided pt written education on ETOH use and coping skills handout. Pt given 1800 scheduled Ativan for w/d sx. Oriented to unit. Meal and drink offered. Pt verbally contract for safety. Will monitor for safety.

## 2020-12-09 NOTE — ED Notes (Signed)
[facility] called to collect specimen and to transfer to [facility].

## 2020-12-09 NOTE — ED Notes (Signed)
Requested.

## 2020-12-09 NOTE — Progress Notes (Signed)
Lab called [facility]) and reports critical Platelets at 25 K/uL. NP notified.

## 2020-12-09 NOTE — ED Notes (Addendum)
Patient received his evening lorazepam at 2200.  He is resting in bed with eyes closed,.respirations are even and unlabored. Will continue to monitor.

## 2020-12-09 NOTE — ED Notes (Signed)
Pt is stating that he is shaking and currently has pain all over. The patient had a CIWA score of 8 at 1700. Per the patients chart he can only have vistaril at this time.

## 2020-12-09 NOTE — ED Notes (Signed)
Pts wife Lattie Haw called in and asked to speak to the patient. I advised Lattie Haw that I will have the patient to give her a call.

## 2020-12-09 NOTE — ED Notes (Signed)
Pt eating small amount of dinner and eating ice chips. Reports Ativan effective in "helping with this detox". Pt made a high risk for falls d/t detox score and starting Ativan. Received slip resistant socks, informed to keep room clutter free, reinforced the guidelines of written education on fall risk reviewed by staff and to notify staff with any assistance. Safety maintained.

## 2020-12-09 NOTE — ED Notes (Signed)
Pt oriented to unit given food and fluids.

## 2020-12-09 NOTE — BH Assessment (Addendum)
Comprehensive Clinical Assessment (CCA) Note  12/09/2020 [name] [id number]  Disposition: TTS completed. Per [facility] provider [name], NP), patient meets criteria for admission to the [facility] for alcohol detox and treatment of depressive mood. The [facility] provider Paul Angel, NP), accepts patient for admission to the [facility]. [place]-SUICIDE SEVERITY RATING SCALE (C-SSRS) completed and patient scored, "No Risk".   Flowsheet Row OP Visit from 12/09/2020 in [place] No Risk       The patient demonstrates the following risk factors for suicide: Chronic risk factors for suicide include: psychiatric disorder of Depressive Disorder, Severe  and substance use disorder. Acute risk factors for suicide include: loss (financial, interpersonal, professional) and relapsed on alcohol after 9 yrs of sobriety . Protective factors for this patient include: positive social support and responsibility to others (children, family). Considering these factors, the overall suicide risk at this point appears to be "No Risk". Patient is not appropriate for outpatient follow up.    Chief Complaint:  Chief Complaint  Patient presents with   Alcohol Problem   Depression   Visit Diagnosis: Depressive Disorder, Severe and Alcohol   [name] is a 50 yr old male that presents to [facility] as a walk-in. He is voluntary and present with his spouse whom he asked to remain present in today's assessment.   Patient has a complaint of alcohol abuse. States that after 9 yrs of sobriety from alcohol he drank #1 beer 30 days ago. Since drinking the #1 beer he has been unable to stop drinking alcohol. States, "I've been drinking all day everyday every since, none stop, up to #15 beers per day". He denies hx of seizures and/or withdrawal symptoms. However, states that is experiencing mild nausea and diarrhea.   Patient denies suicidal thoughts. Also, denies a  history of suicidal ideations. Denies a hx of self mutilating behaviors. Current depressive symptoms: loss of interest in unusual pleasures, tearful, hopelessness, lack of motivation to get out of bed, and accomplish daily task, etc. Sleeping no more than 5 hrs per day. Appetite is poor with no significant weight loss and/or gain.   Patient denies HI. Denies legal issues. Denies AVH's.No therapist and/or psychiatrist. He has received inpatient treatment at [facility] 9 yrs ago for detox. Also, residential treatment at [facility] for 28 days following his inpatient treatment at [facility], 9 yrs ago.   Patient is married and self employed. He has a family history of substance use. States that his brother died from a drug overdose yrs ago. Family is very supportive. No history of trauma and/or abuse.   Patient was overall calm and cooperative. Speech was normal. Affect was depressed and mood is congruent with affect. Insight and Impulse control are fair. Memory is recent and remote intact.  Patient does no appear to be responding to internal stimuli and/or experiencing an delusional thought processes.    CCA Screening, Triage and Referral (STR)  Patient Reported Information How did you hear about [place]? Family/Friend  What Is the Reason for Your Visit/Call Today? "I relapsed 30 days ago on alcohol and a I can't stop drinking". "I've been drinking everyday, all day, and I had my last beer an hour prior to arrival here today". "I am so embarrased, so ashamed, I've let my family down".  How Long Has This Been Causing You Problems? 1 wk - 1 month  What Do You Feel Would Help You the Most Today? Alcohol or Drug Use Treatment; Treatment  for Depression or other mood problem   Have You Recently Had Any Thoughts About Hurting Yourself? No  Are You Planning to Commit Suicide/Harm Yourself At This time? No   Have you Recently Had Thoughts About [place]? No  Are You Planning to Harm Someone at This Time?  No  Explanation: No data recorded  Have You Used Any Alcohol or Drugs in the Past 24 Hours? Yes  How Long Ago Did You Use Drugs or Alcohol? No data recorded What Did You Use and How Much? Patient relapsed 30 days ago after 9 yrs of sobriety. He reports drinking every day x30 days. Last drink was 1 hour prior to arrival and he reports drinking 1 beer.   Do You Currently Have a Therapist/Psychiatrist? No  Name of Therapist/Psychiatrist: No data recorded  Have You Been Recently Discharged From Any Office Practice or Programs? No  Explanation of Discharge From Practice/Program: No data recorded    CCA Screening Triage Referral Assessment Type of Contact: Face-to-Face  Telemedicine Service Delivery:   Is this Initial or Reassessment? Initial Assessment  Date Telepsych consult ordered in [facility]:  12/09/20  Time Telepsych consult ordered in [facility]:  No data recorded Location of Assessment: [facility]  Provider Location: [facility]   Collateral Involvement: No data recorded  Does Patient Have a [place]? No data recorded Name and Contact of Legal Guardian: No data recorded If Minor and Not Living with Parent(s), Who has Custody? No data recorded Is CPS involved or ever been involved? Never  Is APS involved or ever been involved? Never   Patient Determined To Be At Risk for Harm To Self or Others Based on Review of Patient Reported Information or Presenting Complaint? No  Method: No data recorded Availability of Means: No data recorded Intent: No data recorded Notification Required: No data recorded Additional Information for Danger to Others Potential: No data recorded Additional Comments for Danger to Others Potential: No data recorded Are There Guns or Other Weapons in Your Home? No data recorded Types of Guns/Weapons: No data recorded Are These Weapons Safely Secured?                            No data recorded Who Could  Verify You Are Able To Have These Secured: No data recorded Do You Have any Outstanding Charges, Pending Court Dates, Parole/Probation? No data recorded Contacted To Inform of Risk of Harm To Self or Others: No data recorded   Does Patient Present under Involuntary Commitment? No  IVC Papers Initial File Date: No data recorded  [place] of Residence: [place]   Patient Currently Receiving the Following Services: -- (Detox)   Determination of Need: Urgent (48 hours)   Options For Referral: Other: Comment (Detox)     CCA Biopsychosocial Patient Reported Schizophrenia/Schizoaffective Diagnosis in Past: No   Strengths: No data recorded  Mental Health Symptoms Depression:   Hopelessness; Difficulty Concentrating; Fatigue; Sleep (too much or little); Tearfulness; Weight gain/loss; Increase/decrease in appetite; Irritability; Change in energy/activity; Worthlessness   Duration of Depressive symptoms:  Duration of Depressive Symptoms: Greater than two weeks   Mania:   None   Anxiety:    Worrying; Tension; Fatigue; Difficulty concentrating   Psychosis:   None   Duration of Psychotic symptoms:    Trauma:   None   Obsessions:   None   Compulsions:   None   Inattention:   None  Hyperactivity/Impulsivity:   N/A   Oppositional/Defiant Behaviors:   N/A   Emotional Irregularity:   N/A   Other Mood/Personality Symptoms:  No data recorded   Mental Status Exam Appearance and self-care  Stature:   Average   Weight:   Average weight   Clothing:   Neat/clean   Grooming:   Normal   Cosmetic use:   None   Posture/gait:   Normal   Motor activity:   Not Remarkable   Sensorium  Attention:   Normal   Concentration:   Normal   Orientation:   Time; Situation; Place; Person; Object   Recall/memory:   Normal   Affect and Mood  Affect:   Appropriate   Mood:   Depressed   Relating  Eye contact:   Normal   Facial expression:    Depressed   Attitude toward examiner:   Cooperative   Thought and Language  Speech flow:  Clear and Coherent   Thought content:   Appropriate to Mood and Circumstances   Preoccupation:   None   Hallucinations:   None   Organization:  No data recorded  [employer] of Knowledge:   Fair   Intelligence:   Average   Abstraction:   Normal   Judgement:   Impaired   Reality Testing:   Adequate   Insight:  No data recorded  Decision Making:   Normal   Social Functioning  Social Maturity:   Isolates   Social Judgement:   Normal   Stress  Stressors:   Other (Comment); Work (relapsed; [employer]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, 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.)   Coping Ability:   Normal   Skill Deficits:  No data recorded  Supports:   Family     Religion: Religion/Spirituality Are You A Religious Person?: No  Leisure/Recreation: Leisure / Recreation Do You Have Hobbies?:  (unknown)  Exercise/Diet: Exercise/Diet Do You Exercise?:  (unknown) What Type of Exercise Do You Do?: Other (Comment) (unknown) How Many Times a Week Do You Exercise?:  (unknown) Have You Gained or Lost A Significant Amount of Weight in the Past Six Months?: No (unknown) Do You Follow a Special Diet?: No Do You Have Any Trouble Sleeping?: Yes Explanation of Sleeping Difficulties: no more than 5  hrs per night   CCA Employment/Education Employment/Work Situation: Employment / Work Situation Employment Situation: Employed Work Stressors: worries about not being able to work; Engineer, 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, worries about paying employees Patient's Job has Been Impacted by Current Illness: Yes Describe how Patient's Job has Been Impacted: states that he has been in the bed for 20 days; unable to find motivation to work Has Patient ever Been in the [employer]?: No  Education: Education Is Patient Currently Attending School?: No Last Grade  Completed:  (unknown) Did You Nutritional therapist?: No Did You Have An Individualized Education Program (IIEP): No Did You Have Any Difficulty At [employer]?: No Patient's Education Has Been Impacted by Current Illness: No   CCA Family/Childhood History Family and Relationship History: Family history Marital status: Married Number of Years Married:  (unknown) What types of issues is patient dealing with in the relationship?: unknown Additional relationship information: spouse is present during today's assessment; supportive of patient getting help Does patient have children?:  (unknown)  Childhood History:  Childhood History By whom was/is the patient raised?: Other (Comment) (unknown) Did patient suffer any verbal/emotional/physical/sexual abuse as a child?: No Did patient suffer from severe childhood neglect?:  No Has patient ever been sexually abused/assaulted/raped as an adolescent or adult?: No Was the patient ever a victim of a crime or a disaster?: No Witnessed domestic violence?: No Has patient been affected by domestic violence as an adult?: No  Child/Adolescent Assessment:     CCA Substance Use Alcohol/Drug Use: Alcohol / Drug Use Pain Medications: SEE MAR Prescriptions: SEE MAR Over the Counter: SEE MAR History of alcohol / drug use?: Yes Substance #1 Name of Substance 1: Alcohol 1 - Age of First Use: unknown 1 - Amount (size/oz): "I drink all day, 24 hrs around the clock, 12-15 beers daily" 1 - Frequency: daily 1 - Duration: 30 days daily; relapsed 30 days ago after 9 yrs of sobriety 1 - Last Use / Amount: 12/09/2020; 1 hr prior to arrival 1 - Method of Aquiring: purchase from store 1- Route of Use: oral                       ASAM's:  Six Dimensions of Multidimensional Assessment  Dimension 1:  Acute Intoxication and/or Withdrawal Potential:      Dimension 2:  Biomedical Conditions and Complications:      Dimension 3:  Emotional, Behavioral, or  Cognitive Conditions and Complications:     Dimension 4:  Readiness to Change:     Dimension 5:  Relapse, Continued use, or Continued Problem Potential:     Dimension 6:  Recovery/Living Environment:     ASAM Severity Score:    ASAM Recommended Level of Treatment:     Substance use Disorder (SUD) Substance Use Disorder (SUD)  Checklist Symptoms of Substance Use: Continued use despite having a persistent/recurrent physical/psychological problem caused/exacerbated by use, Continued use despite persistent or recurrent social, interpersonal problems, caused or exacerbated by use, Evidence of withdrawal (Comment), Evidence of tolerance, Large amounts of time spent to obtain, use or recover from the substance(s), Persistent desire or unsuccessful efforts to cut down or control use, Presence of craving or strong urge to use, Recurrent use that results in a failure to fulfill major role obligations (work, school, home), Repeated use in physically hazardous situations, Social, occupational, recreational activities given up or reduced due to use, Substance(s) often taken in larger amounts or over longer times than was intended  Recommendations for Services/Supports/Treatments: Recommendations for Services/Supports/Treatments Recommendations For Services/Supports/Treatments: Detox, SAIOP (Substance Abuse Intensive Outpatient Program), Residential-Level 1, CD-IOP Intensive Chemical Dependency Program  Discharge Disposition:    DSM5 Diagnoses: Patient Active Problem List   Diagnosis Date Noted   Hypokalemia 08/15/2012   Hypomagnesemia 08/15/2012   Other and unspecified hyperlipidemia 08/15/2012   Encephalopathy acute 08/09/2012   Alcohol withdrawal with delirium ([place]) 08/08/2012   Portal hypertension ([place]) 08/08/2012   Splenomegaly [id number]   Alcoholic hepatitis without ascites 08/08/2012   Hiatal hernia 08/08/2012   Alcohol abuse 08/06/2012   Elevated blood alcohol level 08/06/2012   Acute  pancreatitis 08/06/2012   Cirrhosis of liver ([place]) 08/06/2012   Thrombocytopenia ([place]) 08/06/2012   Hypertension    Anxiety    DVT (deep venous thrombosis) (HCC)      Referrals to Alternative Service(s): Referred to Alternative Service(s):   Place:   Date:   Time:    Referred to Alternative Service(s):   Place:   Date:   Time:    Referred to Alternative Service(s):   Place:   Date:   Time:    Referred to Alternative Service(s):   Place:   Date:   Time:  Waldon Merl, Counselor

## 2020-12-10 DIAGNOSIS — F1994 Other psychoactive substance use, unspecified with psychoactive substance-induced mood disorder: Secondary | ICD-10-CM | POA: Diagnosis not present

## 2020-12-10 DIAGNOSIS — Z20822 Contact with and (suspected) exposure to covid-19: Secondary | ICD-10-CM | POA: Diagnosis not present

## 2020-12-10 DIAGNOSIS — F10239 Alcohol dependence with withdrawal, unspecified: Secondary | ICD-10-CM | POA: Diagnosis not present

## 2020-12-10 DIAGNOSIS — Z79899 Other long term (current) drug therapy: Secondary | ICD-10-CM | POA: Diagnosis not present

## 2020-12-10 MED ORDER — LORAZEPAM 1 MG PO TABS
1.0000 mg | ORAL_TABLET | Freq: Once | ORAL | Status: AC
  Administered 2020-12-10: 1 mg via ORAL
  Filled 2020-12-10: qty 1

## 2020-12-10 NOTE — ED Notes (Signed)
Patient not able to sleep restless, Sweating and trembling. I spoke with [place] PA about patient. He was given a one time dose of Ativan. Will recheck vital in 30 minutes.

## 2020-12-10 NOTE — ED Notes (Signed)
Pt declined to participate in [facility] recreation group.

## 2020-12-10 NOTE — ED Provider Notes (Signed)
Behavioral Health Progress Note  Date and Time: 12/10/2020 3:07 PM Name: [name] MRN:  [id number]  Subjective:    50 yo male with AUD who relapsed ~30 days ago after beign sober for ~10 years. BAL 388 on 9/18 @ 1140 AM. Ativan taper stated with CIWA protocol.   Most recent CIWA 0 at 37 AM; prior to that was 8 at 4:36 AM.     Patient interviewed in his room this AM, he is laying in bed in NAD. He reports feeling "not good" and generally feels weak which he attributes to not eating very much the last couple days. He states that he was able to ear breakfast this AM with out issue. On discussing hospital presentation,  Pt states that he had been sober from alcohol  for ~10 years up until about 30 days ago when he relapsed. He describes experiencing several stressors recently, including his wife having colon cancer, multiple loved ones passing away. He states that he went to a gas station about 30 days ago and attempting to purchase some energy drinks. He states that there were no energy drinks available and he thought about a friend who used to drink miller lite who passed recently and thought "I would drink one for him". He states that at first he only consumed 1 beer at that time; however, his alcohol consumption has escalated over the last 30 days to the point where he was consuming 15 beers a day.  He reports consuming his last drink right before coming into the hospital. He denies alcohol withdrawal sx of palpitations, GI Upset, headache but reports anxiety, mild tremors, and diaphoresis. He denies h/o withdrawal seizures but states he went into Delirium tremens before about ~10 years ago. Upon further questioning it is unclear if he truly experienced Dts. Discussed with patient that he is currently on an ativan taper for control of withdrawal symptoms which should end 9/22 based on current schedule. Patient verbalized understanding. Discussed outpatient follow up; patient declines inpatient  rehab as he would like to be present at home to help care for his wife although is open to receiving information on outpatient substance use treatment. Discussed low platelets and that heme onc referral will be made upon hospital discharge as well.  Patient denies SI/HI/AVH     Diagnosis:  Final diagnoses:  Alcohol abuse  Substance induced mood disorder ([place])    Total Time spent with patient: 20 minutes  Past Psychiatric History: see H&P Past Medical History:  Past Medical History:  Diagnosis Date   Alcoholic ([place])    Anxiety    DVT (deep venous thrombosis) ([place])    Hypertension     Past Surgical History:  Procedure Laterality Date   BLOOD PATCH     TONSILLECTOMY     Family History:  Family History  Problem Relation Age of Onset   Hypertension Father    Family Psychiatric  History: see H&P Social History:  Social History   Substance and Sexual Activity  Alcohol Use No   Alcohol/week: 12.0 standard drinks   Types: 12 Cans of beer per week     Social History   Substance and Sexual Activity  Drug Use No   Comment: marijuana in the past    Social History   Socioeconomic History   Marital status: Single    Spouse name: Not on file   Number of children: Not on file   Years of education: Not on file   Highest education level:  Not on file  Occupational History   Not on file  Tobacco Use   Smoking status: Never   Smokeless tobacco: Not on file  Substance and Sexual Activity   Alcohol use: No    Alcohol/week: 12.0 standard drinks    Types: 12 Cans of beer per week   Drug use: No    Comment: marijuana in the past   Sexual activity: Not on file  Other Topics Concern   Not on file  Social History Narrative   Pt works odd Architect jobs and delivers furniture. Lives with fiancee in [place], [place].    Social Determinants of Health   Financial Resource Strain: Not on file  Food Insecurity: Not on file  Transportation Needs: Not on file  Physical  Activity: Not on file  Stress: Not on file  Social Connections: Not on file   SDOH:  SDOH Screenings   Alcohol Screen: Medium Risk   Last Alcohol Screening Score (AUDIT): 32  Depression (PHQ2-9): Not on file  Financial Resource Strain: Not on file  Food Insecurity: Not on file  Housing: Not on file  Physical Activity: Not on file  Social Connections: Not on file  Stress: Not on file  Tobacco Use: Unknown   Smoking Tobacco Use: Never   Smokeless Tobacco Use: Unknown  Transportation Needs: Not on file   Additional Social History:                         Sleep: Fair  Appetite:   improving  Current Medications:  Current Facility-Administered Medications  Medication Dose Route Frequency Provider Last Rate Last Admin   alum & mag hydroxide-simeth (MAALOX/MYLANTA) 200-200-20 MG/5ML suspension 30 mL  30 mL Oral Q4H PRN White, Patrice L, NP       hydrOXYzine (ATARAX/VISTARIL) tablet 25 mg  25 mg Oral Q6H PRN White, Patrice L, NP   25 mg at 12/10/20 0305   loperamide (IMODIUM) capsule 2-4 mg  2-4 mg Oral PRN White, Patrice L, NP       LORazepam (ATIVAN) tablet 1 mg  1 mg Oral Q6H PRN White, Patrice L, NP   1 mg at 12/09/20 1225   LORazepam (ATIVAN) tablet 1 mg  1 mg Oral TID White, Patrice L, NP       Followed by   Derrill Memo ON 12/11/2020] LORazepam (ATIVAN) tablet 1 mg  1 mg Oral BID White, Patrice L, NP       Followed by   Derrill Memo ON 12/13/2020] LORazepam (ATIVAN) tablet 1 mg  1 mg Oral Daily White, Patrice L, NP       magnesium hydroxide (MILK OF MAGNESIA) suspension 30 mL  30 mL Oral Daily PRN White, Patrice L, NP       multivitamin with minerals tablet 1 tablet  1 tablet Oral Daily White, Patrice L, NP   1 tablet at 12/10/20 0936   ondansetron (ZOFRAN-ODT) disintegrating tablet 4 mg  4 mg Oral Q6H PRN White, Patrice L, NP   4 mg at 12/09/20 1224   thiamine tablet 100 mg  100 mg Oral Daily White, Patrice L, NP   100 mg at 12/10/20 9323   Current Outpatient Medications   Medication Sig Dispense Refill   acetaminophen (TYLENOL) 325 MG tablet Take 325 mg by mouth every 6 (six) hours as needed (pain/headache).      Labs  Lab Results:  Admission on 12/09/2020  Component Date Value Ref Range Status   SARS Coronavirus  2 by RT PCR 12/09/2020 NEGATIVE  NEGATIVE Final   Comment: (NOTE) SARS-CoV-2 target nucleic acids are NOT DETECTED.  The SARS-CoV-2 RNA is generally detectable in upper respiratory specimens during the acute phase of infection. The lowest concentration of SARS-CoV-2 viral copies this assay can detect is 138 copies/mL. A negative result does not preclude SARS-Cov-2 infection and should not be used as the sole basis for treatment or other patient management decisions. A negative result may occur with  improper specimen collection/handling, submission of specimen other than nasopharyngeal swab, presence of viral mutation(s) within the areas targeted by this assay, and inadequate number of viral copies(<138 copies/mL). A negative result must be combined with clinical observations, patient history, and epidemiological information. The expected result is Negative.  Fact Sheet for Patients:  EntrepreneurPulse.com.au  Fact Sheet for Healthcare Providers:  IncredibleEmployment.be  This test is no                          t yet approved or cleared by the [place] FDA and  has been authorized for detection and/or diagnosis of SARS-CoV-2 by FDA under an Emergency Use Authorization (EUA). This EUA will remain  in effect (meaning this test can be used) for the duration of the COVID-19 declaration under Section 564(b)(1) of the Act, 21 U.S.C.section 360bbb-3(b)(1), unless the authorization is terminated  or revoked sooner.       Influenza A by PCR 12/09/2020 NEGATIVE  NEGATIVE Final   Influenza B by PCR 12/09/2020 NEGATIVE  NEGATIVE Final   Comment: (NOTE) The Xpert Xpress SARS-CoV-2/FLU/RSV plus assay  is intended as an aid in the diagnosis of influenza from Nasopharyngeal swab specimens and should not be used as a sole basis for treatment. Nasal washings and aspirates are unacceptable for Xpert Xpress SARS-CoV-2/FLU/RSV testing.  Fact Sheet for Patients: EntrepreneurPulse.com.au  Fact Sheet for Healthcare Providers: IncredibleEmployment.be  This test is not yet approved or cleared by the [place] FDA and has been authorized for detection and/or diagnosis of SARS-CoV-2 by FDA under an Emergency Use Authorization (EUA). This EUA will remain in effect (meaning this test can be used) for the duration of the COVID-19 declaration under Section 564(b)(1) of the Act, 21 U.S.C. section 360bbb-3(b)(1), unless the authorization is terminated or revoked.  Performed at [facility], [place] [address] [place], [place] [address]    WBC 12/09/2020 3.9 (A) 4.0 - 10.5 K/uL Final   RBC 12/09/2020 5.34  4.22 - 5.81 MIL/uL Final   Hemoglobin 12/09/2020 13.0  13.0 - 17.0 g/dL Final   HCT 12/09/2020 40.6  39.0 - 52.0 % Final   MCV 12/09/2020 76.0 (A) 80.0 - 100.0 fL Final   MCH 12/09/2020 24.3 (A) 26.0 - 34.0 pg Final   MCHC 12/09/2020 32.0  30.0 - 36.0 g/dL Final   RDW 12/09/2020 15.9 (A) 11.5 - 15.5 % Final   Platelets 12/09/2020 25 (A) 150 - 400 K/uL Final   Comment: Immature Platelet Fraction may be clinically indicated, consider ordering this additional test [id number] REPEATED TO VERIFY PLATELET COUNT CONFIRMED BY SMEAR THIS CRITICAL RESULT HAS VERIFIED AND BEEN CALLED TO ANTHANY ADAMS RN BY KYUNG BAEK ON 09 18 2022 AT 71, AND HAS BEEN READ BACK.     nRBC 12/09/2020 0.0  0.0 - 0.2 % Final   Neutrophils Relative % 12/09/2020 67  % Final   Neutro Abs 12/09/2020 2.6  1.7 - 7.7 K/uL Final   Lymphocytes Relative 12/09/2020 20  %  Final   Lymphs Abs 12/09/2020 0.8  0.7 - 4.0 K/uL Final   Monocytes Relative 12/09/2020 7  % Final   Monocytes Absolute  12/09/2020 0.3  0.1 - 1.0 K/uL Final   Eosinophils Relative 12/09/2020 1  % Final   Eosinophils Absolute 12/09/2020 0.0  0.0 - 0.5 K/uL Final   Basophils Relative 12/09/2020 1  % Final   Basophils Absolute 12/09/2020 0.0  0.0 - 0.1 K/uL Final   Immature Granulocytes 12/09/2020 4  % Final   Abs Immature Granulocytes 12/09/2020 0.14 (A) 0.00 - 0.07 K/uL Final   Performed at [facility], [place] [address], [place], [place] [address]   Sodium 12/09/2020 137  135 - 145 mmol/L Final   Potassium 12/09/2020 3.6  3.5 - 5.1 mmol/L Final   Chloride 12/09/2020 98  98 - 111 mmol/L Final   CO2 12/09/2020 24  22 - 32 mmol/L Final   Glucose, Bld 12/09/2020 266 (A) 70 - 99 mg/dL Final   Glucose reference range applies only to samples taken after fasting for at least 8 hours.   BUN 12/09/2020 <5 (A) 6 - 20 mg/dL Final   Creatinine, Ser 12/09/2020 0.55 (A) 0.61 - 1.24 mg/dL Final   Calcium 12/09/2020 8.5 (A) 8.9 - 10.3 mg/dL Final   Total Protein 12/09/2020 7.2  6.5 - 8.1 g/dL Final   Albumin 12/09/2020 4.1  3.5 - 5.0 g/dL Final   AST 12/09/2020 138 (A) 15 - 41 U/L Final   ALT 12/09/2020 104 (A) 0 - 44 U/L Final   Alkaline Phosphatase 12/09/2020 91  38 - 126 U/L Final   Total Bilirubin 12/09/2020 1.5 (A) 0.3 - 1.2 mg/dL Final   GFR, Estimated 12/09/2020 >60  >60 mL/min Final   Comment: (NOTE) Calculated using the CKD-EPI Creatinine Equation (2021)    Anion gap 12/09/2020 15  5 - 15 Final   Performed at [facility] [address], [place], [place] [address]   Alcohol, Ethyl (B) 12/09/2020 388 (A) <10 mg/dL Final   Comment: CRITICAL RESULT CALLED TO, READ BACK BY AND VERIFIED WITH: A ADAMS RN BY SSTEPHENS [id number] [id number] (NOTE) Lowest detectable limit for serum alcohol is 10 mg/dL.  For medical purposes only. Performed at [facility], [place] [address] [place], [place] [address]    TSH 12/09/2020 1.427  0.350 - 4.500 uIU/mL Final   Comment: Performed by a 3rd Generation assay with a  functional sensitivity of <=0.01 uIU/mL. Performed at [facility], [place] [address] [place], [place] [address]    Cholesterol 12/09/2020 241 (A) 0 - 200 mg/dL Final   Triglycerides 12/09/2020 64  <150 mg/dL Final   HDL 12/09/2020 100  >40 mg/dL Final   Total CHOL/HDL Ratio 12/09/2020 2.4  RATIO Final   VLDL 12/09/2020 13  0 - 40 mg/dL Final   LDL Cholesterol 12/09/2020 128 (A) 0 - 99 mg/dL Final   Comment:        Total Cholesterol/HDL:CHD Risk Coronary Heart Disease Risk Table                     Men   Women  1/2 Average Risk   3.4   3.3  Average Risk       5.0   4.4  2 X Average Risk   9.6   7.1  3 X Average Risk  23.4   11.0        Use the calculated Patient Ratio  above and the CHD Risk Table to determine the patient's CHD Risk.        ATP III CLASSIFICATION (LDL):  <100     mg/dL   Optimal  100-129  mg/dL   Near or Above                    Optimal  130-159  mg/dL   Borderline  160-189  mg/dL   High  >190     mg/dL   Very High Performed at [facility] [address], [place], [place] [address]    Hgb A1c MFr Bld 12/09/2020 7.4 (A) 4.8 - 5.6 % Final   Comment: (NOTE) Pre diabetes:          5.7%-6.4%  Diabetes:              >6.4%  Glycemic control for   <7.0% adults with diabetes    Mean Plasma Glucose 12/09/2020 165.68  mg/dL Final   Performed at [facility], [place] [address], [place], [place] [address]   POC Amphetamine UR 12/09/2020 None Detected  NONE DETECTED (Cut Off Level 1000 ng/mL) Final   POC Secobarbital (BAR) 12/09/2020 None Detected  NONE DETECTED (Cut Off Level 300 ng/mL) Final   POC Buprenorphine (BUP) 12/09/2020 None Detected  NONE DETECTED (Cut Off Level 10 ng/mL) Final   POC Oxazepam (BZO) 12/09/2020 Positive (A) NONE DETECTED (Cut Off Level 300 ng/mL) Final   POC Cocaine UR 12/09/2020 None Detected  NONE DETECTED (Cut Off Level 300 ng/mL) Final   POC Methamphetamine UR 12/09/2020 None Detected  NONE DETECTED (Cut Off Level 1000  ng/mL) Final   POC Morphine 12/09/2020 None Detected  NONE DETECTED (Cut Off Level 300 ng/mL) Final   POC Oxycodone UR 12/09/2020 Positive (A) NONE DETECTED (Cut Off Level 100 ng/mL) Final   POC Methadone UR 12/09/2020 None Detected  NONE DETECTED (Cut Off Level 300 ng/mL) Final   POC Marijuana UR 12/09/2020 None Detected  NONE DETECTED (Cut Off Level 50 ng/mL) Final   SARS Coronavirus 2 Ag 12/09/2020 Negative  Negative Final   SARSCOV2ONAVIRUS 2 AG 12/09/2020 NEGATIVE  NEGATIVE Final   Comment: (NOTE) SARS-CoV-2 antigen NOT DETECTED.   Negative results are presumptive.  Negative results do not preclude SARS-CoV-2 infection and should not be used as the sole basis for treatment or other patient management decisions, including infection  control decisions, particularly in the presence of clinical signs and  symptoms consistent with COVID-19, or in those who have been in contact with the virus.  Negative results must be combined with clinical observations, patient history, and epidemiological information. The expected result is Negative.  Fact Sheet for Patients: HandmadeRecipes.com.cy  Fact Sheet for Healthcare Providers: FuneralLife.at  This test is not yet approved or cleared by the [place] FDA and  has been authorized for detection and/or diagnosis of SARS-CoV-2 by FDA under an Emergency Use Authorization (EUA).  This EUA will remain in effect (meaning this test can be used) for the duration of  the COV                          ID-19 declaration under Section 564(b)(1) of the Act, 21 U.S.C. section 360bbb-3(b)(1), unless the authorization is terminated or revoked sooner.      Blood Alcohol level:  Lab Results  Component Value Date   ETH 388 (HH) 12/09/2020   ETH 198 (H) 76/16/0737    Metabolic  Disorder Labs: Lab Results  Component Value Date   HGBA1C 7.4 (H) 12/09/2020   MPG 165.68 12/09/2020   No results found  for: PROLACTIN Lab Results  Component Value Date   CHOL 241 (H) 12/09/2020   TRIG 64 12/09/2020   HDL 100 12/09/2020   CHOLHDL 2.4 12/09/2020   VLDL 13 12/09/2020   LDLCALC 128 (H) 12/09/2020   LDLCALC 187 (H) 08/06/2012    Therapeutic Lab Levels: No results found for: LITHIUM No results found for: VALPROATE No components found for:  CBMZ  Physical Findings   AUDIT    Flowsheet Row ED from 12/09/2020 in [facility]  Alcohol Use Disorder Identification Test Final Score (AUDIT) 32      PHQ2-9    [place] Office Visit from 11/22/2014 in [place]  PHQ-2 Total Score 1      [place] ED from 12/09/2020 in [facility] Most recent reading at 12/09/2020  5:25 PM OP Visit from 12/09/2020 in [place] Most recent reading at 12/09/2020 10:40 AM  C-SSRS RISK CATEGORY No Risk No Risk        Musculoskeletal  Strength & Muscle Tone:  did not assess; patient laying in bed Gait & Station:  did not assess; patient laying in bed Patient leans:  did not assess; patient laying in bed  Psychiatric Specialty Exam  Presentation  General Appearance: Appropriate for Environment; Casual  Eye Contact:Fair  Speech:Clear and Coherent; Normal Rate  Speech Volume:Normal  Handedness: No data recorded  Mood and Affect  Mood:Dysphoric  Affect:Appropriate; Congruent   Thought Process  Thought Processes:Coherent; Goal Directed; Linear  Descriptions of Associations:Intact  Orientation:Full (Time, Place and Person)  Thought Content:WDL; Logical  Diagnosis of Schizophrenia or Schizoaffective disorder in past: No    Hallucinations:Hallucinations: None  Ideas of Reference:None  Suicidal Thoughts:Suicidal Thoughts: No  Homicidal Thoughts:Homicidal Thoughts: No   Sensorium  Memory:Immediate Good; Recent Good; Remote  Good  Judgment:Fair  Insight:Fair   Executive Functions  Concentration:Good  Attention Span:Good  [place] of Knowledge:Good  Language:Good   Psychomotor Activity  Psychomotor Activity:Psychomotor Activity: Tremor (mild tremor in BUE with outstreched arms)   Assets  Assets:Communication Skills; Desire for Improvement; Resilience; Social Support; Vocational/Educational   Sleep  Sleep:Sleep: Fair Number of Hours of Sleep: 2   Nutritional Assessment (For OBS and FBC admissions only) Has the patient had a weight loss or gain of 10 pounds or more in the last 3 months?: No Has the patient had a decrease in food intake/or appetite?: No Does the patient have dental problems?: No Does the patient have eating habits or behaviors that may be indicators of an eating disorder including binging or inducing vomiting?: No Has the patient recently lost weight without trying?: 0 Has the patient been eating poorly because of a decreased appetite?: 0 Malnutrition Screening Tool Score: 0   Physical Exam  Physical Exam Constitutional:      Appearance: Normal appearance. He is normal weight.  HENT:     Head: Normocephalic and atraumatic.  Eyes:     Extraocular Movements: Extraocular movements intact.  Pulmonary:     Effort: Pulmonary effort is normal.  Neurological:     General: No focal deficit present.     Mental Status: He is alert and oriented to person, place, and time.     Comments: Mild BUE tremors with outstretched arms  Psychiatric:  Attention and Perception: Attention and perception normal.        Speech: Speech normal.        Behavior: Behavior normal. Behavior is cooperative.        Thought Content: Thought content normal.   Review of Systems  Constitutional:  Positive for diaphoresis and malaise/fatigue. Negative for chills and fever.  HENT:  Negative for hearing loss.   Eyes:  Negative for discharge and redness.  Respiratory:  Negative for  cough.   Cardiovascular:  Negative for chest pain.  Gastrointestinal:  Negative for abdominal pain and nausea.  Neurological:  Positive for tremors. Negative for headaches.  Psychiatric/Behavioral:  Positive for substance abuse. Negative for depression, hallucinations and suicidal ideas.   Blood pressure (!) 169/97, pulse 87, temperature 99 F (37.2 C), temperature source Oral, resp. rate 18, SpO2 99 %. There is no height or weight on file to calculate BMI.  Treatment Plan Summary:   50 yo male with AUD who relapsed ~30 days ago after beign sober for ~10 years. BAL 388 on 9/18 @ 1140 AM. Ativan taper started with CIWA protocol on admission.   Most recent CIWA 0 at 10 AM; prior to that was 8 at 4:36 AM. Patient denies SI/HI/AVH although continues to display ssx c/w with alcohol withdrawal. Patient is appropriate for continued admission to the [facility] for alcohol detox/further monitoring.  Alcohol use disorder -continue ativan taper previously ordered:1 mg QID followed by 1 mg TID followed by 1 mg BID followed by 1 mg QAM -continue CIWA protocol; most recent CIWA 0 -patient declines residential rehab at this time but is open to outpatient treatment  Thrombocytopenia -platets 25 on routine lab work -On 9/18 NP called ED MD who recommended to stop all NSAIDS. NP also spoke with spoke with Internal medicine; recommended that patient can follow up out patient as no active bleeding or signs of infection -patient will need heme-onc referral on discharge  Dispo: Ongoing. likely home after completion of ativan taper on 9/22     Ival Bible, MD 12/10/2020 3:07 PM

## 2020-12-10 NOTE — Clinical Social Work Psych Note (Signed)
CSW Note  CSW met with patient for introduction and to begin discussions regarding potential discharge planning.   [name] reports that he has struggled with alcohol use for the last month, after relapsing on one beer while visiting a gas station. [name] states that prior to his relapse, he was sober for 10 years.   [name] endorsed drinking up to 15 beers a day. He reports he felt guilty and shamed for relapsing. He shared that his wife is currently battling cancer and he has lost multiple friends in a short period of time. [name] denied that grief was a contributor to his alcohol use.   [name] reports he does not want to go or be referred to any residential treatment facility for alcohol use at this time. He states that he wants to safely detox and "get back to work".   [name] was agreeable for CSW to provide resources for outpatient medication management and therapy services.   [name] plans to return home with his spouse at discharge.   CSW will continue to follow.    Radonna Ricker, MSW, LCSW Clinical Education officer, museum ([place]) [facility]

## 2020-12-10 NOTE — ED Notes (Signed)
Patient is sleeping in bed. No signs of  respiratory distress noted. Will continue to monitor for safety.

## 2020-12-10 NOTE — ED Notes (Signed)
Pateint received 1 mg Ativan for agitation/restlessness, Patient is resting at this time. States he still has pain of 7 out of 10. States feels " a lot better" after the Ativan. Will continue to monitor.

## 2020-12-10 NOTE — ED Notes (Signed)
Patient is in the bed sleeping. No signs of respiratory distress noted. Will continue to monitor for safety.

## 2020-12-10 NOTE — ED Notes (Signed)
Patient states he is "stressed" his mother's birthday was today and she jut found out about him being admitted. Denies SI, HI and AVH.  BP high - prns for anxiety given.  Will take again about midnight (provider aware) will continue to monitor for safety

## 2020-12-11 DIAGNOSIS — F10239 Alcohol dependence with withdrawal, unspecified: Secondary | ICD-10-CM | POA: Diagnosis not present

## 2020-12-11 DIAGNOSIS — F1994 Other psychoactive substance use, unspecified with psychoactive substance-induced mood disorder: Secondary | ICD-10-CM | POA: Diagnosis not present

## 2020-12-11 DIAGNOSIS — Z79899 Other long term (current) drug therapy: Secondary | ICD-10-CM | POA: Diagnosis not present

## 2020-12-11 DIAGNOSIS — Z20822 Contact with and (suspected) exposure to covid-19: Secondary | ICD-10-CM | POA: Diagnosis not present

## 2020-12-11 MED ORDER — LORAZEPAM 1 MG PO TABS
1.0000 mg | ORAL_TABLET | Freq: Once | ORAL | Status: AC
  Administered 2020-12-11: 1 mg via ORAL
  Filled 2020-12-11: qty 1

## 2020-12-11 MED ORDER — TRAZODONE HCL 50 MG PO TABS
50.0000 mg | ORAL_TABLET | Freq: Once | ORAL | Status: AC
  Administered 2020-12-11: 50 mg via ORAL
  Filled 2020-12-11: qty 1

## 2020-12-11 MED ORDER — TRAZODONE HCL 100 MG PO TABS
100.0000 mg | ORAL_TABLET | Freq: Every day | ORAL | Status: DC
  Administered 2020-12-11 – 2020-12-12 (×2): 100 mg via ORAL
  Filled 2020-12-11: qty 1
  Filled 2020-12-11: qty 7
  Filled 2020-12-11: qty 1

## 2020-12-11 NOTE — ED Notes (Signed)
Pt got up not feeling his best but has tried and pushed himself to attend the morning and noon day group time. He did express himself and chimed in on the groups discussion .

## 2020-12-11 NOTE — Progress Notes (Addendum)
Received [name] this AM asleep in his bed, he woke up and received his medication. He continued to endorsed feeling anxious, shaky and unable to sleep. He showered with the hope it will help him to sleep. He verbalized feeling better related to the withdrawal symptoms. He denied a past history of Hypertension.

## 2020-12-11 NOTE — ED Notes (Signed)
Patient with difficulty sleeping.  Provider ordered sleep med- given. Will continue to monitor for safety

## 2020-12-11 NOTE — ED Notes (Addendum)
Pt up to nurse's station stating, "my muscles in my left leg are trembling, it woke me up and I didn't know if it's something I should be worried about." Pt reports it feels like a "muscle cramp." CIWA 2. Pt requests Gatorade and mustard. Cranberry juice (no Gatorade available) and 2 mustard packets provided per pt's request. Pt denies need for PRN medication at this time. No signs of acute distress noted. Lindon Romp, NP made aware of pt's symptoms and vital signs. Will continue to monitor for safety.

## 2020-12-11 NOTE — ED Notes (Signed)
Patient resting with no sxs of distress.  Will continue to monitor for safety

## 2020-12-11 NOTE — Progress Notes (Signed)
Behavioral Health Progress Note  Date and Time: 12/11/2020 4:11 PM Name: [name] MRN:  [id number]  Subjective:    Original note by student Doctor Nelva Nay Ms3; edited by me as appropriate for completeness and accuracy.    CIWA 6 @ [id number]. CIWA 3 @ 11:16.  Patient seen by me this afternoon, he describes his mood as " a little bit better". He states that he overall feels better after he took a shower this AM. Objectively, he appears improved in regards to withdrawal symptoms. Patient has been out of bed, attending groups and interacting with other patients appropriately in the common area. He denies withdrawal symptoms of headache, palpitations, anxiety, GI upset, diaphoresis although reports mild tremor. He estates that he slept poorly last night and feels that he wakes up every 15 minutes. He denies SI/HI/AVH and expresses that he is hopeful that he will be able to discharge on completion of ativan taper on Thursday as he would like to acompany his wife to a medical appointment. On chart review, he received trazodone 50 mg for sleep although patient descrivves it as ineffective. Discussed increasing trazodone which patient was in agreement with.  This afternoon, BP was elevated to 163/104 and PR 111-one time dose of ativan 1 mg was ordered.   On student interview Patient says that he was feeling good earlier and spoke to his wife on the phone then came back to his room to rest. Then, he says that his Ativan wore off and he is feeling bad now. He says that his anxiety is 4-5/10. He also has a mild tremor. He says that he is waiting to take his medications this morning and thinks that it will make him feel much better. He wants to take a shower after taking his medications to feel more comfortable. He says that he is not currently sweating. Last night, he received trazodone which he says improved his sleep compared to yesterday, but that his sleep was still poor. He wants to be able to "sleep  when the sun goes down and wake up when the sun comes up." He denies SI, HI, AVH.   He mentions multiple stressors over the past few years that have contributed to his relapse. His dog got sick 4 years ago and has required multiple surgeries, his wife was diagnosed with colon cancer 3 years ago, his dad got sick, multiple close friends have died over the last 2 years from cancer, and his brother was murdered. His mother's birthday was yesterday and he is stressed about being hospitalized.   He is agreeable with the plan to continue Ativan taper  until completion on 9/22. He wants resources for outpatient treatment on discharge, but declines referral to residential rehab. He is anxious about missing many days of work as he owns a [employer], advertising and "none of the guys know where I am. Only one person knows and he won't tell anyone." He says that his wife's imaging appointment for her cancer has been moved from 9/22 to 9/23, so he doesn't want to miss it.  Diagnosis:  Final diagnoses:  Alcohol abuse  Substance induced mood disorder ([place])    Total Time spent with patient: 30 minutes  Past Psychiatric History: See H&P Past Medical History:  Past Medical History:  Diagnosis Date   Alcoholic ([place])    Anxiety    DVT (deep venous thrombosis) ([place])    Hypertension     Past Surgical History:  Procedure Laterality Date  BLOOD PATCH     TONSILLECTOMY     Family History:  Family History  Problem Relation Age of Onset   Hypertension Father    Family Psychiatric  History: None Social History:  Social History   Substance and Sexual Activity  Alcohol Use No   Alcohol/week: 12.0 standard drinks   Types: 12 Cans of beer per week     Social History   Substance and Sexual Activity  Drug Use No   Comment: marijuana in the past    Social History   Socioeconomic History   Marital status: Single    Spouse name: Not on file   Number of children: Not on file   Years of education: Not  on file   Highest education level: Not on file  Occupational History   Not on file  Tobacco Use   Smoking status: Never   Smokeless tobacco: Not on file  Substance and Sexual Activity   Alcohol use: No    Alcohol/week: 12.0 standard drinks    Types: 12 Cans of beer per week   Drug use: No    Comment: marijuana in the past   Sexual activity: Not on file  Other Topics Concern   Not on file  Social History Narrative   Pt works odd Architect jobs and delivers furniture. Lives with fiancee in [place], [place].    Social Determinants of Health   Financial Resource Strain: Not on file  Food Insecurity: Not on file  Transportation Needs: Not on file  Physical Activity: Not on file  Stress: Not on file  Social Connections: Not on file   SDOH:  SDOH Screenings   Alcohol Screen: Medium Risk   Last Alcohol Screening Score (AUDIT): 32  Depression (PHQ2-9): Not on file  Financial Resource Strain: Not on file  Food Insecurity: Not on file  Housing: Not on file  Physical Activity: Not on file  Social Connections: Not on file  Stress: Not on file  Tobacco Use: Unknown   Smoking Tobacco Use: Never   Smokeless Tobacco Use: Unknown  Transportation Needs: Not on file   Additional Social History:                         Sleep: Poor  Appetite:  Fair  Current Medications:  Current Facility-Administered Medications  Medication Dose Route Frequency Provider Last Rate Last Admin   alum & mag hydroxide-simeth (MAALOX/MYLANTA) 200-200-20 MG/5ML suspension 30 mL  30 mL Oral Q4H PRN White, Patrice L, NP       hydrOXYzine (ATARAX/VISTARIL) tablet 25 mg  25 mg Oral Q6H PRN White, Patrice L, NP   25 mg at 12/11/20 1157   loperamide (IMODIUM) capsule 2-4 mg  2-4 mg Oral PRN White, Patrice L, NP       LORazepam (ATIVAN) tablet 1 mg  1 mg Oral Q6H PRN White, Patrice L, NP   1 mg at 12/09/20 1225   LORazepam (ATIVAN) tablet 1 mg  1 mg Oral BID White, Patrice L, NP       Followed by    Derrill Memo ON 12/13/2020] LORazepam (ATIVAN) tablet 1 mg  1 mg Oral Daily White, Patrice L, NP       magnesium hydroxide (MILK OF MAGNESIA) suspension 30 mL  30 mL Oral Daily PRN White, Patrice L, NP       multivitamin with minerals tablet 1 tablet  1 tablet Oral Daily White, Patrice L, NP   1 tablet at  12/11/20 0909   ondansetron (ZOFRAN-ODT) disintegrating tablet 4 mg  4 mg Oral Q6H PRN White, Patrice L, NP   4 mg at 12/09/20 1224   thiamine tablet 100 mg  100 mg Oral Daily White, Patrice L, NP   100 mg at 12/11/20 5809   traZODone (DESYREL) tablet 100 mg  100 mg Oral QHS Ival Bible, MD       Current Outpatient Medications  Medication Sig Dispense Refill   acetaminophen (TYLENOL) 325 MG tablet Take 325 mg by mouth every 6 (six) hours as needed (pain/headache).      Labs  Lab Results:  Admission on 12/09/2020  Component Date Value Ref Range Status   SARS Coronavirus 2 by RT PCR 12/09/2020 NEGATIVE  NEGATIVE Final   Comment: (NOTE) SARS-CoV-2 target nucleic acids are NOT DETECTED.  The SARS-CoV-2 RNA is generally detectable in upper respiratory specimens during the acute phase of infection. The lowest concentration of SARS-CoV-2 viral copies this assay can detect is 138 copies/mL. A negative result does not preclude SARS-Cov-2 infection and should not be used as the sole basis for treatment or other patient management decisions. A negative result may occur with  improper specimen collection/handling, submission of specimen other than nasopharyngeal swab, presence of viral mutation(s) within the areas targeted by this assay, and inadequate number of viral copies(<138 copies/mL). A negative result must be combined with clinical observations, patient history, and epidemiological information. The expected result is Negative.  Fact Sheet for Patients:  EntrepreneurPulse.com.au  Fact Sheet for Healthcare Providers:   IncredibleEmployment.be  This test is no                          t yet approved or cleared by the [place] FDA and  has been authorized for detection and/or diagnosis of SARS-CoV-2 by FDA under an Emergency Use Authorization (EUA). This EUA will remain  in effect (meaning this test can be used) for the duration of the COVID-19 declaration under Section 564(b)(1) of the Act, 21 U.S.C.section 360bbb-3(b)(1), unless the authorization is terminated  or revoked sooner.       Influenza A by PCR 12/09/2020 NEGATIVE  NEGATIVE Final   Influenza B by PCR 12/09/2020 NEGATIVE  NEGATIVE Final   Comment: (NOTE) The Xpert Xpress SARS-CoV-2/FLU/RSV plus assay is intended as an aid in the diagnosis of influenza from Nasopharyngeal swab specimens and should not be used as a sole basis for treatment. Nasal washings and aspirates are unacceptable for Xpert Xpress SARS-CoV-2/FLU/RSV testing.  Fact Sheet for Patients: EntrepreneurPulse.com.au  Fact Sheet for Healthcare Providers: IncredibleEmployment.be  This test is not yet approved or cleared by the [place] FDA and has been authorized for detection and/or diagnosis of SARS-CoV-2 by FDA under an Emergency Use Authorization (EUA). This EUA will remain in effect (meaning this test can be used) for the duration of the COVID-19 declaration under Section 564(b)(1) of the Act, 21 U.S.C. section 360bbb-3(b)(1), unless the authorization is terminated or revoked.  Performed at [facility], [place] [address] [place], [place] [address]    WBC 12/09/2020 3.9 (A) 4.0 - 10.5 K/uL Final   RBC 12/09/2020 5.34  4.22 - 5.81 MIL/uL Final   Hemoglobin 12/09/2020 13.0  13.0 - 17.0 g/dL Final   HCT 12/09/2020 40.6  39.0 - 52.0 % Final   MCV 12/09/2020 76.0 (A) 80.0 - 100.0 fL Final   MCH 12/09/2020 24.3 (A) 26.0 - 34.0 pg Final  MCHC 12/09/2020 32.0  30.0 - 36.0 g/dL Final   RDW 12/09/2020  15.9 (A) 11.5 - 15.5 % Final   Platelets 12/09/2020 25 (A) 150 - 400 K/uL Final   Comment: Immature Platelet Fraction may be clinically indicated, consider ordering this additional test [id number] REPEATED TO VERIFY PLATELET COUNT CONFIRMED BY SMEAR THIS CRITICAL RESULT HAS VERIFIED AND BEEN CALLED TO ANTHANY ADAMS RN BY KYUNG Teller ON 09 18 2022 AT 99, AND HAS BEEN READ BACK.     nRBC 12/09/2020 0.0  0.0 - 0.2 % Final   Neutrophils Relative % 12/09/2020 67  % Final   Neutro Abs 12/09/2020 2.6  1.7 - 7.7 K/uL Final   Lymphocytes Relative 12/09/2020 20  % Final   Lymphs Abs 12/09/2020 0.8  0.7 - 4.0 K/uL Final   Monocytes Relative 12/09/2020 7  % Final   Monocytes Absolute 12/09/2020 0.3  0.1 - 1.0 K/uL Final   Eosinophils Relative 12/09/2020 1  % Final   Eosinophils Absolute 12/09/2020 0.0  0.0 - 0.5 K/uL Final   Basophils Relative 12/09/2020 1  % Final   Basophils Absolute 12/09/2020 0.0  0.0 - 0.1 K/uL Final   Immature Granulocytes 12/09/2020 4  % Final   Abs Immature Granulocytes 12/09/2020 0.14 (A) 0.00 - 0.07 K/uL Final   Performed at [facility], [place] [address], [place], [place] [address]   Sodium 12/09/2020 137  135 - 145 mmol/L Final   Potassium 12/09/2020 3.6  3.5 - 5.1 mmol/L Final   Chloride 12/09/2020 98  98 - 111 mmol/L Final   CO2 12/09/2020 24  22 - 32 mmol/L Final   Glucose, Bld 12/09/2020 266 (A) 70 - 99 mg/dL Final   Glucose reference range applies only to samples taken after fasting for at least 8 hours.   BUN 12/09/2020 <5 (A) 6 - 20 mg/dL Final   Creatinine, Ser 12/09/2020 0.55 (A) 0.61 - 1.24 mg/dL Final   Calcium 12/09/2020 8.5 (A) 8.9 - 10.3 mg/dL Final   Total Protein 12/09/2020 7.2  6.5 - 8.1 g/dL Final   Albumin 12/09/2020 4.1  3.5 - 5.0 g/dL Final   AST 12/09/2020 138 (A) 15 - 41 U/L Final   ALT 12/09/2020 104 (A) 0 - 44 U/L Final   Alkaline Phosphatase 12/09/2020 91  38 - 126 U/L Final   Total Bilirubin 12/09/2020 1.5 (A) 0.3 - 1.2 mg/dL Final    GFR, Estimated 12/09/2020 >60  >60 mL/min Final   Comment: (NOTE) Calculated using the CKD-EPI Creatinine Equation (2021)    Anion gap 12/09/2020 15  5 - 15 Final   Performed at [facility] [address], [place], [place] [address]   Alcohol, Ethyl (B) 12/09/2020 388 (A) <10 mg/dL Final   Comment: CRITICAL RESULT CALLED TO, READ BACK BY AND VERIFIED WITH: A ADAMS RN BY SSTEPHENS 1538 [id number] (NOTE) Lowest detectable limit for serum alcohol is 10 mg/dL.  For medical purposes only. Performed at [facility], [place] [address] [place], [place] [address]    TSH 12/09/2020 1.427  0.350 - 4.500 uIU/mL Final   Comment: Performed by a 3rd Generation assay with a functional sensitivity of <=0.01 uIU/mL. Performed at [facility], [place] [address] [place], [place] [address]    Cholesterol 12/09/2020 241 (A) 0 - 200 mg/dL Final   Triglycerides 12/09/2020 64  <150 mg/dL Final   HDL 12/09/2020 100  >40 mg/dL Final   Total CHOL/HDL Ratio 12/09/2020 2.4  RATIO Final   VLDL 12/09/2020 13  0 - 40 mg/dL Final   LDL Cholesterol 12/09/2020 128 (A) 0 - 99 mg/dL Final   Comment:        Total Cholesterol/HDL:CHD Risk Coronary Heart Disease Risk Table                     Men   Women  1/2 Average Risk   3.4   3.3  Average Risk       5.0   4.4  2 X Average Risk   9.6   7.1  3 X Average Risk  23.4   11.0        Use the calculated Patient Ratio above and the CHD Risk Table to determine the patient's CHD Risk.        ATP III CLASSIFICATION (LDL):  <100     mg/dL   Optimal  100-129  mg/dL   Near or Above                    Optimal  130-159  mg/dL   Borderline  160-189  mg/dL   High  >190     mg/dL   Very High Performed at [facility] [address], [place], [place] [address]    Hgb A1c MFr Bld 12/09/2020 7.4 (A) 4.8 - 5.6 % Final   Comment: (NOTE) Pre diabetes:          5.7%-6.4%  Diabetes:              >6.4%  Glycemic control for   <7.0% adults with diabetes     Mean Plasma Glucose 12/09/2020 165.68  mg/dL Final   Performed at [facility], [place] [address] [place], [place] [address]   POC Amphetamine UR 12/09/2020 None Detected  NONE DETECTED (Cut Off Level 1000 ng/mL) Final   POC Secobarbital (BAR) 12/09/2020 None Detected  NONE DETECTED (Cut Off Level 300 ng/mL) Final   POC Buprenorphine (BUP) 12/09/2020 None Detected  NONE DETECTED (Cut Off Level 10 ng/mL) Final   POC Oxazepam (BZO) 12/09/2020 Positive (A) NONE DETECTED (Cut Off Level 300 ng/mL) Final   POC Cocaine UR 12/09/2020 None Detected  NONE DETECTED (Cut Off Level 300 ng/mL) Final   POC Methamphetamine UR 12/09/2020 None Detected  NONE DETECTED (Cut Off Level 1000 ng/mL) Final   POC Morphine 12/09/2020 None Detected  NONE DETECTED (Cut Off Level 300 ng/mL) Final   POC Oxycodone UR 12/09/2020 Positive (A) NONE DETECTED (Cut Off Level 100 ng/mL) Final   POC Methadone UR 12/09/2020 None Detected  NONE DETECTED (Cut Off Level 300 ng/mL) Final   POC Marijuana UR 12/09/2020 None Detected  NONE DETECTED (Cut Off Level 50 ng/mL) Final   SARS Coronavirus 2 Ag 12/09/2020 Negative  Negative Final   SARSCOV2ONAVIRUS 2 AG 12/09/2020 NEGATIVE  NEGATIVE Final   Comment: (NOTE) SARS-CoV-2 antigen NOT DETECTED.   Negative results are presumptive.  Negative results do not preclude SARS-CoV-2 infection and should not be used as the sole basis for treatment or other patient management decisions, including infection  control decisions, particularly in the presence of clinical signs and  symptoms consistent with COVID-19, or in those who have been in contact with the virus.  Negative results must be combined with clinical observations, patient history, and epidemiological information. The expected result is Negative.  Fact Sheet for Patients: HandmadeRecipes.com.cy  Fact Sheet for Healthcare Providers: FuneralLife.at  This test is not yet  approved or  cleared by the [place] and  has been authorized for detection and/or diagnosis of SARS-CoV-2 by FDA under an Emergency Use Authorization (EUA).  This EUA will remain in effect (meaning this test can be used) for the duration of  the COV                          ID-19 declaration under Section 564(b)(1) of the Act, 21 U.S.C. section 360bbb-3(b)(1), unless the authorization is terminated or revoked sooner.      Blood Alcohol level:  Lab Results  Component Value Date   ETH 388 (HH) 12/09/2020   ETH 198 (H) 93/73/4287    Metabolic Disorder Labs: Lab Results  Component Value Date   HGBA1C 7.4 (H) 12/09/2020   MPG 165.68 12/09/2020   No results found for: PROLACTIN Lab Results  Component Value Date   CHOL 241 (H) 12/09/2020   TRIG 64 12/09/2020   HDL 100 12/09/2020   CHOLHDL 2.4 12/09/2020   VLDL 13 12/09/2020   LDLCALC 128 (H) 12/09/2020   LDLCALC 187 (H) 08/06/2012    Therapeutic Lab Levels: No results found for: LITHIUM No results found for: VALPROATE No components found for:  CBMZ  Physical Findings   AUDIT    Flowsheet Row ED [facility] 12/09/2020 in [facility]  Alcohol Use Disorder Identification Test Final Score (AUDIT) 32      PHQ2-9    [facility] Visit from 11/22/2014 in [facility]  PHQ-2 Total Score 1      [place] ED from 12/09/2020 in [facility] Most recent reading at 12/09/2020  5:25 PM OP Visit from 12/09/2020 in [facility] Most recent reading at 12/09/2020 10:40 AM  C-SSRS RISK CATEGORY No Risk No Risk        Musculoskeletal  Strength & Muscle Tone: within normal limits Gait & Station:  not assessed, patient laying in bed during interview Patient leans:  not assessed, patient laying in bed  Psychiatric Specialty Exam  Presentation  General Appearance: Appropriate for Environment; Casual, Laying in bed with  hands clasped over his chest, mild tremor observed in hands  Eye Contact:Fair  Speech:Clear and Coherent; Normal Rate  Speech Volume:Normal  Handedness: No data recorded  Mood and Affect  Mood: "a little bit better"  Affect: Congruent, Cooperative, Anxious   Thought Process  Thought Processes:Coherent; Goal Directed; Linear  Descriptions of Associations:Intact  Orientation:Full (Time, Place, Person, Situation)  Thought Content:WDL; Logical  Diagnosis of Schizophrenia or Schizoaffective disorder in past: No    Hallucinations:Hallucinations: None  Ideas of Reference:None  Suicidal Thoughts:No Homicidal Thoughts:No   Sensorium  Memory:Immediate Good; Recent Good; Remote Good  Judgment:Fair  Insight:Fair   Executive Functions  Concentration:Good  Attention Span:Good  [place] of Knowledge:Good  Language:Good   Psychomotor Activity  Psychomotor Activity:Psychomotor Activity: mild tremor in BUE with outstretched arms otherwise normal   Assets  Assets:Communication Skills; Desire for Improvement; Housing; Social Support; Resilience   Sleep  Sleep: Patient reports that trazodone helped, but that sleep was still poor   No data recorded  Physical Exam  Physical Exam Vitals reviewed.  HENT:     Head: Normocephalic and atraumatic.     Right Ear: External ear normal.     Left Ear: External ear normal.     Nose: Nose normal.  Eyes:     General: No scleral  icterus.    Extraocular Movements: Extraocular movements intact.     Conjunctiva/sclera: Conjunctivae normal.  Pulmonary:     Effort: Pulmonary effort is normal.  Neurological:     General: No focal deficit present.     Mental Status: He is alert.   Review of Systems  Constitutional:  Negative for chills and fever.  HENT:  Negative for hearing loss.   Eyes:  Negative for discharge and redness.  Respiratory:  Negative for cough.   Cardiovascular:  Negative for chest pain.   Gastrointestinal:  Negative for abdominal pain.  Neurological:  Positive for tremors. Negative for headaches.  Psychiatric/Behavioral:  Positive for substance abuse. Negative for depression, hallucinations and suicidal ideas. The patient is nervous/anxious and has insomnia.   Blood pressure (!) 183/91, pulse 98, temperature 98.2 F (36.8 C), temperature source Tympanic, resp. rate 18, SpO2 99 %. There is no height or weight on file to calculate BMI.  Treatment Plan Summary: Daily contact with patient to assess and evaluate symptoms and progress in treatment  50 yo male with AUD who relapsed ~30 days ago after being in remission for 8 years. BAL 388 on 9/18 @ 1140. Ativan taper started with CIWA protocol on admission. Most recent CIWA 3 @ 11:16. Patient denies SI/HI/AVH although continues to display ssx c/w with alcohol withdrawal although overall improved. Patient is appropriate for continued admission to the [facility] for alcohol detox/further monitoring.   Alcohol use disorder -continue ativan taper previously ordered:1 mg TID followed by 1 mg BID followed by 1 mg QAM -received one time dose 1 mg on 9/20 @ 1302 due to elevated BP (163/104)and HR (111) -continue CIWA protocol; most recent CIWA 3 -patient declines residential rehab at this time but is open to outpatient treatment  Insomnia -trazodone 100 gm qhs   Thrombocytopenia -platelets 25 on routine lab work -On 9/18 NP called ED MD who recommended to stop all NSAIDS. NP also spoke with spoke with Internal medicine; recommended that patient can follow up out patient as no active bleeding or signs of infection -patient will need heme-onc referral on discharge   Dispo: Ongoing. likely home after completion of ativan taper on 9/22  Ival Bible, MD 12/11/2020 4:11 PM

## 2020-12-11 NOTE — Clinical Social Work Psych Note (Addendum)
Self-Sabotaging Behaviors  Self-Sabotaging Behaviors & Self Awareness   Date: 12/11/20  Type of Therapy and Topic:   Participation Level: Active   Objective: In this group, patients will learn how to identify obstacles, self-sabotaging and enabling behaviors, as well as: what are they, why do we do them and what needs these behaviors meet. Discuss unhealthy relationships and how to have positive healthy boundaries with those that sabotage and enable. Explore aspects of self-sabotage and enabling in yourself and how to limit these self-destructive behaviors in everyday life.  Therapeutic Goals:  Patient will identify one obstacle that relates to self-sabotage and enabling behaviors Patient will identify one personal self-sabotaging or enabling behavior they did prior to admission Patient will state a plan to change the above identified behavior Patient will demonstrate ability to communicate their needs through discussion and/or role play.   Summary of Patient's Progress:  [name] was engaged and participated throughout. [name] is completing the worksheet associated with the group's discussion.

## 2020-12-11 NOTE — ED Notes (Signed)
Patients BP elevated.  Provider is not available at this time - will pass along to day shift

## 2020-12-11 NOTE — ED Notes (Signed)
Pt resting in room, A&O x4, calm and cooperative. Denies current SI/HI/AVH. Denies any immediate needs. No signs of acute distress noted. Will continue to monitor for safety.

## 2020-12-12 ENCOUNTER — Encounter (HOSPITAL_COMMUNITY): Payer: Self-pay | Admitting: Psychiatry

## 2020-12-12 DIAGNOSIS — F10930 Alcohol use, unspecified with withdrawal, uncomplicated: Secondary | ICD-10-CM | POA: Diagnosis present

## 2020-12-12 DIAGNOSIS — Z79899 Other long term (current) drug therapy: Secondary | ICD-10-CM | POA: Diagnosis not present

## 2020-12-12 DIAGNOSIS — F10239 Alcohol dependence with withdrawal, unspecified: Secondary | ICD-10-CM

## 2020-12-12 DIAGNOSIS — F1994 Other psychoactive substance use, unspecified with psychoactive substance-induced mood disorder: Secondary | ICD-10-CM | POA: Diagnosis not present

## 2020-12-12 DIAGNOSIS — F10939 Alcohol use, unspecified with withdrawal, unspecified: Secondary | ICD-10-CM

## 2020-12-12 DIAGNOSIS — Z20822 Contact with and (suspected) exposure to covid-19: Secondary | ICD-10-CM | POA: Diagnosis not present

## 2020-12-12 MED ORDER — ONDANSETRON 4 MG PO TBDP: 4.0000 mg | ORAL_TABLET | Freq: Four times a day (QID) | ORAL | Status: DC | PRN

## 2020-12-12 MED ORDER — LOPERAMIDE HCL 2 MG PO CAPS: 2.0000 mg | ORAL_CAPSULE | ORAL | Status: DC | PRN

## 2020-12-12 MED ORDER — CALCIUM CARBONATE ANTACID 500 MG PO CHEW
1.0000 | CHEWABLE_TABLET | Freq: Two times a day (BID) | ORAL | Status: DC | PRN
  Administered 2020-12-12: 200 mg via ORAL
  Filled 2020-12-12: qty 1

## 2020-12-12 MED ORDER — LORAZEPAM 1 MG PO TABS: 1.0000 mg | ORAL_TABLET | Freq: Four times a day (QID) | ORAL | Status: DC | PRN

## 2020-12-12 MED ORDER — THIAMINE HCL 100 MG PO TABS: 100.0000 mg | ORAL_TABLET | Freq: Every day | ORAL | 0 refills | Status: AC

## 2020-12-12 MED ORDER — HYDROXYZINE HCL 25 MG PO TABS
25.0000 mg | ORAL_TABLET | Freq: Four times a day (QID) | ORAL | Status: DC | PRN
  Administered 2020-12-12 – 2020-12-13 (×2): 25 mg via ORAL
  Filled 2020-12-12 (×2): qty 1

## 2020-12-12 MED ORDER — ADULT MULTIVITAMIN W/MINERALS CH: 1.0000 | ORAL_TABLET | Freq: Every day | ORAL | 0 refills | Status: DC

## 2020-12-12 MED ORDER — TRAZODONE HCL 100 MG PO TABS: 100.0000 mg | ORAL_TABLET | Freq: Every day | ORAL | 0 refills | Status: DC

## 2020-12-12 NOTE — ED Notes (Signed)
Pt resting in bed. A&O x4, calm and cooperative. Pt denies current SI/HI/AVH. Pt denies any immediate needs. No signs of acute distress noted. Will continue to monitor for safety.

## 2020-12-12 NOTE — ED Provider Notes (Signed)
[facility] Discharge Suicide Risk Assessment   Principal Problem: Alcohol withdrawal ([place]) Discharge Diagnoses: Principal Problem:   Alcohol withdrawal ([place]) Active Problems:   Substance induced mood disorder ([place])   Total Time spent with patient: 20 minutes  Musculoskeletal: Strength & Muscle Tone: within normal limits Gait & Station: normal Patient leans: N/A  Psychiatric Specialty Exam  Presentation  General Appearance: Appropriate for Environment  Eye Contact:Good  Speech:Clear and Coherent; Normal Rate  Speech Volume:Normal  Handedness: No data recorded  Mood and Affect  Mood:Euthymic ("good")  Duration of Depression Symptoms: Greater than two weeks  Affect:Appropriate; Congruent   Thought Process  Thought Processes:Coherent; Goal Directed; Linear  Descriptions of Associations:Intact  Orientation:Full (Time, Place and Person)  Thought Content:WDL; Logical  History of Schizophrenia/Schizoaffective disorder:No  Duration of Psychotic Symptoms:No data recorded Hallucinations:Hallucinations: None  Ideas of Reference:None  Suicidal Thoughts:Suicidal Thoughts: No  Homicidal Thoughts:Homicidal Thoughts: No   Sensorium  Memory:Immediate Good; Recent Good; Remote Good  Judgment:Good  Insight:Fair   Executive Functions  Concentration:Good  Attention Span:Good  [place] of Knowledge:Good  Language:Good   Psychomotor Activity  Psychomotor Activity:Psychomotor Activity: Normal   Assets  Assets:Communication Skills; Desire for Improvement; Housing; Resilience; Social Support; Vocational/Educational   Sleep  Sleep:Sleep: Fair (improving)   Physical Exam: Physical Exam Constitutional:      Appearance: Normal appearance. He is normal weight.  HENT:     Head: Normocephalic and atraumatic.  Eyes:     Extraocular Movements: Extraocular movements intact.  Pulmonary:     Effort: Pulmonary effort is normal.  Neurological:      General: No focal deficit present.     Mental Status: He is alert and oriented to person, place, and time.   Review of Systems  Constitutional:  Negative for chills and fever.  HENT:  Negative for hearing loss.   Eyes:  Negative for discharge and redness.  Respiratory:  Negative for cough.   Cardiovascular:  Negative for chest pain.  Gastrointestinal:  Negative for abdominal pain.  Musculoskeletal:  Negative for myalgias.  Neurological:  Positive for tremors. Negative for headaches.       Very mild tremor in left hand with outstretched arm, most recent CIWA 0  Psychiatric/Behavioral:  Positive for substance abuse. Negative for depression, hallucinations and suicidal ideas.   Blood pressure (!) 150/84, pulse 85, temperature 98 F (36.7 C), temperature source Oral, resp. rate 16, SpO2 98 %. There is no height or weight on file to calculate BMI.  Mental Status Per Nursing Assessment::   On Admission:   did not report SI, patient denied SI throughout his stay  Demographic Factors:  Male and Caucasian  Loss Factors: Loss of significant relationship and loss of sobriety  Historical Factors: NA  Risk Reduction Factors:   Sense of responsibility to family, Employed, Living with another person, especially a relative, and Positive social support  Continued Clinical Symptoms:  Alcohol/Substance Abuse/Dependencies  Cognitive Features That Contribute To Risk:  None    Suicide Risk:  Minimal: No identifiable suicidal ideation.  Patients presenting with no risk factors but with morbid ruminations; may be classified as minimal risk based on the severity of the depressive symptoms   Follow-up [place]. Go to.   Specialty: Behavioral Health Why: Please go during walk-in hours to establish outpatient psychiatric services for continuity of care.   Medication Management Walk-in Hours Monday-Friday from 8:00am-11:00am. Please arrive between  7:30am-7:45am, as patients are seen on a  first come, first served basis.   Therapy Walk-in Hours Monday-Wednesday from 8:00am-until slots are full. Please arrive between 7:30am-7:45am, as patients are seen on a first come, first served basis.   On Friday, therapy walk-in hours are from 1:00pm-5:00pm. Contact information: [place]        [facility]. Call.   Why: Please call to establish any outpatient substance use services. Please be sure to provide any discharge paperwork from this encounter. Also, please provide any insurance information, if available. Contact information: [place], [place] [address]  Phone:  [phone] Fax:  [phone]                Plan Of Care/Follow-up recommendations:  Activity:  as tolerated Diet:  regular Other:     Patient is instructed prior to discharge to: Take all medications as prescribed by his/her mental healthcare provider. Report any adverse effects and or reactions from the medicines to his/her outpatient provider promptly. Patient has been instructed & cautioned: To not engage in alcohol and or illegal drug use while on prescription medicines. In the event of worsening symptoms, patient is instructed to call the crisis hotline, 911 and or go to the nearest ED for appropriate evaluation and treatment of symptoms. To follow-up with his/her primary care provider for your other medical issues, concerns and or health care needs.   Patient provided with 7 day samples and scripts for 30 days with no refills. Resources provided at discharge for outpatient treatment for medication management, therapy and substance use treatment.    TAKE these medications    multivitamin with minerals Tabs tablet Take 1 tablet by mouth daily. Start taking on: December 13, 2020   thiamine 100 MG tablet Take 1 tablet (100 mg total) by mouth daily. Start taking on:  December 13, 2020   traZODone 100 MG tablet Commonly known as: DESYREL Take 1 tablet (100 mg total) by mouth at bedtime.         Ival Bible, MD 12/12/2020, 3:02 PM

## 2020-12-12 NOTE — ED Provider Notes (Addendum)
FBC/OBS ASAP Discharge Summary  Date and Time: 12/12/2020 3:03 PM Name: [name]  MRN:  [id number]   Discharge Diagnoses:  Final diagnoses:  Alcohol abuse  Substance induced mood disorder ([place])    Subjective:  Patient seen and chart reviewed. Most recent CIWA from this AM 0; prior to that CIWA was 2. Patient observed interacting appropriatelywith peers and staff prior to interview this afternoon. Affect appears much brighter this morning, he describes his mood as "good". He denies issues with appetite and reports sleeping better with increased dose of trazodone. He denies alcohol withdrawal symptoms of headaches, GI upset, nausea, vomiting, sweating, anxiety, he reports improvement in tremors and indicates that he "has not noticed" any today.  Discussed completing Ativan taper tomorrow morning.  Patient requests if he will be provided Ativan at discharge, he is informed that he will not be provided Ativan on discharge but that he will be provided Vistaril and trazodone which he has been receiving while at the [facility].  Patient verbalized understanding.  Patient denies SI or HI AVH.  Patient states that his wife will pick him up at 68 tomorrow so that he can accompany her to her appointment in [facility].  Stay Summary: Patient initially presented to behavioral health Hospital as a walk-in on 12/09/2020.  Patient reported that he was going through alcohol withdrawal after he relapsed 30 days ago on alcohol.  He reported being sober for 10 years prior to this.  He reported drinking about 15 beers a day.  Patient was transferred to the [facility] for further treatment of alcohol withdrawal.  Patient was started on alcohol detox protocol including monitoring withdrawal symptoms using CIWA protocol.Marland Kitchen  He was also started on a 4-day Ativan taper; Ativan 1 mg 4 times daily, followed by Ativan 1 mg 3 times daily, followed by Ativan 1 mg twice daily, followed by Ativan 1 mg in the morning.  In terms of her  withdrawal symptoms, CIWA was never above 10 and he did not meet the parameters to receive as needed Ativan.  He did receive a one-time dose of Ativan 1 mg on 9/20 due to elevated blood pressure 164/104 and pulse rate of 111.  He tolerated medications well.  He was initially prescribed trazodone 50 mg as needed nightly for sleep, this was increased on the night of 9/20 to 50 mg nightly.  Patient tolerated medication increase well and reported improvement in sleep.  Ativan taper scheduled to end 9/22; patient requested discharge at this date as he could attend a doctor's appointment with his wife.  Patient's withdrawal symptoms are greatly improved at time of discharge- he no longer displayed ssx c/w withdawal or reported symptoms consistent with acute withdrawal and was deemed appropriate for discharge.  Discussed follow-up with outpatient substance use and individual counseling and psychiatry; patient indicated that he was not interested at this time however he would like outpatient resources in the event he changes his mind in the future.  Throughout his stay Rolfe attended groups and participated; he was appropriate with peers and staff and was not a management issue on the unit.  Total Time spent with patient: 20 minutes  Past Psychiatric History: see H&P Past Medical History:  Past Medical History:  Diagnosis Date   Alcoholic ([place])    Anxiety    DVT (deep venous thrombosis) ([place])    Hypertension     Past Surgical History:  Procedure Laterality Date   BLOOD PATCH     TONSILLECTOMY  Family History:  Family History  Problem Relation Age of Onset   Hypertension Father    Family Psychiatric History: see H&P Social History:  Social History   Substance and Sexual Activity  Alcohol Use No   Alcohol/week: 12.0 standard drinks   Types: 12 Cans of beer per week     Social History   Substance and Sexual Activity  Drug Use No   Comment: marijuana in the past    Social History    Socioeconomic History   Marital status: Single    Spouse name: Not on file   Number of children: Not on file   Years of education: Not on file   Highest education level: Not on file  Occupational History   Not on file  Tobacco Use   Smoking status: Never   Smokeless tobacco: Never  Substance and Sexual Activity   Alcohol use: No    Alcohol/week: 12.0 standard drinks    Types: 12 Cans of beer per week   Drug use: No    Comment: marijuana in the past   Sexual activity: Not on file  Other Topics Concern   Not on file  Social History Narrative   Pt works odd Architect jobs and delivers furniture. Lives with fiancee in [place], [place].    Social Determinants of Health   Financial Resource Strain: Not on file  Food Insecurity: Not on file  Transportation Needs: Not on file  Physical Activity: Not on file  Stress: Not on file  Social Connections: Not on file   SDOH:  SDOH Screenings   Alcohol Screen: Medium Risk   Last Alcohol Screening Score (AUDIT): 32  Depression (PHQ2-9): Not on file  Financial Resource Strain: Not on file  Food Insecurity: Not on file  Housing: Not on file  Physical Activity: Not on file  Social Connections: Not on file  Stress: Not on file  Tobacco Use: Low Risk    Smoking Tobacco Use: Never   Smokeless Tobacco Use: Never  Transportation Needs: Not on file    Tobacco Cessation:  N/A, patient does not currently use tobacco products  Current Medications:  Current Facility-Administered Medications  Medication Dose Route Frequency Provider Last Rate Last Admin   alum & mag hydroxide-simeth (MAALOX/MYLANTA) 200-200-20 MG/5ML suspension 30 mL  30 mL Oral Q4H PRN White, Patrice L, NP       calcium carbonate (TUMS - dosed in mg elemental calcium) chewable tablet 200 mg of elemental calcium  1 tablet Oral BID PRN Ival Bible, MD   200 mg of elemental calcium at 12/12/20 1645   hydrOXYzine (ATARAX/VISTARIL) tablet 25 mg  25 mg Oral Q6H PRN  Ival Bible, MD   25 mg at 12/13/20 8841   hydrOXYzine (ATARAX/VISTARIL) tablet 25 mg  25 mg Oral Q8H PRN Ival Bible, MD       loperamide (IMODIUM) capsule 2-4 mg  2-4 mg Oral PRN Ival Bible, MD       LORazepam (ATIVAN) tablet 1 mg  1 mg Oral Q6H PRN Ival Bible, MD       magnesium hydroxide (MILK OF MAGNESIA) suspension 30 mL  30 mL Oral Daily PRN White, Patrice L, NP       multivitamin with minerals tablet 1 tablet  1 tablet Oral Daily White, Patrice L, NP   1 tablet at 12/13/20 0914   ondansetron (ZOFRAN-ODT) disintegrating tablet 4 mg  4 mg Oral Q6H PRN Ival Bible, MD  thiamine tablet 100 mg  100 mg Oral Daily White, Patrice L, NP   100 mg at 12/13/20 0913   traZODone (DESYREL) tablet 100 mg  100 mg Oral QHS Ival Bible, MD   100 mg at 12/12/20 2107   Current Outpatient Medications  Medication Sig Dispense Refill   hydrOXYzine (ATARAX/VISTARIL) 25 MG tablet Take 1 tablet (25 mg total) by mouth every 8 (eight) hours as needed for anxiety. 90 tablet 0   Multiple Vitamin (MULTIVITAMIN WITH MINERALS) TABS tablet Take 1 tablet by mouth daily. 30 tablet 0   thiamine 100 MG tablet Take 1 tablet (100 mg total) by mouth daily. 30 tablet 0   traZODone (DESYREL) 100 MG tablet Take 1 tablet (100 mg total) by mouth at bedtime. 30 tablet 0    PTA Medications: (Not in a hospital admission)   Musculoskeletal  Strength & Muscle Tone: within normal limits Gait & Station: normal Patient leans: N/A  Psychiatric Specialty Exam  Presentation  General Appearance: Appropriate for Environment  Eye Contact:Good  Speech:Clear and Coherent; Normal Rate  Speech Volume:Normal  Handedness: No data recorded  Mood and Affect  Mood:Euthymic ("good")  Affect:Appropriate; Congruent   Thought Process  Thought Processes:Coherent; Goal Directed; Linear  Descriptions of Associations:Intact  Orientation:Full (Time, Place and  Person)  Thought Content:WDL; Logical  Diagnosis of Schizophrenia or Schizoaffective disorder in past: No    Hallucinations:Hallucinations: None  Ideas of Reference:None  Suicidal Thoughts:Suicidal Thoughts: No  Homicidal Thoughts:No data recorded   Sensorium  Memory:Immediate Good; Recent Good; Remote Good  Judgment:Good  Insight:Fair   Executive Functions  Concentration:Good  Attention Span:Good  [place] of Knowledge:Good  Language:Good   Psychomotor Activity  Psychomotor Activity:Psychomotor Activity: Normal   Assets  Assets:Communication Skills; Desire for Improvement; Housing; Resilience; Social Support; Vocational/Educational   Sleep  Sleep:Sleep: Fair (improving)   No data recorded  Physical Exam  See SRA for physical exam and ROS  Blood pressure (!) 142/94, pulse 84, temperature 97.8 F (36.6 C), temperature source Tympanic, resp. rate 18, SpO2 99 %. There is no height or weight on file to calculate BMI.  See SRA for full suicide risk assessment  Plan Of Care/Follow-up recommendations:  Activity:  as tolerated Diet:  regular Other:    Patient is instructed prior to discharge to: Take all medications as prescribed by his/her mental healthcare provider. Report any adverse effects and or reactions from the medicines to his/her outpatient provider promptly. Patient has been instructed & cautioned: To not engage in alcohol and or illegal drug use while on prescription medicines. In the event of worsening symptoms, patient is instructed to call the crisis hotline, 911 and or go to the nearest ED for appropriate evaluation and treatment of symptoms. To follow-up with his/her primary care provider for your other medical issues, concerns and or health care needs.        TAKE these medications    hydrOXYzine 25 MG tablet Commonly known as: ATARAX/VISTARIL Take 1 tablet (25 mg total) by mouth every 8 (eight) hours as needed for  anxiety.   multivitamin with minerals Tabs tablet Take 1 tablet by mouth daily.   thiamine 100 MG tablet Take 1 tablet (100 mg total) by mouth daily.   traZODone 100 MG tablet Commonly known as: DESYREL Take 1 tablet (100 mg total) by mouth at bedtime.         Disposition: home with with tomorrow morning  Ival Bible, MD 12/13/2020, 10:19 AM

## 2020-12-12 NOTE — Discharge Instructions (Addendum)
Please come to [facility] (this facility) during walk in hours for appointment with psychiatrist for further medication management and for therapists for therapy.    Walk in hours are 8-11 AM Monday through Thursday for medication management.Therapy walk in hours are Monday-Wednesday 8 AM-1PM.   It is first come, first -serve; it is best to arrive by 7:00 AM.   On Friday from 1 pm to 4 pm for therapy intake only. Please arrive by 12:00 pm as it is  first come, first -serve.    When you arrive please go upstairs for your appointment. If you are unsure of where to go, inform the front desk that you are here for a walk in appointment and they will assist you with directions upstairs.  Address:  [address] in [place], [place] Ph: [phone]    Substance Abuse Treatment Resources listed Below:  [facility] - Admissions are currently completed Monday through Friday at [facility]; both appointments and walk-ins are accepted.  Any individual that is a [facility] resident may present for a substance abuse screening and assessment for admission.  A person may be referred by numerous sources or self-refer.   Potential clients will be screened for medical necessity and appropriateness for the program.  Clients must meet criteria for high-intensity residential treatment services.  If clinically appropriate, a client will continue with the comprehensive clinical assessment and intake process, as well as enrollment in the [facility].  Address: [address] [place], [place] [address] Admin Hours: [place] to [place] Hours: 24/7 Phone: [phone] Fax: [place] Address: [place], [place], [place] [address] Behavioral Health Urgent Care [facility]) Hours: 24/7 Phone: [phone] Fax: [phone]  [facility] ([facility]): (offers outpatient therapy and intensive  outpatient substance abuse therapy).  [address], [place], [place] [address] Phone: [phone]  [facility]: Offers FREE recovery skills classes, support groups, 1:1 Peer Support, and Compeer Classes. [address], [place], [place] [address] Phone: [phone] (Call to complete intake).   [facility] [place], [place] [address] Phone: [phone] ext [phone] The [facility] provides food, shelter and other programs and services to the homeless men of [place] through our [employer] program.  By offering safe shelter, three meals a day, clean clothing, Biblical counseling, financial planning, vocational training, GED/education and employment assistance, we've helped mend the shattered lives of many homeless men since opening in 1974.  We have approximately 267 beds available, with a max of 312 beds including mats for emergency situations and currently house an average of 270 men a night.  Prospective Client Check-In Information Photo ID Required (State/ Out of State/ [facility]) - if photo ID is not available, clients are required to have a printout of a police/sheriff's criminal history report. Help out with chores around the [place]. No sex offender of any type (pending, charged, registered and/or any other sex related offenses) will be permitted to check in. Must be willing to abide by all rules, regulations, and policies established by the [employer]. The following will be provided - shelter, food, clothing, and biblical counseling. If you or someone you know is in need of assistance at our [facility] shelter in [place], [place], please call [phone] ext. [phone].  [facility]-will provide timely access to mental health services for children and adolescents (4-17) and adults  presenting in a mental health crisis. The program is designed for those who need urgent Behavioral  Health or Substance Use treatment and are not experiencing a medical crisis that would typically require an emergency room visit.    [place], [place] [address] Phone: [phone] [email or web address]: Phone#: [phone]  The [facility] SA Intensive Outpatient Program (SAIOP) means structured individual and group addiction activities and services that are provided at an outpatient program designed to assist adult and adolescent consumers to begin recovery and learn skills for recovery maintenance. The [facility] program is offered at least 3 hours a day, 3 days a week.SAIOP services shall include a structured program consisting of, but not limited to, the following services: Individual counseling and support; Group counseling and support; Family counseling, training or support; Biochemical assays to identify recent drug use (e.g., urine drug screens); Strategies for relapse prevention to include community and social support systems in treatment; Life skills; Crisis contingency planning; Disease Management; and Treatment support activities that have been adapted or specifically designed for persons with physical disabilities, or persons with co-occurring disorders of mental illness and substance abuse/dependence or mental retardation/developmental disability and substance abuse/dependence. Phone: [phone]  Address:   The [facility] will also offer the following outpatient services: (Monday through Friday 8am-5pm)   Partial Hospitalization Program (PHP) Substance Abuse Intensive Outpatient Program (SA-IOP) Group Therapy Medication Management Peer Living Room We also provide (24/7):  Assessments: Our mental health clinician and providers will conduct a focused mental health evaluation, assessing for immediate safety concerns and further mental health needs. Referral: Our team will provide resources and help  connect to community based mental health treatment, when indicated, including psychotherapy, psychiatry, and other specialized behavioral health or substance use disorder services (for those not already in treatment). Transitional Care: Our team providers in person bridging and/or telephonic follow-up during the patient's transition to outpatient services.  The [facility] 24-Hour Call Center: [phone] Behavioral Health Crisis Line: [phone]

## 2020-12-12 NOTE — ED Notes (Signed)
Pt asleep in bed. Respirations even and unlabored. Will continue to monitor for safety. ?

## 2020-12-12 NOTE — ED Notes (Addendum)
Patient is is the dinning room having breakfast. No signs of respiratory distress noted. Will continue to monitor for safety.

## 2020-12-12 NOTE — ED Notes (Addendum)
Patient spent more time in the day room, actively participating in group and watching TV. Patient denies [place]. Interacting well with staff and with peers. No signs of respiratory distress noted. Will continue to monitor for safety.

## 2020-12-12 NOTE — ED Notes (Signed)
Patient is resting in bed with no signs of respiratory distress noted. Safety maintained.

## 2020-12-12 NOTE — ED Notes (Signed)
Patient actively participating in group. Cooperative and interacting well with staff. Will continue to monitor safety.

## 2020-12-12 NOTE — ED Notes (Signed)
Patient is actively participating in group. Patient interacting well with staff. No sign of respiratory distress noted. Will continue monitor for safety.

## 2020-12-13 DIAGNOSIS — F1994 Other psychoactive substance use, unspecified with psychoactive substance-induced mood disorder: Secondary | ICD-10-CM | POA: Diagnosis not present

## 2020-12-13 DIAGNOSIS — Z20822 Contact with and (suspected) exposure to covid-19: Secondary | ICD-10-CM | POA: Diagnosis not present

## 2020-12-13 DIAGNOSIS — Z79899 Other long term (current) drug therapy: Secondary | ICD-10-CM | POA: Diagnosis not present

## 2020-12-13 DIAGNOSIS — F10239 Alcohol dependence with withdrawal, unspecified: Secondary | ICD-10-CM | POA: Diagnosis not present

## 2020-12-13 MED ORDER — HYDROXYZINE HCL 25 MG PO TABS
25.0000 mg | ORAL_TABLET | Freq: Three times a day (TID) | ORAL | Status: DC | PRN
Start: 2020-12-13 — End: 2020-12-13
  Filled 2020-12-13: qty 15

## 2020-12-13 MED ORDER — HYDROXYZINE HCL 25 MG PO TABS: 25.0000 mg | ORAL_TABLET | Freq: Three times a day (TID) | ORAL | 0 refills | Status: AC | PRN

## 2020-12-13 NOTE — ED Notes (Signed)
Pt asleep in bed. Respirations even and unlabored. Will continue to monitor for safety. ?

## 2020-12-13 NOTE — ED Notes (Addendum)
Pt reporting increased anxiety, stating he had difficulty sleeping all night and is "worried about everything." Pt denies tremors or other withdrawal symptoms. CIWA 2. PRN Vistaril given per pt request. No signs of acute distress noted. Will continue to monitor for safety.

## 2020-12-13 NOTE — ED Notes (Signed)
Patient A&O x 4, ambulatory. Patient discharged in no acute distress. Patient denied SI/HI, A/VH upon discharge. Patient verbalized understanding of all discharge instructions explained by staff, to include follow up appointments, RX's and safety plan. Pt belongings returned to patient from locker # 27 intact. Patient escorted to lobby via staff for transport to home via pt's wife. Safety maintained.

## 2020-12-13 NOTE — ED Notes (Signed)
Pt laying in bed awake. Denies SI/HI/AVH. Pt reports increased anxiety about leaving today. Pt request medication for anxiety once discharged. Informed pt RN would notify MD of his request. Informed pt to practice learned coping skill while at facility and to utilize coping skills list in welcome folder. Pt verbalized agreement. Informed pt to notify staff with any needs or concerns. Safety maintained.

## 2021-06-01 ENCOUNTER — Other Ambulatory Visit: Payer: Self-pay

## 2021-06-01 ENCOUNTER — Emergency Department (HOSPITAL_BASED_OUTPATIENT_CLINIC_OR_DEPARTMENT_OTHER)
Admission: EM | Admit: 2021-06-01 | Discharge: 2021-06-01 | Disposition: A | Discharge status: Home / Self Care | Payer: BC Managed Care – PPO | Attending: Emergency Medicine | Admitting: Emergency Medicine

## 2021-06-01 ENCOUNTER — Emergency Department (HOSPITAL_BASED_OUTPATIENT_CLINIC_OR_DEPARTMENT_OTHER): Payer: BC Managed Care – PPO

## 2021-06-01 ENCOUNTER — Encounter (HOSPITAL_BASED_OUTPATIENT_CLINIC_OR_DEPARTMENT_OTHER): Payer: Self-pay | Admitting: Emergency Medicine

## 2021-06-01 DIAGNOSIS — S0590XA Unspecified injury of unspecified eye and orbit, initial encounter: Secondary | ICD-10-CM | POA: Insufficient documentation

## 2021-06-01 DIAGNOSIS — W01198A Fall on same level from slipping, tripping and stumbling with subsequent striking against other object, initial encounter: Secondary | ICD-10-CM | POA: Insufficient documentation

## 2021-06-01 DIAGNOSIS — H209 Unspecified iridocyclitis: Secondary | ICD-10-CM | POA: Insufficient documentation

## 2021-06-01 DIAGNOSIS — H5704 Mydriasis: Secondary | ICD-10-CM | POA: Insufficient documentation

## 2021-06-01 MED ORDER — TETRACAINE HCL 0.5 % OP SOLN
1.0000 [drp] | Freq: Once | OPHTHALMIC | Status: AC
  Administered 2021-06-01: 1 [drp] via OPHTHALMIC
  Filled 2021-06-01: qty 4

## 2021-06-01 MED ORDER — FLUORESCEIN SODIUM 1 MG OP STRP
1.0000 | ORAL_STRIP | Freq: Once | OPHTHALMIC | Status: AC
  Administered 2021-06-01: 1 via OPHTHALMIC
  Filled 2021-06-01: qty 1

## 2021-06-01 MED ORDER — TIMOLOL MALEATE 0.5 % OP SOLN
1.0000 [drp] | Freq: Once | OPHTHALMIC | Status: AC
  Administered 2021-06-01: 1 [drp] via OPHTHALMIC
  Filled 2021-06-01: qty 5

## 2021-06-01 MED ORDER — PREDNISOLONE ACETATE 1 % OP SUSP
1.0000 [drp] | Freq: Once | OPHTHALMIC | Status: AC
  Administered 2021-06-01: 1 [drp] via OPHTHALMIC
  Filled 2021-06-01: qty 5

## 2021-06-01 MED ORDER — OXYCODONE-ACETAMINOPHEN 5-325 MG PO TABS
1.0000 | ORAL_TABLET | Freq: Once | ORAL | Status: AC
  Administered 2021-06-01: 1 via ORAL
  Filled 2021-06-01: qty 1

## 2021-06-01 NOTE — Discharge Instructions (Addendum)
Take the Timolol drops 2 times a day (1 drop in the right eye) ?Take the Prednisolone Acetate drops 4 times a day (1 drop in the right eye) ? ?Use ibuprofen or tylenol as needed fpr pain. ?

## 2021-06-01 NOTE — ED Notes (Signed)
Pt transport to CT by the RN for stat CT orbits  ?

## 2021-06-01 NOTE — ED Notes (Signed)
Opthalmology at bedside.

## 2021-06-01 NOTE — Consult Note (Signed)
Chief Complaint/Reason for Consultation: Trauma and visual loss OD ? ?HPI: 51 yo with recent history of a fall in which he experienced blunt trauma from the corner of a chair to the OD and right periorbita. He notes immediately after the incident his vision went "completely black". He states his vision has been slowly coming back, though is still somewhat blurry. He denies flashes, floaters, or curtain over the vision. Denies any significant ocular history and denies any eye surgeries. ? ?ROS: otherwise as in HPI ? ?No current facility-administered medications on file prior to encounter.  ? ?Current Outpatient Medications on File Prior to Encounter  ?Medication Sig Dispense Refill  ? Multiple Vitamin (MULTIVITAMIN WITH MINERALS) TABS tablet Take 1 tablet by mouth daily. 30 tablet 0  ? traZODone (DESYREL) 100 MG tablet Take 1 tablet (100 mg total) by mouth at bedtime. 30 tablet 0  ?  ?Patient Active Problem List  ? Diagnosis Date Noted  ? Alcohol withdrawal ([place]) 12/12/2020  ? Substance induced mood disorder ([place]) 12/09/2020  ? Hypokalemia 08/15/2012  ? Hypomagnesemia 08/15/2012  ? Other and unspecified hyperlipidemia 08/15/2012  ? Encephalopathy acute 08/09/2012  ? Alcohol withdrawal with delirium ([place]) 08/08/2012  ? Portal hypertension ([place]) 08/08/2012  ? Splenomegaly 08/08/2012  ? Alcoholic hepatitis without ascites 08/08/2012  ? Hiatal hernia 08/08/2012  ? Alcohol abuse 08/06/2012  ? Elevated blood alcohol level 08/06/2012  ? Acute pancreatitis 08/06/2012  ? Cirrhosis of liver ([place]) 08/06/2012  ? Thrombocytopenia ([place]) 08/06/2012  ? Hypertension   ? Anxiety   ? DVT (deep venous thrombosis) ([place])   ? No Known Allergies  ?Family History  ?Problem Relation Age of Onset  ? Hypertension Father   ?  ? ? ?EXAMINATION ? ?VAsc (near with +2.5D lens): ?OD: 20/25-1 ?OS: 20/20 ? ?Pupils:  ?OD: 6.93m, round, non-reactive, no APD (by reverse testing) ?OS: 368m round, reactive, no APD ? ?T(Pen): ?OD:  27, 22, 28  mm Hg ?OS:  11   mm Hg ? ?CVF: full to finger counting OU ?EOM: full OU, no limitation or restriction OU ? ?Slit lamp Exam: ?Ext/Lids: R: moderate ecchymosis inferior periorbita, L: normal ?Conj/Sclera: OD trace injection, no scleral laceration, OS white and quiet  ?Cornea: clear OU, no abrasion or infiltrate OU, no laceration OU ?AC: OD 4+ pigmented cells, no significant hyphema, OS Deep and Quiet  ?Iris: OD traumatic mydriasis to 6.72m58mposterior iris pigment epithelium cyst/schisis inferiorly, OS round and flat ?Lens: OD Clear with intact lens capsule, +pigment on anterior and posterior lens capsule, OS clear ? ?Dilated OU with phenylephrine and tropicamide OU 9:28 pm ? ?Dilated Fundus Exam (hazy view OD): ?Vitreous: OD hazy, mild vitreous hemorrhage vs pigment/debris, OS Clear  ?Disc: sharp and pink with 0.5 c/d OU ?Macula: flat and dry OU ?Vessels: normal distribution OU, perfused OU ?Periphery: flat and attached 360 without breaks or tears OU, no commotio OU, ? ? ?Labs/Imaging: CT maxillofacial without any orbital fractures or other noted injuries. ? ? ?Imp/Plan: ? 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 ?Dispo: OP f/u Monday @ 1:45PM ? ?Lonia Skinner, M.D. ?Ophthalmology ?[place] ? ?

## 2021-06-01 NOTE — ED Notes (Signed)
Eye exam supplies at bedside.  

## 2021-06-01 NOTE — ED Provider Notes (Signed)
?[facility] EMERGENC[facility] DEPT ?Provider Note ? ? ?CSN: [id number] ?Arrival date & time: 06/01/21  1916 ? ?  ? ?History ? ?Chief Complaint  ?Patient presents with  ?? Eye Injury  ? ? ?[name] is a 51 y.o. male. ? ?Patient is a 51 year old male with no known medical problems who presents today due to an injury to his right eye.  He was walking past his [place] and tripped falling hitting his right eye directly on a wooden arm of the chair.  He reports the soonest that happened his vision went black and he feels like there is a bubble in his eyes.  He reports he still does not have any vision in that eye and it is uncomfortable.  He is also having some mild pain in the lower part of his face beneath the eye.  He does not have any history of eye issues in the past he does not see an ophthalmologist.  He did not have any loss of consciousness denies any neck pain or injury anywhere else.  He does not take any anticoagulation. ? ?The history is provided by the patient.  ?Eye Injury ? ? ?  ? ?Home Medications ?Prior to Admission medications   ?Medication Sig Start Date End Date Taking? Authorizing Provider  ?Multiple Vitamin (MULTIVITAMIN WITH MINERALS) TABS tablet Take 1 tablet by mouth daily. 12/13/20   Ival Bible, MD  ?traZODone (DESYREL) 100 MG tablet Take 1 tablet (100 mg total) by mouth at bedtime. 12/12/20 01/11/21  Ival Bible, MD  ?   ? ?Allergies    ?Patient has no known allergies.   ? ?Review of Systems   ?Review of Systems ? ?Physical Exam ?Updated Vital Signs ?BP 127/80   Pulse 91   Temp (!) 97.3 ?F (36.3 ?C)   Resp 16   Ht '5\' 7"'$  (1.702 m)   Wt 74.8 kg   SpO2 99%   BMI 25.84 kg/m?  ?Physical Exam ?Vitals and nursing note reviewed.  ?HENT:  ?   Head: Normocephalic.  ?Eyes:  ?   General: Lids are normal.  ?   Comments: Only able to make out light and dark in the right eye.  Left eye is grossly intact.  The right pupil is 5 mm and nonreactive.  There is no  consensual light reflex in the left eye.  Extraocular movements are intact.  Bruising noted to the infraorbital area.  Mild conjunctival injection.  IOP is 24  ?Cardiovascular:  ?   Rate and Rhythm: Normal rate.  ?Pulmonary:  ?   Effort: Pulmonary effort is normal.  ?Musculoskeletal:  ?   Cervical back: Normal range of motion.  ?Skin: ?   General: Skin is warm and dry.  ?Neurological:  ?   Mental Status: He is alert. Mental status is at baseline.  ?Psychiatric:     ?   Mood and Affect: Mood normal.     ?   Behavior: Behavior normal.  ? ? ?ED Results / Procedures / Treatments   ?Labs ?(all labs ordered are listed, but only abnormal results are displayed) ?Labs Reviewed - No data to display ? ?EKG ?None ? ?Radiology ?CT Orbits Wo Contrast ? ?Result Date: 06/01/2021 ?CLINICAL DATA:  Pt fell over his dog hitting his right eye on a chair, pt c/o pain in right eye, no outwardly visible marks to area. Orbital trauma no vision concern for open globe EXAM: CT ORBITS WITHOUT CONTRAST TECHNIQUE: Multidetector CT imaging of the  orbits was performed using the standard protocol without intravenous contrast. Multiplanar CT image reconstructions were also generated. RADIATION DOSE REDUCTION: This exam was performed according to the departmental dose-optimization program which includes automated exposure control, adjustment of the mA and/or kV according to patient size and/or use of iterative reconstruction technique. COMPARISON:  CT head 05/09/2013 FINDINGS: Orbits: No orbital mass or evidence of inflammation. Normal appearance of the globes, optic nerve-sheath complexes, extraocular muscles, orbital fat and lacrimal glands. Visible paranasal sinuses: Clear. Soft tissues: Normal. Osseous: No fracture or aggressive lesion. Limited intracranial: No acute or significant finding. Atherosclerotic calcifications are present within the cavernous internal carotid and vertebral arteries. IMPRESSION: Negative for acute traumatic injury.  Electronically Signed   By: Iven Finn M.D.   On: 06/01/2021 19:59   ? ?Procedures ?Procedures  ? ? ?Medications Ordered in ED ?Medications  ?timolol (TIMOPTIC) 0.5 % ophthalmic solution 1 drop (has no administration in time range)  ?prednisoLONE acetate (PRED FORTE) 1 % ophthalmic suspension 1 drop (has no administration in time range)  ?fluorescein ophthalmic strip 1 strip (1 strip Right Eye Given 06/01/21 1947)  ?tetracaine (PONTOCAINE) 0.5 % ophthalmic solution 1 drop (1 drop Right Eye Given 06/01/21 1947)  ?oxyCODONE-acetaminophen (PERCOCET/ROXICET) 5-325 MG per tablet 1 tablet (1 tablet Oral Given 06/01/21 2012)  ? ? ?ED Course/ Medical Decision Making/ A&P ?  ?                        ?Medical Decision Making ?Amount and/or Complexity of Data Reviewed ?Radiology: ordered. ? ?Risk ?Prescription drug management. ? ? ?Healthy 51 year old male presenting today with concern for possible open globe versus other traumatic eye injury.  Patient has a nonreactive 5 mm pupil that is slightly deformed and minimal vision.  This occurred after hitting his eye directly on the wooden arm of a chair.  Patient has no prior history does not use contacts or need corrective lenses of any kind.  He does not take any anticoagulation no further eye exam was performed until a orbital CT is done to ensure no evidence of open globe.  On visual inspection no layering hyphema but may be complete hyphema. ?10:07 PM ?I independently visualized and interpreted orbital CT without acute findings.  Radiology reported that the CT was negative.  No evidence of open globe at this time.  Patient is requesting pain medication. ?No fluorescein uptake.  Will discuss with ophthalmology. Dr. Eulas Post will come seethe pt here. ?Pt IOP has increased now to 28 and will start timolol drops bid and pt has traumatic iritis and mydriasis and will use prednisolone drops 4 times a day and has follow up with Dr. Eulas Post on Monday.  ? ? ? ?  ? ? ? ? ?Final Clinical  Impression(s) / ED Diagnoses ?Final diagnoses:  ?Traumatic iritis  ?Traumatic mydriasis  ? ? ?Rx / DC Orders ?ED Discharge Orders   ? ? None  ? ?  ? ? ?  ?Blanchie Dessert, MD ?06/01/21 2319 ? ?

## 2021-06-01 NOTE — ED Notes (Signed)
Discharge instructions including recommended eye drops and follow up care discussed by pt. Pt verbalized understanding with no questions at this time. Pt to follow up with opthalmology on Monday. Spouse at bedside during discharge. Pt to go home with spouse.  ?

## 2021-06-01 NOTE — ED Triage Notes (Signed)
Pt via pov from home after a fall where he hit his eye on a chair arm. Pt states he can't see much out of the right eye, and that everything is blurry. Pt alert & oriented, nad noted.  ?

## 2021-09-23 ENCOUNTER — Other Ambulatory Visit: Payer: Self-pay

## 2021-09-23 ENCOUNTER — Emergency Department (HOSPITAL_BASED_OUTPATIENT_CLINIC_OR_DEPARTMENT_OTHER)
Admission: EM | Admit: 2021-09-23 | Discharge: 2021-09-23 | Disposition: A | Discharge status: Home / Self Care | Payer: BC Managed Care – PPO | Attending: Emergency Medicine | Admitting: Emergency Medicine

## 2021-09-23 ENCOUNTER — Encounter (HOSPITAL_BASED_OUTPATIENT_CLINIC_OR_DEPARTMENT_OTHER): Payer: Self-pay

## 2021-09-23 DIAGNOSIS — F1020 Alcohol dependence, uncomplicated: Secondary | ICD-10-CM

## 2021-09-23 DIAGNOSIS — Y908 Blood alcohol level of 240 mg/100 ml or more: Secondary | ICD-10-CM | POA: Insufficient documentation

## 2021-09-23 DIAGNOSIS — Z79899 Other long term (current) drug therapy: Secondary | ICD-10-CM | POA: Insufficient documentation

## 2021-09-23 DIAGNOSIS — F10920 Alcohol use, unspecified with intoxication, uncomplicated: Secondary | ICD-10-CM

## 2021-09-23 DIAGNOSIS — F10120 Alcohol abuse with intoxication, uncomplicated: Secondary | ICD-10-CM | POA: Insufficient documentation

## 2021-09-23 DIAGNOSIS — R7401 Elevation of levels of liver transaminase levels: Secondary | ICD-10-CM | POA: Insufficient documentation

## 2021-09-23 DIAGNOSIS — I498 Other specified cardiac arrhythmias: Secondary | ICD-10-CM

## 2021-09-23 DIAGNOSIS — R008 Other abnormalities of heart beat: Secondary | ICD-10-CM | POA: Insufficient documentation

## 2021-09-23 DIAGNOSIS — E119 Type 2 diabetes mellitus without complications: Secondary | ICD-10-CM

## 2021-09-23 DIAGNOSIS — D696 Thrombocytopenia, unspecified: Secondary | ICD-10-CM

## 2021-09-23 LAB — COMPREHENSIVE METABOLIC PANEL
ALT: 166 U/L — ABNORMAL HIGH (ref 0–44)
AST: 293 U/L — ABNORMAL HIGH (ref 15–41)
Albumin: 4.5 g/dL (ref 3.5–5.0)
Alkaline Phosphatase: 85 U/L (ref 38–126)
Anion gap: 13 (ref 5–15)
BUN: <5 mg/dL — ABNORMAL LOW (ref 6–20)
CO2: 24 mmol/L (ref 22–32)
Calcium: 9.3 mg/dL (ref 8.9–10.3)
Chloride: 99 mmol/L (ref 98–111)
Creatinine, Ser: 0.58 mg/dL — ABNORMAL LOW (ref 0.61–1.24)
GFR, Estimated: >60 mL/min (ref >60)
Glucose, Bld: 205 mg/dL — ABNORMAL HIGH (ref 70–99)
Potassium: 3.7 mmol/L (ref 3.5–5.1)
Sodium: 136 mmol/L (ref 135–145)
Total Bilirubin: 1.8 mg/dL — ABNORMAL HIGH (ref 0.3–1.2)
Total Protein: 7.6 g/dL (ref 6.5–8.1)

## 2021-09-23 LAB — CBC
HCT: 43.7 % (ref 39.0–52.0)
Hemoglobin: 14.3 g/dL (ref 13.0–17.0)
MCH: 25.7 pg — ABNORMAL LOW (ref 26.0–34.0)
MCHC: 32.7 g/dL (ref 30.0–36.0)
MCV: 78.6 fL — ABNORMAL LOW (ref 80.0–100.0)
Platelets: 21 10*3/uL — CL (ref 150–400)
RBC: 5.56 MIL/uL (ref 4.22–5.81)
RDW: 16.1 % — ABNORMAL HIGH (ref 11.5–15.5)
WBC: 2.9 10*3/uL — ABNORMAL LOW (ref 4.0–10.5)
nRBC: 0 % (ref 0.0–0.2)

## 2021-09-23 LAB — RAPID URINE DRUG SCREEN, HOSP PERFORMED
Amphetamines: NOT DETECTED
Barbiturates: NOT DETECTED
Benzodiazepines: POSITIVE — AB
Cocaine: NOT DETECTED
Opiates: NOT DETECTED
Tetrahydrocannabinol: POSITIVE — AB

## 2021-09-23 LAB — CBG MONITORING, ED: Glucose-Capillary: 218 mg/dL — ABNORMAL HIGH (ref 70–99)

## 2021-09-23 LAB — ETHANOL: Alcohol, Ethyl (B): 390 mg/dL (ref <10)

## 2021-09-23 MED ORDER — CHLORDIAZEPOXIDE HCL 25 MG PO CAPS: ORAL_CAPSULE | ORAL | 0 refills | Status: DC

## 2021-09-23 MED ORDER — FOLIC ACID 1 MG PO TABS: 1.0000 mg | ORAL_TABLET | Freq: Every day | ORAL | 0 refills | Status: DC

## 2021-09-23 MED ORDER — METFORMIN HCL 500 MG PO TABS: 500.0000 mg | ORAL_TABLET | Freq: Two times a day (BID) | ORAL | 0 refills | Status: DC

## 2021-09-23 MED ORDER — CHLORDIAZEPOXIDE HCL 25 MG PO CAPS
50.0000 mg | ORAL_CAPSULE | Freq: Once | ORAL | Status: AC
  Administered 2021-09-23: 50 mg via ORAL
  Filled 2021-09-23: qty 2

## 2021-09-23 MED ORDER — METFORMIN HCL 500 MG PO TABS
500.0000 mg | ORAL_TABLET | Freq: Once | ORAL | Status: AC
  Administered 2021-09-23: 500 mg via ORAL
  Filled 2021-09-23: qty 1

## 2021-09-23 NOTE — ED Notes (Signed)
Pt admits to ETOH abuse, states he has been drinking a beer every hr for the past few months.  Prior to that sober since September

## 2021-09-23 NOTE — ED Provider Notes (Addendum)
[facility] EMERGENCY DEPT Provider Note   CSN: [id number] Arrival date & time: 09/23/21  1333     History  Chief Complaint  Patient presents with   Alcohol Problem    [name] is a 51 y.o. male.  Presenting to the emergency department due to concern for alcohol abuse.  Patient states that up until a few months ago he was sober.  Had a prior history of alcohol abuse but was sober for many years.  Drinks on a daily basis.  Patient's mother and family friend at bedside states that patient is very interested in getting help and they are interested in helping him.  Patient states that when he tries to stop drinking he feels generally unwell, develops nausea and vomiting, at times feels short of breath.  He denies any episodes of passing out or seizure activity or anxiety.  Earlier he had endorsed having pain all over.  When I assessed patient he denied any pain.  He tried going to [facility] a couple days ago however his blood sugar was elevated and they said they could not take him.  He is unsure if he has diabetes.  Completed chart review, behavioral health urgent care visit in September 2022 was reviewed.  Blood work at that time was noted for thrombocytopenia, elevated A1c at 7.4, blood alcohol level 0.39  HPI     Home Medications Prior to Admission medications   Medication Sig Start Date End Date Taking? Authorizing Provider  chlordiazePOXIDE (LIBRIUM) 25 MG capsule '50mg'$  PO TID x 1D, then 25-'50mg'$  PO BID X 1D, then 25-'50mg'$  PO QD X 1D 09/23/21  Yes Mikel Hardgrove, Ellwood Dense, MD  folic acid (FOLVITE) 1 MG tablet Take 1 tablet (1 mg total) by mouth daily. 09/23/21  Yes Lucrezia Starch, MD  metFORMIN (GLUCOPHAGE) 500 MG tablet Take 1 tablet (500 mg total) by mouth 2 (two) times daily with a meal. 09/23/21  Yes Abbigaile Rockman, Ellwood Dense, MD  Multiple Vitamin (MULTIVITAMIN WITH MINERALS) TABS tablet Take 1 tablet by mouth daily. 12/13/20   Ival Bible, MD  traZODone (DESYREL) 100  MG tablet Take 1 tablet (100 mg total) by mouth at bedtime. 12/12/20 01/11/21  Ival Bible, MD      Allergies    Patient has no known allergies.    Review of Systems   Review of Systems  Constitutional:  Positive for fatigue. Negative for chills and fever.  HENT:  Negative for ear pain and sore throat.   Eyes:  Negative for pain and visual disturbance.  Respiratory:  Positive for shortness of breath. Negative for cough.   Cardiovascular:  Negative for chest pain and palpitations.  Gastrointestinal:  Positive for nausea and vomiting. Negative for abdominal pain.  Genitourinary:  Negative for dysuria and hematuria.  Musculoskeletal:  Negative for arthralgias and back pain.  Skin:  Negative for color change and rash.  Neurological:  Negative for seizures and syncope.  All other systems reviewed and are negative.   Physical Exam Updated Vital Signs BP (!) 152/98   Pulse (!) 49   Temp 98.7 F (37.1 C)   Resp 12   Ht '5\' 7"'$  (1.702 m)   Wt 74.8 kg   SpO2 96%   BMI 25.83 kg/m  Physical Exam Vitals and nursing note reviewed.  Constitutional:      General: He is not in acute distress.    Appearance: He is well-developed.  HENT:     Head: Normocephalic and atraumatic.  Eyes:  Conjunctiva/sclera: Conjunctivae normal.  Cardiovascular:     Rate and Rhythm: Normal rate and regular rhythm.     Pulses: Normal pulses.     Heart sounds: No murmur heard. Pulmonary:     Effort: Pulmonary effort is normal. No respiratory distress.     Breath sounds: Normal breath sounds.  Abdominal:     Palpations: Abdomen is soft.     Tenderness: There is no abdominal tenderness.  Musculoskeletal:        General: No swelling.     Cervical back: Neck supple.  Skin:    General: Skin is warm and dry.     Capillary Refill: Capillary refill takes less than 2 seconds.  Neurological:     General: No focal deficit present.     Mental Status: He is alert. He is disoriented.  Psychiatric:         Mood and Affect: Mood normal.     ED Results / Procedures / Treatments   Labs (all labs ordered are listed, but only abnormal results are displayed) Labs Reviewed  COMPREHENSIVE METABOLIC PANEL - Abnormal; Notable for the following components:      Result Value   Glucose, Bld 205 (*)    BUN <5 (*)    Creatinine, Ser 0.58 (*)    AST 293 (*)    ALT 166 (*)    Total Bilirubin 1.8 (*)    All other components within normal limits  ETHANOL - Abnormal; Notable for the following components:   Alcohol, Ethyl (B) 390 (*)    All other components within normal limits  CBC - Abnormal; Notable for the following components:   WBC 2.9 (*)    MCV 78.6 (*)    MCH 25.7 (*)    RDW 16.1 (*)    Platelets 21 (*)    All other components within normal limits  CBG MONITORING, ED - Abnormal; Notable for the following components:   Glucose-Capillary 218 (*)    All other components within normal limits  RAPID URINE DRUG SCREEN, HOSP PERFORMED    EKG EKG Interpretation  Date/Time:  Monday September 23 2021 16:51:26 EDT Ventricular Rate:  98 PR Interval:  147 QRS Duration: 99 QT Interval:  367 QTC Calculation: 421 R Axis:   45 Text Interpretation: Sinus rhythm Ventricular bigeminy Nonspecific T abnrm, anterolateral leads Confirmed by Madalyn Rob [id number]) on 09/23/2021 4:57:43 PM  Radiology No results found.  Procedures Procedures    Medications Ordered in ED Medications  chlordiazePOXIDE (LIBRIUM) capsule 50 mg (50 mg Oral Given 09/23/21 1635)  metFORMIN (GLUCOPHAGE) tablet 500 mg (500 mg Oral Given 09/23/21 1635)    ED Course/ Medical Decision Making/ A&P                           Medical Decision Making Amount and/or Complexity of Data Reviewed Labs: ordered.  Risk Prescription drug management.   51 year old gentleman presenting to the ER due to concern for alcohol abuse.  He initially had endorsed having pain all over.  When I evaluated patient however he denied any sort of  ongoing pain.  He currently does not have any signs or symptoms consistent with withdrawal.  His blood alcohol level currently is 0.39.  On review of his labs I also noted thrombocytopenia and elevated blood sugar and elevated LFTs.  The AST to ALT ratio is consistent with transaminitis from alcohol abuse.  Per review of his chart from last year he also  had a similar level of thrombocytopenia.  Also from his chronic alcohol abuse.  On cardiac monitor patient was noted to be in ventricular bigeminy.  EKG confirmed.  Given patient does not have any ongoing symptoms, vital signs are stable, I do not feel he needs admission to the hospital at this time.  For his multitude of medical problems I strongly recommend getting plugged into a primary care doctor.  I consulted case management who will help make these arrangements.  For now, will give patient Rx for metformin, folate supplement.  Patient is adamant that he is interested in quitting drinking and has family support at bedside.  Will give Rx for Librium taper.  I encouraged him to also seek help with local substance abuse resources.  Additionally provided patient with resource guide.  Lengthy discussion both with patient and family regarding return precautions should he develop signs and symptoms concerning for alcohol withdrawal syndrome.  Given patient appears to be asymptomatic from his ventricular bigeminy, do not feel he needs any emergent work-up or further testing for this at this point.  Per review of chart this appears to be new issue, will advise he follow-up with cardiology for this finding.  I also encouraged patient to have his primary care doctor monitor levels and LFTs and discuss potential referrals as they deem necessary.  After the discussed management above, the patient was determined to be safe for discharge.  The patient was in agreement with this plan and all questions regarding their care were answered.  ED return precautions were  discussed and the patient will return to the ED with any significant worsening of condition.         Final Clinical Impression(s) / ED Diagnoses Final diagnoses:  Thrombocytopenia ([place])  Alcoholism ([place])  Alcoholic intoxication without complication ([facility])  Transaminitis  Type 2 diabetes mellitus without complication, without long-term current use of insulin ([place])  Bigeminy    Rx / DC Orders ED Discharge Orders          Ordered    chlordiazePOXIDE (LIBRIUM) 25 MG capsule        09/23/21 1651    metFORMIN (GLUCOPHAGE) 500 MG tablet  2 times daily with meals        03/55/97 4163    folic acid (FOLVITE) 1 MG tablet  Daily        09/23/21 1651    Ambulatory referral to Cardiology        09/23/21 1656              Lucrezia Starch, MD 09/23/21 1705    Lucrezia Starch, MD 09/23/21 [phone]

## 2021-09-23 NOTE — ED Triage Notes (Signed)
Patient here POV from Home.  Endorses being Sober for 9 Months but for the Past 3 Months the Patient has been drinking approximately 1 Beer every Hour. Last Drink was approximately 1 Hour ago.  Emesis a few days PTA. States CBG was 300 then. No Diarrhea. Endorses Pain from "Head to Toe"  NAD Noted during Triage. A&Ox4. GCS 15. BIB Wheelchair.

## 2021-09-23 NOTE — ED Notes (Signed)
Staff requested to Speak with Patient regarding Wait Time. Patient and Family made aware there are no appropriate Examination Room available at this Time for Assessment. Family states Patient would like to leave and that they are struggling to convince Patient to stay. Family informed that Patient cannot be forced to stay by Staff at this Time.

## 2021-09-23 NOTE — Discharge Instructions (Addendum)
On your blood work today, your platelets were low, your liver enzymes were mildly elevated.  Both of these findings are directly related to your alcohol abuse.  The best solution is stopping alcohol.  I would strongly advise discussing this further with your primary care doctor and having these further monitored.   Your cardiac monitor today showed periods of bigeminy. Please discuss this further with a cardiologist.   Please take the librium taper as discussed. You should only take this if you are serious about quitting drinking. I would strongly advise also consider either in patient or outpatient substance abuse treatment centers in the area.   If you have any episodes of passing out, significant anxiety, vomiting, seizure like activity, shaking or other new concerning symptoms, come back immediately to ER for reassessment.

## 2021-10-17 ENCOUNTER — Encounter: Payer: Self-pay | Admitting: *Deleted

## 2021-11-19 ENCOUNTER — Telehealth: Payer: Self-pay

## 2021-11-19 NOTE — Telephone Encounter (Signed)
Opened chart In error.

## 2021-12-23 ENCOUNTER — Encounter (HOSPITAL_COMMUNITY): Payer: Self-pay

## 2021-12-23 ENCOUNTER — Emergency Department (HOSPITAL_COMMUNITY)
Admission: EM | Admit: 2021-12-23 | Discharge: 2021-12-23 | Discharge status: Against Medical Advice | Payer: Self-pay | Attending: Emergency Medicine | Admitting: Emergency Medicine

## 2021-12-23 DIAGNOSIS — R109 Unspecified abdominal pain: Secondary | ICD-10-CM | POA: Insufficient documentation

## 2021-12-23 DIAGNOSIS — R112 Nausea with vomiting, unspecified: Secondary | ICD-10-CM | POA: Insufficient documentation

## 2021-12-23 DIAGNOSIS — Z5321 Procedure and treatment not carried out due to patient leaving prior to being seen by health care provider: Secondary | ICD-10-CM | POA: Insufficient documentation

## 2021-12-23 LAB — CBC WITH DIFFERENTIAL/PLATELET
Abs Immature Granulocytes: 0.05 10*3/uL (ref 0.00–0.07)
Basophils Absolute: 0 10*3/uL (ref 0.0–0.1)
Basophils Relative: 0 %
Eosinophils Absolute: 0 10*3/uL (ref 0.0–0.5)
Eosinophils Relative: 0 %
HCT: 39 % (ref 39.0–52.0)
Hemoglobin: 13.4 g/dL (ref 13.0–17.0)
Immature Granulocytes: 1 %
Lymphocytes Relative: 7 %
Lymphs Abs: 0.5 10*3/uL — ABNORMAL LOW (ref 0.7–4.0)
MCH: 26.5 pg (ref 26.0–34.0)
MCHC: 34.4 g/dL (ref 30.0–36.0)
MCV: 77.2 fL — ABNORMAL LOW (ref 80.0–100.0)
Monocytes Absolute: 0.4 10*3/uL (ref 0.1–1.0)
Monocytes Relative: 6 %
Neutro Abs: 5.7 10*3/uL (ref 1.7–7.7)
Neutrophils Relative %: 86 %
Platelets: 39 10*3/uL — ABNORMAL LOW (ref 150–400)
RBC: 5.05 MIL/uL (ref 4.22–5.81)
RDW: 16.7 % — ABNORMAL HIGH (ref 11.5–15.5)
WBC: 6.7 10*3/uL (ref 4.0–10.5)
nRBC: 0 % (ref 0.0–0.2)

## 2021-12-23 LAB — COMPREHENSIVE METABOLIC PANEL
ALT: 161 U/L — ABNORMAL HIGH (ref 0–44)
AST: 192 U/L — ABNORMAL HIGH (ref 15–41)
Albumin: 4.2 g/dL (ref 3.5–5.0)
Alkaline Phosphatase: 60 U/L (ref 38–126)
Anion gap: 21 — ABNORMAL HIGH (ref 5–15)
BUN: 12 mg/dL (ref 6–20)
CO2: 18 mmol/L — ABNORMAL LOW (ref 22–32)
Calcium: 8.8 mg/dL — ABNORMAL LOW (ref 8.9–10.3)
Chloride: 95 mmol/L — ABNORMAL LOW (ref 98–111)
Creatinine, Ser: 0.54 mg/dL — ABNORMAL LOW (ref 0.61–1.24)
GFR, Estimated: >60 mL/min (ref >60)
Glucose, Bld: 163 mg/dL — ABNORMAL HIGH (ref 70–99)
Potassium: 3.6 mmol/L (ref 3.5–5.1)
Sodium: 134 mmol/L — ABNORMAL LOW (ref 135–145)
Total Bilirubin: 2.4 mg/dL — ABNORMAL HIGH (ref 0.3–1.2)
Total Protein: 7.2 g/dL (ref 6.5–8.1)

## 2021-12-23 LAB — LIPASE, BLOOD: Lipase: 33 U/L (ref 11–51)

## 2021-12-23 MED ORDER — ONDANSETRON 8 MG PO TBDP
8.0000 mg | ORAL_TABLET | Freq: Once | ORAL | Status: AC
  Administered 2021-12-23: 8 mg via ORAL
  Filled 2021-12-23: qty 1

## 2021-12-23 MED ORDER — SODIUM CHLORIDE 0.9 % IV BOLUS: 1000.0000 mL | Freq: Once | INTRAVENOUS | Status: DC

## 2021-12-23 MED ORDER — ACETAMINOPHEN 500 MG PO TABS
1000.0000 mg | ORAL_TABLET | Freq: Once | ORAL | Status: AC
Start: 2021-12-23 — End: 2021-12-23
  Administered 2021-12-23: 1000 mg via ORAL
  Filled 2021-12-23: qty 2

## 2021-12-23 NOTE — ED Triage Notes (Signed)
Pt arrived via [facility] c/o "black" vomiting, and abd pain.

## 2021-12-23 NOTE — ED Notes (Incomplete)
Patient

## 2021-12-23 NOTE — ED Provider Triage Note (Signed)
Emergency Medicine Provider Triage Evaluation Note  Revanth Neidig , a 51 y.o. male  was evaluated in triage.  Pt complains of abdominal pain, nausea, vomiting, onset last night, hx pancreatitis, feels same, last had beer this morning. Emesis is coffee ground, no changes in bowel or bladder habits. No fever  Review of Systems  Positive: As above Negative: As above  Physical Exam  BP (!) 154/102 (BP Location: Left Arm)   Pulse (!) 130   Temp 98.3 F (36.8 C) (Oral)   Resp 18   SpO2 97%  Gen:   Awake, appears to be in pain- rocking   Resp:  Normal effort  MSK:   Moves extremities without difficulty  Other:    Medical Decision Making  Medically screening exam initiated at 11:26 AM.  Appropriate orders placed.  Octavis Sheeler [facility] was informed that the remainder of the evaluation will be completed by another provider, this initial triage assessment does not replace that evaluation, and the importance of remaining in the ED until their evaluation is complete.     Tacy Learn, PA-C 12/23/21 1127

## 2021-12-23 NOTE — ED Notes (Signed)
Patient left about a hour and a half ago

## 2021-12-24 ENCOUNTER — Encounter (HOSPITAL_BASED_OUTPATIENT_CLINIC_OR_DEPARTMENT_OTHER): Payer: Self-pay | Admitting: Pediatrics

## 2021-12-24 ENCOUNTER — Encounter (HOSPITAL_COMMUNITY): Payer: Self-pay

## 2021-12-24 ENCOUNTER — Inpatient Hospital Stay (HOSPITAL_BASED_OUTPATIENT_CLINIC_OR_DEPARTMENT_OTHER)
Admission: EM | Admit: 2021-12-24 | Discharge: 2021-12-28 | DRG: 432 | Disposition: A | Discharge status: Home / Self Care | Payer: Self-pay | Attending: Internal Medicine | Admitting: Internal Medicine

## 2021-12-24 ENCOUNTER — Emergency Department (HOSPITAL_BASED_OUTPATIENT_CLINIC_OR_DEPARTMENT_OTHER): Payer: Self-pay

## 2021-12-24 ENCOUNTER — Other Ambulatory Visit: Payer: Self-pay

## 2021-12-24 DIAGNOSIS — K766 Portal hypertension: Secondary | ICD-10-CM | POA: Diagnosis present

## 2021-12-24 DIAGNOSIS — E119 Type 2 diabetes mellitus without complications: Secondary | ICD-10-CM | POA: Diagnosis present

## 2021-12-24 DIAGNOSIS — R161 Splenomegaly, not elsewhere classified: Secondary | ICD-10-CM | POA: Diagnosis present

## 2021-12-24 DIAGNOSIS — K922 Gastrointestinal hemorrhage, unspecified: Secondary | ICD-10-CM | POA: Diagnosis present

## 2021-12-24 DIAGNOSIS — Z7984 Long term (current) use of oral hypoglycemic drugs: Secondary | ICD-10-CM

## 2021-12-24 DIAGNOSIS — I10 Essential (primary) hypertension: Secondary | ICD-10-CM | POA: Diagnosis present

## 2021-12-24 DIAGNOSIS — K7460 Unspecified cirrhosis of liver: Secondary | ICD-10-CM | POA: Diagnosis present

## 2021-12-24 DIAGNOSIS — K920 Hematemesis: Secondary | ICD-10-CM

## 2021-12-24 DIAGNOSIS — C220 Liver cell carcinoma: Secondary | ICD-10-CM | POA: Diagnosis present

## 2021-12-24 DIAGNOSIS — F1010 Alcohol abuse, uncomplicated: Secondary | ICD-10-CM | POA: Diagnosis present

## 2021-12-24 DIAGNOSIS — Y906 Blood alcohol level of 120-199 mg/100 ml: Secondary | ICD-10-CM | POA: Diagnosis not present

## 2021-12-24 DIAGNOSIS — R7989 Other specified abnormal findings of blood chemistry: Secondary | ICD-10-CM | POA: Diagnosis present

## 2021-12-24 DIAGNOSIS — D6959 Other secondary thrombocytopenia: Secondary | ICD-10-CM | POA: Diagnosis present

## 2021-12-24 DIAGNOSIS — D62 Acute posthemorrhagic anemia: Secondary | ICD-10-CM | POA: Diagnosis present

## 2021-12-24 DIAGNOSIS — D696 Thrombocytopenia, unspecified: Secondary | ICD-10-CM | POA: Diagnosis present

## 2021-12-24 DIAGNOSIS — K3189 Other diseases of stomach and duodenum: Secondary | ICD-10-CM | POA: Diagnosis present

## 2021-12-24 DIAGNOSIS — E876 Hypokalemia: Secondary | ICD-10-CM | POA: Diagnosis not present

## 2021-12-24 DIAGNOSIS — K7030 Alcoholic cirrhosis of liver without ascites: Principal | ICD-10-CM | POA: Diagnosis present

## 2021-12-24 DIAGNOSIS — Z86718 Personal history of other venous thrombosis and embolism: Secondary | ICD-10-CM

## 2021-12-24 DIAGNOSIS — I8511 Secondary esophageal varices with bleeding: Secondary | ICD-10-CM | POA: Diagnosis present

## 2021-12-24 DIAGNOSIS — Z8249 Family history of ischemic heart disease and other diseases of the circulatory system: Secondary | ICD-10-CM

## 2021-12-24 DIAGNOSIS — Z79899 Other long term (current) drug therapy: Secondary | ICD-10-CM

## 2021-12-24 DIAGNOSIS — F10230 Alcohol dependence with withdrawal, uncomplicated: Secondary | ICD-10-CM | POA: Diagnosis not present

## 2021-12-24 DIAGNOSIS — E118 Type 2 diabetes mellitus with unspecified complications: Secondary | ICD-10-CM

## 2021-12-24 DIAGNOSIS — K760 Fatty (change of) liver, not elsewhere classified: Secondary | ICD-10-CM | POA: Diagnosis present

## 2021-12-24 DIAGNOSIS — Z23 Encounter for immunization: Secondary | ICD-10-CM

## 2021-12-24 DIAGNOSIS — R160 Hepatomegaly, not elsewhere classified: Secondary | ICD-10-CM

## 2021-12-24 HISTORY — DX: Type 2 diabetes mellitus without complications: E11.9

## 2021-12-24 LAB — RETICULOCYTES
Immature Retic Fract: 21.5 % — ABNORMAL HIGH (ref 2.3–15.9)
RBC.: 2.94 MIL/uL — ABNORMAL LOW (ref 4.22–5.81)
Retic Count, Absolute: 122.9 10*3/uL (ref 19.0–186.0)
Retic Ct Pct: 4.2 % — ABNORMAL HIGH (ref 0.4–3.1)

## 2021-12-24 LAB — CBC WITH DIFFERENTIAL/PLATELET
Abs Immature Granulocytes: 0.07 10*3/uL (ref 0.00–0.07)
Basophils Absolute: 0 10*3/uL (ref 0.0–0.1)
Basophils Relative: 0 %
Eosinophils Absolute: 0 10*3/uL (ref 0.0–0.5)
Eosinophils Relative: 0 %
HCT: 28 % — ABNORMAL LOW (ref 39.0–52.0)
Hemoglobin: 9.9 g/dL — ABNORMAL LOW (ref 13.0–17.0)
Immature Granulocytes: 1 %
Lymphocytes Relative: 10 %
Lymphs Abs: 0.7 10*3/uL (ref 0.7–4.0)
MCH: 26.8 pg (ref 26.0–34.0)
MCHC: 35.4 g/dL (ref 30.0–36.0)
MCV: 75.7 fL — ABNORMAL LOW (ref 80.0–100.0)
Monocytes Absolute: 0.6 10*3/uL (ref 0.1–1.0)
Monocytes Relative: 9 %
Neutro Abs: 5.8 10*3/uL (ref 1.7–7.7)
Neutrophils Relative %: 80 %
Platelets: 35 10*3/uL — ABNORMAL LOW (ref 150–400)
RBC: 3.7 MIL/uL — ABNORMAL LOW (ref 4.22–5.81)
RDW: 17.2 % — ABNORMAL HIGH (ref 11.5–15.5)
WBC: 7.3 10*3/uL (ref 4.0–10.5)
nRBC: 0 % (ref 0.0–0.2)

## 2021-12-24 LAB — TYPE AND SCREEN
ABO/RH(D): A NEG
Antibody Screen: NEGATIVE

## 2021-12-24 LAB — CK: Total CK: 190 U/L (ref 49–397)

## 2021-12-24 LAB — BASIC METABOLIC PANEL
Anion gap: 9 (ref 5–15)
BUN: 14 mg/dL (ref 6–20)
CO2: 22 mmol/L (ref 22–32)
Calcium: 7.5 mg/dL — ABNORMAL LOW (ref 8.9–10.3)
Chloride: 100 mmol/L (ref 98–111)
Creatinine, Ser: 0.6 mg/dL — ABNORMAL LOW (ref 0.61–1.24)
GFR, Estimated: >60 mL/min (ref >60)
Glucose, Bld: 191 mg/dL — ABNORMAL HIGH (ref 70–99)
Potassium: 3.8 mmol/L (ref 3.5–5.1)
Sodium: 131 mmol/L — ABNORMAL LOW (ref 135–145)

## 2021-12-24 LAB — GLUCOSE, CAPILLARY
Glucose-Capillary: 211 mg/dL — ABNORMAL HIGH (ref 70–99)
Glucose-Capillary: 229 mg/dL — ABNORMAL HIGH (ref 70–99)

## 2021-12-24 LAB — CBC
HCT: 24.3 % — ABNORMAL LOW (ref 39.0–52.0)
HCT: 23.1 % — ABNORMAL LOW (ref 39.0–52.0)
Hemoglobin: 8.7 g/dL — ABNORMAL LOW (ref 13.0–17.0)
Hemoglobin: 8 g/dL — ABNORMAL LOW (ref 13.0–17.0)
MCH: 27.4 pg (ref 26.0–34.0)
MCH: 27.3 pg (ref 26.0–34.0)
MCHC: 35.8 g/dL (ref 30.0–36.0)
MCHC: 34.6 g/dL (ref 30.0–36.0)
MCV: 76.7 fL — ABNORMAL LOW (ref 80.0–100.0)
MCV: 78.8 fL — ABNORMAL LOW (ref 80.0–100.0)
Platelets: 21 10*3/uL — CL (ref 150–400)
Platelets: 15 10*3/uL — CL (ref 150–400)
RBC: 3.17 MIL/uL — ABNORMAL LOW (ref 4.22–5.81)
RBC: 2.93 MIL/uL — ABNORMAL LOW (ref 4.22–5.81)
RDW: 17.2 % — ABNORMAL HIGH (ref 11.5–15.5)
RDW: 17.5 % — ABNORMAL HIGH (ref 11.5–15.5)
WBC: 5.4 10*3/uL (ref 4.0–10.5)
WBC: 3.4 10*3/uL — ABNORMAL LOW (ref 4.0–10.5)
nRBC: 0 % (ref 0.0–0.2)
nRBC: 0 % (ref 0.0–0.2)

## 2021-12-24 LAB — COMPREHENSIVE METABOLIC PANEL
ALT: 303 U/L — ABNORMAL HIGH (ref 0–44)
AST: 422 U/L — ABNORMAL HIGH (ref 15–41)
Albumin: 3.6 g/dL (ref 3.5–5.0)
Alkaline Phosphatase: 45 U/L (ref 38–126)
Anion gap: 15 (ref 5–15)
BUN: 15 mg/dL (ref 6–20)
CO2: 20 mmol/L — ABNORMAL LOW (ref 22–32)
Calcium: 8.1 mg/dL — ABNORMAL LOW (ref 8.9–10.3)
Chloride: 93 mmol/L — ABNORMAL LOW (ref 98–111)
Creatinine, Ser: 0.65 mg/dL (ref 0.61–1.24)
GFR, Estimated: >60 mL/min (ref >60)
Glucose, Bld: 205 mg/dL — ABNORMAL HIGH (ref 70–99)
Potassium: 3.7 mmol/L (ref 3.5–5.1)
Sodium: 128 mmol/L — ABNORMAL LOW (ref 135–145)
Total Bilirubin: 2.1 mg/dL — ABNORMAL HIGH (ref 0.3–1.2)
Total Protein: 6.1 g/dL — ABNORMAL LOW (ref 6.5–8.1)

## 2021-12-24 LAB — IRON AND TIBC
Iron: 49 ug/dL (ref 45–182)
Saturation Ratios: 17 % — ABNORMAL LOW (ref 17.9–39.5)
TIBC: 293 ug/dL (ref 250–450)
UIBC: 244 ug/dL

## 2021-12-24 LAB — TSH: TSH: 0.208 u[IU]/mL — ABNORMAL LOW (ref 0.350–4.500)

## 2021-12-24 LAB — PROTIME-INR
INR: 1.6 — ABNORMAL HIGH (ref 0.8–1.2)
Prothrombin Time: 19.2 seconds — ABNORMAL HIGH (ref 11.4–15.2)

## 2021-12-24 LAB — HEMOGLOBIN A1C
Hgb A1c MFr Bld: 6.9 % — ABNORMAL HIGH (ref 4.8–5.6)
Mean Plasma Glucose: 151.33 mg/dL

## 2021-12-24 LAB — PHOSPHORUS: Phosphorus: 1.8 mg/dL — ABNORMAL LOW (ref 2.5–4.6)

## 2021-12-24 LAB — VITAMIN B12: Vitamin B-12: 679 pg/mL (ref 180–914)

## 2021-12-24 LAB — OCCULT BLOOD X 1 CARD TO LAB, STOOL: Fecal Occult Bld: POSITIVE — AB

## 2021-12-24 LAB — AMMONIA: Ammonia: 56 umol/L — ABNORMAL HIGH (ref 9–35)

## 2021-12-24 LAB — FERRITIN: Ferritin: 61 ng/mL (ref 24–336)

## 2021-12-24 LAB — MAGNESIUM: Magnesium: 2 mg/dL (ref 1.7–2.4)

## 2021-12-24 LAB — FOLATE: Folate: 5.4 ng/mL — ABNORMAL LOW (ref >5.9)

## 2021-12-24 LAB — LIPASE, BLOOD: Lipase: 46 U/L (ref 11–51)

## 2021-12-24 LAB — ABO/RH: ABO/RH(D): A NEG

## 2021-12-24 LAB — ETHANOL: Alcohol, Ethyl (B): 123 mg/dL — ABNORMAL HIGH (ref <10)

## 2021-12-24 MED ORDER — SODIUM CHLORIDE 0.9 % IV SOLN
2.0000 g | Freq: Once | INTRAVENOUS | Status: AC
  Administered 2021-12-24: 2 g via INTRAVENOUS
  Filled 2021-12-24: qty 20

## 2021-12-24 MED ORDER — ACETAMINOPHEN 650 MG RE SUPP: 650.0000 mg | Freq: Four times a day (QID) | RECTAL | Status: DC | PRN

## 2021-12-24 MED ORDER — SODIUM CHLORIDE 0.9 % IV SOLN: INTRAVENOUS | Status: AC

## 2021-12-24 MED ORDER — LORAZEPAM 2 MG/ML IJ SOLN
0.0000 mg | Freq: Four times a day (QID) | INTRAMUSCULAR | Status: AC
  Administered 2021-12-24 – 2021-12-25 (×2): 1 mg via INTRAVENOUS
  Filled 2021-12-24 (×2): qty 1

## 2021-12-24 MED ORDER — SODIUM CHLORIDE 0.9% IV SOLUTION: Freq: Once | INTRAVENOUS | Status: AC

## 2021-12-24 MED ORDER — ALBUTEROL SULFATE (2.5 MG/3ML) 0.083% IN NEBU: 2.5000 mg | INHALATION_SOLUTION | RESPIRATORY_TRACT | Status: DC | PRN

## 2021-12-24 MED ORDER — INSULIN ASPART 100 UNIT/ML IJ SOLN
0.0000 [IU] | INTRAMUSCULAR | Status: DC
  Administered 2021-12-24 (×2): 3 [IU] via SUBCUTANEOUS
  Administered 2021-12-25: 1 [IU] via SUBCUTANEOUS
  Administered 2021-12-25: 2 [IU] via SUBCUTANEOUS
  Administered 2021-12-25: 1 [IU] via SUBCUTANEOUS
  Administered 2021-12-25: 3 [IU] via SUBCUTANEOUS
  Administered 2021-12-26 (×2): 7 [IU] via SUBCUTANEOUS
  Administered 2021-12-27 (×3): 2 [IU] via SUBCUTANEOUS
  Administered 2021-12-28: 5 [IU] via SUBCUTANEOUS
  Administered 2021-12-28: 1 [IU] via SUBCUTANEOUS

## 2021-12-24 MED ORDER — THIAMINE HCL 100 MG/ML IJ SOLN
100.0000 mg | Freq: Every day | INTRAMUSCULAR | Status: DC
  Administered 2021-12-24: 100 mg via INTRAVENOUS
  Filled 2021-12-24: qty 2

## 2021-12-24 MED ORDER — LORAZEPAM 1 MG PO TABS
0.0000 mg | ORAL_TABLET | Freq: Two times a day (BID) | ORAL | Status: AC
  Administered 2021-12-26 – 2021-12-28 (×4): 1 mg via ORAL
  Filled 2021-12-24 (×4): qty 1

## 2021-12-24 MED ORDER — IOHEXOL 350 MG/ML SOLN
100.0000 mL | Freq: Once | INTRAVENOUS | Status: AC | PRN
  Administered 2021-12-24: 100 mL via INTRAVENOUS

## 2021-12-24 MED ORDER — PANTOPRAZOLE SODIUM 40 MG IV SOLR
40.0000 mg | Freq: Once | INTRAVENOUS | Status: DC
  Filled 2021-12-24: qty 10

## 2021-12-24 MED ORDER — HYDROCODONE-ACETAMINOPHEN 5-325 MG PO TABS
1.0000 | ORAL_TABLET | ORAL | Status: DC | PRN
  Administered 2021-12-25 – 2021-12-28 (×6): 2 via ORAL
  Filled 2021-12-24 (×6): qty 2

## 2021-12-24 MED ORDER — PANTOPRAZOLE SODIUM 40 MG IV SOLR: 40.0000 mg | Freq: Two times a day (BID) | INTRAVENOUS | Status: DC

## 2021-12-24 MED ORDER — SODIUM CHLORIDE 0.9 % IV SOLN
2.0000 g | INTRAVENOUS | Status: DC
  Administered 2021-12-26 – 2021-12-28 (×2): 2 g via INTRAVENOUS
  Filled 2021-12-24 (×2): qty 20

## 2021-12-24 MED ORDER — DIPHENHYDRAMINE HCL 25 MG PO CAPS: 25.0000 mg | ORAL_CAPSULE | Freq: Once | ORAL | Status: DC

## 2021-12-24 MED ORDER — PANTOPRAZOLE INFUSION (NEW) - SIMPLE MED
8.0000 mg/h | INTRAVENOUS | Status: DC
  Administered 2021-12-24 – 2021-12-26 (×4): 8 mg/h via INTRAVENOUS
  Filled 2021-12-24 (×2): qty 80
  Filled 2021-12-24: qty 100
  Filled 2021-12-24: qty 80

## 2021-12-24 MED ORDER — ACETAMINOPHEN 325 MG PO TABS: 650.0000 mg | ORAL_TABLET | Freq: Once | ORAL | Status: DC

## 2021-12-24 MED ORDER — PANTOPRAZOLE 80MG IVPB - SIMPLE MED
80.0000 mg | Freq: Once | INTRAVENOUS | Status: AC
  Administered 2021-12-24: 80 mg via INTRAVENOUS
  Filled 2021-12-24: qty 100

## 2021-12-24 MED ORDER — THIAMINE HCL 100 MG/ML IJ SOLN
100.0000 mg | Freq: Every day | INTRAMUSCULAR | Status: DC
  Administered 2021-12-25 – 2021-12-27 (×3): 100 mg via INTRAVENOUS
  Filled 2021-12-24 (×3): qty 2

## 2021-12-24 MED ORDER — ACETAMINOPHEN 325 MG PO TABS
650.0000 mg | ORAL_TABLET | Freq: Four times a day (QID) | ORAL | Status: DC | PRN
  Administered 2021-12-24 – 2021-12-26 (×2): 650 mg via ORAL
  Filled 2021-12-24 (×2): qty 2

## 2021-12-24 MED ORDER — THIAMINE MONONITRATE 100 MG PO TABS: 100.0000 mg | ORAL_TABLET | Freq: Every day | ORAL | Status: DC

## 2021-12-24 MED ORDER — ONDANSETRON HCL 4 MG/2ML IJ SOLN
4.0000 mg | Freq: Once | INTRAMUSCULAR | Status: AC
  Administered 2021-12-24: 4 mg via INTRAVENOUS
  Filled 2021-12-24: qty 2

## 2021-12-24 MED ORDER — SODIUM CHLORIDE 0.9 % IV SOLN
50.0000 ug/h | INTRAVENOUS | Status: DC
  Administered 2021-12-24 – 2021-12-26 (×4): 50 ug/h via INTRAVENOUS
  Filled 2021-12-24 (×5): qty 1

## 2021-12-24 MED ORDER — SODIUM CHLORIDE 0.9 % IV BOLUS
1000.0000 mL | Freq: Once | INTRAVENOUS | Status: AC
  Administered 2021-12-24: 1000 mL via INTRAVENOUS

## 2021-12-24 MED ORDER — LORAZEPAM 1 MG PO TABS: 0.0000 mg | ORAL_TABLET | Freq: Four times a day (QID) | ORAL | Status: AC

## 2021-12-24 MED ORDER — OCTREOTIDE LOAD VIA INFUSION
100.0000 ug | Freq: Once | INTRAVENOUS | Status: AC
  Administered 2021-12-24: 100 ug via INTRAVENOUS
  Filled 2021-12-24: qty 50

## 2021-12-24 MED ORDER — LORAZEPAM 2 MG/ML IJ SOLN: 0.0000 mg | Freq: Two times a day (BID) | INTRAMUSCULAR | Status: AC

## 2021-12-24 MED ORDER — MORPHINE SULFATE (PF) 2 MG/ML IV SOLN
2.0000 mg | INTRAVENOUS | Status: DC | PRN
  Administered 2021-12-24: 2 mg via INTRAVENOUS
  Filled 2021-12-24: qty 1

## 2021-12-24 MED ORDER — PANTOPRAZOLE SODIUM 40 MG IV SOLR
INTRAVENOUS | Status: AC
  Filled 2021-12-24: qty 40

## 2021-12-24 MED ORDER — LORAZEPAM 2 MG/ML IJ SOLN
1.0000 mg | Freq: Once | INTRAMUSCULAR | Status: AC
  Administered 2021-12-24: 1 mg via INTRAVENOUS
  Filled 2021-12-24: qty 1

## 2021-12-24 NOTE — Assessment & Plan Note (Signed)
-   Gl[place] Blatchford score   Hg <78M    Melena  >1 Justifies admission and aggressive management      Modifying risk factors include:  alcohol abuse  liver disease     Admit to progressive given above    - ER  Provider spoke to gastroenterology Sadie Haber ) they will see patient in a.m. appreciate their consult   - serial CBC.    - Monitor for any recurrence,  evidence of hemodynamic instability or significant blood loss  - Transfuse as needed for hemoglobin below 7 or evidence of life-threatening bleeding  - Establish at least 2 PIV and fluid resuscitate   - clear liquids for tonight keep nothing by mouth post midnight,   -  administer Protonix  drip   -  Administer octreotide given possibility of variceal bleeding,    -    Given suspected Variceal bleed initiate antibiotics   IV Cefrtiaxone

## 2021-12-24 NOTE — Assessment & Plan Note (Signed)
We will repeat CBC and if confirmed will need to transfuse aiming for at least platelet count above 50 while having acute GI bleed

## 2021-12-24 NOTE — Assessment & Plan Note (Signed)
  MELD-Na: 22 at 12/24/2021 11:43 AM In the setting of ongoing alcohol abuse poor prognosis Noted thrombocytopenia elevated INR acute GI bleed worrisome signs Appreciate [facility] consult

## 2021-12-24 NOTE — ED Notes (Signed)
Dr Billy Fischer notified of critical lab value, Platelets 21K and Hgb dropped from 9.9 to 8.7. Since patient is still bleeding and labs have worsened Dr Billy Fischer requesting there be a revisit of ED to ED transfer to [facility]. Called and spoke with [employer] RN who agreed that patient can be sent.

## 2021-12-24 NOTE — ED Triage Notes (Signed)
Reported was vomiting last night, black in nature; reported alcohol abuse, had 2 beers this morning, usually drinks 12 a day.

## 2021-12-24 NOTE — ED Notes (Signed)
Pt to be transported to [facility] ED . Accepting Dr Armandina Gemma

## 2021-12-24 NOTE — ED Notes (Signed)
[facility] left facility with patient.

## 2021-12-24 NOTE — ED Notes (Signed)
ED TO INPATIENT HANDOFF REPORT  ED Nurse Name and Phone #: Angelina Pih RN  S Name/Age/Gender [name] 51 y.o. male Room/Bed: MH01/MH01  Code Status   Code Status: Prior  Home/SNF/Other Home Patient oriented to: self, place, time, and situation Is this baseline? Yes   Triage Complete: Triage complete  Chief Complaint GIB (gastrointestinal bleeding) [K92.2]  Triage Note Reported was vomiting last night, black in nature; reported alcohol abuse, had 2 beers this morning, usually drinks 12 a day.    Allergies No Known Allergies  Level of Care/Admitting Diagnosis ED Disposition     ED Disposition  Admit   Condition  --   Comment  Hospital Area: [facility] [[id number]]  Level of Care: Progressive [102]  Admit to Progressive based on following criteria: GI, ENDOCRINE disease patients with GI bleeding, acute liver failure or pancreatitis, stable with diabetic ketoacidosis or thyrotoxicosis (hypothyroid) state.  May [facility] to [facility] if equivalent level of care is available:: Yes  Interfacility transfer: Yes  Covid Evaluation: Asymptomatic - no recent exposure (last 10 days) testing not required  Diagnosis: GIB (gastrointestinal bleeding) [952841]  Admitting Physician: Jackelyn Knife [[id number]]  Attending Physician: Lennice Sites [[id number]]  Certification:: I certify this patient will need inpatient services for at least 2 midnights  Estimated Length of Stay: 2          B Medical/Surgery History Past Medical History:  Diagnosis Date   Alcoholic ([place])    Anxiety    DVT (deep venous thrombosis) ([place])    Hypertension    Past Surgical History:  Procedure Laterality Date   BLOOD PATCH     TONSILLECTOMY       A IV Location/Drains/Wounds Patient Lines/Drains/Airways Status     Active Line/Drains/Airways     Name Placement date Placement time Site Days   Peripheral IV 12/24/21 20 G Left Antecubital  12/24/21  1155  Antecubital  less than 1   Peripheral IV 12/24/21 20 G Posterior;Right Hand 12/24/21  1259  Hand  less than 1            Intake/Output Last 24 hours  Intake/Output Summary (Last 24 hours) at 12/24/2021 1545 Last data filed at 12/24/2021 1511 Gross per 24 hour  Intake 2002.24 ml  Output --  Net 2002.24 ml    Labs/Imaging Results for orders placed or performed during the hospital encounter of 12/24/21 (from the past 48 hour(s))  Occult blood card to lab, stool     Status: Abnormal   Collection Time: 12/24/21 10:00 AM  Result Value Ref Range   Fecal Occult Bld POSITIVE (A) NEGATIVE    Comment: Performed at [facility], [address] [place], [place] [address]  CBC with Differential     Status: Abnormal   Collection Time: 12/24/21 11:43 AM  Result Value Ref Range   WBC 7.3 4.0 - 10.5 K/uL   RBC 3.70 (L) 4.22 - 5.81 MIL/uL   Hemoglobin 9.9 (L) 13.0 - 17.0 g/dL   HCT 28.0 (L) 39.0 - 52.0 %   MCV 75.7 (L) 80.0 - 100.0 fL   MCH 26.8 26.0 - 34.0 pg   MCHC 35.4 30.0 - 36.0 g/dL   RDW 17.2 (H) 11.5 - 15.5 %   Platelets 35 (L) 150 - 400 K/uL    Comment: SPECIMEN CHECKED FOR CLOTS REPEATED TO VERIFY    nRBC 0.0 0.0 - 0.2 %   Neutrophils Relative % 80 %  Neutro Abs 5.8 1.7 - 7.7 K/uL   Lymphocytes Relative 10 %   Lymphs Abs 0.7 0.7 - 4.0 K/uL   Monocytes Relative 9 %   Monocytes Absolute 0.6 0.1 - 1.0 K/uL   Eosinophils Relative 0 %   Eosinophils Absolute 0.0 0.0 - 0.5 K/uL   Basophils Relative 0 %   Basophils Absolute 0.0 0.0 - 0.1 K/uL   Immature Granulocytes 1 %   Abs Immature Granulocytes 0.07 0.00 - 0.07 K/uL    Comment: Performed at [facility], [address] [place], [place] [address]  Comprehensive metabolic panel     Status: Abnormal   Collection Time: 12/24/21 11:43 AM  Result Value Ref Range   Sodium 128 (L) 135 - 145 mmol/L   Potassium 3.7 3.5 - 5.1 mmol/L   Chloride 93 (L) 98 - 111 mmol/L   CO2 20 (L) 22 - 32  mmol/L   Glucose, Bld 205 (H) 70 - 99 mg/dL    Comment: Glucose reference range applies only to samples taken after fasting for at least 8 hours.   BUN 15 6 - 20 mg/dL   Creatinine, Ser 0.65 0.61 - 1.24 mg/dL   Calcium 8.1 (L) 8.9 - 10.3 mg/dL   Total Protein 6.1 (L) 6.5 - 8.1 g/dL   Albumin 3.6 3.5 - 5.0 g/dL   AST 422 (H) 15 - 41 U/L   ALT 303 (H) 0 - 44 U/L   Alkaline Phosphatase 45 38 - 126 U/L   Total Bilirubin 2.1 (H) 0.3 - 1.2 mg/dL   GFR, Estimated >60 >60 mL/min    Comment: (NOTE) Calculated using the CKD-EPI Creatinine Equation (2021)    Anion gap 15 5 - 15    Comment: Performed at [facility], [address] [place], [place] [address]  Lipase, blood     Status: None   Collection Time: 12/24/21 11:43 AM  Result Value Ref Range   Lipase 46 11 - 51 U/L    Comment: Performed at [facility], [address] [place], [place] [address]  Protime-INR     Status: Abnormal   Collection Time: 12/24/21 11:43 AM  Result Value Ref Range   Prothrombin Time 19.2 (H) 11.4 - 15.2 seconds   INR 1.6 (H) 0.8 - 1.2    Comment: (NOTE) INR goal varies based on device and disease states. Performed at [facility], [address] [place], [place] [address]   Ethanol     Status: Abnormal   Collection Time: 12/24/21 11:43 AM  Result Value Ref Range   Alcohol, Ethyl (B) 123 (H) <10 mg/dL    Comment: (NOTE) Lowest detectable limit for serum alcohol is 10 mg/dL.  For medical purposes only. Performed at [facility], [address] [place], [place] [address]   CBC     Status: Abnormal   Collection Time: 12/24/21  2:41 PM  Result Value Ref Range   WBC 5.4 4.0 - 10.5 K/uL   RBC 3.17 (L) 4.22 - 5.81 MIL/uL   Hemoglobin 8.7 (L) 13.0 - 17.0 g/dL    Comment: Reticulocyte Hemoglobin testing may be clinically indicated, consider ordering this additional test [id number]    HCT 24.3 (L) 39.0 - 52.0 %   MCV 76.7 (L) 80.0 - 100.0 fL   MCH 27.4  26.0 - 34.0 pg   MCHC 35.8 30.0 - 36.0 g/dL   RDW 17.2 (H) 11.5 - 15.5 %  Platelets 21 (LL) 150 - 400 K/uL    Comment: Immature Platelet Fraction may be clinically indicated, consider ordering this additional test [id number] THIS CRITICAL RESULT HAS VERIFIED AND BEEN CALLED TO T. PHILLIPS RN BY ISAAC Beverly ON 10 03 2023 AT 1535, AND HAS BEEN READ BACK. RBV    nRBC 0.0 0.0 - 0.2 %    Comment: Performed at [facility], [address] [place], [place] [address]   CT ANGIO GI BLEED  Result Date: 12/24/2021 CLINICAL DATA:  Left upper quadrant pain. Possible pancreatitis. Evaluate for GI bleed. Vomiting last night black in nature. Alcohol abuse. EXAM: CTA ABDOMEN AND PELVIS WITHOUT AND WITH CONTRAST TECHNIQUE: Multidetector CT imaging of the abdomen and pelvis was performed using the standard protocol during bolus administration of intravenous contrast. Multiplanar reconstructed images and MIPs were obtained and reviewed to evaluate the vascular anatomy. RADIATION DOSE REDUCTION: This exam was performed according to the departmental dose-optimization program which includes automated exposure control, adjustment of the mA and/or kV according to patient size and/or use of iterative reconstruction technique. CONTRAST:  160m OMNIPAQUE IOHEXOL 350 MG/ML SOLN COMPARISON:  08/06/2012 FINDINGS: VASCULAR Aorta: Normal in caliber without evidence of aneurysm or dissection. Minimal calcified plaque is present. Celiac: Patent without evidence of aneurysm, dissection, vasculitis or significant stenosis. SMA: Patent without evidence of aneurysm, dissection, vasculitis or significant stenosis. Renals: Both renal arteries are patent without evidence of aneurysm, dissection, vasculitis, fibromuscular dysplasia or significant stenosis. There is an accessory right renal artery which is patent. Mild calcified plaque over the proximal more inferior right renal artery. IMA: Patent without evidence of aneurysm,  dissection, vasculitis or significant stenosis. Inflow: Patent without evidence of aneurysm, dissection, vasculitis or significant stenosis. Proximal Outflow: Bilateral common femoral and visualized portions of the superficial and profunda femoral arteries are patent without evidence of aneurysm, dissection, vasculitis or significant stenosis. Veins: No obvious venous abnormality within the limitations of this arterial phase study. Review of the MIP images confirms the above findings. NON-VASCULAR Lower chest: Lung bases are clear. Moderate size hiatal hernia is present. Hepatobiliary: There is a 4.6 cm well-defined oval mass over the right lobe of the liver with hypodense center. Small hypodense liver with nodular contour compatible with cirrhosis. Gallbladder and biliary tree are normal. Pancreas: Normal. Spleen: Mild splenomegaly. Adrenals/Urinary Tract: Adrenal glands are normal. Kidneys are normal in size without hydronephrosis or nephrolithiasis. Bladder and ureters are normal. Stomach/Bowel: Moderate size hiatal hernia. Small bowel is otherwise unremarkable. Appendix is normal. Colon is unremarkable. Small amount of retained oral contrast over the terminal ileum and cecum. Lymphatic: No significant adenopathy. Reproductive: Normal. Other: No free fluid or focal inflammatory change. Musculoskeletal: No focal abnormality. IMPRESSION: VASCULAR: 1.  No evidence of acute GI bleed. 2.  Mild aortic atherosclerosis. NONVASCULAR: 1.  No acute findings in the abdomen/pelvis. 2. Evidence of patient's known cirrhosis with associated splenomegaly. There is a 4.6 cm mass over the right lobe of the liver suspicious for hepatocellular carcinoma. Recommend MRI for further evaluation. 3.  Moderate size hiatal hernia. Electronically Signed   By: DMarin OlpM.D.   On: 12/24/2021 12:58   DG Chest Portable 1 View  Result Date: 12/24/2021 CLINICAL DATA:  Chest pain EXAM: PORTABLE CHEST 1 VIEW COMPARISON:  Chest radiograph  08/10/2012 FINDINGS: The heart size and mediastinal contours are within normal limits. Both lungs are clear. The visualized skeletal structures are unremarkable. IMPRESSION: No radiographic finding to explain chest pain Electronically Signed   By:  Marin Roberts M.D.   On: 12/24/2021 12:04    Pending Labs Unresulted Labs (From admission, onward)     Start     Ordered   12/24/21 1445  Urinalysis, Routine w reflex microscopic Urine, Clean Catch  Once,   URGENT        12/24/21 1444            Vitals/Pain Today's Vitals   12/24/21 1248 12/24/21 1300 12/24/21 1443 12/24/21 1444  BP: 124/72 119/79    Pulse: (!) 52 (!) 103    Resp: 16 (!) 21    Temp:   98.3 F (36.8 C)   TempSrc:   Oral   SpO2: 97% 95%    Weight:      Height:      PainSc:    3     Isolation Precautions No active isolations  Medications Medications  pantoprozole (PROTONIX) 80 mg /NS 100 mL infusion (8 mg/hr Intravenous New Bag/Given 12/24/21 1203)  pantoprazole (PROTONIX) injection 40 mg (has no administration in time range)  LORazepam (ATIVAN) injection 0-4 mg (has no administration in time range)    Or  LORazepam (ATIVAN) tablet 0-4 mg (has no administration in time range)  LORazepam (ATIVAN) injection 0-4 mg (has no administration in time range)    Or  LORazepam (ATIVAN) tablet 0-4 mg (has no administration in time range)  thiamine (VITAMIN B1) tablet 100 mg ( Oral See Alternative 12/24/21 1209)    Or  thiamine (VITAMIN B1) injection 100 mg (100 mg Intravenous Given 12/24/21 1209)  pantoprazole (PROTONIX) 40 MG injection (has no administration in time range)  octreotide (SANDOSTATIN) 2 mcg/mL load via infusion 100 mcg (100 mcg Intravenous Bolus from Bag 12/24/21 1423)    And  octreotide (SANDOSTATIN) 500 mcg in sodium chloride 0.9 % 250 mL (2 mcg/mL) infusion (50 mcg/hr Intravenous New Bag/Given 12/24/21 1440)  ondansetron (ZOFRAN) injection 4 mg (4 mg Intravenous Given 12/24/21 1157)  sodium chloride 0.9 %  bolus 1,000 mL (0 mLs Intravenous Stopped 12/24/21 1421)  pantoprazole (PROTONIX) 80 mg /NS 100 mL IVPB (0 mg Intravenous Stopped 12/24/21 1302)  LORazepam (ATIVAN) injection 1 mg (1 mg Intravenous Given 12/24/21 1210)  iohexol (OMNIPAQUE) 350 MG/ML injection 100 mL (100 mLs Intravenous Contrast Given 12/24/21 1218)  cefTRIAXone (ROCEPHIN) 2 g in sodium chloride 0.9 % 100 mL IVPB (0 g Intravenous Stopped 12/24/21 1511)    Mobility walks Moderate fall risk   Focused Assessments    R Recommendations: See Admitting Provider Note  Report given to:   Additional Notes:

## 2021-12-24 NOTE — Assessment & Plan Note (Signed)
Spoke about importance of quitting order CIWA protocol

## 2021-12-24 NOTE — Assessment & Plan Note (Signed)
-   Order Sensitive  SSI     - Hold by mouth medications*

## 2021-12-24 NOTE — H&P (Signed)
[name] ZHG:[id number] DOB: Dec 26, 1970 DOA: 12/24/2021   PCP: [name], MD   Outpatient Specialists: NONE    Patient arrived to ER on 12/24/21 at 1116 Referred by Attending Lennice Sites, DO   Patient coming from:    home Lives  With family    Chief Complaint:   Chief Complaint  Patient presents with   Hematemesis   Alcohol Problem    HPI: [name] is a 50 y.o. male with medical history significant of [name], DM 2    Presented with  hematemesis Presents with ground coffee emesis yesterday continues to drink had at least 2 beers this morning usually drinks about 12 beers per day Started to have blood in the vomit and dark stools tarry stools for the past few days.  Also reports epigastric pain no large-volume hematemesis at this time no prior history of GI bleed never had a EGD or colonoscopy.  Denies any chest pain shortness of breath Last drink was yesterday morning  No hx of severe detox With shakes no seizure Has been drinking since highschool stayed sober for 9 years   Regarding pertinent Chronic problems:        DM 2 -  Lab Results  Component Value Date   HGBA1C 7.4 (H) 12/09/2020   PO meds only,        Chronic anemia - baseline hg Hemoglobin & Hematocrit  Recent Labs    12/23/21 1200 12/24/21 1143 12/24/21 1441  HGB 13.4 9.9* 8.7*     While in ER:   Found to have plt 20's Hg 9.9 Asodium 128 INR 1.6 Positive Hemoccult stool ER provider spoke with [facility] plan to start patient on CIWA Rocephin Protonix and octreotide drip  CXR -  NON acute  CTA abd/pelvis -  No evidence of acute GI bleed. Evidence of patient's known cirrhosis with associated splenomegaly. There is a 4.6 cm mass over the right lobe of the liver suspicious for hepatocellular carcinoma. Recommend MRI for further evaluation. Moderate size hiatal hernia.    Following Medications were ordered in ER: Medications  pantoprozole (PROTONIX) 80 mg  /NS 100 mL infusion (8 mg/hr Intravenous New Bag/Given 12/24/21 1203)  pantoprazole (PROTONIX) injection 40 mg (has no administration in time range)  LORazepam (ATIVAN) injection 0-4 mg (1 mg Intravenous Given 12/24/21 1903)    Or  LORazepam (ATIVAN) tablet 0-4 mg ( Oral See Alternative 12/24/21 1903)  LORazepam (ATIVAN) injection 0-4 mg (has no administration in time range)    Or  LORazepam (ATIVAN) tablet 0-4 mg (has no administration in time range)  thiamine (VITAMIN B1) tablet 100 mg ( Oral See Alternative 12/24/21 1209)    Or  thiamine (VITAMIN B1) injection 100 mg (100 mg Intravenous Given 12/24/21 1209)  pantoprazole (PROTONIX) 40 MG injection (has no administration in time range)  octreotide (SANDOSTATIN) 2 mcg/mL load via infusion 100 mcg (100 mcg Intravenous Bolus from Bag 12/24/21 1423)    And  octreotide (SANDOSTATIN) 500 mcg in sodium chloride 0.9 % 250 mL (2 mcg/mL) infusion (50 mcg/hr Intravenous New Bag/Given 12/24/21 1440)  ondansetron (ZOFRAN) injection 4 mg (4 mg Intravenous Given 12/24/21 1157)  sodium chloride 0.9 % bolus 1,000 mL (0 mLs Intravenous Stopped 12/24/21 1421)  pantoprazole (PROTONIX) 80 mg /NS 100 mL IVPB (0 mg Intravenous Stopped 12/24/21 1302)  LORazepam (ATIVAN) injection 1 mg (1 mg Intravenous Given 12/24/21 1210)  iohexol (OMNIPAQUE) 350 MG/ML injection 100 mL (100 mLs Intravenous Contrast Given 12/24/21  1218)  cefTRIAXone (ROCEPHIN) 2 g in sodium chloride 0.9 % 100 mL IVPB (0 g Intravenous Stopped 12/24/21 1511)    _______________________________________________________ ER Provider Called:  [facility]    Dr. Deno Etienne They Recommend admit to medicine   Will see in AM     ED Triage Vitals  Enc Vitals Group     BP 12/24/21 1126 (!) 155/93     Pulse Rate 12/24/21 1126 65     Resp 12/24/21 1126 20     Temp 12/24/21 1126 97.8 F (36.6 C)     Temp Source 12/24/21 1126 Oral     SpO2 12/24/21 1126 99 %     Weight 12/24/21 1127 155 lb (70.3 kg)     Height 12/24/21  1127 '5\' 7"'$  (1.702 m)     Head Circumference --      Peak Flow --      Pain Score 12/24/21 1127 10     Pain Loc --      Pain Edu? --      Excl. in [place]? --   TMAX(24)@     _________________________________________ Significant initial  Findings: Abnormal Labs Reviewed  CBC WITH DIFFERENTIAL/PLATELET - Abnormal; Notable for the following components:      Result Value   RBC 3.70 (*)    Hemoglobin 9.9 (*)    HCT 28.0 (*)    MCV 75.7 (*)    RDW 17.2 (*)    Platelets 35 (*)    All other components within normal limits  COMPREHENSIVE METABOLIC PANEL - Abnormal; Notable for the following components:   Sodium 128 (*)    Chloride 93 (*)    CO2 20 (*)    Glucose, Bld 205 (*)    Calcium 8.1 (*)    Total Protein 6.1 (*)    AST 422 (*)    ALT 303 (*)    Total Bilirubin 2.1 (*)    All other components within normal limits  PROTIME-INR - Abnormal; Notable for the following components:   Prothrombin Time 19.2 (*)    INR 1.6 (*)    All other components within normal limits  ETHANOL - Abnormal; Notable for the following components:   Alcohol, Ethyl (B) 123 (*)    All other components within normal limits  OCCULT BLOOD X 1 CARD TO LAB, STOOL - Abnormal; Notable for the following components:   Fecal Occult Bld POSITIVE (*)    All other components within normal limits  CBC - Abnormal; Notable for the following components:   RBC 3.17 (*)    Hemoglobin 8.7 (*)    HCT 24.3 (*)    MCV 76.7 (*)    RDW 17.2 (*)    Platelets 21 (*)    All other components within normal limits       ECG: Ordered Personally reviewed and interpreted by me showing: HR : 118 Rhythm:   Sinus tachycardia Paired ventricular premature complexes QTC 446   The recent clinical data is shown below. Vitals:   12/24/21 1600 12/24/21 1648 12/24/21 1700 12/24/21 1800  BP: 129/79 (!) 140/85 117/69 121/74  Pulse:  (!) 115 (!) 103 (!) 111  Resp: 13 14 (!) 23 20  Temp:    98.8 F (37.1 C)  TempSrc:    Oral  SpO2:  95%  93% 98%  Weight:    71.1 kg  Height:    '5\' 7"'$  (1.702 m)    WBC     Component Value Date/Time  WBC 5.4 12/24/2021 1441   LYMPHSABS 0.7 12/24/2021 1143   MONOABS 0.6 12/24/2021 1143   EOSABS 0.0 12/24/2021 1143   BASOSABS 0.0 12/24/2021 1143        _______________________________________________ Hospitalist was called for admission for   Gastrointestinal hemorrhage, unspecified gastrointestinal hemorrhage type    The following Work up has been ordered so far:  Orders Placed This Encounter  Procedures   Procedural/ Surgical Case Request: ESOPHAGOGASTRODUODENOSCOPY (EGD)   Critical Care   DG Chest Portable 1 View   CT ANGIO GI BLEED   CBC with Differential   Comprehensive metabolic panel   Lipase, blood   Protime-INR   Ethanol   Occult blood card to lab, stool   CBC   Urinalysis, Routine w reflex microscopic Urine, Clean Catch   Diet NPO time specified   Clinical institute withdrawal assessment every 6 hours X 48 hours, then every 12 hours x 48 hours   Notify MD if CIWA-AR is greater than 10 for 2 consecutive assessments.   Notify MD if CIWA-AR score > 10 point increase (consider critical care consult).   Notify MD if patient has a seizure.   May administer Ativan PO versus IV if patient is tolerating PO intake well   Vital signs every 6 hours X 48 hours, then per unit protocol   Refer to [place] protocol   If Ativan given, reassess Clinical Institute Withdrawal Assessment (CIWA) q 1 hour   Cardiac monitoring   Consult to gastroenterology   Consult to hospitalist   POC occult blood, ED   EKG 12-Lead   Admit to Inpatient (patient's expected length of stay will be greater than 2 midnights or inpatient only procedure)     OTHER Significant initial  Findings:  labs showing:    Recent Labs  Lab 12/23/21 1200 12/24/21 1143  NA 134* 128*  K 3.6 3.7  CO2 18* 20*  GLUCOSE 163* 205*  BUN 12 15  CREATININE 0.54* 0.65  CALCIUM 8.8* 8.1*    Cr   stable,    Lab Results  Component Value Date   CREATININE 0.65 12/24/2021   CREATININE 0.54 (L) 12/23/2021   CREATININE 0.58 (L) 09/23/2021    Recent Labs  Lab 12/23/21 1200 12/24/21 1143  AST 192* 422*  ALT 161* 303*  ALKPHOS 60 45  BILITOT 2.4* 2.1*  PROT 7.2 6.1*  ALBUMIN 4.2 3.6   Lab Results  Component Value Date   CALCIUM 8.1 (L) 12/24/2021   PHOS 3.8 08/15/2012    Plt: Lab Results  Component Value Date   PLT 21 (LL) 12/24/2021    COVID-19 Labs  Recent Labs    12/24/21 2031  FERRITIN 61    Lab Results  Component Value Date   SARSCOV2NAA NEGATIVE 12/09/2020     Arterial ***Venous  Blood Gas result:  pH *** pCO2 ***; pO2 ***;     %O2 Sat ***.  ABG No results found for: "PHART", "PCO2ART", "PO2ART", "HCO3", "TCO2", "ACIDBASEDEF", "O2SAT"       Recent Labs  Lab 12/23/21 1200 12/24/21 1143 12/24/21 1441  WBC 6.7 7.3 5.4  NEUTROABS 5.7 5.8  --   HGB 13.4 9.9* 8.7*  HCT 39.0 28.0* 24.3*  MCV 77.2* 75.7* 76.7*  PLT 39* 35* 21*    HG/HCT    Down  from baseline see below    Component Value Date/Time   HGB 8.7 (L) 12/24/2021 1441   HCT 24.3 (L) 12/24/2021 1441   MCV 76.7 (L)  12/24/2021 1441     Recent Labs  Lab 12/23/21 1200 12/24/21 1143  LIPASE 33 46   No results for input(s): "AMMONIA" in the last 168 hours.    Cardiac Panel (last 3 results) No results for input(s): "CKTOTAL", "CKMB", "TROPONINI", "RELINDX" in the last 72 hours.  .car BNP (last 3 results) No results for input(s): "BNP" in the last 8760 hours.    DM  labs:  HbA1C: No results for input(s): "HGBA1C" in the last 8760 hours.     CBG (last 3)  No results for input(s): "GLUCAP" in the last 72 hours.        Cultures:    Component Value Date/Time   SDES BLOOD RIGHT ARM 08/10/2012 1200   SPECREQUEST BOTTLES DRAWN AEROBIC ONLY 5CC 08/10/2012 1200   CULT NO GROWTH 5 DAYS 08/10/2012 1200   REPTSTATUS 08/16/2012 FINAL 08/10/2012 1200     Radiological Exams on  Admission: CT ANGIO GI BLEED  Result Date: 12/24/2021 CLINICAL DATA:  Left upper quadrant pain. Possible pancreatitis. Evaluate for GI bleed. Vomiting last night black in nature. Alcohol abuse. EXAM: CTA ABDOMEN AND PELVIS WITHOUT AND WITH CONTRAST TECHNIQUE: Multidetector CT imaging of the abdomen and pelvis was performed using the standard protocol during bolus administration of intravenous contrast. Multiplanar reconstructed images and MIPs were obtained and reviewed to evaluate the vascular anatomy. RADIATION DOSE REDUCTION: This exam was performed according to the departmental dose-optimization program which includes automated exposure control, adjustment of the mA and/or kV according to patient size and/or use of iterative reconstruction technique. CONTRAST:  175m OMNIPAQUE IOHEXOL 350 MG/ML SOLN COMPARISON:  08/06/2012 FINDINGS: VASCULAR Aorta: Normal in caliber without evidence of aneurysm or dissection. Minimal calcified plaque is present. Celiac: Patent without evidence of aneurysm, dissection, vasculitis or significant stenosis. SMA: Patent without evidence of aneurysm, dissection, vasculitis or significant stenosis. Renals: Both renal arteries are patent without evidence of aneurysm, dissection, vasculitis, fibromuscular dysplasia or significant stenosis. There is an accessory right renal artery which is patent. Mild calcified plaque over the proximal more inferior right renal artery. IMA: Patent without evidence of aneurysm, dissection, vasculitis or significant stenosis. Inflow: Patent without evidence of aneurysm, dissection, vasculitis or significant stenosis. Proximal Outflow: Bilateral common femoral and visualized portions of the superficial and profunda femoral arteries are patent without evidence of aneurysm, dissection, vasculitis or significant stenosis. Veins: No obvious venous abnormality within the limitations of this arterial phase study. Review of the MIP images confirms the above  findings. NON-VASCULAR Lower chest: Lung bases are clear. Moderate size hiatal hernia is present. Hepatobiliary: There is a 4.6 cm well-defined oval mass over the right lobe of the liver with hypodense center. Small hypodense liver with nodular contour compatible with cirrhosis. Gallbladder and biliary tree are normal. Pancreas: Normal. Spleen: Mild splenomegaly. Adrenals/Urinary Tract: Adrenal glands are normal. Kidneys are normal in size without hydronephrosis or nephrolithiasis. Bladder and ureters are normal. Stomach/Bowel: Moderate size hiatal hernia. Small bowel is otherwise unremarkable. Appendix is normal. Colon is unremarkable. Small amount of retained oral contrast over the terminal ileum and cecum. Lymphatic: No significant adenopathy. Reproductive: Normal. Other: No free fluid or focal inflammatory change. Musculoskeletal: No focal abnormality. IMPRESSION: VASCULAR: 1.  No evidence of acute GI bleed. 2.  Mild aortic atherosclerosis. NONVASCULAR: 1.  No acute findings in the abdomen/pelvis. 2. Evidence of patient's known cirrhosis with associated splenomegaly. There is a 4.6 cm mass over the right lobe of the liver suspicious for hepatocellular carcinoma. Recommend MRI for further evaluation. 3.  Moderate size hiatal hernia. Electronically Signed   By: Marin Olp M.D.   On: 12/24/2021 12:58   DG Chest Portable 1 View  Result Date: 12/24/2021 CLINICAL DATA:  Chest pain EXAM: PORTABLE CHEST 1 VIEW COMPARISON:  Chest radiograph 08/10/2012 FINDINGS: The heart size and mediastinal contours are within normal limits. Both lungs are clear. The visualized skeletal structures are unremarkable. IMPRESSION: No radiographic finding to explain chest pain Electronically Signed   By: Marin Roberts M.D.   On: 12/24/2021 12:04   _______________________________________________________________________________________________________ Latest  Blood pressure 121/74, pulse (!) 111, temperature 98.8 F (37.1 C),  temperature source Oral, resp. rate 20, height '5\' 7"'$  (1.702 m), weight 71.1 kg, SpO2 98 %.   Vitals  labs and radiology finding personally reviewed  Review of Systems:    Pertinent positives include:  nausea, vomiting, melena, blood in stool, hematemesis Constitutional:  No weight loss, night sweats, Fevers, chills, fatigue, weight loss  HEENT:  No headaches, Difficulty swallowing,Tooth/dental problems,Sore throat,  No sneezing, itching, ear ache, nasal congestion, post nasal drip,  Cardio-vascular:  No chest pain, Orthopnea, PND, anasarca, dizziness, palpitations.no Bilateral lower extremity swelling  GI:  No heartburn, indigestion, abdominal pain,  diarrhea, change in bowel habits, loss of appetite,  Resp:  no shortness of breath at rest. No dyspnea on exertion, No excess mucus, no productive cough, No non-productive cough, No coughing up of blood.No change in color of mucus.No wheezing. Skin:  no rash or lesions. No jaundice GU:  no dysuria, change in color of urine, no urgency or frequency. No straining to urinate.  No flank pain.  Musculoskeletal:  No joint pain or no joint swelling. No decreased range of motion. No back pain.  Psych:  No change in mood or affect. No depression or anxiety. No memory loss.  Neuro: no localizing neurological complaints, no tingling, no weakness, no double vision, no gait abnormality, no slurred speech, no confusion  All systems reviewed and apart from [place] all are negative _______________________________________________________________________________________________ Past Medical History:   Past Medical History:  Diagnosis Date   Alcoholic ([place])    Anxiety    DVT (deep venous thrombosis) ([place])    Hypertension      Past Surgical History:  Procedure Laterality Date   BLOOD PATCH     TONSILLECTOMY      Social History:  Ambulatory   independently     reports that he has never smoked. He has never used smokeless tobacco. He reports  current alcohol use of about 12.0 standard drinks of alcohol per week. He reports that he does not use drugs.   Family History:   Family History  Problem Relation Age of Onset   Hypertension Father    ______________________________________________________________________________________________ Allergies: No Known Allergies   Prior to Admission medications   Medication Sig Start Date End Date Taking? Authorizing Provider  ALPRAZolam Duanne Moron) 0.5 MG tablet Take 0.5 mg by mouth daily as needed. 10/15/21   [provider]  chlordiazePOXIDE (LIBRIUM) 25 MG capsule '50mg'$  PO TID x 1D, then 25-'50mg'$  PO BID X 1D, then 25-'50mg'$  PO QD X 1D 09/23/21   Lucrezia Starch, MD  folic acid (FOLVITE) 1 MG tablet Take 1 tablet (1 mg total) by mouth daily. 09/23/21   Lucrezia Starch, MD  metFORMIN (GLUCOPHAGE) 500 MG tablet Take 1 tablet (500 mg total) by mouth 2 (two) times daily with a meal. 09/23/21   Lucrezia Starch, MD  Multiple Vitamin (MULTIVITAMIN WITH MINERALS) TABS tablet Take 1 tablet  by mouth daily. 12/13/20   Ival Bible, MD  traZODone (DESYREL) 100 MG tablet Take 1 tablet (100 mg total) by mouth at bedtime. 12/12/20 12/24/21  Ival Bible, MD  traZODone (DESYREL) 150 MG tablet Take 150 mg by mouth at bedtime. 12/03/21   [provider]    ___________________________________________________________________________________________________ Physical Exam:    12/24/2021    6:00 PM 12/24/2021    5:00 PM 12/24/2021    4:48 PM  Vitals with BMI  Height '5\' 7"'$     Weight 156 lbs 12 oz    BMI 06.30    Systolic 160 109 323  Diastolic 74 69 85  Pulse 557 103 115     1. General:  in No  Acute distress    Chronically ill   -appearing 2. Psychological: Alert and  Oriented 3. Head/ENT:    Dry Mucous Membranes                          Head Non traumatic, neck supple                            Poor Dentition 4. SKIN:  decreased Skin turgor,  Skin clean Dry and intact no  rash 5. Heart: Regular rate and rhythm no*** Murmur, no Rub or gallop 6. Lungs: ***Clear to auscultation bilaterally, no wheezes or crackles   7. Abdomen: Soft, ***non-tender, Non distended *** obese ***bowel sounds present 8. Lower extremities: no clubbing, cyanosis, no ***edema 9. Neurologically Grossly intact, moving all 4 extremities equally  mild tremor 10. MSK: Normal range of motion    Chart has been reviewed  ______________________________________________________________________________________________  Assessment/Plan  51 y.o. male with medical history significant of [name], DM 2  Admitted for   Gastrointestinal hemorrhage, unspecified gastrointestinal hemorrhage type     Present on Admission:  GIB (gastrointestinal bleeding)  Alcohol abuse  Thrombocytopenia (HCC)  Cirrhosis of liver (HCC)     GIB (gastrointestinal bleeding)  - [place] score   Hg <66M    Melena  >1 Justifies admission and aggressive management      Modifying risk factors include:  alcohol abuse  liver disease     Admit to progressive given above    - ER  Provider spoke to gastroenterology ( [facility] ) they will see patient in a.m. appreciate their consult   - serial CBC.    - Monitor for any recurrence,  evidence of hemodynamic instability or significant blood loss  - Transfuse as needed for hemoglobin below 7 or evidence of life-threatening bleeding  - Establish at least 2 PIV and fluid resuscitate   - clear liquids for tonight keep nothing by mouth post midnight,   -  administer Protonix  drip   -  Administer octreotide given possibility of variceal bleeding,    -    Given suspected Variceal bleed initiate antibiotics   IV Cefrtiaxone      Alcohol abuse Spoke about importance of quitting order CIWA protocol  Thrombocytopenia ([place]) We will repeat CBC and if confirmed will need to transfuse aiming for at least platelet count above 50 while having acute GI bleed  Cirrhosis of  liver (HCC)   MELD-Na: 22 at 12/24/2021 11:43 AM In the setting of ongoing alcohol abuse poor prognosis Noted thrombocytopenia elevated INR acute GI bleed worrisome signs Appreciate [facility] consult  DM (diabetes mellitus), type 2 ([place])  - Order Sensitive  SSI     - Hold by mouth medications*     Other plan as per orders.  DVT prophylaxis:  SCD      Code Status:    Code Status: Prior FULL CODE  as per patient   I had personally discussed CODE STATUS with patient      Family Communication:   Family not at  Bedside    Disposition Plan:    To home once workup is complete and patient is stable   Following barriers for discharge:                            Electrolytes corrected                               Anemia plt corrected                             Pain controlled with PO medications                               Afebrile, white count improving able to transition to PO antibiotics                             Will need to be able to tolerate PO                                                     Will need consultants to evaluate patient prior to discharge                       Would benefit from PT/OT eval prior to DC  Ordered                                   Transition of care consulted                   Nutrition    consulted                                    Consults called: [facility]  Admission status:  ED Disposition     ED Disposition  Admit   Condition  --   [place]: [place] [[id number]]  Level of Care: Progressive [102]  Admit to Progressive based on following criteria: GI, ENDOCRINE disease patients with GI bleeding, acute liver failure or pancreatitis, stable with diabetic ketoacidosis or thyrotoxicosis (hypothyroid) state.  May admit patient to [facility] or [facility] if equivalent level of care is available:: Yes  Interfacility transfer: Yes  Covid Evaluation: Asymptomatic - no recent exposure (last 10 days)  testing not required  Diagnosis: GIB (gastrointestinal bleeding) [[id number]]  Admitting Physician: Jackelyn Knife [[id number]]  Attending Physician: CURATOLO, ADAM [[id number]]  Certification:: I certify this patient will need inpatient services for at least 2 midnights  Estimated Length of Stay: 2  inpatient     I Expect 2 midnight stay secondary to severity of patient's current illness need for inpatient interventions justified by the following:  hemodynamic instability despite optimal treatment (tachycardia )  Severe lab/radiological/exam abnormalities including:    thrombocytopenia and extensive comorbidities including:  substance abuse   DM2    That are currently affecting medical management.   I expect  patient to be hospitalized for 2 midnights requiring inpatient medical care.  Patient is at high risk for adverse outcome (such as loss of life or disability) if not treated.  Indication for inpatient stay as follows:    Hemodynamic instability despite maximal medical therapy,    Need for operative/procedural  intervention     Need for IV antibiotics, IV fluids, IV PPI    Level of care      progressive tele indefinitely please discontinue once patient no longer qualifies COVID-19 Labs     Nabeeha Badertscher 12/24/2021, 11:00 PM    [facility]     after 2 AM please page floor coverage PA If 7AM-7PM, please contact the day team taking care of the patient using [email or web address]   Patient was evaluated in the context of the global COVID-19 pandemic, which necessitated consideration that the patient might be at risk for infection with the SARS-CoV-2 virus that causes COVID-19. Institutional protocols and algorithms that pertain to the evaluation of patients at risk for COVID-19 are in a state of rapid change based on information released by regulatory bodies including the CDC and federal and state organizations. These policies and algorithms were followed during  the patient's care.

## 2021-12-24 NOTE — ED Notes (Signed)
After report was given to [facility], patient got bed assigned. Dr Billy Fischer aware

## 2021-12-24 NOTE — ED Notes (Signed)
Sent secure chat to inpatient nurse for report

## 2021-12-24 NOTE — ED Provider Notes (Addendum)
[facility] EMERGENCY DEPARTMENT Provider Note   CSN: [id number] Arrival date & time: 12/24/21  1116     History  Chief Complaint  Patient presents with   Hematemesis   Alcohol Problem    [name] is a 51 y.o. male.  Patient here with blood in his vomit and dark stools.  History of alcohol abuse.  Continues to drink daily.  Had 2 beers prior to coming here.  Usually has about 12 beers a day.  Last several days he has had having black emesis, dark stools.  Sometimes upper abdominal pain.  He does not have any major discomfort currently.  Denies any active nausea or vomiting.  He denies any large-volume hematemesis at this time.  Denies any history of GI bleed.  Denies any history of EGD or colonoscopy.  Last drink was prior to coming here.  No chest pain or shortness of breath or current abdominal pain.  The history is provided by the patient.       Home Medications Prior to Admission medications   Medication Sig Start Date End Date Taking? Authorizing Provider  chlordiazePOXIDE (LIBRIUM) 25 MG capsule '50mg'$  PO TID x 1D, then 25-'50mg'$  PO BID X 1D, then 25-'50mg'$  PO QD X 1D 09/23/21   Dykstra, Ellwood Dense, MD  folic acid (FOLVITE) 1 MG tablet Take 1 tablet (1 mg total) by mouth daily. 09/23/21   Lucrezia Starch, MD  metFORMIN (GLUCOPHAGE) 500 MG tablet Take 1 tablet (500 mg total) by mouth 2 (two) times daily with a meal. 09/23/21   Dykstra, Ellwood Dense, MD  Multiple Vitamin (MULTIVITAMIN WITH MINERALS) TABS tablet Take 1 tablet by mouth daily. 12/13/20   Ival Bible, MD  traZODone (DESYREL) 100 MG tablet Take 1 tablet (100 mg total) by mouth at bedtime. 12/12/20 01/11/21  Ival Bible, MD      Allergies    Patient has no known allergies.    Review of Systems   Review of Systems  Physical Exam Updated Vital Signs BP 124/72   Pulse (!) 52   Temp 97.9 F (36.6 C) (Oral)   Resp 16   Ht '5\' 7"'$  (1.702 m)   Wt 70.3 kg   SpO2 97%   BMI 24.28 kg/m   Physical Exam Vitals and nursing note reviewed.  Constitutional:      General: He is not in acute distress.    Appearance: He is well-developed. He is not ill-appearing.  HENT:     Head: Normocephalic and atraumatic.     Nose: Nose normal.     Mouth/Throat:     Mouth: Mucous membranes are moist.  Eyes:     Extraocular Movements: Extraocular movements intact.     Conjunctiva/sclera: Conjunctivae normal.     Pupils: Pupils are equal, round, and reactive to light.  Cardiovascular:     Rate and Rhythm: Regular rhythm. Tachycardia present.     Heart sounds: No murmur heard. Pulmonary:     Effort: Pulmonary effort is normal. No respiratory distress.     Breath sounds: Normal breath sounds.  Abdominal:     General: Abdomen is flat. There is no distension.     Palpations: Abdomen is soft.     Tenderness: There is no abdominal tenderness.  Genitourinary:    Rectum: Guaiac result positive.  Musculoskeletal:        General: No swelling.     Cervical back: Normal range of motion and neck supple.  Skin:  General: Skin is warm and dry.     Capillary Refill: Capillary refill takes less than 2 seconds.  Neurological:     General: No focal deficit present.     Mental Status: He is alert.  Psychiatric:        Mood and Affect: Mood normal.     ED Results / Procedures / Treatments   Labs (all labs ordered are listed, but only abnormal results are displayed) Labs Reviewed  CBC WITH DIFFERENTIAL/PLATELET - Abnormal; Notable for the following components:      Result Value   RBC 3.70 (*)    Hemoglobin 9.9 (*)    HCT 28.0 (*)    MCV 75.7 (*)    RDW 17.2 (*)    Platelets 35 (*)    All other components within normal limits  COMPREHENSIVE METABOLIC PANEL - Abnormal; Notable for the following components:   Sodium 128 (*)    Chloride 93 (*)    CO2 20 (*)    Glucose, Bld 205 (*)    Calcium 8.1 (*)    Total Protein 6.1 (*)    AST 422 (*)    ALT 303 (*)    Total Bilirubin 2.1 (*)     All other components within normal limits  PROTIME-INR - Abnormal; Notable for the following components:   Prothrombin Time 19.2 (*)    INR 1.6 (*)    All other components within normal limits  ETHANOL - Abnormal; Notable for the following components:   Alcohol, Ethyl (B) 123 (*)    All other components within normal limits  OCCULT BLOOD X 1 CARD TO LAB, STOOL - Abnormal; Notable for the following components:   Fecal Occult Bld POSITIVE (*)    All other components within normal limits  LIPASE, BLOOD  POC OCCULT BLOOD, ED    EKG EKG Interpretation  Date/Time:  Tuesday December 24 2021 11:43:00 EDT Ventricular Rate:  118 PR Interval:  118 QRS Duration: 97 QT Interval:  366 QTC Calculation: 446 R Axis:   54 Text Interpretation: Sinus tachycardia Paired ventricular premature complexes Confirmed by Ronnald Nian, Roddie Riegler ([id number]) on 12/24/2021 11:54:52 AM  Radiology CT ANGIO GI BLEED  Result Date: 12/24/2021 CLINICAL DATA:  Left upper quadrant pain. Possible pancreatitis. Evaluate for GI bleed. Vomiting last night black in nature. Alcohol abuse. EXAM: CTA ABDOMEN AND PELVIS WITHOUT AND WITH CONTRAST TECHNIQUE: Multidetector CT imaging of the abdomen and pelvis was performed using the standard protocol during bolus administration of intravenous contrast. Multiplanar reconstructed images and MIPs were obtained and reviewed to evaluate the vascular anatomy. RADIATION DOSE REDUCTION: This exam was performed according to the departmental dose-optimization program which includes automated exposure control, adjustment of the mA and/or kV according to patient size and/or use of iterative reconstruction technique. CONTRAST:  185m OMNIPAQUE IOHEXOL 350 MG/ML SOLN COMPARISON:  08/06/2012 FINDINGS: VASCULAR Aorta: Normal in caliber without evidence of aneurysm or dissection. Minimal calcified plaque is present. Celiac: Patent without evidence of aneurysm, dissection, vasculitis or significant stenosis. SMA: Patent  without evidence of aneurysm, dissection, vasculitis or significant stenosis. Renals: Both renal arteries are patent without evidence of aneurysm, dissection, vasculitis, fibromuscular dysplasia or significant stenosis. There is an accessory right renal artery which is patent. Mild calcified plaque over the proximal more inferior right renal artery. IMA: Patent without evidence of aneurysm, dissection, vasculitis or significant stenosis. Inflow: Patent without evidence of aneurysm, dissection, vasculitis or significant stenosis. Proximal Outflow: Bilateral common femoral and visualized portions of the superficial and  profunda femoral arteries are patent without evidence of aneurysm, dissection, vasculitis or significant stenosis. Veins: No obvious venous abnormality within the limitations of this arterial phase study. Review of the MIP images confirms the above findings. NON-VASCULAR Lower chest: Lung bases are clear. Moderate size hiatal hernia is present. Hepatobiliary: There is a 4.6 cm well-defined oval mass over the right lobe of the liver with hypodense center. Small hypodense liver with nodular contour compatible with cirrhosis. Gallbladder and biliary tree are normal. Pancreas: Normal. Spleen: Mild splenomegaly. Adrenals/Urinary Tract: Adrenal glands are normal. Kidneys are normal in size without hydronephrosis or nephrolithiasis. Bladder and ureters are normal. Stomach/Bowel: Moderate size hiatal hernia. Small bowel is otherwise unremarkable. Appendix is normal. Colon is unremarkable. Small amount of retained oral contrast over the terminal ileum and cecum. Lymphatic: No significant adenopathy. Reproductive: Normal. Other: No free fluid or focal inflammatory change. Musculoskeletal: No focal abnormality. IMPRESSION: VASCULAR: 1.  No evidence of acute GI bleed. 2.  Mild aortic atherosclerosis. NONVASCULAR: 1.  No acute findings in the abdomen/pelvis. 2. Evidence of patient's known cirrhosis with associated  splenomegaly. There is a 4.6 cm mass over the right lobe of the liver suspicious for hepatocellular carcinoma. Recommend MRI for further evaluation. 3.  Moderate size hiatal hernia. Electronically Signed   By: Marin Olp M.D.   On: 12/24/2021 12:58   DG Chest Portable 1 View  Result Date: 12/24/2021 CLINICAL DATA:  Chest pain EXAM: PORTABLE CHEST 1 VIEW COMPARISON:  Chest radiograph 08/10/2012 FINDINGS: The heart size and mediastinal contours are within normal limits. Both lungs are clear. The visualized skeletal structures are unremarkable. IMPRESSION: No radiographic finding to explain chest pain Electronically Signed   By: Marin Roberts M.D.   On: 12/24/2021 12:04    Procedures .Critical Care  Performed by: Lennice Sites, DO Authorized by: Lennice Sites, DO   Critical care provider statement:    Critical care time (minutes):  35   Critical care was necessary to treat or prevent imminent or life-threatening deterioration of the following conditions:  Circulatory failure   Critical care was time spent personally by me on the following activities:  Blood draw for specimens, development of treatment plan with patient or surrogate, discussions with consultants, discussions with primary provider, evaluation of patient's response to treatment, obtaining history from patient or surrogate, ordering and performing treatments and interventions, ordering and review of laboratory studies, pulse oximetry, re-evaluation of patient's condition and ordering and review of radiographic studies   Care discussed with: admitting provider       Medications Ordered in ED Medications  pantoprozole (PROTONIX) 80 mg /NS 100 mL infusion (has no administration in time range)  pantoprazole (PROTONIX) injection 40 mg (has no administration in time range)  LORazepam (ATIVAN) injection 0-4 mg (has no administration in time range)    Or  LORazepam (ATIVAN) tablet 0-4 mg (has no administration in time range)   LORazepam (ATIVAN) injection 0-4 mg (has no administration in time range)    Or  LORazepam (ATIVAN) tablet 0-4 mg (has no administration in time range)  thiamine (VITAMIN B1) tablet 100 mg ( Oral See Alternative 12/24/21 1209)    Or  thiamine (VITAMIN B1) injection 100 mg (100 mg Intravenous Given 12/24/21 1209)  pantoprazole (PROTONIX) 40 MG injection (has no administration in time range)  ondansetron (ZOFRAN) injection 4 mg (4 mg Intravenous Given 12/24/21 1157)  sodium chloride 0.9 % bolus 1,000 mL (1,000 mLs Intravenous New Bag/Given 12/24/21 1157)  pantoprazole (PROTONIX) 80 mg /NS  100 mL IVPB (80 mg Intravenous New Bag/Given 12/24/21 1248)  LORazepam (ATIVAN) injection 1 mg (1 mg Intravenous Given 12/24/21 1210)  iohexol (OMNIPAQUE) 350 MG/ML injection 100 mL (100 mLs Intravenous Contrast Given 12/24/21 1218)    ED Course/ Medical Decision Making/ A&P                           Medical Decision Making Amount and/or Complexity of Data Reviewed Labs: ordered. Radiology: ordered.  Risk OTC drugs. Prescription drug management. Decision regarding hospitalization.   Miklos Bidinger is here with black stools and vomit.  History of alcohol abuse.  Normal vitals.  No fever.  Mildly tachycardic.  Differential diagnosis is GI bleed, gastritis/ulcer, less likely varices.  Seems less likely to be pancreatitis.  He has had dark stools for about 2 to 3 days.  He has had some dark vomit and the last day or 2 as well.  No large-volume bright red blood.  Some cramping abdominal pain at times.  We will get CBC, CMP, lipase, CT angio abdomen pelvis, will start IV Protonix bolus and infusion and fluid bolus.  Suspect could be GI bleed given black stool on exam.  Per my review and interpretation of labs patient with hemoglobin of 9.9.  This is significantly down from hemoglobin of 13.4 yesterday.  Liver enzymes elevated likely in the setting of chronic alcohol abuse.  INR is 1.6.  Alcohol level is 123.   Hemoccult positive.  Overall suspect GI bleed likely upper GI bleed in the setting of chronic alcohol use.  I have much lower concern for variceal bleed but certainly on the differential.  CT scan is pending.  We will talk with GI team and will admit to medicine.  CT scan shows no obvious bleed.  No pancreatitis.  No perforation.  Does have evidence of cirrhosis.  Does have may be a 4.6 cm mass over the right lobe of the liver suspicious for hepatocellular carcinoma.  Talked with Dr. Therisa Doyne with [facility].  They will evaluate the patient when arrives at hospital.  We will start him on IV octreotide and Rocephin.  Will admit to medicine service for further care, will try to get him transferred to [facility] ED as well.  Would benefit from platelet transfusion.  At this time, patient is hemodynamically stable.  There is significant bed holding at [facility] and will work to transfer him ED to ED when able.  We will continue to trend CBC.  This chart was dictated using voice recognition software.  Despite best efforts to proofread,  errors can occur which can change the documentation meaning.    Final Clinical Impression(s) / ED Diagnoses Final diagnoses:  Gastrointestinal hemorrhage, unspecified gastrointestinal hemorrhage type    Rx / DC Orders ED Discharge Orders     None         Lennice Sites, DO 12/24/21 [place], [place], DO 12/24/21 [place], [place], DO 12/24/21 [place], [place], DO 12/24/21 1347

## 2021-12-24 NOTE — ED Notes (Signed)
Patient transported to CT 

## 2021-12-24 NOTE — ED Notes (Signed)
[facility] called and they are 10 min away

## 2021-12-24 NOTE — ED Notes (Signed)
Pt is sleeping at this time. Call bell is within reach.

## 2021-12-24 NOTE — Subjective & Objective (Signed)
Presents with ground coffee emesis yesterday continues to drink had at least 2 beers this morning usually drinks about 12 beers per day Started to have blood in the vomit and dark stools tarry stools for the past few days.  Also reports epigastric pain no large-volume hematemesis at this time no prior history of GI bleed never had a EGD or colonoscopy.  Denies any chest pain shortness of breath

## 2021-12-24 NOTE — Progress Notes (Signed)
Plan of Care Note for accepted transfer   Patient: [name] MRN: [id number]   DOA: 12/24/2021  Facility requesting transfer: [facility] Requesting Provider: Dr. Ronnald Nian Reason for transfer: GIB Facility course: 51 yo M w/ PMHx of HTN, anxiety, EtOH abuse, DM. Presenting with hematemesis. W/u revealed 3.5 Hgb drop since last check, thrombocytopenia, positive FOBT. EDP spoke with [facility]. Will follow. He was started on CIWA, rocephin, protonix, octreotide.   Plan of care: The patient is accepted for admission to Progressive unit, at [facility]..  While holding at [facility], medical decision making responsibilities remain with the EDP. Upon arrival to [facility], [facility] will assume care. Thank you.   Author: Jonnie Finner, DO 12/24/2021  Check [email or web address] for on-call coverage.  Nursing staff, Please call [place] number on Amion as soon as patient's arrival, so appropriate admitting provider can evaluate the pt.

## 2021-12-24 NOTE — ED Notes (Signed)
Date and time results received: 12/24/21 now  (use smartphrase ".now" to insert current time)  Test: CBC Critical Value: Platelets 21  Name of Provider Notified: Dr Vevelyn Pat  Orders Received? Or Actions Taken?:  per EDP ED to ED transfer

## 2021-12-24 NOTE — ED Notes (Signed)
[facility] called for transport to [id number]

## 2021-12-25 ENCOUNTER — Encounter (HOSPITAL_COMMUNITY): Payer: Self-pay | Admitting: Internal Medicine

## 2021-12-25 ENCOUNTER — Inpatient Hospital Stay (HOSPITAL_COMMUNITY): Payer: Self-pay

## 2021-12-25 DIAGNOSIS — R7989 Other specified abnormal findings of blood chemistry: Secondary | ICD-10-CM | POA: Diagnosis present

## 2021-12-25 LAB — CBC
HCT: 21.2 % — ABNORMAL LOW (ref 39.0–52.0)
HCT: 21.1 % — ABNORMAL LOW (ref 39.0–52.0)
HCT: 22.5 % — ABNORMAL LOW (ref 39.0–52.0)
Hemoglobin: 7.5 g/dL — ABNORMAL LOW (ref 13.0–17.0)
Hemoglobin: 7.3 g/dL — ABNORMAL LOW (ref 13.0–17.0)
Hemoglobin: 7.5 g/dL — ABNORMAL LOW (ref 13.0–17.0)
MCH: 28.2 pg (ref 26.0–34.0)
MCH: 27.9 pg (ref 26.0–34.0)
MCH: 27.1 pg (ref 26.0–34.0)
MCHC: 35.4 g/dL (ref 30.0–36.0)
MCHC: 34.6 g/dL (ref 30.0–36.0)
MCHC: 33.3 g/dL (ref 30.0–36.0)
MCV: 79.7 fL — ABNORMAL LOW (ref 80.0–100.0)
MCV: 80.5 fL (ref 80.0–100.0)
MCV: 81.2 fL (ref 80.0–100.0)
Platelets: 22 10*3/uL — CL (ref 150–400)
Platelets: 21 10*3/uL — CL (ref 150–400)
Platelets: 21 10*3/uL — CL (ref 150–400)
RBC: 2.66 MIL/uL — ABNORMAL LOW (ref 4.22–5.81)
RBC: 2.62 MIL/uL — ABNORMAL LOW (ref 4.22–5.81)
RBC: 2.77 MIL/uL — ABNORMAL LOW (ref 4.22–5.81)
RDW: 17.7 % — ABNORMAL HIGH (ref 11.5–15.5)
RDW: 17.6 % — ABNORMAL HIGH (ref 11.5–15.5)
RDW: 17.6 % — ABNORMAL HIGH (ref 11.5–15.5)
WBC: 2.1 10*3/uL — ABNORMAL LOW (ref 4.0–10.5)
WBC: 1.9 10*3/uL — ABNORMAL LOW (ref 4.0–10.5)
WBC: 1.9 10*3/uL — ABNORMAL LOW (ref 4.0–10.5)
nRBC: 0 % (ref 0.0–0.2)
nRBC: 0 % (ref 0.0–0.2)
nRBC: 0 % (ref 0.0–0.2)

## 2021-12-25 LAB — COMPREHENSIVE METABOLIC PANEL
ALT: 221 U/L — ABNORMAL HIGH (ref 0–44)
AST: 227 U/L — ABNORMAL HIGH (ref 15–41)
Albumin: 3.1 g/dL — ABNORMAL LOW (ref 3.5–5.0)
Alkaline Phosphatase: 37 U/L — ABNORMAL LOW (ref 38–126)
Anion gap: 6 (ref 5–15)
BUN: 14 mg/dL (ref 6–20)
CO2: 25 mmol/L (ref 22–32)
Calcium: 7.7 mg/dL — ABNORMAL LOW (ref 8.9–10.3)
Chloride: 104 mmol/L (ref 98–111)
Creatinine, Ser: 0.58 mg/dL — ABNORMAL LOW (ref 0.61–1.24)
GFR, Estimated: >60 mL/min (ref >60)
Glucose, Bld: 157 mg/dL — ABNORMAL HIGH (ref 70–99)
Potassium: 3.4 mmol/L — ABNORMAL LOW (ref 3.5–5.1)
Sodium: 135 mmol/L (ref 135–145)
Total Bilirubin: 1.4 mg/dL — ABNORMAL HIGH (ref 0.3–1.2)
Total Protein: 5.2 g/dL — ABNORMAL LOW (ref 6.5–8.1)

## 2021-12-25 LAB — URINALYSIS, COMPLETE (UACMP) WITH MICROSCOPIC
Bacteria, UA: NONE SEEN
Bilirubin Urine: NEGATIVE
Glucose, UA: >=500 mg/dL — AB
Ketones, ur: 20 mg/dL — AB
Leukocytes,Ua: NEGATIVE
Nitrite: NEGATIVE
Protein, ur: NEGATIVE mg/dL
Specific Gravity, Urine: 1.022 (ref 1.005–1.030)
pH: 6 (ref 5.0–8.0)

## 2021-12-25 LAB — OSMOLALITY, URINE: Osmolality, Ur: 685 mOsm/kg (ref 300–900)

## 2021-12-25 LAB — GLUCOSE, CAPILLARY
Glucose-Capillary: 124 mg/dL — ABNORMAL HIGH (ref 70–99)
Glucose-Capillary: 133 mg/dL — ABNORMAL HIGH (ref 70–99)
Glucose-Capillary: 142 mg/dL — ABNORMAL HIGH (ref 70–99)
Glucose-Capillary: 142 mg/dL — ABNORMAL HIGH (ref 70–99)
Glucose-Capillary: 152 mg/dL — ABNORMAL HIGH (ref 70–99)
Glucose-Capillary: 229 mg/dL — ABNORMAL HIGH (ref 70–99)

## 2021-12-25 LAB — HEPATITIS B SURFACE ANTIGEN: Hepatitis B Surface Ag: NONREACTIVE

## 2021-12-25 LAB — MAGNESIUM: Magnesium: 2.1 mg/dL (ref 1.7–2.4)

## 2021-12-25 LAB — T4, FREE: Free T4: 0.81 ng/dL (ref 0.61–1.12)

## 2021-12-25 LAB — HEPATITIS PANEL, ACUTE
HCV Ab: NONREACTIVE
Hep A IgM: NONREACTIVE
Hep B C IgM: NONREACTIVE
Hepatitis B Surface Ag: NONREACTIVE

## 2021-12-25 LAB — PHOSPHORUS: Phosphorus: 1.8 mg/dL — ABNORMAL LOW (ref 2.5–4.6)

## 2021-12-25 LAB — PREALBUMIN: Prealbumin: 9 mg/dL — ABNORMAL LOW (ref 18–38)

## 2021-12-25 LAB — CREATININE, URINE, RANDOM: Creatinine, Urine: 123 mg/dL

## 2021-12-25 LAB — SODIUM, URINE, RANDOM: Sodium, Ur: <10 mmol/L

## 2021-12-25 LAB — OSMOLALITY: Osmolality: 291 mOsm/kg (ref 275–295)

## 2021-12-25 LAB — HIV ANTIBODY (ROUTINE TESTING W REFLEX): HIV Screen 4th Generation wRfx: NONREACTIVE

## 2021-12-25 MED ORDER — SODIUM CHLORIDE 0.9% IV SOLUTION: Freq: Once | INTRAVENOUS | Status: AC

## 2021-12-25 MED ORDER — MORPHINE SULFATE (PF) 2 MG/ML IV SOLN
1.0000 mg | INTRAVENOUS | Status: DC | PRN
  Administered 2021-12-26 (×2): 1 mg via INTRAVENOUS
  Filled 2021-12-25 (×2): qty 1

## 2021-12-25 MED ORDER — GADOPICLENOL 0.5 MMOL/ML IV SOLN
7.1000 mL | Freq: Once | INTRAVENOUS | Status: AC | PRN
  Administered 2021-12-25: 7.1 mL via INTRAVENOUS

## 2021-12-25 MED ORDER — DIAZEPAM 5 MG PO TABS
5.0000 mg | ORAL_TABLET | Freq: Two times a day (BID) | ORAL | Status: DC
  Administered 2021-12-25 – 2021-12-27 (×4): 5 mg via ORAL
  Filled 2021-12-25 (×4): qty 1

## 2021-12-25 MED ORDER — SODIUM CHLORIDE 0.9 % IV SOLN: INTRAVENOUS | Status: DC

## 2021-12-25 NOTE — Progress Notes (Signed)
Patient's temp 99.7, Dr. Roel Cluck notified, okayed to start blood.

## 2021-12-25 NOTE — Progress Notes (Signed)
Initial Nutrition Assessment  INTERVENTION:   Once diet advanced: -Ensure MAX Protein po BID, each supplement provides 150 kcal and 30 grams of protein  -Multivitamin with minerals daily -B complex w/ Vitamin C -Monitor magnesium, potassium, and phosphorus BID for at least 3 days, MD to replete as needed, as pt is at risk for refeeding syndrome.  -Will place "Low sodium" handout in AVS  NUTRITION DIAGNOSIS:   Increased nutrient needs related to chronic illness (alcohol abuse, liver cirrhosis) as evidenced by estimated needs.  GOAL:   Patient will meet greater than or equal to 90% of their needs  MONITOR:   PO intake, Supplement acceptance, Diet advancement, Labs, Weight trends, I & O's  REASON FOR ASSESSMENT:   Consult Assessment of nutrition requirement/status  ASSESSMENT:   51 y.o. male with past medical history of alcohol abuse, anxiety, diabetes, DVT, hypertension.   Presented to the emergency room for evaluation of hematemesis 12/24/2021.  Patient in room, family later joined at bedside. Pt reports poor appetite, has not eaten anything for 4 days PTA. Last ate 1/2 sub on 9/30. Per family, has been eating less and less the more he has started to drink again. Was drinking 12 beers daily for about a month. Was hospitalized in July for withdrawal and had been drinking heavily for 4-5 months prior to that admission.  Pt does not take any vitamins at home, does not follow a low sodium diet despite liver cirrhosis. Will provide information in AVS.  Pt was on clears, now NPO prior to MRI today. Will be NPO tomorrow for EGD.  Pt agreeable to protein supplements and MVI following diet advancement.  Pt reports UBW of 165 lbs.  Per weight records, pt lost 9 lbs since 3/11 (5% wt loss x 7 months, insignificant for time frame). Pt at risk of malnutrition if alcohol abuse continues.   Medications: Thiamine  Labs reviewed: CBGs: 142-229 Low K Low Phos  Low folate  NUTRITION -  FOCUSED PHYSICAL EXAM:  Flowsheet Row Most Recent Value  Orbital Region No depletion  Upper Arm Region No depletion  Thoracic and Lumbar Region No depletion  Buccal Region No depletion  Temple Region Mild depletion  Clavicle Bone Region No depletion  Clavicle and Acromion Bone Region No depletion  Scapular Bone Region No depletion  Dorsal Hand No depletion  Patellar Region No depletion  Anterior Thigh Region No depletion  Posterior Calf Region No depletion  Edema (RD Assessment) None  Hair Reviewed  Eyes Reviewed  Mouth Reviewed  Skin Reviewed       Diet Order:   Diet Order             Diet NPO time specified  Diet effective midnight           Diet clear liquid Room service appropriate? Yes; Fluid consistency: Thin  Diet effective now                   EDUCATION NEEDS:   Education needs have been addressed  Skin:  Skin Assessment: Reviewed RN Assessment  Last BM:  10/3 -type 7  Height:   Ht Readings from Last 1 Encounters:  12/24/21 '5\' 7"'$  (1.702 m)    Weight:   Wt Readings from Last 1 Encounters:  12/24/21 71.1 kg    BMI:  Body mass index is 24.55 kg/m.  Estimated Nutritional Needs:   Kcal:  2150-2350  Protein:  105-115g  Fluid:  2L/day   Clayton Bibles, MS, RD, LDN  Inpatient Clinical Dietitian Contact information available via Amion

## 2021-12-25 NOTE — Progress Notes (Signed)
Patient in pain and Patient's heart rate elevated, MD aware, will continue to monitor patient.    12/24/21 1800  Assess: MEWS Score  Temp 98.8 F (37.1 C)  BP 121/74  MAP (mmHg) 89  Pulse Rate (!) 111  Resp 20  SpO2 98 %  O2 Device Room Air  Assess: MEWS Score  MEWS Temp 0  MEWS Systolic 0  MEWS Pulse 2  MEWS RR 0  MEWS LOC 0  MEWS Score 2  MEWS Score Color Yellow  Assess: if the MEWS score is Yellow or Red  Were vital signs taken at a resting state? Yes  Focused Assessment No change from prior assessment  Does the patient meet 2 or more of the SIRS criteria? Yes  Does the patient have a confirmed or suspected source of infection? No  Provider and Rapid Response Notified? No  MEWS guidelines implemented *See Row Information* Yes  Assess: SIRS CRITERIA  SIRS Temperature  0  SIRS Pulse 1  SIRS Respirations  0  SIRS WBC 1  SIRS Score Sum  2

## 2021-12-25 NOTE — Discharge Instructions (Signed)

## 2021-12-25 NOTE — H&P (View-Only) (Signed)
Referring Provider: [facility] Primary Care Physician:  [name], MD Primary Gastroenterologist:  Althia Forts  Reason for Consultation:  hematemesis  HPI: [name] is a 51 y.o. male with past medical history of alcohol abuse, anxiety, diabetes, DVT, hypertension.  Presented to the emergency room for evaluation of hematemesis 12/24/2021.   Patient reports starting on Sunday he drank about 10 beers while watching a race.  He initially felt fine went to bed at 8 PM, at 9 PM he began to have some lower abdominal pain and went to the bathroom and had coffee-ground emesis.  Notes he had 3-4 episodes of coffee-ground emesis on Sunday.  Since Sunday he has also begun to have black stools.  He has had 3-4 episodes of black stools last noticed yesterday afternoon.  Given black stools and black emesis he decided to present to the emergency room.  In the ED, hemoglobin 9.9, PLT 35, AST/ALT 422/303, T. bili 2.1, INR 1.6, PT 19.2, ethanol 123, FOBT positive.  CT angio GI bleed with evidence of cirrhosis and 4.6 cm mass in the right lobe of the liver for suspicious for HCC.  Patient was started on IV octreotide and Rocephin.  Suspected upper GI bleed, [facility] was consulted.  Patient has history of drinking about 8 beers daily since he was in high school.  Denies liquor or wine use.  He had previously quit drinking alcohol for 9-1/2 years but recently restarted about 3 months ago.  Denies NSAID use Denies blood thinner use No previous EGD or colonoscopy.   Past Medical History:  Diagnosis Date   Alcoholic ([place])    Anxiety    Diabetes ([place])    DVT (deep venous thrombosis) ([place])    Hypertension     Past Surgical History:  Procedure Laterality Date   BLOOD PATCH     TONSILLECTOMY      Prior to Admission medications   Medication Sig Start Date End Date Taking? Authorizing Provider  ALPRAZolam Duanne Moron) 0.5 MG tablet Take 0.5 mg by mouth daily as needed. 10/15/21   [provider]   chlordiazePOXIDE (LIBRIUM) 25 MG capsule '50mg'$  PO TID x 1D, then 25-'50mg'$  PO BID X 1D, then 25-'50mg'$  PO QD X 1D 09/23/21   Lucrezia Starch, MD  folic acid (FOLVITE) 1 MG tablet Take 1 tablet (1 mg total) by mouth daily. 09/23/21   Lucrezia Starch, MD  metFORMIN (GLUCOPHAGE) 500 MG tablet Take 1 tablet (500 mg total) by mouth 2 (two) times daily with a meal. 09/23/21   Dykstra, Ellwood Dense, MD  Multiple Vitamin (MULTIVITAMIN WITH MINERALS) TABS tablet Take 1 tablet by mouth daily. 12/13/20   Ival Bible, MD  traZODone (DESYREL) 100 MG tablet Take 1 tablet (100 mg total) by mouth at bedtime. 12/12/20 12/24/21  Ival Bible, MD  traZODone (DESYREL) 150 MG tablet Take 150 mg by mouth at bedtime. 12/03/21   [provider]    Scheduled Meds:  insulin aspart  0-9 Units Subcutaneous Q4H   LORazepam  0-4 mg Intravenous Q6H   Or   LORazepam  0-4 mg Oral Q6H   [START ON 12/26/2021] LORazepam  0-4 mg Intravenous Q12H   Or   [START ON 12/26/2021] LORazepam  0-4 mg Oral Q12H   [START ON 12/28/2021] pantoprazole  40 mg Intravenous Q12H   thiamine (VITAMIN B1) injection  100 mg Intravenous Daily   Continuous Infusions:  sodium chloride 20 mL/hr at 12/25/21 0619   cefTRIAXone (ROCEPHIN)  IV  octreotide (SANDOSTATIN) 500 mcg in sodium chloride 0.9 % 250 mL (2 mcg/mL) infusion 50 mcg/hr (12/24/21 2309)   pantoprazole 8 mg/hr (12/24/21 2311)   PRN Meds:.acetaminophen **OR** acetaminophen, albuterol, HYDROcodone-acetaminophen, morphine injection  Allergies as of 12/24/2021   (No Known Allergies)    Family History  Problem Relation Age of Onset   Hypertension Father     Social History   Socioeconomic History   Marital status: Married    Spouse name: Not on file   Number of children: Not on file   Years of education: Not on file   Highest education level: Not on file  Occupational History   Not on file  Tobacco Use   Smoking status: Never   Smokeless tobacco: Never   Vaping Use   Vaping Use: Never used  Substance and Sexual Activity   Alcohol use: Yes    Alcohol/week: 12.0 standard drinks of alcohol    Types: 12 Cans of beer per week    Comment: Beer/Hourly   Drug use: No    Comment: marijuana in the past   Sexual activity: Not on file  Other Topics Concern   Not on file  Social History Narrative   Pt works odd Architect jobs and delivers furniture. Lives with fiancee in [place], [place].    Social Determinants of Health   Financial Resource Strain: Not on file  Food Insecurity: No Food Insecurity (12/24/2021)   Hunger Vital Sign    Worried About Running Out of Food in the Last Year: Never true    Ran Out of Food in the Last Year: Never true  Transportation Needs: No Transportation Needs (12/24/2021)   PRAPARE - Hydrologist (Medical): No    Lack of Transportation (Non-Medical): No  Physical Activity: Not on file  Stress: Not on file  Social Connections: Not on file  Intimate Partner Violence: Not At Risk (12/24/2021)   Humiliation, Afraid, Rape, and Kick questionnaire    Fear of Current or Ex-Partner: No    Emotionally Abused: No    Physically Abused: No    Sexually Abused: No    Review of Systems: All negative except as stated above in HPI.  Physical Exam:Physical Exam Constitutional:      General: He is not in acute distress.    Appearance: He is normal weight.  HENT:     Head: Normocephalic and atraumatic.     Right Ear: External ear normal.     Left Ear: External ear normal.     Nose: Nose normal.     Mouth/Throat:     Mouth: Mucous membranes are moist.  Eyes:     General: No scleral icterus.    Pupils: Pupils are equal, round, and reactive to light.     Comments: Conjunctival pallor  Cardiovascular:     Rate and Rhythm: Normal rate and regular rhythm.     Pulses: Normal pulses.     Heart sounds: Normal heart sounds.  Pulmonary:     Effort: Pulmonary effort is normal.     Breath sounds:  Normal breath sounds.  Abdominal:     General: Abdomen is flat. Bowel sounds are normal. There is no distension.     Palpations: Abdomen is soft. There is no mass.     Tenderness: There is no abdominal tenderness. There is no guarding or rebound.     Hernia: No hernia is present.  Musculoskeletal:        General: Normal range of  motion.     Cervical back: Normal range of motion and neck supple.  Skin:    General: Skin is warm and dry.     Coloration: Skin is pale. Skin is not jaundiced.  Neurological:     General: No focal deficit present.     Mental Status: He is alert and oriented to person, place, and time. Mental status is at baseline.  Psychiatric:        Mood and Affect: Mood normal.        Behavior: Behavior normal.     Vital signs: Vitals:   12/25/21 0423 12/25/21 0500  BP: (!) 141/72 133/60  Pulse: 87 80  Resp: 19 18  Temp: 98.6 F (37 C) 98.7 F (37.1 C)  SpO2: 97% 98%   Last BM Date : 12/24/21    GI:  Lab Results: Recent Labs    12/24/21 1143 12/24/21 1441 12/24/21 2031  WBC 7.3 5.4 3.4*  HGB 9.9* 8.7* 8.0*  HCT 28.0* 24.3* 23.1*  PLT 35* 21* 15*   BMET Recent Labs    12/23/21 1200 12/24/21 1143 12/24/21 2031  NA 134* 128* 131*  K 3.6 3.7 3.8  CL 95* 93* 100  CO2 18* 20* 22  GLUCOSE 163* 205* 191*  BUN '12 15 14  '$ CREATININE 0.54* 0.65 0.60*  CALCIUM 8.8* 8.1* 7.5*   LFT Recent Labs    12/24/21 1143  PROT 6.1*  ALBUMIN 3.6  AST 422*  ALT 303*  ALKPHOS 45  BILITOT 2.1*   PT/INR Recent Labs    12/24/21 1143  LABPROT 19.2*  INR 1.6*     Studies/Results: CT ANGIO GI BLEED  Result Date: 12/24/2021 CLINICAL DATA:  Left upper quadrant pain. Possible pancreatitis. Evaluate for GI bleed. Vomiting last night black in nature. Alcohol abuse. EXAM: CTA ABDOMEN AND PELVIS WITHOUT AND WITH CONTRAST TECHNIQUE: Multidetector CT imaging of the abdomen and pelvis was performed using the standard protocol during bolus administration of  intravenous contrast. Multiplanar reconstructed images and MIPs were obtained and reviewed to evaluate the vascular anatomy. RADIATION DOSE REDUCTION: This exam was performed according to the departmental dose-optimization program which includes automated exposure control, adjustment of the mA and/or kV according to patient size and/or use of iterative reconstruction technique. CONTRAST:  152m OMNIPAQUE IOHEXOL 350 MG/ML SOLN COMPARISON:  08/06/2012 FINDINGS: VASCULAR Aorta: Normal in caliber without evidence of aneurysm or dissection. Minimal calcified plaque is present. Celiac: Patent without evidence of aneurysm, dissection, vasculitis or significant stenosis. SMA: Patent without evidence of aneurysm, dissection, vasculitis or significant stenosis. Renals: Both renal arteries are patent without evidence of aneurysm, dissection, vasculitis, fibromuscular dysplasia or significant stenosis. There is an accessory right renal artery which is patent. Mild calcified plaque over the proximal more inferior right renal artery. IMA: Patent without evidence of aneurysm, dissection, vasculitis or significant stenosis. Inflow: Patent without evidence of aneurysm, dissection, vasculitis or significant stenosis. Proximal Outflow: Bilateral common femoral and visualized portions of the superficial and profunda femoral arteries are patent without evidence of aneurysm, dissection, vasculitis or significant stenosis. Veins: No obvious venous abnormality within the limitations of this arterial phase study. Review of the MIP images confirms the above findings. NON-VASCULAR Lower chest: Lung bases are clear. Moderate size hiatal hernia is present. Hepatobiliary: There is a 4.6 cm well-defined oval mass over the right lobe of the liver with hypodense center. Small hypodense liver with nodular contour compatible with cirrhosis. Gallbladder and biliary tree are normal. Pancreas: Normal. Spleen: Mild splenomegaly. Adrenals/Urinary  Tract:  Adrenal glands are normal. Kidneys are normal in size without hydronephrosis or nephrolithiasis. Bladder and ureters are normal. Stomach/Bowel: Moderate size hiatal hernia. Small bowel is otherwise unremarkable. Appendix is normal. Colon is unremarkable. Small amount of retained oral contrast over the terminal ileum and cecum. Lymphatic: No significant adenopathy. Reproductive: Normal. Other: No free fluid or focal inflammatory change. Musculoskeletal: No focal abnormality. IMPRESSION: VASCULAR: 1.  No evidence of acute GI bleed. 2.  Mild aortic atherosclerosis. NONVASCULAR: 1.  No acute findings in the abdomen/pelvis. 2. Evidence of patient's known cirrhosis with associated splenomegaly. There is a 4.6 cm mass over the right lobe of the liver suspicious for hepatocellular carcinoma. Recommend MRI for further evaluation. 3.  Moderate size hiatal hernia. Electronically Signed   By: Marin Olp M.D.   On: 12/24/2021 12:58   DG Chest Portable 1 View  Result Date: 12/24/2021 CLINICAL DATA:  Chest pain EXAM: PORTABLE CHEST 1 VIEW COMPARISON:  Chest radiograph 08/10/2012 FINDINGS: The heart size and mediastinal contours are within normal limits. Both lungs are clear. The visualized skeletal structures are unremarkable. IMPRESSION: No radiographic finding to explain chest pain Electronically Signed   By: Marin Roberts M.D.   On: 12/24/2021 12:04    Impression: Alcoholic cirrhosis Anemia Coffee ground emesis Melena Hepatic mass suspicious for [facility] Alcohol abuse  HGB 8.0 Platelets 15  AST 422 ALT 303  Alkphos 45 TBili 2.1 GFR >60  INR 12/24/2021 1.6  MELD 3.0: 18 at 12/24/2021  8:31 PM MELD-Na: 19 at 12/24/2021  8:31 PM Calculated from: Serum Creatinine: 0.60 mg/dL (Using min of 1 mg/dL) at 12/24/2021  8:31 PM Serum Sodium: 131 mmol/L at 12/24/2021  8:31 PM Total Bilirubin: 2.1 mg/dL at 12/24/2021 11:43 AM Serum Albumin: 3.6 g/dL (Using max of 3.5 g/dL) at 12/24/2021 11:43 AM INR(ratio): 1.6 at 12/24/2021  11:43 AM Age at listing (hypothetical): 32 years Sex: Male at 12/24/2021  8:31 PM   Patient with history of significant alcohol abuse.  CT angio GI bleed with evidence of new diagnosis of cirrhosis.  Patient presented with melena, and coffee-ground emesis suspicious for upper GI bleed, BUN 15, this morning hemoglobin 8.0 from baseline of 13.4 2 days ago.  Patient with chronically low platelets likely due to cirrhosis has received 2 units of platelets pheresis.   CT angio also with evidence of a 4.6 cm mass in the right lobe of the liver suspicious for hepatocellular carcinoma, AFP ordered, MRI liver with and without contrast ordered for further evaluation.    Plan: Plan for EGD tomorrow. I thoroughly discussed the procedures to include nature, alternatives, benefits, and risks including but not limited to bleeding, perforation, infection, anesthesia/cardiac and pulmonary complications. Patient provides understanding and gave verbal consent to proceed. Continue Protonix 80 mg/h infusion Continue clear liquid diet NPO at midnight Continue with plan for MRI liver with and without contrast for further evaluation of HCC We will follow results of AFP Continue to monitor hemoglobin, transfuse if less than 7 Will need platelet transfusion prior to EGD. Continue thiamine, consider starting multivitamin and folic acid supplement Continue octreotide 50 mcg/h infusion Continue anti-emetics and supportive care as needed. [facility] will follow.     LOS: 1 day   Charlott Rakes  PA-C 12/25/2021, 8:51 AM  Contact #  [phone]

## 2021-12-25 NOTE — Evaluation (Addendum)
Physical Therapy Evaluation-1x Patient Details Name: [name] MRN: [id number] DOB: 08-Apr-1970 Today's Date: 12/25/2021  History of Present Illness  51 y.o. male admitted with GI bleed, hematemesis. Hx of ETOH abuse, DM II, cirrhosis, hiatal hernia  Clinical Impression  On eval, pt was close Supv assist for ambulation. He walked ~900 feet around the unit without UE support. He denied dizziness. He had intermittent mild losses of balance/stumbles towards the R side in addition to poor judgement of distance to objects in the environment at times on the R side. No overt LOB requiring external assistance however. He did report some issues with vision in his R eye for ~ 6 months-has seen a physician. Will ask mobility team to follow during this hospital stay.        Recommendations for follow up therapy are one component of a multi-disciplinary discharge planning process, led by the attending physician.  Recommendations may be updated based on patient status, additional functional criteria and insurance authorization.  Follow Up Recommendations No PT follow up      Assistance Recommended at Discharge PRN  Patient can return home with the following       Equipment Recommendations None recommended by PT  Recommendations for Other Services       Functional Status Assessment Patient has had a recent decline in their functional status and demonstrates the ability to make significant improvements in function in a reasonable and predictable amount of time.     Precautions / Restrictions Precautions Precautions: Fall Restrictions Weight Bearing Restrictions: No      Mobility  Bed Mobility Overal bed mobility: Modified Independent                  Transfers Overall transfer level: Modified independent                      Ambulation/Gait Ambulation/Gait assistance: Supervision Gait Distance (Feet): 900 Feet Assistive device: None Gait Pattern/deviations:  Step-through pattern, Decreased stride length       General Gait Details: Close Supv for open space ambulation without UE support. Intermittent unsteadiness with mild LOB and stumbles to R side every time. Some difficulty allowing for appropriate distance when passing objects in the environment on R side.  Stairs            Wheelchair Mobility    Modified Rankin (Stroke Patients Only)       Balance Overall balance assessment: Needs assistance           Standing balance-Leahy Scale: Good               High level balance activites: Side stepping, Backward walking High Level Balance Comments: no difficulty with side stepping to L and R side; no difficulty with backwards walking             Pertinent Vitals/Pain Pain Assessment Pain Assessment: No/denies pain    Home Living Family/patient expects to be discharged to:: Private residence Living Arrangements: Spouse/significant other;Children (wife works) Available Help at Discharge: Family;Available PRN/intermittently Type of Home: House         Home Layout: One level Home Equipment: None      Prior Function Prior Level of Function : Independent/Modified Independent             Mobility Comments: no device       Hand Dominance        Extremity/Trunk Assessment   Upper Extremity Assessment Upper Extremity Assessment: Defer to OT  evaluation    Lower Extremity Assessment Lower Extremity Assessment: Generalized weakness    Cervical / Trunk Assessment Cervical / Trunk Assessment: Normal  Communication   Communication: No difficulties  Cognition Arousal/Alertness: Awake/alert Behavior During Therapy: WFL for tasks assessed/performed Overall Cognitive Status: Within Functional Limits for tasks assessed                                          General Comments      Exercises     Assessment/Plan    PT Assessment Patient needs continued PT services  PT Problem  List Decreased mobility       PT Treatment Interventions Therapeutic activities;Therapeutic exercise;Patient/family education;Balance training;Functional mobility training;Gait training    PT Goals (Current goals can be found in the Care Plan section)       Frequency Min 3X/week     Co-evaluation               AM-PAC PT "6 Clicks" Mobility  Outcome Measure Help needed turning from your back to your side while in a flat bed without using bedrails?: None Help needed moving from lying on your back to sitting on the side of a flat bed without using bedrails?: None Help needed moving to and from a bed to a chair (including a wheelchair)?: None Help needed standing up from a chair using your arms (e.g., wheelchair or bedside chair)?: None Help needed to walk in hospital room?: A Little Help needed climbing 3-5 steps with a railing? : A Little 6 Click Score: 22    End of Session   Activity Tolerance: Patient tolerated treatment well Patient left: in bed;with call bell/phone within reach;with family/visitor present   PT Visit Diagnosis: Unsteadiness on feet (R26.81)    Time: 1504 - 1515     Charges: EvalAudie Clear, PT Acute Rehabilitation  Office: [phone] Pager: [phone]

## 2021-12-25 NOTE — Progress Notes (Signed)
PROGRESS NOTE  [name]  DOB: [birth year]  PCP: [name], MD EUM:[id number]  DOA: 12/24/2021  LOS: 1 day  Hospital Day: 2  Brief narrative: [name] is a 51 y.o. male with PMH significant for chronic alcoholism, DM 2, HTN, DVT, anxiety. Patient presented to the ED on 10/3 with complaint of hematemesis.  Patient reports that on 10/1, while watching a race, he drank about 10 beers.  He woke up in the night with abdominal pain, went to the bathroom and had coffee-ground emesis.  For the next 24 hours, he had 3-4 more episodes and also started having black stools and hence came to the ED on 10/2.  In the ED, hemoglobin was 9.1, platelet low at 35, LFTs elevated.  FOBT positive CT angio did not show any active GI bleed, it showed evidence of cirrhosis and 4.6 cm mass in the right lobe of the liver suspicious for HCC. Patient was started on IV octreotide, IV Rocephin Admitted to hospitalist service GI consulted See below for details  Subjective: Patient was seen and examined this morning.  Pleasant middle-aged Caucasian male.  Sad affect. Lying down in bed.  Not in distress.  Mild alcohol-related tremors present.  His mother and his son was at bedside. Chart reviewed In the last 24 hours, no fever, heart rate tachycardic to 100s, blood pressure stable, breathing on room air Last set of labs this morning with WC count 2.1, hemoglobin 7.5, platelets down to 22, potassium low at 3.4, phosphorus low at 1.8, LFTs remain elevated  Assessment and plan: Acute GI bleeding Presented with hematemesis after binge drinking in the setting of alcoholic liver cirrhosis Seen by GI.  Noted a plan for EGD tomorrow 10/5. Continue Protonix drip.  Continue octreotide infusion. Clear liquid diet for now.  N.p.o. after midnight Currently on IV Rocephin for SBP prophylaxis  Acute blood loss anemia Baseline hemoglobin more than 13.  Hemoglobin downtrending because of GI bleeding.  7.5  this morning.  Repeat in AM.  Transfuse if less than 7. Recent Labs    12/24/21 1143 12/24/21 1441 12/24/21 2031 12/25/21 0926 12/25/21 1343  HGB 9.9* 8.7* 8.0* 7.5* 7.3*  MCV 75.7* 76.7* 78.8* 79.7* 80.5  VITAMINB12  --   --  679  --   --   FOLATE  --   --  5.4*  --   --   FERRITIN  --   --  61  --   --   TIBC  --   --  293  --   --   IRON  --   --  49  --   --   RETICCTPCT  --   --  4.2*  --   --    Alcohol liver cirrhosis Chronic alcoholism At risk of withdrawal Currently on CIWA protocol with as needed IV Ativan.  Seems to be having some withdrawal symptoms this morning.  I ordered for scheduled Valium  Chronic thrombocytopenia Platelet level was low at 15 on 10/3.  2 units platelets transfused.  22 this morning. Noted to plan from GI to order 1 unit of platelet tomorrow morning prior to intervention.   Recent Labs  Lab 12/23/21 1200 12/24/21 1143 12/24/21 1441 12/24/21 2031 12/25/21 0926 12/25/21 1343  PLT 39* 35* 21* 15* 22* 21*    New liver mass CT scan obtained on admission also showed a 4.6 cm mass in the right lobe of the liver suspicious for HCC.  Hepatitis panel nonreactive.  GI ordered for MRI, AFP.  Type 2 diabetes mellitus A1c 6.9 on 12/24/2021 PTA on metformin 500 mg twice daily. Currently on sliding scale insulin with Accu-Cheks Recent Labs  Lab 12/24/21 2017 12/24/21 2320 12/25/21 0438 12/25/21 0741 12/25/21 1128  GLUCAP 211* 229* 142* 142* 229*   Goals of care   Code Status: Full Code    Mobility: Encourage ambulation  Skin assessment:     Nutritional status:  Body mass index is 24.55 kg/m.  Nutrition Problem: Increased nutrient needs Etiology: chronic illness (alcohol abuse, liver cirrhosis) Signs/Symptoms: estimated needs     Diet:  Diet Order             Diet NPO time specified  Diet effective midnight           Diet clear liquid Room service appropriate? Yes; Fluid consistency: Thin  Diet effective now                    DVT prophylaxis:  SCDs Start: 12/24/21 1946   Antimicrobials: IV Rocephin prophylaxis for SBP Fluid: None Consultants: GI Family Communication: Multiple family members at bedside  Status is: Inpatient  Continue in-hospital care because: Pending EGD tomorrow Level of care: Progressive   Dispo: The patient is from: Home              Anticipated d/c is to: Pending clinical course              Patient currently is not medically stable to d/c.   Difficult to place patient No     Infusions:   sodium chloride 20 mL/hr at 12/25/21 0619   cefTRIAXone (ROCEPHIN)  IV     octreotide (SANDOSTATIN) 500 mcg in sodium chloride 0.9 % 250 mL (2 mcg/mL) infusion 50 mcg/hr (12/25/21 1312)   pantoprazole 8 mg/hr (12/25/21 1315)    Scheduled Meds:  sodium chloride   Intravenous Once   diazepam  5 mg Oral BID   insulin aspart  0-9 Units Subcutaneous Q4H   LORazepam  0-4 mg Intravenous Q6H   Or   LORazepam  0-4 mg Oral Q6H   [START ON 12/26/2021] LORazepam  0-4 mg Intravenous Q12H   Or   [START ON 12/26/2021] LORazepam  0-4 mg Oral Q12H   [START ON 12/28/2021] pantoprazole  40 mg Intravenous Q12H   thiamine (VITAMIN B1) injection  100 mg Intravenous Daily    PRN meds: acetaminophen **OR** acetaminophen, albuterol, HYDROcodone-acetaminophen, morphine injection   Antimicrobials: Anti-infectives (From admission, onward)    Start     Dose/Rate Route Frequency Ordered Stop   12/25/21 1400  cefTRIAXone (ROCEPHIN) 2 g in sodium chloride 0.9 % 100 mL IVPB        2 g 200 mL/hr over 30 Minutes Intravenous Every 24 hours 12/24/21 1943     12/24/21 1330  cefTRIAXone (ROCEPHIN) 2 g in sodium chloride 0.9 % 100 mL IVPB        2 g 200 mL/hr over 30 Minutes Intravenous  Once 12/24/21 1328 12/24/21 1511       Objective: Vitals:   12/25/21 0500 12/25/21 1247  BP: 133/60 139/80  Pulse: 80 75  Resp: 18 16  Temp: 98.7 F (37.1 C) 98 F (36.7 C)  SpO2: 98% 98%    Intake/Output  Summary (Last 24 hours) at 12/25/2021 1631 Last data filed at 12/25/2021 0700 Gross per 24 hour  Intake 1591.25 ml  Output --  Net 1591.25 ml   Filed Weights   12/24/21  1127 12/24/21 1800  Weight: 70.3 kg 71.1 kg   Weight change:  Body mass index is 24.55 kg/m.   Physical Exam: General exam: Pleasant, middle-aged Caucasian male.  Not in physical distress Skin: No rashes, lesions or ulcers. HEENT: Atraumatic, normocephalic, no obvious bleeding Lungs: Clear to auscultation bilaterally CVS: Regular rate and rhythm, no murmur GI/Abd soft, mild diffuse tenderness, nondistended, bowel sound present CNS: Alert, awake, oriented x3 Psychiatry: Mood appropriate Extremities: No pedal edema, no calf tenderness  Data Review: I have personally reviewed the laboratory data and studies available.  F/u labs ordered Unresulted Labs (From admission, onward)     Start     Ordered   12/26/21 0600  Prepare platelet pheresis  (Blood Administration Adult)  Once,   R       Question Answer Comment  Number of Apheresis Units (1 unit of apheresis platelets will increase platelets 30,000/mL in an avg sized adult) 1 unit   Transfusion Indications Pre-op plt = 50,000   Date/Time blood product needed For transfusion   If emergent release call blood bank Not emergent release      12/25/21 1130   12/25/21 1130  Mitochondrial antibodies  (Mitochondiral/Smooth Muscle AB PNL (PNL))  Once,   R        12/25/21 1130   12/25/21 1130  Anti-smooth muscle antibody, IgG  (Mitochondiral/Smooth Muscle AB PNL (PNL))  Once,   R        12/25/21 1130   12/25/21 1130  Ceruloplasmin  Once,   R        12/25/21 1130   12/25/21 1130  Alpha-1-antitrypsin  Once,   R        12/25/21 1130   12/25/21 1129  Hepatitis B surface antigen  Once,   R        12/25/21 1130   12/25/21 1129  HCV RNA quant rflx ultra or genotyp  Once,   R        12/25/21 1130   12/25/21 0853  AFP tumor marker  Once,   R        12/25/21 0853   12/25/21  0026  T3  Add-on,   AD        12/25/21 0025   12/24/21 1946  CBC  Now then every 6 hours,   R      12/24/21 1946   12/24/21 1445  Urinalysis, Routine w reflex microscopic Urine, Clean Catch  Once,   URGENT        12/24/21 1444            Signed, Terrilee Croak, MD [facility] 12/25/2021

## 2021-12-25 NOTE — Assessment & Plan Note (Signed)
Will check t3 t 4 level

## 2021-12-25 NOTE — Evaluation (Signed)
Occupational Therapy Evaluation Patient Details Name: [name] MRN: [id number] DOB: [birth year] Today's Date: 12/25/2021   History of Present Illness [name] is a 51 y.o. male with medical history significant of [name], DM 2   Clinical Impression   [name] is a 51 year old man who presents with above medical history. On evaluation he demonstrates the ability to perform to get in and out of bed, ambulate holding IV pole and perform ADLs without physical assistance. He reports blurriness in right eye and the pupil is larger - but this has been chronic for 6 months. H reports e has had it checked out by doctors and that he has a partially detached retina. He has functional ocular movement and functional peripheral vision. From an OT standpoint patient has no needs.     Recommendations for follow up therapy are one component of a multi-disciplinary discharge planning process, led by the attending physician.  Recommendations may be updated based on patient status, additional functional criteria and insurance authorization.   Follow Up Recommendations  No OT follow up    Assistance Recommended at Discharge PRN  Patient can return home with the following Assistance with cooking/housework    Functional Status Assessment  Patient has not had a recent decline in their functional status  Equipment Recommendations  None recommended by OT    Recommendations for Other Services       Precautions / Restrictions Precautions Precautions: Fall Restrictions Weight Bearing Restrictions: No      Mobility Bed Mobility Overal bed mobility: Modified Independent                  Transfers Overall transfer level: Modified independent                 General transfer comment: No physical assist required. Patient managed IV pole.      Balance Overall balance assessment: Mild deficits observed, not formally tested                                          ADL either performed or assessed with clinical judgement   ADL Overall ADL's : Modified independent                                       General ADL Comments: Increased time to manage IV pole but no physical assistance for ADLs. Supervision for safety.     Vision   Additional Comments: blurry vision in right eye for 6 months. 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 Pupil is larger in right eye than left.     Perception     Praxis      Pertinent Vitals/Pain Pain Assessment Pain Assessment: No/denies pain     Hand Dominance     Extremity/Trunk Assessment Upper Extremity Assessment Upper Extremity Assessment: Overall WFL for tasks assessed   Lower Extremity Assessment Lower Extremity Assessment: Overall WFL for tasks assessed   Cervical / Trunk Assessment Cervical / Trunk Assessment: Normal   Communication Communication Communication: No difficulties   Cognition Arousal/Alertness: Awake/alert Behavior During Therapy: WFL for tasks assessed/performed Overall Cognitive Status: Within Functional Limits for tasks assessed  General Comments       Exercises     Shoulder Instructions      Home Living Family/patient expects to be discharged to:: Private residence Living Arrangements: Spouse/significant other;Children (wife works) Available Help at Discharge: Family;Available PRN/intermittently Type of Home: House       Home Layout: One level     Bathroom Shower/Tub: Teacher, early years/pre: Standard     Home Equipment: None          Prior Functioning/Environment Prior Level of Function : Independent/Modified Independent             Mobility Comments: no device          OT Problem List: Impaired balance (sitting and/or standing)      OT Treatment/Interventions:      OT Goals(Current goals can be found in the care plan section) Acute  Rehab OT Goals OT Goal Formulation: All assessment and education complete, DC therapy  OT Frequency:      Co-evaluation              AM-PAC OT "6 Clicks" Daily Activity     Outcome Measure Help from another person eating meals?: None Help from another person taking care of personal grooming?: None Help from another person toileting, which includes using toliet, bedpan, or urinal?: None Help from another person bathing (including washing, rinsing, drying)?: None Help from another person to put on and taking off regular upper body clothing?: None Help from another person to put on and taking off regular lower body clothing?: None 6 Click Score: 24   End of Session Nurse Communication:  (Okay to see)  Activity Tolerance: Patient tolerated treatment well Patient left: in bed;with call bell/phone within reach;with bed alarm set;with family/visitor present  OT Visit Diagnosis: Unsteadiness on feet (R26.81)                Time: 7858-8502 OT Time Calculation (min): 8 min Charges:  OT General Charges $OT Visit: 1 Visit OT Evaluation $OT Eval Low Complexity: 1 Low  Gustavo Lah, OTR/L [place]  Office [phone]   Lenward Chancellor 12/25/2021, 4:31 PM

## 2021-12-25 NOTE — Consult Note (Signed)
Referring Provider: [facility] Primary Care Physician:  [name], MD Primary Gastroenterologist:  Althia Forts  Reason for Consultation:  hematemesis  HPI: [name] is a 51 y.o. male with past medical history of alcohol abuse, anxiety, diabetes, DVT, hypertension.  Presented to the emergency room for evaluation of hematemesis 12/24/2021.   Patient reports starting on Sunday he drank about 10 beers while watching a race.  He initially felt fine went to bed at 8 PM, at 9 PM he began to have some lower abdominal pain and went to the bathroom and had coffee-ground emesis.  Notes he had 3-4 episodes of coffee-ground emesis on Sunday.  Since Sunday he has also begun to have black stools.  He has had 3-4 episodes of black stools last noticed yesterday afternoon.  Given black stools and black emesis he decided to present to the emergency room.  In the ED, hemoglobin 9.9, PLT 35, AST/ALT 422/303, T. bili 2.1, INR 1.6, PT 19.2, ethanol 123, FOBT positive.  CT angio GI bleed with evidence of cirrhosis and 4.6 cm mass in the right lobe of the liver for suspicious for HCC.  Patient was started on IV octreotide and Rocephin.  Suspected upper GI bleed, [facility] was consulted.  Patient has history of drinking about 8 beers daily since he was in high school.  Denies liquor or wine use.  He had previously quit drinking alcohol for 9-1/2 years but recently restarted about 3 months ago.  Denies NSAID use Denies blood thinner use No previous EGD or colonoscopy.   Past Medical History:  Diagnosis Date   Alcoholic ([place])    Anxiety    Diabetes ([place])    DVT (deep venous thrombosis) ([place])    Hypertension     Past Surgical History:  Procedure Laterality Date   BLOOD PATCH     TONSILLECTOMY      Prior to Admission medications   Medication Sig Start Date End Date Taking? Authorizing Provider  ALPRAZolam Duanne Moron) 0.5 MG tablet Take 0.5 mg by mouth daily as needed. 10/15/21   [provider]   chlordiazePOXIDE (LIBRIUM) 25 MG capsule '50mg'$  PO TID x 1D, then 25-'50mg'$  PO BID X 1D, then 25-'50mg'$  PO QD X 1D 09/23/21   Lucrezia Starch, MD  folic acid (FOLVITE) 1 MG tablet Take 1 tablet (1 mg total) by mouth daily. 09/23/21   Lucrezia Starch, MD  metFORMIN (GLUCOPHAGE) 500 MG tablet Take 1 tablet (500 mg total) by mouth 2 (two) times daily with a meal. 09/23/21   Dykstra, Ellwood Dense, MD  Multiple Vitamin (MULTIVITAMIN WITH MINERALS) TABS tablet Take 1 tablet by mouth daily. 12/13/20   Ival Bible, MD  traZODone (DESYREL) 100 MG tablet Take 1 tablet (100 mg total) by mouth at bedtime. 12/12/20 12/24/21  Ival Bible, MD  traZODone (DESYREL) 150 MG tablet Take 150 mg by mouth at bedtime. 12/03/21   [provider]    Scheduled Meds:  insulin aspart  0-9 Units Subcutaneous Q4H   LORazepam  0-4 mg Intravenous Q6H   Or   LORazepam  0-4 mg Oral Q6H   [START ON 12/26/2021] LORazepam  0-4 mg Intravenous Q12H   Or   [START ON 12/26/2021] LORazepam  0-4 mg Oral Q12H   [START ON 12/28/2021] pantoprazole  40 mg Intravenous Q12H   thiamine (VITAMIN B1) injection  100 mg Intravenous Daily   Continuous Infusions:  sodium chloride 20 mL/hr at 12/25/21 0619   cefTRIAXone (ROCEPHIN)  IV  octreotide (SANDOSTATIN) 500 mcg in sodium chloride 0.9 % 250 mL (2 mcg/mL) infusion 50 mcg/hr (12/24/21 2309)   pantoprazole 8 mg/hr (12/24/21 2311)   PRN Meds:.acetaminophen **OR** acetaminophen, albuterol, HYDROcodone-acetaminophen, morphine injection  Allergies as of 12/24/2021   (No Known Allergies)    Family History  Problem Relation Age of Onset   Hypertension Father     Social History   Socioeconomic History   Marital status: Married    Spouse name: Not on file   Number of children: Not on file   Years of education: Not on file   Highest education level: Not on file  Occupational History   Not on file  Tobacco Use   Smoking status: Never   Smokeless tobacco: Never   Vaping Use   Vaping Use: Never used  Substance and Sexual Activity   Alcohol use: Yes    Alcohol/week: 12.0 standard drinks of alcohol    Types: 12 Cans of beer per week    Comment: Beer/Hourly   Drug use: No    Comment: marijuana in the past   Sexual activity: Not on file  Other Topics Concern   Not on file  Social History Narrative   Pt works odd Architect jobs and delivers furniture. Lives with fiancee in [place], [place].    Social Determinants of Health   Financial Resource Strain: Not on file  Food Insecurity: No Food Insecurity (12/24/2021)   Hunger Vital Sign    Worried About Running Out of Food in the Last Year: Never true    Ran Out of Food in the Last Year: Never true  Transportation Needs: No Transportation Needs (12/24/2021)   PRAPARE - Hydrologist (Medical): No    Lack of Transportation (Non-Medical): No  Physical Activity: Not on file  Stress: Not on file  Social Connections: Not on file  Intimate Partner Violence: Not At Risk (12/24/2021)   Humiliation, Afraid, Rape, and Kick questionnaire    Fear of Current or Ex-Partner: No    Emotionally Abused: No    Physically Abused: No    Sexually Abused: No    Review of Systems: All negative except as stated above in HPI.  Physical Exam:Physical Exam Constitutional:      General: He is not in acute distress.    Appearance: He is normal weight.  HENT:     Head: Normocephalic and atraumatic.     Right Ear: External ear normal.     Left Ear: External ear normal.     Nose: Nose normal.     Mouth/Throat:     Mouth: Mucous membranes are moist.  Eyes:     General: No scleral icterus.    Pupils: Pupils are equal, round, and reactive to light.     Comments: Conjunctival pallor  Cardiovascular:     Rate and Rhythm: Normal rate and regular rhythm.     Pulses: Normal pulses.     Heart sounds: Normal heart sounds.  Pulmonary:     Effort: Pulmonary effort is normal.     Breath sounds:  Normal breath sounds.  Abdominal:     General: Abdomen is flat. Bowel sounds are normal. There is no distension.     Palpations: Abdomen is soft. There is no mass.     Tenderness: There is no abdominal tenderness. There is no guarding or rebound.     Hernia: No hernia is present.  Musculoskeletal:        General: Normal range of  motion.     Cervical back: Normal range of motion and neck supple.  Skin:    General: Skin is warm and dry.     Coloration: Skin is pale. Skin is not jaundiced.  Neurological:     General: No focal deficit present.     Mental Status: He is alert and oriented to person, place, and time. Mental status is at baseline.  Psychiatric:        Mood and Affect: Mood normal.        Behavior: Behavior normal.     Vital signs: Vitals:   12/25/21 0423 12/25/21 0500  BP: (!) 141/72 133/60  Pulse: 87 80  Resp: 19 18  Temp: 98.6 F (37 C) 98.7 F (37.1 C)  SpO2: 97% 98%   Last BM Date : 12/24/21    GI:  Lab Results: Recent Labs    12/24/21 1143 12/24/21 1441 12/24/21 2031  WBC 7.3 5.4 3.4*  HGB 9.9* 8.7* 8.0*  HCT 28.0* 24.3* 23.1*  PLT 35* 21* 15*   BMET Recent Labs    12/23/21 1200 12/24/21 1143 12/24/21 2031  NA 134* 128* 131*  K 3.6 3.7 3.8  CL 95* 93* 100  CO2 18* 20* 22  GLUCOSE 163* 205* 191*  BUN '12 15 14  '$ CREATININE 0.54* 0.65 0.60*  CALCIUM 8.8* 8.1* 7.5*   LFT Recent Labs    12/24/21 1143  PROT 6.1*  ALBUMIN 3.6  AST 422*  ALT 303*  ALKPHOS 45  BILITOT 2.1*   PT/INR Recent Labs    12/24/21 1143  LABPROT 19.2*  INR 1.6*     Studies/Results: CT ANGIO GI BLEED  Result Date: 12/24/2021 CLINICAL DATA:  Left upper quadrant pain. Possible pancreatitis. Evaluate for GI bleed. Vomiting last night black in nature. Alcohol abuse. EXAM: CTA ABDOMEN AND PELVIS WITHOUT AND WITH CONTRAST TECHNIQUE: Multidetector CT imaging of the abdomen and pelvis was performed using the standard protocol during bolus administration of  intravenous contrast. Multiplanar reconstructed images and MIPs were obtained and reviewed to evaluate the vascular anatomy. RADIATION DOSE REDUCTION: This exam was performed according to the departmental dose-optimization program which includes automated exposure control, adjustment of the mA and/or kV according to patient size and/or use of iterative reconstruction technique. CONTRAST:  159m OMNIPAQUE IOHEXOL 350 MG/ML SOLN COMPARISON:  08/06/2012 FINDINGS: VASCULAR Aorta: Normal in caliber without evidence of aneurysm or dissection. Minimal calcified plaque is present. Celiac: Patent without evidence of aneurysm, dissection, vasculitis or significant stenosis. SMA: Patent without evidence of aneurysm, dissection, vasculitis or significant stenosis. Renals: Both renal arteries are patent without evidence of aneurysm, dissection, vasculitis, fibromuscular dysplasia or significant stenosis. There is an accessory right renal artery which is patent. Mild calcified plaque over the proximal more inferior right renal artery. IMA: Patent without evidence of aneurysm, dissection, vasculitis or significant stenosis. Inflow: Patent without evidence of aneurysm, dissection, vasculitis or significant stenosis. Proximal Outflow: Bilateral common femoral and visualized portions of the superficial and profunda femoral arteries are patent without evidence of aneurysm, dissection, vasculitis or significant stenosis. Veins: No obvious venous abnormality within the limitations of this arterial phase study. Review of the MIP images confirms the above findings. NON-VASCULAR Lower chest: Lung bases are clear. Moderate size hiatal hernia is present. Hepatobiliary: There is a 4.6 cm well-defined oval mass over the right lobe of the liver with hypodense center. Small hypodense liver with nodular contour compatible with cirrhosis. Gallbladder and biliary tree are normal. Pancreas: Normal. Spleen: Mild splenomegaly. Adrenals/Urinary  Tract:  Adrenal glands are normal. Kidneys are normal in size without hydronephrosis or nephrolithiasis. Bladder and ureters are normal. Stomach/Bowel: Moderate size hiatal hernia. Small bowel is otherwise unremarkable. Appendix is normal. Colon is unremarkable. Small amount of retained oral contrast over the terminal ileum and cecum. Lymphatic: No significant adenopathy. Reproductive: Normal. Other: No free fluid or focal inflammatory change. Musculoskeletal: No focal abnormality. IMPRESSION: VASCULAR: 1.  No evidence of acute GI bleed. 2.  Mild aortic atherosclerosis. NONVASCULAR: 1.  No acute findings in the abdomen/pelvis. 2. Evidence of patient's known cirrhosis with associated splenomegaly. There is a 4.6 cm mass over the right lobe of the liver suspicious for hepatocellular carcinoma. Recommend MRI for further evaluation. 3.  Moderate size hiatal hernia. Electronically Signed   By: Marin Olp M.D.   On: 12/24/2021 12:58   DG Chest Portable 1 View  Result Date: 12/24/2021 CLINICAL DATA:  Chest pain EXAM: PORTABLE CHEST 1 VIEW COMPARISON:  Chest radiograph 08/10/2012 FINDINGS: The heart size and mediastinal contours are within normal limits. Both lungs are clear. The visualized skeletal structures are unremarkable. IMPRESSION: No radiographic finding to explain chest pain Electronically Signed   By: Marin Roberts M.D.   On: 12/24/2021 12:04    Impression: Alcoholic cirrhosis Anemia Coffee ground emesis Melena Hepatic mass suspicious for [facility] Alcohol abuse  HGB 8.0 Platelets 15  AST 422 ALT 303  Alkphos 45 TBili 2.1 GFR >60  INR 12/24/2021 1.6  MELD 3.0: 18 at 12/24/2021  8:31 PM MELD-Na: 19 at 12/24/2021  8:31 PM Calculated from: Serum Creatinine: 0.60 mg/dL (Using min of 1 mg/dL) at 12/24/2021  8:31 PM Serum Sodium: 131 mmol/L at 12/24/2021  8:31 PM Total Bilirubin: 2.1 mg/dL at 12/24/2021 11:43 AM Serum Albumin: 3.6 g/dL (Using max of 3.5 g/dL) at 12/24/2021 11:43 AM INR(ratio): 1.6 at 12/24/2021  11:43 AM Age at listing (hypothetical): 11 years Sex: Male at 12/24/2021  8:31 PM   Patient with history of significant alcohol abuse.  CT angio GI bleed with evidence of new diagnosis of cirrhosis.  Patient presented with melena, and coffee-ground emesis suspicious for upper GI bleed, BUN 15, this morning hemoglobin 8.0 from baseline of 13.4 2 days ago.  Patient with chronically low platelets likely due to cirrhosis has received 2 units of platelets pheresis.   CT angio also with evidence of a 4.6 cm mass in the right lobe of the liver suspicious for hepatocellular carcinoma, AFP ordered, MRI liver with and without contrast ordered for further evaluation.    Plan: Plan for EGD tomorrow. I thoroughly discussed the procedures to include nature, alternatives, benefits, and risks including but not limited to bleeding, perforation, infection, anesthesia/cardiac and pulmonary complications. Patient provides understanding and gave verbal consent to proceed. Continue Protonix 80 mg/h infusion Continue clear liquid diet NPO at midnight Continue with plan for MRI liver with and without contrast for further evaluation of HCC We will follow results of AFP Continue to monitor hemoglobin, transfuse if less than 7 Will need platelet transfusion prior to EGD. Continue thiamine, consider starting multivitamin and folic acid supplement Continue octreotide 50 mcg/h infusion Continue anti-emetics and supportive care as needed. [facility] will follow.     LOS: 1 day   Charlott Rakes  PA-C 12/25/2021, 8:51 AM  Contact #  [phone]

## 2021-12-26 ENCOUNTER — Inpatient Hospital Stay (HOSPITAL_COMMUNITY): Payer: Self-pay | Admitting: Anesthesiology

## 2021-12-26 ENCOUNTER — Encounter (HOSPITAL_COMMUNITY)
Admission: EM | Disposition: A | Discharge status: Home / Self Care | Payer: Self-pay | Source: Home / Self Care | Attending: Internal Medicine

## 2021-12-26 ENCOUNTER — Encounter (HOSPITAL_COMMUNITY): Payer: Self-pay | Admitting: Internal Medicine

## 2021-12-26 DIAGNOSIS — I8510 Secondary esophageal varices without bleeding: Secondary | ICD-10-CM

## 2021-12-26 DIAGNOSIS — E119 Type 2 diabetes mellitus without complications: Secondary | ICD-10-CM

## 2021-12-26 DIAGNOSIS — K766 Portal hypertension: Secondary | ICD-10-CM

## 2021-12-26 DIAGNOSIS — K7460 Unspecified cirrhosis of liver: Secondary | ICD-10-CM

## 2021-12-26 DIAGNOSIS — Z7984 Long term (current) use of oral hypoglycemic drugs: Secondary | ICD-10-CM

## 2021-12-26 DIAGNOSIS — I10 Essential (primary) hypertension: Secondary | ICD-10-CM

## 2021-12-26 DIAGNOSIS — K3189 Other diseases of stomach and duodenum: Secondary | ICD-10-CM

## 2021-12-26 HISTORY — PX: ESOPHAGOGASTRODUODENOSCOPY: SHX5428

## 2021-12-26 LAB — PREPARE PLATELET PHERESIS
Unit division: 0
Unit division: 0

## 2021-12-26 LAB — COMPREHENSIVE METABOLIC PANEL
ALT: 213 U/L — ABNORMAL HIGH (ref 0–44)
AST: 187 U/L — ABNORMAL HIGH (ref 15–41)
Albumin: 3.1 g/dL — ABNORMAL LOW (ref 3.5–5.0)
Alkaline Phosphatase: 36 U/L — ABNORMAL LOW (ref 38–126)
Anion gap: 10 (ref 5–15)
BUN: 9 mg/dL (ref 6–20)
CO2: 26 mmol/L (ref 22–32)
Calcium: 7.7 mg/dL — ABNORMAL LOW (ref 8.9–10.3)
Chloride: 100 mmol/L (ref 98–111)
Creatinine, Ser: 0.68 mg/dL (ref 0.61–1.24)
GFR, Estimated: >60 mL/min (ref >60)
Glucose, Bld: 164 mg/dL — ABNORMAL HIGH (ref 70–99)
Potassium: 3.3 mmol/L — ABNORMAL LOW (ref 3.5–5.1)
Sodium: 136 mmol/L (ref 135–145)
Total Bilirubin: 1.6 mg/dL — ABNORMAL HIGH (ref 0.3–1.2)
Total Protein: 5.2 g/dL — ABNORMAL LOW (ref 6.5–8.1)

## 2021-12-26 LAB — CBC
HCT: 23.3 % — ABNORMAL LOW (ref 39.0–52.0)
Hemoglobin: 7.8 g/dL — ABNORMAL LOW (ref 13.0–17.0)
MCH: 27.3 pg (ref 26.0–34.0)
MCHC: 33.5 g/dL (ref 30.0–36.0)
MCV: 81.5 fL (ref 80.0–100.0)
Platelets: 23 10*3/uL — CL (ref 150–400)
RBC: 2.86 MIL/uL — ABNORMAL LOW (ref 4.22–5.81)
RDW: 18.2 % — ABNORMAL HIGH (ref 11.5–15.5)
WBC: 2 10*3/uL — ABNORMAL LOW (ref 4.0–10.5)
nRBC: 0 % (ref 0.0–0.2)

## 2021-12-26 LAB — HCV RNA QUANT RFLX ULTRA OR GENOTYP
HCV RNA Qnt(log copy/mL): UNDETERMINED log10 IU/mL
HepC Qn: NOT DETECTED IU/mL

## 2021-12-26 LAB — BPAM PLATELET PHERESIS
Blood Product Expiration Date: 202310052359
Blood Product Expiration Date: 202310052359
ISSUE DATE / TIME: 202310032245
ISSUE DATE / TIME: 202310040109
Unit Type and Rh: 600
Unit Type and Rh: 600

## 2021-12-26 LAB — GLUCOSE, CAPILLARY
Glucose-Capillary: 119 mg/dL — ABNORMAL HIGH (ref 70–99)
Glucose-Capillary: 158 mg/dL — ABNORMAL HIGH (ref 70–99)
Glucose-Capillary: 194 mg/dL — ABNORMAL HIGH (ref 70–99)
Glucose-Capillary: 196 mg/dL — ABNORMAL HIGH (ref 70–99)
Glucose-Capillary: 331 mg/dL — ABNORMAL HIGH (ref 70–99)
Glucose-Capillary: 350 mg/dL — ABNORMAL HIGH (ref 70–99)

## 2021-12-26 LAB — MAGNESIUM: Magnesium: 1.8 mg/dL (ref 1.7–2.4)

## 2021-12-26 LAB — PHOSPHORUS: Phosphorus: 2.8 mg/dL (ref 2.5–4.6)

## 2021-12-26 SURGERY — EGD (ESOPHAGOGASTRODUODENOSCOPY)
Anesthesia: General

## 2021-12-26 MED ORDER — PROPOFOL 10 MG/ML IV BOLUS
INTRAVENOUS | Status: AC
  Filled 2021-12-26: qty 20

## 2021-12-26 MED ORDER — PANTOPRAZOLE SODIUM 40 MG PO TBEC
40.0000 mg | DELAYED_RELEASE_TABLET | Freq: Two times a day (BID) | ORAL | Status: DC
  Administered 2021-12-27 – 2021-12-28 (×3): 40 mg via ORAL
  Filled 2021-12-26 (×3): qty 1

## 2021-12-26 MED ORDER — ONDANSETRON HCL 4 MG/2ML IJ SOLN
INTRAMUSCULAR | Status: DC | PRN
  Administered 2021-12-26: 4 mg via INTRAVENOUS

## 2021-12-26 MED ORDER — B COMPLEX-C PO TABS
1.0000 | ORAL_TABLET | Freq: Every day | ORAL | Status: DC
  Administered 2021-12-26 – 2021-12-27 (×2): 1 via ORAL
  Filled 2021-12-26 (×2): qty 1

## 2021-12-26 MED ORDER — SUCCINYLCHOLINE CHLORIDE 200 MG/10ML IV SOSY
PREFILLED_SYRINGE | INTRAVENOUS | Status: DC | PRN
  Administered 2021-12-26: 120 mg via INTRAVENOUS

## 2021-12-26 MED ORDER — PROPOFOL 10 MG/ML IV BOLUS
INTRAVENOUS | Status: DC | PRN
  Administered 2021-12-26: 170 mg via INTRAVENOUS

## 2021-12-26 MED ORDER — FENTANYL CITRATE (PF) 100 MCG/2ML IJ SOLN
INTRAMUSCULAR | Status: AC
  Filled 2021-12-26: qty 2

## 2021-12-26 MED ORDER — ESMOLOL HCL 100 MG/10ML IV SOLN
INTRAVENOUS | Status: DC | PRN
  Administered 2021-12-26: 30 mg via INTRAVENOUS

## 2021-12-26 MED ORDER — ADULT MULTIVITAMIN W/MINERALS CH
1.0000 | ORAL_TABLET | Freq: Every day | ORAL | Status: DC
  Administered 2021-12-27 – 2021-12-28 (×2): 1 via ORAL
  Filled 2021-12-26 (×2): qty 1

## 2021-12-26 MED ORDER — DEXAMETHASONE SODIUM PHOSPHATE 10 MG/ML IJ SOLN
INTRAMUSCULAR | Status: DC | PRN
  Administered 2021-12-26: 5 mg via INTRAVENOUS

## 2021-12-26 MED ORDER — ENSURE MAX PROTEIN PO LIQD
11.0000 [oz_av] | Freq: Two times a day (BID) | ORAL | Status: DC
  Administered 2021-12-26 – 2021-12-28 (×2): 11 [oz_av] via ORAL
  Filled 2021-12-26 (×5): qty 330

## 2021-12-26 MED ORDER — PNEUMOCOCCAL 20-VAL CONJ VACC 0.5 ML IM SUSY: 0.5000 mL | PREFILLED_SYRINGE | INTRAMUSCULAR | Status: AC | PRN

## 2021-12-26 MED ORDER — PROPOFOL 500 MG/50ML IV EMUL
INTRAVENOUS | Status: AC
  Filled 2021-12-26: qty 50

## 2021-12-26 MED ORDER — LABETALOL HCL 5 MG/ML IV SOLN
INTRAVENOUS | Status: DC | PRN
  Administered 2021-12-26 (×2): 5 mg via INTRAVENOUS

## 2021-12-26 MED ORDER — ONDANSETRON HCL 4 MG/2ML IJ SOLN
4.0000 mg | Freq: Four times a day (QID) | INTRAMUSCULAR | Status: AC
  Administered 2021-12-26 (×2): 4 mg via INTRAVENOUS
  Filled 2021-12-26 (×2): qty 2

## 2021-12-26 MED ORDER — LACTATED RINGERS IV SOLN: INTRAVENOUS | Status: DC

## 2021-12-26 MED ORDER — LIDOCAINE 2% (20 MG/ML) 5 ML SYRINGE
INTRAMUSCULAR | Status: DC | PRN
  Administered 2021-12-26: 100 mg via INTRAVENOUS

## 2021-12-26 MED ORDER — FENTANYL CITRATE (PF) 100 MCG/2ML IJ SOLN
INTRAMUSCULAR | Status: DC | PRN
  Administered 2021-12-26: 50 ug via INTRAVENOUS

## 2021-12-26 NOTE — Progress Notes (Signed)
PROGRESS NOTE  [name]  DOB: 10/10/1970  PCP: [name], MD TTS:[id number]  DOA: 12/24/2021  LOS: 2 days  Hospital Day: 3  Brief narrative: [name] is a 51 y.o. male with PMH significant for chronic alcoholism, DM 2, HTN, DVT, anxiety. Patient presented to the ED on 10/3 with complaint of hematemesis.  Patient reports that on 10/1, while watching a race, he drank about 10 beers.  He woke up in the night with abdominal pain, went to the bathroom and had coffee-ground emesis.  For the next 24 hours, he had 3-4 more episodes and also started having black stools and hence came to the ED on 10/2.  In the ED, hemoglobin was 9.1, platelet low at 35, LFTs elevated.  FOBT positive CT angio did not show any active GI bleed, it showed evidence of cirrhosis and 4.6 cm mass in the right lobe of the liver suspicious for HCC. Patient was started on IV octreotide, IV Rocephin Admitted to hospitalist service GI consulted See below for details  Subjective: Patient was seen and examined this afternoon.  Middle-aged Caucasian male.  Lying down in bed.  Not in distress.  Family was not at bedside.  We discussed about the MRI finding.  Assessment and plan: Acute GI bleeding Presented with hematemesis after binge drinking in the setting of alcoholic liver cirrhosis Started on Protonix and octreotide drip.  Seen by GI.  Underwent EGD today 10/5.  Per EGD report, patient had grade 1 and small <5 mm esophageal varices, portal hypertensive gastropathy. Protonix drip and octreotide drip have been stopped.  Patient has been started on Protonix IV twice daily may need to continue it indefinitely.  Acute blood loss anemia Baseline hemoglobin more than 13.  Hemoglobin down trended to the lowest of 7.3.  Of GI bleeding.  No PRBC transfusion given so far. Recent Labs    12/24/21 2031 12/25/21 0926 12/25/21 1343 12/25/21 1957 12/26/21 0923  HGB 8.0* 7.5* 7.3* 7.5* 7.8*  MCV 78.8* 79.7*  80.5 81.2 81.5  VITAMINB12 679  --   --   --   --   FOLATE 5.4*  --   --   --   --   FERRITIN 61  --   --   --   --   TIBC 293  --   --   --   --   IRON 49  --   --   --   --   RETICCTPCT 4.2*  --   --   --   --    Alcohol liver cirrhosis Chronic alcoholism At risk of withdrawal Currently on CIWA protocol with scheduled Valium and as needed IV Ativan.    Chronic thrombocytopenia Platelet level was low at 15 on 10/3.  2 units platelets transfused.  22 this morning. Noted to plan from GI to order 1 unit of platelet tomorrow morning prior to intervention.   Recent Labs  Lab 12/23/21 1200 12/24/21 1143 12/24/21 1441 12/24/21 2031 12/25/21 0926 12/25/21 1343 12/25/21 1957 12/26/21 0923  PLT 39* 35* 21* 15* 22* 21* 21* 23*    New liver mass CT scan obtained on admission also showed a 4.6 cm mass in the right lobe of the liver suspicious for HCC.  Hepatitis panel nonreactive. 10/4, MRI abdomen showed 4.6 cm hemorrhagic mass difficult to characterize by LI-RADS due to lack non rim enhancement, but is still concerning for [facility] until proven otherwise. Consider tissue sampling and/or follow-up MR in 3  months.  Type 2 diabetes mellitus A1c 6.9 on 12/24/2021 PTA on metformin 500 mg twice daily. Currently on sliding scale insulin with Accu-Cheks Recent Labs  Lab 12/25/21 1936 12/25/21 2341 12/26/21 0420 12/26/21 0735 12/26/21 1309  GLUCAP 152* 124* 119* 158* 194*   Goals of care   Code Status: Full Code    Mobility: Encourage ambulation  Skin assessment:     Nutritional status:  Body mass index is 24.55 kg/m.  Nutrition Problem: Increased nutrient needs Etiology: chronic illness (alcohol abuse, liver cirrhosis) Signs/Symptoms: estimated needs     Diet:  Diet Order             Diet regular Room service appropriate? Yes; Fluid consistency: Thin  Diet effective now                   DVT prophylaxis:  SCDs Start: 12/24/21 1946   Antimicrobials: IV Rocephin  prophylaxis for SBP Fluid: None Consultants: GI Family Communication: Multiple family members at bedside  Status is: Inpatient  Continue in-hospital care because: Pending work-up for possible [place] Level of care: Progressive   Dispo: The patient is from: Home              Anticipated d/c is to: Pending clinical course              Patient currently is not medically stable to d/c.   Difficult to place patient No     Infusions:   cefTRIAXone (ROCEPHIN)  IV 2 g (12/26/21 1412)    Scheduled Meds:  B-complex with vitamin C  1 tablet Oral Daily   diazepam  5 mg Oral BID   insulin aspart  0-9 Units Subcutaneous Q4H   LORazepam  0-4 mg Intravenous Q12H   Or   LORazepam  0-4 mg Oral Q12H   [START ON 12/27/2021] multivitamin with minerals  1 tablet Oral Daily   pantoprazole  40 mg Oral BID AC   Ensure Max Protein  11 oz Oral BID   thiamine (VITAMIN B1) injection  100 mg Intravenous Daily    PRN meds: acetaminophen **OR** acetaminophen, albuterol, HYDROcodone-acetaminophen, morphine injection   Antimicrobials: Anti-infectives (From admission, onward)    Start     Dose/Rate Route Frequency Ordered Stop   12/25/21 1400  cefTRIAXone (ROCEPHIN) 2 g in sodium chloride 0.9 % 100 mL IVPB        2 g 200 mL/hr over 30 Minutes Intravenous Every 24 hours 12/24/21 1943     12/24/21 1330  cefTRIAXone (ROCEPHIN) 2 g in sodium chloride 0.9 % 100 mL IVPB        2 g 200 mL/hr over 30 Minutes Intravenous  Once 12/24/21 1328 12/24/21 1511       Objective: Vitals:   12/26/21 1250 12/26/21 1315  BP: 106/82 (!) 87/59  Pulse: 89 76  Resp: 20 18  Temp:  97.7 F (36.5 C)  SpO2: 94% 99%    Intake/Output Summary (Last 24 hours) at 12/26/2021 1546 Last data filed at 12/26/2021 1150 Gross per 24 hour  Intake 1989.15 ml  Output --  Net 1989.15 ml   Filed Weights   12/24/21 1127 12/24/21 1800 12/26/21 1008  Weight: 70.3 kg 71.1 kg 71.1 kg   Weight change:  Body mass index is 24.55 kg/m.    Physical Exam: General exam: Pleasant, middle-aged Caucasian male.  Not in physical distress Skin: No rashes, lesions or ulcers. HEENT: Atraumatic, normocephalic, no obvious bleeding Lungs: Clear to auscultation bilaterally CVS: Regular  rate and rhythm, no murmur GI/Abd soft, mild diffuse tenderness, nondistended, bowel sound present CNS: Alert, awake, oriented x3 Psychiatry: Sad affect Extremities: No pedal edema, no calf tenderness  Data Review: I have personally reviewed the laboratory data and studies available.  F/u labs ordered Unresulted Labs (From admission, onward)     Start     Ordered   12/27/21 0962  Basic metabolic panel  Tomorrow morning,   R        12/26/21 0858   12/27/21 0500  CBC with Differential/Platelet  Tomorrow morning,   R        12/26/21 0858   12/25/21 1130  Mitochondrial antibodies  (Mitochondiral/Smooth Muscle AB PNL (PNL))  Once,   R        12/25/21 1130   12/25/21 1130  Anti-smooth muscle antibody, IgG  (Mitochondiral/Smooth Muscle AB PNL (PNL))  Once,   R        12/25/21 1130   12/25/21 1130  Ceruloplasmin  Once,   R        12/25/21 1130   12/25/21 1130  Alpha-1-antitrypsin  Once,   R        12/25/21 1130   12/25/21 1129  HCV RNA quant rflx ultra or genotyp  Once,   R        12/25/21 1130   12/25/21 0853  AFP tumor marker  Once,   R        12/25/21 0853   12/25/21 0026  T3  Add-on,   AD        12/25/21 0025   12/24/21 1445  Urinalysis, Routine w reflex microscopic Urine, Clean Catch  Once,   URGENT        12/24/21 1444            Signed, Terrilee Croak, MD [facility] 12/26/2021

## 2021-12-26 NOTE — Interval H&P Note (Signed)
History and Physical Interval Note: 51/male with cirrhosis,possible HCC,severe thrombocytopenia with coffee ground emesis and melena for EGD with possible banding.  12/26/2021 11:23 AM  [name]  has presented today for EGD with possible banding, with the diagnosis of cirrhosis, possible variceal bleed.  The various methods of treatment have been discussed with the patient and family. After consideration of risks, benefits and other options for treatment, the patient has consented to  Procedure(s): ESOPHAGOGASTRODUODENOSCOPY (EGD) (N/A) as a surgical intervention.  The patient's history has been reviewed, patient examined, no change in status, stable for surgery.  I have reviewed the patient's chart and labs.  Questions were answered to the patient's satisfaction.     Ronnette Juniper

## 2021-12-26 NOTE — Anesthesia Preprocedure Evaluation (Signed)
Anesthesia Evaluation  Patient identified by MRN, date of birth, ID band Patient awake    Reviewed: Allergy & Precautions, NPO status , Patient's Chart, lab work & pertinent test results  Airway Mallampati: II       Dental   Pulmonary neg pulmonary ROS,    breath sounds clear to auscultation       Cardiovascular hypertension,  Rhythm:Regular     Neuro/Psych  Neuromuscular disease    GI/Hepatic hiatal hernia, (+) Hepatitis -  Endo/Other  diabetes  Renal/GU negative Renal ROS     Musculoskeletal   Abdominal   Peds  Hematology   Anesthesia Other Findings   Reproductive/Obstetrics                             Anesthesia Physical Anesthesia Plan  ASA: 3  Anesthesia Plan: General   Post-op Pain Management:    Induction: Intravenous  PONV Risk Score and Plan: 2 and Ondansetron, Dexamethasone and Treatment may vary due to age or medical condition  Airway Management Planned: Nasal Cannula and Simple Face Mask  Additional Equipment:   Intra-op Plan:   Post-operative Plan:   Informed Consent: I have reviewed the patients History and Physical, chart, labs and discussed the procedure including the risks, benefits and alternatives for the proposed anesthesia with the patient or authorized representative who has indicated his/her understanding and acceptance.     Dental advisory given  Plan Discussed with: CRNA and Anesthesiologist  Anesthesia Plan Comments:         Anesthesia Quick Evaluation

## 2021-12-26 NOTE — Op Note (Signed)
[facility] Patient Name: [name] Procedure Date: 12/26/2021 MRN: [id number] Attending MD: Ronnette Juniper , MD Date of Birth: 1971/03/04 CSN: [id number] Age: 51 Admit Type: Inpatient Procedure:                Upper GI endoscopy Indications:              Coffee-ground emesis, Melena, Cirrhosis rule out                            esophageal varices, Abnormal CT of the GI tract,                            Abnormal MRI of the GI tract, suspicious for newly                            diagnosed Hepatocelluar carcinoma Providers:                Ronnette Juniper, MD, Benay Pillow, RN, Darliss Cheney,                            Technician Referring MD:             [facility] Medicines:                Monitored Anesthesia Care Complications:            No immediate complications. Estimated Blood Loss:     Estimated blood loss: none. Procedure:                Pre-Anesthesia Assessment:                           - Prior to the procedure, a History and Physical                            was performed, and patient medications and                            allergies were reviewed. The patient's tolerance of                            previous anesthesia was also reviewed. The risks                            and benefits of the procedure and the sedation                            options and risks were discussed with the patient.                            All questions were answered, and informed consent                            was obtained. Prior Anticoagulants: The patient has  taken no previous anticoagulant or antiplatelet                            agents. ASA Grade Assessment: III - A patient with                            severe systemic disease. After reviewing the risks                            and benefits, the patient was deemed in                            satisfactory condition to undergo the procedure.                            After obtaining informed consent, the endoscope was                            passed under direct vision. Throughout the                            procedure, the patient's blood pressure, pulse, and                            oxygen saturations were monitored continuously. The                            [id number]-H190 ([id number]) Olympus endoscope was introduced                            through the mouth, and advanced to the second part                            of duodenum. The upper GI endoscopy was                            accomplished without difficulty. The patient                            tolerated the procedure well. Scope In: Scope Out: Findings:      The upper third of the esophagus and middle third of the esophagus were       normal.      Grade I, small (< 5 mm) varices were found in the lower third of the       esophagus. They were diminutive in size.      The Z-line was found 38 cm from the incisors.      Severe, diffuse portal hypertensive gastropathy was found in the entire       examined stomach.      The cardia and gastric fundus were otherwise normal on retroflexion.       There was no evidence of gastric varices.      The examined duodenum was normal. Impression:               - Normal upper third of  esophagus and middle third                            of esophagus.                           - Grade I and small (< 5 mm) esophageal varices.                           - Z-line, 38 cm from the incisors.                           - Portal hypertensive gastropathy.                           - Normal examined duodenum.                           - No specimens collected. Moderate Sedation:      Patient did not receive moderate sedation for this procedure, but       instead received monitored anesthesia care. Recommendation:           - Resume regular diet.                           - Will D/C octreotide and protonix drip.                           - Will start PPI BID  and recommend for it to be                            continued indefinitely.                           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 Procedure Code(s):        --- Professional ---                           [phone], Esophagogastroduodenoscopy, flexible,                            transoral; diagnostic, including collection of                            specimen(s) by brushing or washing, when performed                            (separate procedure) Diagnosis Code(s):        --- Professional ---                           K74.60, Unspecified cirrhosis of liver  I85.10, Secondary esophageal varices without                            bleeding                           K76.6, Portal hypertension                           K31.89, Other diseases of stomach and duodenum                           K92.0, Hematemesis                           K92.1, Melena (includes Hematochezia)                           R93.3, Abnormal findings on diagnostic imaging of                            other parts of digestive tract CPT copyright 2019 [employer]. All rights reserved. The codes documented in this report are preliminary and upon coder review may  be revised to meet current compliance requirements. Ronnette Juniper, MD 12/26/2021 11:56:11 AM This report has been signed electronically. Number of Addenda: 0

## 2021-12-26 NOTE — Transfer of Care (Signed)
Immediate Anesthesia Transfer of Care Note  Patient: [name]  Procedure(s) Performed: ESOPHAGOGASTRODUODENOSCOPY (EGD)  Patient Location: PACU  Anesthesia Type:General  Level of Consciousness: sedated  Airway & Oxygen Therapy: Patient Spontanous Breathing and Patient connected to face mask oxygen  Post-op Assessment: Report given to RN and Post -op Vital signs reviewed and stable  Post vital signs: Reviewed and stable  Last Vitals:  Vitals Value Taken Time  BP 116/84 12/26/21 1224  Temp 36.4 C 12/26/21 1220  Pulse 101 12/26/21 1228  Resp 17 12/26/21 1228  SpO2 97 % 12/26/21 1228  Vitals shown include unvalidated device data.  Last Pain:  Vitals:   12/26/21 1220  TempSrc: Tympanic  PainSc:       Patients Stated Pain Goal: 3 (28/31/51 7616)  Complications: No notable events documented.

## 2021-12-26 NOTE — Anesthesia Procedure Notes (Signed)
Procedure Name: Intubation Date/Time: 12/26/2021 11:38 AM  Performed by: Lind Covert, CRNAPre-anesthesia Checklist: Patient identified, Emergency Drugs available, Suction available and Patient being monitored Patient Re-evaluated:Patient Re-evaluated prior to induction Oxygen Delivery Method: Circle system utilized Preoxygenation: Pre-oxygenation with 100% oxygen Induction Type: IV induction, Rapid sequence and Cricoid Pressure applied Laryngoscope Size: Mac and 3 Grade View: Grade I Tube type: Oral Tube size: 7.5 mm Airway Equipment and Method: Stylet Placement Confirmation: ETT inserted through vocal cords under direct vision, positive ETCO2 and breath sounds checked- equal and bilateral Secured at: 22 cm Tube secured with: Tape Dental Injury: Teeth and Oropharynx as per pre-operative assessment

## 2021-12-26 NOTE — Anesthesia Postprocedure Evaluation (Signed)
Anesthesia Post Note  Patient: [name]  Procedure(s) Performed: ESOPHAGOGASTRODUODENOSCOPY (EGD)     Patient location during evaluation: Endoscopy Anesthesia Type: General Level of consciousness: awake Pain management: pain level controlled Vital Signs Assessment: post-procedure vital signs reviewed and stable Respiratory status: spontaneous breathing Cardiovascular status: stable Postop Assessment: no apparent nausea or vomiting Anesthetic complications: no   No notable events documented.  Last Vitals:  Vitals:   12/26/21 1250 12/26/21 1315  BP: 106/82 (!) 87/59  Pulse: 89 76  Resp: 20 18  Temp:  36.5 C  SpO2: 94% 99%    Last Pain:  Vitals:   12/26/21 1315  TempSrc: Oral  PainSc:                  Karessa Onorato

## 2021-12-26 NOTE — TOC Initial Note (Signed)
Transition of Care [facility]) - Initial/Assessment Note    Patient Details  Name: [name] MRN: [id number] Date of Birth: 11/22/1970  [facility] CM/SW Contact:    Roseanne Kaufman, RN Phone Number: 12/26/2021, 1:31 PM  Clinical Narrative:    [facility] [place] consult for SA resources. Spoke with patient at bedside who reports he is agreeable to receive SA resources at discharge. Attached SA resources to AVS.  TOC will continue to follow for needs                 Expected Discharge Plan: Home/Self Care Barriers to Discharge: Continued Medical Work up   Patient Goals and CMS Choice Patient states their goals for this hospitalization and ongoing recovery are:: return home   Choice offered to / list presented to : NA  Expected Discharge Plan and Services Expected Discharge Plan: Home/Self Care In-house Referral: NA Discharge Planning Services: CM Consult Post Acute Care Choice: NA                   DME Arranged: N/A DME Agency: NA       HH Arranged: NA HH Agency: NA        Prior Living Arrangements/Services   Lives with:: Self, Spouse Patient language and need for interpreter reviewed:: Yes Do you feel safe going back to the place where you live?: Yes      Need for Family Participation in Patient Care: No (Comment) Care giver support system in place?: Yes (comment) Current home services: Other (comment) (n/a) Criminal Activity/Legal Involvement Pertinent to Current Situation/Hospitalization: No - Comment as needed  Activities of Daily Living Home Assistive Devices/Equipment: None ADL Screening (condition at time of admission) Patient's cognitive ability adequate to safely complete daily activities?: Yes Is the patient deaf or have difficulty hearing?: No Does the patient have difficulty seeing, even when wearing glasses/contacts?: No Does the patient have difficulty concentrating, remembering, or making decisions?: No Patient able to express need  for assistance with ADLs?: Yes Does the patient have difficulty dressing or bathing?: No Independently performs ADLs?: Yes (appropriate for developmental age) Does the patient have difficulty walking or climbing stairs?: No Weakness of Legs: Both Weakness of Arms/Hands: None  Permission Sought/Granted Permission sought to share information with : Case Manager Permission granted to share information with : Yes, Verbal Permission Granted  Share Information with NAME: Case Manager           Emotional Assessment Appearance:: Appears stated age Attitude/Demeanor/Rapport: Gracious Affect (typically observed): Accepting Orientation: : Oriented to Place, Oriented to Self, Oriented to  Time, Oriented to Situation Alcohol / Substance Use: Alcohol Use Psych Involvement: No (comment)  Admission diagnosis:  GIB (gastrointestinal bleeding) [K92.2] Gastrointestinal hemorrhage, unspecified gastrointestinal hemorrhage type [K92.2] Patient Active Problem List   Diagnosis Date Noted   Low TSH level 12/25/2021   GIB (gastrointestinal bleeding) 12/24/2021   DM (diabetes mellitus), type 2 ([place]) 12/24/2021   Alcohol withdrawal ([place]) 12/12/2020   Substance induced mood disorder ([place]) 12/09/2020   Hypokalemia 08/15/2012   Hypomagnesemia 08/15/2012   Other and unspecified hyperlipidemia 08/15/2012   Encephalopathy acute 08/09/2012   Alcohol withdrawal with delirium ([place]) 08/08/2012   Portal hypertension ([place]) 08/08/2012   Splenomegaly 41/74/0814   Alcoholic hepatitis without ascites 08/08/2012   Hiatal hernia 08/08/2012   Alcohol abuse 08/06/2012   Elevated blood alcohol level 08/06/2012   Acute pancreatitis 08/06/2012   Cirrhosis of liver ([place]) 08/06/2012   Thrombocytopenia ([place]) 08/06/2012   Hypertension  Anxiety    DVT (deep venous thrombosis) ([place])    PCP:  [name], MD Pharmacy:   [facility] [place] [place] [address] Phone: [phone] Fax: [phone]     Social Determinants of Health (SDOH) Interventions Housing Interventions: Intervention Not Indicated  Readmission Risk Interventions     No data to display

## 2021-12-27 ENCOUNTER — Inpatient Hospital Stay (HOSPITAL_COMMUNITY): Payer: Self-pay

## 2021-12-27 DIAGNOSIS — R160 Hepatomegaly, not elsewhere classified: Secondary | ICD-10-CM

## 2021-12-27 LAB — CBC WITH DIFFERENTIAL/PLATELET
Abs Immature Granulocytes: 0.04 10*3/uL (ref 0.00–0.07)
Basophils Absolute: 0 10*3/uL (ref 0.0–0.1)
Basophils Relative: 0 %
Eosinophils Absolute: 0 10*3/uL (ref 0.0–0.5)
Eosinophils Relative: 0 %
HCT: 24.7 % — ABNORMAL LOW (ref 39.0–52.0)
Hemoglobin: 8.4 g/dL — ABNORMAL LOW (ref 13.0–17.0)
Immature Granulocytes: 1 %
Lymphocytes Relative: 9 %
Lymphs Abs: 0.4 10*3/uL — ABNORMAL LOW (ref 0.7–4.0)
MCH: 27.5 pg (ref 26.0–34.0)
MCHC: 34 g/dL (ref 30.0–36.0)
MCV: 81 fL (ref 80.0–100.0)
Monocytes Absolute: 0.4 10*3/uL (ref 0.1–1.0)
Monocytes Relative: 10 %
Neutro Abs: 3.5 10*3/uL (ref 1.7–7.7)
Neutrophils Relative %: 80 %
Platelets: 41 10*3/uL — ABNORMAL LOW (ref 150–400)
RBC: 3.05 MIL/uL — ABNORMAL LOW (ref 4.22–5.81)
RDW: 19 % — ABNORMAL HIGH (ref 11.5–15.5)
WBC: 4.3 10*3/uL (ref 4.0–10.5)
nRBC: 0 % (ref 0.0–0.2)

## 2021-12-27 LAB — PREPARE PLATELET PHERESIS: Unit division: 0

## 2021-12-27 LAB — BASIC METABOLIC PANEL
Anion gap: 8 (ref 5–15)
BUN: 11 mg/dL (ref 6–20)
CO2: 28 mmol/L (ref 22–32)
Calcium: 8.5 mg/dL — ABNORMAL LOW (ref 8.9–10.3)
Chloride: 101 mmol/L (ref 98–111)
Creatinine, Ser: 0.54 mg/dL — ABNORMAL LOW (ref 0.61–1.24)
GFR, Estimated: >60 mL/min (ref >60)
Glucose, Bld: 186 mg/dL — ABNORMAL HIGH (ref 70–99)
Potassium: 3.7 mmol/L (ref 3.5–5.1)
Sodium: 137 mmol/L (ref 135–145)

## 2021-12-27 LAB — CERULOPLASMIN: Ceruloplasmin: 15.1 mg/dL — ABNORMAL LOW (ref 16.0–31.0)

## 2021-12-27 LAB — GLUCOSE, CAPILLARY
Glucose-Capillary: 177 mg/dL — ABNORMAL HIGH (ref 70–99)
Glucose-Capillary: 144 mg/dL — ABNORMAL HIGH (ref 70–99)
Glucose-Capillary: 165 mg/dL — ABNORMAL HIGH (ref 70–99)
Glucose-Capillary: 219 mg/dL — ABNORMAL HIGH (ref 70–99)
Glucose-Capillary: 154 mg/dL — ABNORMAL HIGH (ref 70–99)

## 2021-12-27 LAB — ANTI-SMOOTH MUSCLE ANTIBODY, IGG: F-Actin IgG: 7 Units (ref 0–19)

## 2021-12-27 LAB — PROTIME-INR
INR: 1.5 — ABNORMAL HIGH (ref 0.8–1.2)
Prothrombin Time: 18.3 seconds — ABNORMAL HIGH (ref 11.4–15.2)

## 2021-12-27 LAB — BPAM PLATELET PHERESIS
Blood Product Expiration Date: 202310072359
ISSUE DATE / TIME: 202310050539
Unit Type and Rh: 6200

## 2021-12-27 LAB — T3: T3, Total: 51 ng/dL — ABNORMAL LOW (ref 71–180)

## 2021-12-27 LAB — ALPHA-1-ANTITRYPSIN: A-1 Antitrypsin, Ser: 118 mg/dL (ref 101–187)

## 2021-12-27 LAB — MITOCHONDRIAL ANTIBODIES: Mitochondrial M2 Ab, IgG: <20 Units (ref 0.0–20.0)

## 2021-12-27 LAB — AFP TUMOR MARKER: AFP, Serum, Tumor Marker: 2.6 ng/mL (ref 0.0–8.4)

## 2021-12-27 MED ORDER — FOLIC ACID 5 MG/ML IJ SOLN
1.0000 mg | Freq: Every day | INTRAMUSCULAR | Status: DC
  Administered 2021-12-27: 1 mg via INTRAVENOUS
  Filled 2021-12-27 (×2): qty 0.2

## 2021-12-27 MED ORDER — FENTANYL CITRATE (PF) 100 MCG/2ML IJ SOLN
INTRAMUSCULAR | Status: AC
  Filled 2021-12-27: qty 4

## 2021-12-27 MED ORDER — SODIUM CHLORIDE 0.9% IV SOLUTION: Freq: Once | INTRAVENOUS | Status: AC

## 2021-12-27 MED ORDER — MIDAZOLAM HCL 2 MG/2ML IJ SOLN
INTRAMUSCULAR | Status: AC
  Filled 2021-12-27: qty 4

## 2021-12-27 MED ORDER — LIDOCAINE HCL 1 % IJ SOLN
INTRAMUSCULAR | Status: AC
  Administered 2021-12-27: 10 mL via SUBCUTANEOUS
  Filled 2021-12-27: qty 20

## 2021-12-27 MED ORDER — FENTANYL CITRATE (PF) 100 MCG/2ML IJ SOLN
INTRAMUSCULAR | Status: AC | PRN
  Administered 2021-12-27 (×2): 50 ug via INTRAVENOUS

## 2021-12-27 MED ORDER — MIDAZOLAM HCL 2 MG/2ML IJ SOLN
INTRAMUSCULAR | Status: AC | PRN
  Administered 2021-12-27 (×2): 1 mg via INTRAVENOUS

## 2021-12-27 NOTE — Procedures (Signed)
  Procedure:  [place] core liver lesion biopsy R lobe Preprocedure diagnosis: The primary encounter diagnosis was Gastrointestinal hemorrhage, unspecified gastrointestinal hemorrhage type. A diagnosis of GIB (gastrointestinal bleeding) was also pertinent to this visit.  Postprocedure diagnosis: same EBL:    minimal Complications:   none immediate  See full dictation in [employer].  Dillard Cannon MD Main # [phone] Pager  [phone] Mobile [phone]

## 2021-12-27 NOTE — Consult Note (Signed)
[place]  [phone]   HEMATOLOGY ONCOLOGY INPATIENT CONSULTATION   Ethin Drummond  DOB: 1971-03-03  MR#: [id number]  CSN#: [id number]    Requesting Physician: [facility]  Patient Care Team: [name], MD as PCP - General (Internal Medicine)  Reason for consult: liver mass   History of present illness:   51 year old Caucasian male, with past medical history of diabetes alcohol drinking, presented with upper GI bleeding from liver cirrhosis.  We will call with to evaluate his newly discovered liver mass on CT scan.  He has not seen doctors for many years, with no known history of liver disease.  He presented with coffee-ground emesis the day before his admission on January 20, 2022.  He underwent a CT angio for GI bleeding, which revealed liver cirrhosis and splenomegaly, and a 4.6 cm liver mass suspicious for liver cancer.  He subsequently underwent liver MRI, which again demonstrated a 4.6 cm hemorrhagic mass in the segment 8, difficult to characterize due to lack not rim-enhancing but overall concerning for liver cancer.  Patient just had liver biopsy before I saw him today.  Tolerated procedure well.  He has GI bleeding has stopped, EGD showed severe portal hypertensive gastropathy, diminutive esophageal varices.    MEDICAL HISTORY:  Past Medical History:  Diagnosis Date   Alcoholic ([place])    Anxiety    Diabetes ([place])    DVT (deep venous thrombosis) ([place])    Hypertension     SURGICAL HISTORY: Past Surgical History:  Procedure Laterality Date   BLOOD PATCH     TONSILLECTOMY      SOCIAL HISTORY: Social History   Socioeconomic History   Marital status: Married    Spouse name: Not on file   Number of children: Not on file   Years of education: Not on file   Highest education level: Not on file  Occupational History   Not on file  Tobacco Use   Smoking status: Never   Smokeless tobacco: Never  Vaping Use   Vaping Use:  Never used  Substance and Sexual Activity   Alcohol use: Yes    Alcohol/week: 12.0 standard drinks of alcohol    Types: 12 Cans of beer per week    Comment: Beer/Hourly   Drug use: No    Comment: marijuana in the past   Sexual activity: Not on file  Other Topics Concern   Not on file  Social History Narrative   Pt works odd Architect jobs and delivers furniture. Lives with fiancee in [place], [place].    Social Determinants of Health   Financial Resource Strain: Not on file  Food Insecurity: No Food Insecurity (12/24/2021)   Hunger Vital Sign    Worried About Running Out of Food in the Last Year: Never true    Ran Out of Food in the Last Year: Never true  Transportation Needs: No Transportation Needs (12/24/2021)   PRAPARE - Hydrologist (Medical): No    Lack of Transportation (Non-Medical): No  Physical Activity: Not on file  Stress: Not on file  Social Connections: Not on file  Intimate Partner Violence: Not At Risk (12/24/2021)   Humiliation, Afraid, Rape, and Kick questionnaire    Fear of Current or Ex-Partner: No    Emotionally Abused: No    Physically Abused: No    Sexually Abused: No    FAMILY HISTORY: Family History  Problem Relation Age of Onset   Hypertension Father  ALLERGIES:  has No Known Allergies.  MEDICATIONS:  Current Facility-Administered Medications  Medication Dose Route Frequency Provider Last Rate Last Admin   acetaminophen (TYLENOL) tablet 650 mg  650 mg Oral Q6H PRN Ronnette Juniper, MD   650 mg at 12/26/21 2004   Or   acetaminophen (TYLENOL) suppository 650 mg  650 mg Rectal Q6H PRN Ronnette Juniper, MD       albuterol (PROVENTIL) (2.5 MG/3ML) 0.083% nebulizer solution 2.5 mg  2.5 mg Nebulization Q2H PRN Ronnette Juniper, MD       B-complex with vitamin C tablet 1 tablet  1 tablet Oral Daily Dahal, Binaya, MD   1 tablet at 12/26/21 2003   cefTRIAXone (ROCEPHIN) 2 g in sodium chloride 0.9 % 100 mL IVPB  2 g Intravenous Q24H  Ronnette Juniper, MD   Stopped at 83/38/25 0539   folic acid injection 1 mg  1 mg Intravenous Daily Scheck, [name], PA-C   1 mg at 12/27/21 1742   HYDROcodone-acetaminophen (NORCO/VICODIN) 5-325 MG per tablet 1-2 tablet  1-2 tablet Oral Q4H PRN Ronnette Juniper, MD   2 tablet at 12/26/21 2157   insulin aspart (novoLOG) injection 0-9 Units  0-9 Units Subcutaneous Q4H Ronnette Juniper, MD   2 Units at 12/27/21 1741   LORazepam (ATIVAN) injection 0-4 mg  0-4 mg Intravenous Q12H Ronnette Juniper, MD       Or   LORazepam (ATIVAN) tablet 0-4 mg  0-4 mg Oral Q12H Ronnette Juniper, MD   1 mg at 12/27/21 0847   morphine (PF) 2 MG/ML injection 1 mg  1 mg Intravenous Q4H PRN Ronnette Juniper, MD   1 mg at 12/26/21 1518   multivitamin with minerals tablet 1 tablet  1 tablet Oral Daily Dahal, Marlowe Aschoff, MD   1 tablet at 12/27/21 0847   pantoprazole (PROTONIX) EC tablet 40 mg  40 mg Oral BID AC Ronnette Juniper, MD   40 mg at 12/27/21 1741   pneumococcal 20-valent conjugate vaccine (PREVNAR 20) injection 0.5 mL  0.5 mL Intramuscular Prior to discharge Dahal, Marlowe Aschoff, MD       protein supplement (ENSURE MAX) liquid  11 oz Oral BID Terrilee Croak, MD   11 oz at 12/26/21 1955   thiamine (VITAMIN B1) injection 100 mg  100 mg Intravenous Daily Ronnette Juniper, MD   100 mg at 12/27/21 0847    REVIEW OF SYSTEMS:   Constitutional: Denies fevers, chills or abnormal night sweats Eyes: Denies blurriness of vision, double vision or watery eyes Ears, nose, mouth, throat, and face: Denies mucositis or sore throat Respiratory: Denies cough, dyspnea or wheezes Cardiovascular: Denies palpitation, chest discomfort or lower extremity swelling Gastrointestinal:  Denies nausea, heartburn or change in bowel habits Skin: Denies abnormal skin rashes Lymphatics: Denies new lymphadenopathy or easy bruising Neurological:Denies numbness, tingling or new weaknesses Behavioral/Psych: Mood is stable, no new changes  All other systems were reviewed with the patient and are  negative.  PHYSICAL EXAMINATION: ECOG PERFORMANCE STATUS: 1 - Symptomatic but completely ambulatory  Vitals:   12/27/21 1305 12/27/21 1520  BP: 127/81 115/70  Pulse: 77 77  Resp: 12 18  Temp:  98.1 F (36.7 C)  SpO2: 100%    Filed Weights   12/24/21 1127 12/24/21 1800 12/26/21 1008  Weight: 155 lb (70.3 kg) 156 lb 12 oz (71.1 kg) 156 lb 12 oz (71.1 kg)    GENERAL:alert, no distress and comfortable SKIN: skin color, texture, turgor are normal, no rashes or significant lesions EYES: normal, conjunctiva are  pink and non-injected, sclera clear OROPHARYNX:no exudate, no erythema and lips, buccal mucosa, and tongue normal  NECK: supple, thyroid normal size, non-tender, without nodularity LYMPH:  no palpable lymphadenopathy in the cervical, axillary or inguinal LUNGS: clear to auscultation and percussion with normal breathing effort HEART: regular rate & rhythm and no murmurs and no lower extremity edema ABDOMEN:abdomen soft, non-tender and normal bowel sounds Musculoskeletal:no cyanosis of digits and no clubbing  PSYCH: alert & oriented x 3 with fluent speech NEURO: no focal motor/sensory deficits  LABORATORY DATA:  I have reviewed the data as listed Lab Results  Component Value Date   WBC 4.3 12/27/2021   HGB 8.4 (L) 12/27/2021   HCT 24.7 (L) 12/27/2021   MCV 81.0 12/27/2021   PLT 41 (L) 12/27/2021   Recent Labs    12/24/21 1143 12/24/21 2031 12/25/21 0926 12/26/21 0923 12/27/21 0400  NA 128*   < > 135 136 137  K 3.7   < > 3.4* 3.3* 3.7  CL 93*   < > 104 100 101  CO2 20*   < > '25 26 28  '$ GLUCOSE 205*   < > 157* 164* 186*  BUN 15   < > '14 9 11  '$ CREATININE 0.65   < > 0.58* 0.68 0.54*  CALCIUM 8.1*   < > 7.7* 7.7* 8.5*  GFRNONAA >60   < > >60 >60 >60  PROT 6.1*  --  5.2* 5.2*  --   ALBUMIN 3.6  --  3.1* 3.1*  --   AST 422*  --  227* 187*  --   ALT 303*  --  221* 213*  --   ALKPHOS 45  --  37* 36*  --   BILITOT 2.1*  --  1.4* 1.6*  --    < > = values in this  interval not displayed.    RADIOGRAPHIC STUDIES: I have personally reviewed the radiological images as listed and agreed with the findings in the report. US BIOPSY (LIVER)  Result Date: 12/27/2021 CLINICAL DATA:  Cirrhosis, right lobe liver lesion indeterminate on MRI EXAM: ULTRASOUND-GUIDED CORE LIVER BIOPSY TECHNIQUE: An ultrasound guided liver biopsy was thoroughly discussed with the patient and questions were answered. The benefits, risks, alternatives, and complications were also discussed. The patient understands and wishes to proceed with the procedure. A verbal as well as written consent was obtained. Survey ultrasound of the liver was performed, right lobe lesion localized, and an appropriate skin entry site was determined. Skin site was marked, prepped with chlorhexidine, and draped in usual sterile fashion, and infiltrated locally with 1% lidocaine. Intravenous Fentanyl 182mg and Versed '2mg'$  were administered as conscious sedation during continuous monitoring of the patient's level of consciousness and physiological / cardiorespiratory status by the radiology RN, with a total moderate sedation time of 11 minutes. A 17 gauge trocar needle was advanced under ultrasound guidance into the liver to the margin of the lesion. Multiple coaxial 18gauge core samples were then obtained through the guide needle. The guide needle was removed. Post procedure scans demonstrate no apparent complication. COMPLICATIONS: COMPLICATIONS None immediate FINDINGS: Hypoechoic right lobe lesion was localized corresponding to MR findings. Representative core biopsy samples obtained as above. IMPRESSION: Technically successful ultrasound guided core biopsy of right lobe liver lesion. Electronically Signed   By: DLucrezia EuropeM.D.   On: 12/27/2021 14:23   MR LIVER W WO CONTRAST  Result Date: 12/25/2021 CLINICAL DATA:  Cirrhosis, liver lesion on CT, evaluate for HCC EXAM: MRI ABDOMEN WITHOUT  AND WITH CONTRAST TECHNIQUE:  Multiplanar multisequence MR imaging of the abdomen was performed both before and after the administration of intravenous contrast. CONTRAST:  7 mL Vueway IV COMPARISON:  CTA abdomen/pelvis dated 12/24/2021. CT abdomen/pelvis dated 08/06/2012. FINDINGS: Lower chest: Lung bases are clear. Hepatobiliary: Nodular hepatic contour, reflecting cirrhosis. Severe hepatic steatosis. 4.6 x 4.0 cm mass in segment 8 (series 5/image 10), demonstrating intrinsic T1 hyperintensity, suggesting hemorrhage. On postcontrast subtraction imaging, there is absolutely no enhancement within the lesion, although this may be obscured by hemorrhage. Progressive rim enhancement was present on CT and is likely also present on MR. Lesion is new from 2014. Technically speaking, the lack of non rim enhancement makes this difficult to characterize via LI-RADS. However, the presence of a large lesion in a cirrhotic patient, new from 2014, with internal hemorrhage are all findings there are concerning for [facility] until proven otherwise. Gallbladder is unremarkable. No intrahepatic or extrahepatic ductal dilatation. Pancreas:  Within normal limits. Spleen: Enlarged, measuring 15.2 cm in maximal craniocaudal dimension. Adrenals/Urinary Tract:  Adrenal glands are within normal limits. Kidneys are within normal limits.  No hydronephrosis. Stomach/Bowel: Stomach is notable for a moderate hiatal hernia. Visualized bowel is grossly unremarkable. Vascular/Lymphatic: No evidence of abdominal aortic aneurysm. Portal vein is patent. No suspicious abdominal lymphadenopathy. Other:  No abdominal ascites. Musculoskeletal: No focal osseous lesions. IMPRESSION: 4.6 cm hemorrhagic mass in segment 8, difficult to characterize by LI-RADS due to lack non rim enhancement, but nonetheless concerning for [facility] until proven otherwise. Consider tissue sampling and/or follow-up MR in 3 months. Cirrhosis.  Splenomegaly.  Portal vein is patent. Severe hepatic steatosis.  Electronically Signed   By: Julian Hy M.D.   On: 12/25/2021 19:42   CT ANGIO GI BLEED  Result Date: 12/24/2021 CLINICAL DATA:  Left upper quadrant pain. Possible pancreatitis. Evaluate for GI bleed. Vomiting last night black in nature. Alcohol abuse. EXAM: CTA ABDOMEN AND PELVIS WITHOUT AND WITH CONTRAST TECHNIQUE: Multidetector CT imaging of the abdomen and pelvis was performed using the standard protocol during bolus administration of intravenous contrast. Multiplanar reconstructed images and MIPs were obtained and reviewed to evaluate the vascular anatomy. RADIATION DOSE REDUCTION: This exam was performed according to the departmental dose-optimization program which includes automated exposure control, adjustment of the mA and/or kV according to patient size and/or use of iterative reconstruction technique. CONTRAST:  176m OMNIPAQUE IOHEXOL 350 MG/ML SOLN COMPARISON:  08/06/2012 FINDINGS: VASCULAR Aorta: Normal in caliber without evidence of aneurysm or dissection. Minimal calcified plaque is present. Celiac: Patent without evidence of aneurysm, dissection, vasculitis or significant stenosis. SMA: Patent without evidence of aneurysm, dissection, vasculitis or significant stenosis. Renals: Both renal arteries are patent without evidence of aneurysm, dissection, vasculitis, fibromuscular dysplasia or significant stenosis. There is an accessory right renal artery which is patent. Mild calcified plaque over the proximal more inferior right renal artery. IMA: Patent without evidence of aneurysm, dissection, vasculitis or significant stenosis. Inflow: Patent without evidence of aneurysm, dissection, vasculitis or significant stenosis. Proximal Outflow: Bilateral common femoral and visualized portions of the superficial and profunda femoral arteries are patent without evidence of aneurysm, dissection, vasculitis or significant stenosis. Veins: No obvious venous abnormality within the limitations of this  arterial phase study. Review of the MIP images confirms the above findings. NON-VASCULAR Lower chest: Lung bases are clear. Moderate size hiatal hernia is present. Hepatobiliary: There is a 4.6 cm well-defined oval mass over the right lobe of the liver with hypodense center. Small hypodense liver with nodular contour  compatible with cirrhosis. Gallbladder and biliary tree are normal. Pancreas: Normal. Spleen: Mild splenomegaly. Adrenals/Urinary Tract: Adrenal glands are normal. Kidneys are normal in size without hydronephrosis or nephrolithiasis. Bladder and ureters are normal. Stomach/Bowel: Moderate size hiatal hernia. Small bowel is otherwise unremarkable. Appendix is normal. Colon is unremarkable. Small amount of retained oral contrast over the terminal ileum and cecum. Lymphatic: No significant adenopathy. Reproductive: Normal. Other: No free fluid or focal inflammatory change. Musculoskeletal: No focal abnormality. IMPRESSION: VASCULAR: 1.  No evidence of acute GI bleed. 2.  Mild aortic atherosclerosis. NONVASCULAR: 1.  No acute findings in the abdomen/pelvis. 2. Evidence of patient's known cirrhosis with associated splenomegaly. There is a 4.6 cm mass over the right lobe of the liver suspicious for hepatocellular carcinoma. Recommend MRI for further evaluation. 3.  Moderate size hiatal hernia. Electronically Signed   By: Marin Olp M.D.   On: 12/24/2021 12:58   DG Chest Portable 1 View  Result Date: 12/24/2021 CLINICAL DATA:  Chest pain EXAM: PORTABLE CHEST 1 VIEW COMPARISON:  Chest radiograph 08/10/2012 FINDINGS: The heart size and mediastinal contours are within normal limits. Both lungs are clear. The visualized skeletal structures are unremarkable. IMPRESSION: No radiographic finding to explain chest pain Electronically Signed   By: Marin Roberts M.D.   On: 12/24/2021 12:04    ASSESSMENT & PLAN: 51 year old gentleman with history of alcohol abuse  4.6 hemorrhagic liver mass, concerning for  liver cancer. Liver cirrhosis, Child-Pugh score 6, class A Pancytopenia, secondary to splenomegaly and alcohol Type 2 DM     Recommendations: -I personally reviewed his labs, and images and discussed findings with patient. -AFP normal  -Given the normal AFP and atypical radiographic characteristic of his liver mass, I agree with liver biopsy -I plan to call him next week to discuss biopsy results and neck steps. -If he does not have liver cancer, he is probably a candidate for liver transplant, if he is able to quit alcohol completely. He may need bridging liver targeted therapy while he is waiting for transplant.  He may be a poor candidate for surgical resection, due to his significant portal hypertension and cytopenias.  I will discuss his case in our GI conference. -I have again strongly encouraged him to quit alcohol completely.   All questions were answered. The patient knows to call the clinic with any problems, questions or concerns.      Truitt Merle, MD 12/27/2021 5:52 PM

## 2021-12-27 NOTE — Progress Notes (Signed)
[name] Progress Note  [name] 51 y.o. Apr 26, 1970   Subjective: Seen and examined laying in bed.  Denies abdominal pain, denies bowel movement since EGD yesterday.  No new GI complaints.  Planned for liver biopsy this afternoon.  Objective: Vital signs in last 24 hours: Vitals:   12/27/21 1300 12/27/21 1305  BP: 129/80 127/81  Pulse: 71 77  Resp: 16 12  Temp:    SpO2: 100% 100%    Physical Exam:  General:  Alert, cooperative, no distress, appears stated age  Head:  Normocephalic, without obvious abnormality, atraumatic  Eyes:  Anicteric sclera, EOM's intact  Lungs:   Clear to auscultation bilaterally, respirations unlabored  Heart:  Regular rate and rhythm, S1, S2 normal  Abdomen:   Soft, non-tender, bowel sounds active all four quadrants,  no masses,   Extremities: Extremities normal, atraumatic, no  edema  Pulses: 2+ and symmetric    Lab Results: Recent Labs    12/25/21 0926 12/26/21 0923 12/27/21 0400  NA 135 136 137  K 3.4* 3.3* 3.7  CL 104 100 101  CO2 '25 26 28  '$ GLUCOSE 157* 164* 186*  BUN '14 9 11  '$ CREATININE 0.58* 0.68 0.54*  CALCIUM 7.7* 7.7* 8.5*  MG 2.1 1.8  --   PHOS 1.8* 2.8  --    Recent Labs    12/25/21 0926 12/26/21 0923  AST 227* 187*  ALT 221* 213*  ALKPHOS 37* 36*  BILITOT 1.4* 1.6*  PROT 5.2* 5.2*  ALBUMIN 3.1* 3.1*   Recent Labs    12/26/21 0923 12/27/21 0400  WBC 2.0* 4.3  NEUTROABS  --  3.5  HGB 7.8* 8.4*  HCT 23.3* 24.7*  MCV 81.5 81.0  PLT 23* 41*   Recent Labs    12/27/21 0400  LABPROT 18.3*  INR 1.5*      Assessment Alcoholic cirrhosis Anemia Coffee ground emesis Melena Hepatic mass suspicious for [facility] Alcohol abuse   HGB 8.4(8.0) Platelets 41(15) AST 187(422) ALT 213(303)  Alkphos 36(45) TBili 1.6(2.1) GFR >60  INR 12/27/2021 1.5(1.6)  MELD 3.0: 13 at 12/27/2021  4:00 AM MELD-Na: 13 at 12/27/2021  4:00 AM Calculated from: Serum Creatinine: 0.54 mg/dL (Using min of 1 mg/dL)  at 12/27/2021  4:00 AM Serum Sodium: 137 mmol/L at 12/27/2021  4:00 AM Total Bilirubin: 1.6 mg/dL at 12/26/2021  9:23 AM Serum Albumin: 3.1 g/dL at 12/26/2021  9:23 AM INR(ratio): 1.5 at 12/27/2021  4:00 AM Age at listing (hypothetical): 89 years Sex: Male at 12/27/2021  4:00 AM   Patient with history of significant alcohol abuse.  CT angio GI bleed with evidence of new diagnosis of cirrhosis.  Patient presented with melena, and coffee-ground emesis suspicious for upper GI bleed.   Patient with chronically low platelets likely due to cirrhosis.    EGD 12/26/2021 Small grade 1 esophageal varices, portal hypertensive gastropathy, normal duodenum  CT angio also with evidence of a 4.6 cm mass in the right lobe of the liver suspicious for hepatocellular carcinoma, AFP 2.6,   MRI liver with and without contrast 10/5 showed a 4.6 cm hemorrhagic mass in segment 8  Plan for Liver biopsy 12/27/2021  Plan: Continue pantoprazole 40 mg twice daily Await results of liver biopsy Continue supportive care  [facility] will follow from a distance.  Paul Nigh Paul Willison PA-C 12/27/2021, 1:36 PM  Contact #  [phone]

## 2021-12-27 NOTE — Progress Notes (Addendum)
PROGRESS NOTE  [name]  DOB: [birth year]  PCP: [name], MD BPZ:[id number]  DOA: 12/24/2021  LOS: 3 days  Hospital Day: 4  Brief narrative: [name] is a 51 y.o. male with PMH significant for chronic alcoholism, DM 2, HTN, DVT, anxiety. Patient presented to the ED on 10/3 with complaint of hematemesis.  Patient reports that on 10/1, while watching a race, he drank about 10 beers.  He woke up in the night with abdominal pain, went to the bathroom and had coffee-ground emesis.  For the next 24 hours, he had 3-4 more episodes and also started having black stools and hence came to the ED on 10/2.  In the ED, hemoglobin was 9.1, platelet low at 35, LFTs elevated.  FOBT positive CT angio did not show any active GI bleed, it showed evidence of cirrhosis and 4.6 cm mass in the right lobe of the liver suspicious for HCC. Patient was started on IV octreotide, IV Rocephin Admitted to hospitalist service GI consulted See below for details  Subjective: Patient was seen and examined this morning.  Lying down in bed.  Not in withdrawal symptoms.  Pending liver biopsy by IR today.  Assessment and plan: Acute GI bleeding Presented with hematemesis after binge drinking in the setting of alcoholic liver cirrhosis Started on Protonix and octreotide drip.  Seen by GI.  Underwent EGD today 10/5.  Per EGD report, patient had grade 1 and small <5 mm esophageal varices, portal hypertensive gastropathy. Protonix drip and octreotide drip was stopped.  Patient has been started on Protonix IV twice daily may need to continue it indefinitely.  Acute blood loss anemia Baseline hemoglobin more than 13.  Hemoglobin down trended to the lowest of 7.3 because of GI bleeding.  No PRBC transfusion given so far.  Hemoglobin improved to 8.4 today. Recent Labs    12/24/21 2031 12/25/21 0926 12/25/21 1343 12/25/21 1957 12/26/21 0923 12/27/21 0400  HGB 8.0* 7.5* 7.3* 7.5* 7.8* 8.4*  MCV 78.8*  79.7* 80.5 81.2 81.5 81.0  VITAMINB12 679  --   --   --   --   --   FOLATE 5.4*  --   --   --   --   --   FERRITIN 61  --   --   --   --   --   TIBC 293  --   --   --   --   --   IRON 49  --   --   --   --   --   RETICCTPCT 4.2*  --   --   --   --   --    Alcohol liver cirrhosis Chronic alcoholism At risk of withdrawal Currently on CIWA protocol with scheduled Valium and as needed IV Ativan. No withdrawal symptoms this.  I would stop scheduled Valium at this time.  Chronic thrombocytopenia Platelet level was low at 15 on 10/3.  So far received 3 units of platelet transfusion.  Last 1 given this morning in preparation of liver biopsy by IR today. Recent Labs  Lab 12/23/21 1200 12/24/21 1143 12/24/21 1441 12/24/21 2031 12/25/21 0926 12/25/21 1343 12/25/21 1957 12/26/21 0923 12/27/21 0400  PLT 39* 35* 21* 15* 22* 21* 21* 23* 41*    New liver mass CT scan obtained on admission also showed a 4.6 cm mass in the right lobe of the liver suspicious for HCC.  Hepatitis panel nonreactive. 10/4, MRI abdomen showed 4.6 cm hemorrhagic mass  difficult to characterize by LI-RADS due to lack non rim enhancement, but is still concerning for [facility] until proven otherwise.  Discussed with IR.  Pending biopsy today.  Type 2 diabetes mellitus A1c 6.9 on 12/24/2021 PTA on metformin 500 mg twice daily. Currently on sliding scale insulin with Accu-Cheks Recent Labs  Lab 12/26/21 2009 12/26/21 2333 12/27/21 0407 12/27/21 0806 12/27/21 1123  GLUCAP 350* 331* 177* 144* 165*   Goals of care   Code Status: Full Code    Mobility: Encourage ambulation  Skin assessment:     Nutritional status:  Body mass index is 24.55 kg/m.  Nutrition Problem: Increased nutrient needs Etiology: chronic illness (alcohol abuse, liver cirrhosis) Signs/Symptoms: estimated needs     Diet:  Diet Order             Diet NPO time specified Except for: Sips with Meds  Diet effective midnight                    DVT prophylaxis:  SCDs Start: 12/24/21 1946   Antimicrobials: IV Rocephin prophylaxis for SBP Fluid: None Consultants: GI Family Communication: Multiple family members at bedside  Status is: Inpatient  Continue in-hospital care because: Pending liver biopsy by IR today Level of care: Progressive   Dispo: The patient is from: Home              Anticipated d/c is to: Pending clinical course.  Monitor postbiopsy for 24 hours for the risk of bleeding.  Hopefully home tomorrow.              Patient currently is not medically stable to d/c.   Difficult to place patient No     Infusions:   cefTRIAXone (ROCEPHIN)  IV Stopped (12/26/21 1451)    Scheduled Meds:  sodium chloride   Intravenous Once   B-complex with vitamin C  1 tablet Oral Daily   insulin aspart  0-9 Units Subcutaneous Q4H   LORazepam  0-4 mg Intravenous Q12H   Or   LORazepam  0-4 mg Oral Q12H   multivitamin with minerals  1 tablet Oral Daily   pantoprazole  40 mg Oral BID AC   Ensure Max Protein  11 oz Oral BID   thiamine (VITAMIN B1) injection  100 mg Intravenous Daily    PRN meds: acetaminophen **OR** acetaminophen, albuterol, HYDROcodone-acetaminophen, morphine injection, pneumococcal 20-valent conjugate vaccine   Antimicrobials: Anti-infectives (From admission, onward)    Start     Dose/Rate Route Frequency Ordered Stop   12/25/21 1400  cefTRIAXone (ROCEPHIN) 2 g in sodium chloride 0.9 % 100 mL IVPB        2 g 200 mL/hr over 30 Minutes Intravenous Every 24 hours 12/24/21 1943     12/24/21 1330  cefTRIAXone (ROCEPHIN) 2 g in sodium chloride 0.9 % 100 mL IVPB        2 g 200 mL/hr over 30 Minutes Intravenous  Once 12/24/21 1328 12/24/21 1511       Objective: Vitals:   12/26/21 2012 12/27/21 0409  BP: 126/83 (!) 135/101  Pulse: 96 (!) 105  Resp: 18 20  Temp: 98.2 F (36.8 C) 98.2 F (36.8 C)  SpO2: 99% 99%    Intake/Output Summary (Last 24 hours) at 12/27/2021 1125 Last data filed at  12/26/2021 2004 Gross per 24 hour  Intake 1174 ml  Output --  Net 1174 ml   Filed Weights   12/24/21 1127 12/24/21 1800 12/26/21 1008  Weight: 70.3 kg 71.1 kg  71.1 kg   Weight change:  Body mass index is 24.55 kg/m.   Physical Exam: General exam: Pleasant, middle-aged Caucasian male.  Not in physical distress Skin: No rashes, lesions or ulcers. HEENT: Atraumatic, normocephalic, no obvious bleeding Lungs: Clear to auscultation bilaterally CVS: Regular rate and rhythm, no murmur GI/Abd soft, mild diffuse tenderness, nondistended, bowel sound present CNS: Alert, awake, oriented x3.  No withdrawal symptoms. Psychiatry: Mood appropriate Extremities: No pedal edema, no calf tenderness  Data Review: I have personally reviewed the laboratory data and studies available.  F/u labs ordered Unresulted Labs (From admission, onward)     Start     Ordered   12/25/21 1130  Mitochondrial antibodies  (Mitochondiral/Smooth Muscle AB PNL (PNL))  Once,   R        12/25/21 1130   12/25/21 1130  Anti-smooth muscle antibody, IgG  (Mitochondiral/Smooth Muscle AB PNL (PNL))  Once,   R        12/25/21 1130            Signed, Terrilee Croak, MD [facility] 12/27/2021

## 2021-12-27 NOTE — Consult Note (Signed)
Chief Complaint: Patient was seen in consultation today for image guided right liver mass biopsy Chief Complaint  Patient presents with   Hematemesis   Alcohol Problem    Referring Physician(s): Dahal,B  Supervising Physician: Arne Cleveland  Patient Status: [facility] - In-pt  History of Present Illness: [name] is a 51 y.o. male with past medical history of alcoholic cirrhosis,diabetes, anxiety, hypertension, remote left lower extremity DVT who was recently admitted to [facility] with black stools and coffee-ground emesis.  Patient underwent EGD yesterday which showed small esophageal varices and portal gastropathy.  Imaging also reveals a 4.6 cm hemorrhagic mass in segment 8 concerning for HCC along with fatty liver and splenomegaly.  Request now received for image guided liver mass biopsy for further evaluation.  Current labs include PT 18.3, INR 1.5, WBC 4.3, hemoglobin 8.4, platelets 41K, creatinine 0.54, T BILI 1.6.   Past Medical History:  Diagnosis Date   Alcoholic ([place])    Anxiety    Diabetes ([place])    DVT (deep venous thrombosis) ([place])    Hypertension     Past Surgical History:  Procedure Laterality Date   BLOOD PATCH     TONSILLECTOMY      Allergies: Patient has no known allergies.  Medications: Prior to Admission medications   Medication Sig Start Date End Date Taking? Authorizing Provider  ALPRAZolam Duanne Moron) 0.5 MG tablet Take 0.5 mg by mouth daily as needed for anxiety or sleep. 10/15/21  Yes [provider]  metFORMIN (GLUCOPHAGE) 500 MG tablet Take 1 tablet (500 mg total) by mouth 2 (two) times daily with a meal. 09/23/21  Yes Dykstra, Ellwood Dense, MD  traZODone (DESYREL) 150 MG tablet Take 75-150 mg by mouth at bedtime. 12/03/21  Yes [provider]  chlordiazePOXIDE (LIBRIUM) 25 MG capsule '50mg'$  PO TID x 1D, then 25-'50mg'$  PO BID X 1D, then 25-'50mg'$  PO QD X 1D Patient not taking: Reported on 12/25/2021 09/23/21   Lucrezia Starch, MD  folic acid (FOLVITE) 1 MG tablet Take 1 tablet (1 mg total) by mouth daily. Patient not taking: Reported on 12/25/2021 09/23/21   Lucrezia Starch, MD     Family History  Problem Relation Age of Onset   Hypertension Father     Social History   Socioeconomic History   Marital status: Married    Spouse name: Not on file   Number of children: Not on file   Years of education: Not on file   Highest education level: Not on file  Occupational History   Not on file  Tobacco Use   Smoking status: Never   Smokeless tobacco: Never  Vaping Use   Vaping Use: Never used  Substance and Sexual Activity   Alcohol use: Yes    Alcohol/week: 12.0 standard drinks of alcohol    Types: 12 Cans of beer per week    Comment: Beer/Hourly   Drug use: No    Comment: marijuana in the past   Sexual activity: Not on file  Other Topics Concern   Not on file  Social History Narrative   Pt works odd Architect jobs and delivers furniture. Lives with fiancee in [place], [place].    Social Determinants of Health   Financial Resource Strain: Not on file  Food Insecurity: No Food Insecurity (12/24/2021)   Hunger Vital Sign    Worried About Running Out of Food in the Last Year: Never true    Ran Out of Food in the Last Year:  Never true  Transportation Needs: No Transportation Needs (12/24/2021)   PRAPARE - Hydrologist (Medical): No    Lack of Transportation (Non-Medical): No  Physical Activity: Not on file  Stress: Not on file  Social Connections: Not on file      Review of Systems patient currently denies fever, headache, chest pain, dyspnea, cough, abdominal/back pain, nausea, vomiting or visible bleeding  Vital Signs: BP (!) 135/101 (BP Location: Right Arm)   Pulse (!) 105   Temp 98.2 F (36.8 C)   Resp 20   Ht '5\' 7"'$  (1.702 m)   Wt 156 lb 12 oz (71.1 kg)   SpO2 99%   BMI 24.55 kg/m     Physical Exam awake, alert.  Chest clear to  auscultation bilaterally.  Heart with slightly tachycardic but regular rhythm.  Abdomen soft, positive bowel sounds, nontender.  No lower extremity edema.  Imaging: MR LIVER W WO CONTRAST  Result Date: 12/25/2021 CLINICAL DATA:  Cirrhosis, liver lesion on CT, evaluate for HCC EXAM: MRI ABDOMEN WITHOUT AND WITH CONTRAST TECHNIQUE: Multiplanar multisequence MR imaging of the abdomen was performed both before and after the administration of intravenous contrast. CONTRAST:  7 mL Vueway IV COMPARISON:  CTA abdomen/pelvis dated 12/24/2021. CT abdomen/pelvis dated 08/06/2012. FINDINGS: Lower chest: Lung bases are clear. Hepatobiliary: Nodular hepatic contour, reflecting cirrhosis. Severe hepatic steatosis. 4.6 x 4.0 cm mass in segment 8 (series 5/image 10), demonstrating intrinsic T1 hyperintensity, suggesting hemorrhage. On postcontrast subtraction imaging, there is absolutely no enhancement within the lesion, although this may be obscured by hemorrhage. Progressive rim enhancement was present on CT and is likely also present on MR. Lesion is new from 2014. Technically speaking, the lack of non rim enhancement makes this difficult to characterize via LI-RADS. However, the presence of a large lesion in a cirrhotic patient, new from 2014, with internal hemorrhage are all findings there are concerning for [facility] until proven otherwise. Gallbladder is unremarkable. No intrahepatic or extrahepatic ductal dilatation. Pancreas:  Within normal limits. Spleen: Enlarged, measuring 15.2 cm in maximal craniocaudal dimension. Adrenals/Urinary Tract:  Adrenal glands are within normal limits. Kidneys are within normal limits.  No hydronephrosis. Stomach/Bowel: Stomach is notable for a moderate hiatal hernia. Visualized bowel is grossly unremarkable. Vascular/Lymphatic: No evidence of abdominal aortic aneurysm. Portal vein is patent. No suspicious abdominal lymphadenopathy. Other:  No abdominal ascites. Musculoskeletal: No focal  osseous lesions. IMPRESSION: 4.6 cm hemorrhagic mass in segment 8, difficult to characterize by LI-RADS due to lack non rim enhancement, but nonetheless concerning for [facility] until proven otherwise. Consider tissue sampling and/or follow-up MR in 3 months. Cirrhosis.  Splenomegaly.  Portal vein is patent. Severe hepatic steatosis. Electronically Signed   By: Julian Hy M.D.   On: 12/25/2021 19:42   CT ANGIO GI BLEED  Result Date: 12/24/2021 CLINICAL DATA:  Left upper quadrant pain. Possible pancreatitis. Evaluate for GI bleed. Vomiting last night black in nature. Alcohol abuse. EXAM: CTA ABDOMEN AND PELVIS WITHOUT AND WITH CONTRAST TECHNIQUE: Multidetector CT imaging of the abdomen and pelvis was performed using the standard protocol during bolus administration of intravenous contrast. Multiplanar reconstructed images and MIPs were obtained and reviewed to evaluate the vascular anatomy. RADIATION DOSE REDUCTION: This exam was performed according to the departmental dose-optimization program which includes automated exposure control, adjustment of the mA and/or kV according to patient size and/or use of iterative reconstruction technique. CONTRAST:  1106m OMNIPAQUE IOHEXOL 350 MG/ML SOLN COMPARISON:  08/06/2012 FINDINGS: VASCULAR Aorta: Normal  in caliber without evidence of aneurysm or dissection. Minimal calcified plaque is present. Celiac: Patent without evidence of aneurysm, dissection, vasculitis or significant stenosis. SMA: Patent without evidence of aneurysm, dissection, vasculitis or significant stenosis. Renals: Both renal arteries are patent without evidence of aneurysm, dissection, vasculitis, fibromuscular dysplasia or significant stenosis. There is an accessory right renal artery which is patent. Mild calcified plaque over the proximal more inferior right renal artery. IMA: Patent without evidence of aneurysm, dissection, vasculitis or significant stenosis. Inflow: Patent without evidence of  aneurysm, dissection, vasculitis or significant stenosis. Proximal Outflow: Bilateral common femoral and visualized portions of the superficial and profunda femoral arteries are patent without evidence of aneurysm, dissection, vasculitis or significant stenosis. Veins: No obvious venous abnormality within the limitations of this arterial phase study. Review of the MIP images confirms the above findings. NON-VASCULAR Lower chest: Lung bases are clear. Moderate size hiatal hernia is present. Hepatobiliary: There is a 4.6 cm well-defined oval mass over the right lobe of the liver with hypodense center. Small hypodense liver with nodular contour compatible with cirrhosis. Gallbladder and biliary tree are normal. Pancreas: Normal. Spleen: Mild splenomegaly. Adrenals/Urinary Tract: Adrenal glands are normal. Kidneys are normal in size without hydronephrosis or nephrolithiasis. Bladder and ureters are normal. Stomach/Bowel: Moderate size hiatal hernia. Small bowel is otherwise unremarkable. Appendix is normal. Colon is unremarkable. Small amount of retained oral contrast over the terminal ileum and cecum. Lymphatic: No significant adenopathy. Reproductive: Normal. Other: No free fluid or focal inflammatory change. Musculoskeletal: No focal abnormality. IMPRESSION: VASCULAR: 1.  No evidence of acute GI bleed. 2.  Mild aortic atherosclerosis. NONVASCULAR: 1.  No acute findings in the abdomen/pelvis. 2. Evidence of patient's known cirrhosis with associated splenomegaly. There is a 4.6 cm mass over the right lobe of the liver suspicious for hepatocellular carcinoma. Recommend MRI for further evaluation. 3.  Moderate size hiatal hernia. Electronically Signed   By: Marin Olp M.D.   On: 12/24/2021 12:58   DG Chest Portable 1 View  Result Date: 12/24/2021 CLINICAL DATA:  Chest pain EXAM: PORTABLE CHEST 1 VIEW COMPARISON:  Chest radiograph 08/10/2012 FINDINGS: The heart size and mediastinal contours are within normal  limits. Both lungs are clear. The visualized skeletal structures are unremarkable. IMPRESSION: No radiographic finding to explain chest pain Electronically Signed   By: Marin Roberts M.D.   On: 12/24/2021 12:04    Labs:  CBC: Recent Labs    12/25/21 1343 12/25/21 1957 12/26/21 0923 12/27/21 0400  WBC 1.9* 1.9* 2.0* 4.3  HGB 7.3* 7.5* 7.8* 8.4*  HCT 21.1* 22.5* 23.3* 24.7*  PLT 21* 21* 23* 41*    COAGS: Recent Labs    12/24/21 1143 12/27/21 0400  INR 1.6* 1.5*    BMP: Recent Labs    12/24/21 2031 12/25/21 0926 12/26/21 0923 12/27/21 0400  NA 131* 135 136 137  K 3.8 3.4* 3.3* 3.7  CL 100 104 100 101  CO2 '22 25 26 28  '$ GLUCOSE 191* 157* 164* 186*  BUN '14 14 9 11  '$ CALCIUM 7.5* 7.7* 7.7* 8.5*  CREATININE 0.60* 0.58* 0.68 0.54*  GFRNONAA >60 >60 >60 >60    LIVER FUNCTION TESTS: Recent Labs    12/23/21 1200 12/24/21 1143 12/25/21 0926 12/26/21 0923  BILITOT 2.4* 2.1* 1.4* 1.6*  AST 192* 422* 227* 187*  ALT 161* 303* 221* 213*  ALKPHOS 60 45 37* 36*  PROT 7.2 6.1* 5.2* 5.2*  ALBUMIN 4.2 3.6 3.1* 3.1*    TUMOR MARKERS: No results  for input(s): "AFPTM", "CEA", "CA199", "CHROMGRNA" in the last 8760 hours.  Assessment and Plan: 51 y.o. male with past medical history of alcoholic cirrhosis,diabetes, anxiety, hypertension, remote left lower extremity DVT who was recently admitted to [facility] with black stools and coffee-ground emesis.  Patient underwent EGD yesterday which showed small esophageal varices and portal gastropathy.  Imaging also reveals a 4.6 cm hemorrhagic mass in segment 8 concerning for HCC along with fatty liver and splenomegaly.  Request now received for image guided liver mass biopsy for further evaluation.  Current labs include PT 18.3, INR 1.5, WBC 4.3, hemoglobin 8.4, platelets 41K, creatinine 0.54, T BILI 1.6.  Imaging studies have been reviewed by Dr. Vernard Gambles.Risks and benefits of procedure was discussed with the patient including,  but not limited to bleeding, infection, damage to adjacent structures or low yield requiring additional tests.  All of the questions were answered and there is agreement to proceed.  Consent signed and in chart. Procedure scheduled for today. Pt to receive 1 unit plts today.     Thank you for this interesting consult.  I greatly enjoyed meeting Avyaan Summer and look forward to participating in their care.  A copy of this report was sent to the requesting provider on this date.  Electronically Signed: D. Rowe Robert, PA-C 12/27/2021, 8:50 AM   I spent a total of 25 minutes    in face to face in clinical consultation, greater than 50% of which was counseling/coordinating care for image guided right liver mass biopsy

## 2021-12-27 NOTE — Plan of Care (Signed)
  Problem: Clinical Measurements: Goal: Diagnostic test results will improve Outcome: Not Progressing   Problem: Coping: Goal: Level of anxiety will decrease Outcome: Not Progressing   Problem: Safety: Goal: Ability to remain free from injury will improve Outcome: Not Progressing

## 2021-12-28 LAB — CBC
HCT: 26.6 % — ABNORMAL LOW (ref 39.0–52.0)
Hemoglobin: 8.9 g/dL — ABNORMAL LOW (ref 13.0–17.0)
MCH: 27.5 pg (ref 26.0–34.0)
MCHC: 33.5 g/dL (ref 30.0–36.0)
MCV: 82.1 fL (ref 80.0–100.0)
Platelets: 30 10*3/uL — ABNORMAL LOW (ref 150–400)
RBC: 3.24 MIL/uL — ABNORMAL LOW (ref 4.22–5.81)
RDW: 19.3 % — ABNORMAL HIGH (ref 11.5–15.5)
WBC: 2.2 10*3/uL — ABNORMAL LOW (ref 4.0–10.5)
nRBC: 0 % (ref 0.0–0.2)

## 2021-12-28 LAB — BASIC METABOLIC PANEL
Anion gap: 8 (ref 5–15)
BUN: 9 mg/dL (ref 6–20)
CO2: 28 mmol/L (ref 22–32)
Calcium: 8.2 mg/dL — ABNORMAL LOW (ref 8.9–10.3)
Chloride: 100 mmol/L (ref 98–111)
Creatinine, Ser: 0.68 mg/dL (ref 0.61–1.24)
GFR, Estimated: >60 mL/min (ref >60)
Glucose, Bld: 126 mg/dL — ABNORMAL HIGH (ref 70–99)
Potassium: 2.8 mmol/L — ABNORMAL LOW (ref 3.5–5.1)
Sodium: 136 mmol/L (ref 135–145)

## 2021-12-28 LAB — GLUCOSE, CAPILLARY
Glucose-Capillary: 117 mg/dL — ABNORMAL HIGH (ref 70–99)
Glucose-Capillary: 118 mg/dL — ABNORMAL HIGH (ref 70–99)
Glucose-Capillary: 148 mg/dL — ABNORMAL HIGH (ref 70–99)
Glucose-Capillary: 266 mg/dL — ABNORMAL HIGH (ref 70–99)

## 2021-12-28 LAB — MAGNESIUM: Magnesium: 1.9 mg/dL (ref 1.7–2.4)

## 2021-12-28 LAB — PHOSPHORUS: Phosphorus: 4 mg/dL (ref 2.5–4.6)

## 2021-12-28 MED ORDER — FOLIC ACID 1 MG PO TABS
1.0000 mg | ORAL_TABLET | Freq: Every day | ORAL | Status: DC
  Administered 2021-12-28: 1 mg via ORAL
  Filled 2021-12-28: qty 1

## 2021-12-28 MED ORDER — PANTOPRAZOLE SODIUM 40 MG PO TBEC: 40.0000 mg | DELAYED_RELEASE_TABLET | Freq: Two times a day (BID) | ORAL | 2 refills | Status: DC

## 2021-12-28 MED ORDER — THIAMINE MONONITRATE 100 MG PO TABS
100.0000 mg | ORAL_TABLET | Freq: Every day | ORAL | Status: DC
  Administered 2021-12-28: 100 mg via ORAL
  Filled 2021-12-28: qty 1

## 2021-12-28 MED ORDER — PNEUMOCOCCAL 20-VAL CONJ VACC 0.5 ML IM SUSY
0.5000 mL | PREFILLED_SYRINGE | Freq: Once | INTRAMUSCULAR | Status: AC
  Administered 2021-12-28: 0.5 mL via INTRAMUSCULAR
  Filled 2021-12-28: qty 0.5

## 2021-12-28 MED ORDER — POTASSIUM CHLORIDE CRYS ER 20 MEQ PO TBCR
40.0000 meq | EXTENDED_RELEASE_TABLET | ORAL | Status: AC
  Administered 2021-12-28 (×2): 40 meq via ORAL
  Filled 2021-12-28 (×2): qty 2

## 2021-12-28 NOTE — Discharge Summary (Signed)
Physician Discharge Summary  Ra Pfiester [facility] OEU:[id number] DOB: December 22, 1970 DOA: 12/24/2021  PCP: [name], MD  Admit date: 12/24/2021 Discharge date: 12/28/2021  Admitted From: Home Discharge disposition: Home  Recommendations at discharge:  Stop alcohol Follow-up with oncology as an outpatient for liver biopsy report and plan Indefinite Protonix twice daily    Brief narrative: [name] is a 51 y.o. male with PMH significant for chronic alcoholism, DM 2, HTN, DVT, anxiety. Patient presented to the ED on 10/3 with complaint of hematemesis.  Patient reports that on 10/1, while watching a race, he drank about 10 beers.  He woke up in the night with abdominal pain, went to the bathroom and had coffee-ground emesis.  For the next 24 hours, he had 3-4 more episodes and also started having black stools and hence came to the ED on 10/2.  In the ED, hemoglobin was 9.1, platelet low at 35, LFTs elevated.  FOBT positive CT angio did not show any active GI bleed, it showed evidence of cirrhosis and 4.6 cm mass in the right lobe of the liver suspicious for HCC. Patient was started on IV octreotide, IV Rocephin Admitted to hospitalist service GI consulted See below for details  Subjective: Patient was seen and examined this morning.  Lying down in bed.  Not in withdrawal symptoms.  Uneventful after liver biopsy yesterday.  Hemoglobin stable.  Hospital course: Acute GI bleeding Presented with hematemesis after binge drinking in the setting of alcoholic liver cirrhosis Initially started on Protonix and octreotide drip.  Seen by GI.  Underwent EGD 10/5.  Per EGD report, patient had grade 1 and small <5 mm esophageal varices, portal hypertensive gastropathy. Protonix drip and octreotide drip was stopped.  Patient was started on Protonix twice daily which she needs to continue it indefinitely.  Acute blood loss anemia Baseline hemoglobin more than 13.  Hemoglobin down trended to  the lowest of 7.3 because of GI bleeding.  No PRBC transfusion given so far.  Hemoglobin improved to 8.9 today. Recent Labs    12/24/21 2031 12/25/21 0926 12/25/21 1343 12/25/21 1957 12/26/21 0923 12/27/21 0400 12/28/21 0854  HGB 8.0*   < > 7.3* 7.5* 7.8* 8.4* 8.9*  MCV 78.8*   < > 80.5 81.2 81.5 81.0 82.1  VITAMINB12 679  --   --   --   --   --   --   FOLATE 5.4*  --   --   --   --   --   --   FERRITIN 61  --   --   --   --   --   --   TIBC 293  --   --   --   --   --   --   IRON 49  --   --   --   --   --   --   RETICCTPCT 4.2*  --   --   --   --   --   --    < > = values in this interval not displayed.   New liver mass CT scan obtained on admission also showed a 4.6 cm mass in the right lobe of the liver suspicious for HCC.  Hepatitis panel nonreactive. 10/4, MRI abdomen showed 4.6 cm hemorrhagic mass difficult to characterize by LI-RADS due to lack non rim enhancement, but is still concerning for [facility] until proven otherwise.  10/6, underwent biopsy by IR.  Pending report.  Oncology consultation was obtained.  Patient to follow-up with oncology Dr. Burr Medico as an outpatient.  Hypokalemia/hypophosphatemia Labs from this morning with potassium low at 2.8.  Oral potassium replacement given.  Phosphorus level was low 2 days ago which improved with replacement. Recent Labs  Lab 12/24/21 2031 12/25/21 0926 12/26/21 0923 12/27/21 0400 12/28/21 0854  K 3.8 3.4* 3.3* 3.7 2.8*  MG 2.0 2.1 1.8  --  1.9  PHOS 1.8* 1.8* 2.8  --  4.0   Alcohol liver cirrhosis Chronic alcoholism At risk of withdrawal Patient was kept on CIWA protocol with scheduled Valium and as needed Ativan.  He did not have major withdrawal symptoms.  Chronic thrombocytopenia Platelet level was low at 15 on 10/3.  So far received 3 units of platelet transfusion.  Platelet 30 today.   Recent Labs  Lab 12/23/21 1200 12/24/21 1143 12/24/21 1441 12/24/21 2031 12/25/21 0926 12/25/21 1343 12/25/21 1957 12/26/21 0923  12/27/21 0400 12/28/21 0854  PLT 39* 35* 21* 15* 22* 21* 21* 23* 41* 30*    Type 2 diabetes mellitus A1c 6.9 on 12/24/2021 PTA on metformin 500 mg twice daily. Resume the same post discharge.  Wounds:  -    Discharge Exam:   Vitals:   12/28/21 0400 12/28/21 0435 12/28/21 0600 12/28/21 1227  BP:  125/76  127/75  Pulse:  76  75  Resp: '14 19 12 20  '$ Temp:  98.2 F (36.8 C)  98 F (36.7 C)  TempSrc:  Oral    SpO2:  99%  99%  Weight:      Height:        Body mass index is 24.55 kg/m.   General exam: Pleasant, middle-aged Caucasian male.  Not in physical distress Skin: No rashes, lesions or ulcers. HEENT: Atraumatic, normocephalic, no obvious bleeding Lungs: Clear to auscultation bilaterally CVS: Regular rate and rhythm, no murmur GI/Abd soft, mild diffuse tenderness, nondistended, bowel sound present CNS: Alert, awake, oriented x3.  No withdrawal symptoms. Psychiatry: Mood appropriate Extremities: No pedal edema, no calf tenderness  Follow ups:    Follow-up Information     [name], MD Follow up.   Specialty: Internal Medicine Contact information: [address] [place] [address] [phone]                 Discharge Instructions:   Discharge Instructions     Call MD for:  difficulty breathing, headache or visual disturbances   Complete by: As directed    Call MD for:  extreme fatigue   Complete by: As directed    Call MD for:  hives   Complete by: As directed    Call MD for:  persistant dizziness or light-headedness   Complete by: As directed    Call MD for:  persistant nausea and vomiting   Complete by: As directed    Call MD for:  severe uncontrolled pain   Complete by: As directed    Call MD for:  temperature >100.4   Complete by: As directed    Diet general   Complete by: As directed    Discharge instructions   Complete by: As directed    Recommendations at discharge:   Stop alcohol  Follow-up with oncology as an  outpatient for liver biopsy report and plan  Indefinite Protonix twice daily  General discharge instructions: Follow with Primary MD [name], MD in 7 days  Please request your PCP  to go over your hospital tests, procedures, radiology results at the follow up. Please  get your medicines reviewed and adjusted.  Your PCP may decide to repeat certain labs or tests as needed. Do not drive, operate heavy machinery, perform activities at heights, swimming or participation in water activities or provide baby sitting services if your were admitted for syncope or siezures until you have seen by Primary MD or a Neurologist and advised to do so again. [place] Controlled Substance Reporting System database was reviewed. Do not drive, operate heavy machinery, perform activities at heights, swim, participate in water activities or provide baby-sitting services while on medications for pain, sleep and mood until your outpatient physician has reevaluated you and advised to do so again.  You are strongly recommended to comply with the dose, frequency and duration of prescribed medications. Activity: As tolerated with Full fall precautions use walker/cane & assistance as needed Avoid using any recreational substances like cigarette, tobacco, alcohol, or non-prescribed drug. If you experience worsening of your admission symptoms, develop shortness of breath, life threatening emergency, suicidal or homicidal thoughts you must seek medical attention immediately by calling 911 or calling your MD immediately  if symptoms less severe. You must read complete instructions/literature along with all the possible adverse reactions/side effects for all the medicines you take and that have been prescribed to you. Take any new medicine only after you have completely understood and accepted all the possible adverse reactions/side effects.  Wear Seat belts while driving. You were cared for by a hospitalist during your hospital  stay. If you have any questions about your discharge medications or the care you received while you were in the hospital after you are discharged, you can call the unit and ask to speak with the hospitalist or the covering physician. Once you are discharged, your primary care physician will handle any further medical issues. Please note that NO REFILLS for any discharge medications will be authorized once you are discharged, as it is imperative that you return to your primary care physician (or establish a relationship with a primary care physician if you do not have one).   Discharge wound care:   Complete by: As directed    Increase activity slowly   Complete by: As directed        Discharge Medications:   Allergies as of 12/28/2021   No Known Allergies      Medication List     STOP taking these medications    chlordiazePOXIDE 25 MG capsule Commonly known as: LIBRIUM   folic acid 1 MG tablet Commonly known as: FOLVITE       TAKE these medications    ALPRAZolam 0.5 MG tablet Commonly known as: XANAX Take 0.5 mg by mouth daily as needed for anxiety or sleep.   metFORMIN 500 MG tablet Commonly known as: GLUCOPHAGE Take 1 tablet (500 mg total) by mouth 2 (two) times daily with a meal.   pantoprazole 40 MG tablet Commonly known as: PROTONIX Take 1 tablet (40 mg total) by mouth 2 (two) times daily before a meal.   traZODone 150 MG tablet Commonly known as: DESYREL Take 75-150 mg by mouth at bedtime.               Discharge Care Instructions  (From admission, onward)           Start     Ordered   12/28/21 0000  Discharge wound care:        12/28/21 1346             The results of significant  diagnostics from this hospitalization (including imaging, microbiology, ancillary and laboratory) are listed below for reference.    Procedures and Diagnostic Studies:   MR LIVER W WO CONTRAST  Result Date: 12/25/2021 CLINICAL DATA:  Cirrhosis, liver  lesion on CT, evaluate for HCC EXAM: MRI ABDOMEN WITHOUT AND WITH CONTRAST TECHNIQUE: Multiplanar multisequence MR imaging of the abdomen was performed both before and after the administration of intravenous contrast. CONTRAST:  7 mL Vueway IV COMPARISON:  CTA abdomen/pelvis dated 12/24/2021. CT abdomen/pelvis dated 08/06/2012. FINDINGS: Lower chest: Lung bases are clear. Hepatobiliary: Nodular hepatic contour, reflecting cirrhosis. Severe hepatic steatosis. 4.6 x 4.0 cm mass in segment 8 (series 5/image 10), demonstrating intrinsic T1 hyperintensity, suggesting hemorrhage. On postcontrast subtraction imaging, there is absolutely no enhancement within the lesion, although this may be obscured by hemorrhage. Progressive rim enhancement was present on CT and is likely also present on MR. Lesion is new from 2014. Technically speaking, the lack of non rim enhancement makes this difficult to characterize via LI-RADS. However, the presence of a large lesion in a cirrhotic patient, new from 2014, with internal hemorrhage are all findings there are concerning for [facility] until proven otherwise. Gallbladder is unremarkable. No intrahepatic or extrahepatic ductal dilatation. Pancreas:  Within normal limits. Spleen: Enlarged, measuring 15.2 cm in maximal craniocaudal dimension. Adrenals/Urinary Tract:  Adrenal glands are within normal limits. Kidneys are within normal limits.  No hydronephrosis. Stomach/Bowel: Stomach is notable for a moderate hiatal hernia. Visualized bowel is grossly unremarkable. Vascular/Lymphatic: No evidence of abdominal aortic aneurysm. Portal vein is patent. No suspicious abdominal lymphadenopathy. Other:  No abdominal ascites. Musculoskeletal: No focal osseous lesions. IMPRESSION: 4.6 cm hemorrhagic mass in segment 8, difficult to characterize by LI-RADS due to lack non rim enhancement, but nonetheless concerning for [facility] until proven otherwise. Consider tissue sampling and/or follow-up MR in 3 months.  Cirrhosis.  Splenomegaly.  Portal vein is patent. Severe hepatic steatosis. Electronically Signed   By: Julian Hy M.D.   On: 12/25/2021 19:42   CT ANGIO GI BLEED  Result Date: 12/24/2021 CLINICAL DATA:  Left upper quadrant pain. Possible pancreatitis. Evaluate for GI bleed. Vomiting last night black in nature. Alcohol abuse. EXAM: CTA ABDOMEN AND PELVIS WITHOUT AND WITH CONTRAST TECHNIQUE: Multidetector CT imaging of the abdomen and pelvis was performed using the standard protocol during bolus administration of intravenous contrast. Multiplanar reconstructed images and MIPs were obtained and reviewed to evaluate the vascular anatomy. RADIATION DOSE REDUCTION: This exam was performed according to the departmental dose-optimization program which includes automated exposure control, adjustment of the mA and/or kV according to patient size and/or use of iterative reconstruction technique. CONTRAST:  173m OMNIPAQUE IOHEXOL 350 MG/ML SOLN COMPARISON:  08/06/2012 FINDINGS: VASCULAR Aorta: Normal in caliber without evidence of aneurysm or dissection. Minimal calcified plaque is present. Celiac: Patent without evidence of aneurysm, dissection, vasculitis or significant stenosis. SMA: Patent without evidence of aneurysm, dissection, vasculitis or significant stenosis. Renals: Both renal arteries are patent without evidence of aneurysm, dissection, vasculitis, fibromuscular dysplasia or significant stenosis. There is an accessory right renal artery which is patent. Mild calcified plaque over the proximal more inferior right renal artery. IMA: Patent without evidence of aneurysm, dissection, vasculitis or significant stenosis. Inflow: Patent without evidence of aneurysm, dissection, vasculitis or significant stenosis. Proximal Outflow: Bilateral common femoral and visualized portions of the superficial and profunda femoral arteries are patent without evidence of aneurysm, dissection, vasculitis or significant  stenosis. Veins: No obvious venous abnormality within the limitations of this arterial phase  study. Review of the MIP images confirms the above findings. NON-VASCULAR Lower chest: Lung bases are clear. Moderate size hiatal hernia is present. Hepatobiliary: There is a 4.6 cm well-defined oval mass over the right lobe of the liver with hypodense center. Small hypodense liver with nodular contour compatible with cirrhosis. Gallbladder and biliary tree are normal. Pancreas: Normal. Spleen: Mild splenomegaly. Adrenals/Urinary Tract: Adrenal glands are normal. Kidneys are normal in size without hydronephrosis or nephrolithiasis. Bladder and ureters are normal. Stomach/Bowel: Moderate size hiatal hernia. Small bowel is otherwise unremarkable. Appendix is normal. Colon is unremarkable. Small amount of retained oral contrast over the terminal ileum and cecum. Lymphatic: No significant adenopathy. Reproductive: Normal. Other: No free fluid or focal inflammatory change. Musculoskeletal: No focal abnormality. IMPRESSION: VASCULAR: 1.  No evidence of acute GI bleed. 2.  Mild aortic atherosclerosis. NONVASCULAR: 1.  No acute findings in the abdomen/pelvis. 2. Evidence of patient's known cirrhosis with associated splenomegaly. There is a 4.6 cm mass over the right lobe of the liver suspicious for hepatocellular carcinoma. Recommend MRI for further evaluation. 3.  Moderate size hiatal hernia. Electronically Signed   By: Marin Olp M.D.   On: 12/24/2021 12:58   DG Chest Portable 1 View  Result Date: 12/24/2021 CLINICAL DATA:  Chest pain EXAM: PORTABLE CHEST 1 VIEW COMPARISON:  Chest radiograph 08/10/2012 FINDINGS: The heart size and mediastinal contours are within normal limits. Both lungs are clear. The visualized skeletal structures are unremarkable. IMPRESSION: No radiographic finding to explain chest pain Electronically Signed   By: Marin Roberts M.D.   On: 12/24/2021 12:04     Labs:   Basic Metabolic Panel: Recent  Labs  Lab 12/24/21 2031 12/25/21 0926 12/26/21 0923 12/27/21 0400 12/28/21 0854  NA 131* 135 136 137 136  K 3.8 3.4* 3.3* 3.7 2.8*  CL 100 104 100 101 100  CO2 '22 25 26 28 28  '$ GLUCOSE 191* 157* 164* 186* 126*  BUN '14 14 9 11 9  '$ CREATININE 0.60* 0.58* 0.68 0.54* 0.68  CALCIUM 7.5* 7.7* 7.7* 8.5* 8.2*  MG 2.0 2.1 1.8  --  1.9  PHOS 1.8* 1.8* 2.8  --  4.0   GFR Estimated Creatinine Clearance: 99.8 mL/min (by C-G formula based on SCr of 0.68 mg/dL). Liver Function Tests: Recent Labs  Lab 12/23/21 1200 12/24/21 1143 12/25/21 0926 12/26/21 0923  AST 192* 422* 227* 187*  ALT 161* 303* 221* 213*  ALKPHOS 60 45 37* 36*  BILITOT 2.4* 2.1* 1.4* 1.6*  PROT 7.2 6.1* 5.2* 5.2*  ALBUMIN 4.2 3.6 3.1* 3.1*   Recent Labs  Lab 12/23/21 1200 12/24/21 1143  LIPASE 33 46   Recent Labs  Lab 12/24/21 2031  AMMONIA 56*   Coagulation profile Recent Labs  Lab 12/24/21 1143 12/27/21 0400  INR 1.6* 1.5*    CBC: Recent Labs  Lab 12/23/21 1200 12/24/21 1143 12/24/21 1441 12/25/21 1343 12/25/21 1957 12/26/21 0923 12/27/21 0400 12/28/21 0854  WBC 6.7 7.3   < > 1.9* 1.9* 2.0* 4.3 2.2*  NEUTROABS 5.7 5.8  --   --   --   --  3.5  --   HGB 13.4 9.9*   < > 7.3* 7.5* 7.8* 8.4* 8.9*  HCT 39.0 28.0*   < > 21.1* 22.5* 23.3* 24.7* 26.6*  MCV 77.2* 75.7*   < > 80.5 81.2 81.5 81.0 82.1  PLT 39* 35*   < > 21* 21* 23* 41* 30*   < > = values in this interval not displayed.  Cardiac Enzymes: Recent Labs  Lab 12/24/21 2031  CKTOTAL 190   BNP: Invalid input(s): "POCBNP" CBG: Recent Labs  Lab 12/27/21 2003 12/28/21 0001 12/28/21 0433 12/28/21 0807 12/28/21 1204  GLUCAP 154* 118* 148* 117* 266*   D-Dimer No results for input(s): "DDIMER" in the last 72 hours. Hgb A1c No results for input(s): "HGBA1C" in the last 72 hours. Lipid Profile No results for input(s): "CHOL", "HDL", "LDLCALC", "TRIG", "CHOLHDL", "LDLDIRECT" in the last 72 hours. Thyroid function studies No  results for input(s): "TSH", "T4TOTAL", "T3FREE", "THYROIDAB" in the last 72 hours.  Invalid input(s): "FREET3" Anemia work up No results for input(s): "VITAMINB12", "FOLATE", "FERRITIN", "TIBC", "IRON", "RETICCTPCT" in the last 72 hours. Microbiology No results found for this or any previous visit (from the past 240 hour(s)).  Time coordinating discharge: 35 minutes  Signed: Chani Ghanem  [facility] 12/28/2021, 1:46 PM

## 2021-12-28 NOTE — TOC Transition Note (Signed)
Transition of Care [facility]) - CM/SW Discharge Note   Patient Details  Name: [name] MRN: [id number] Date of Birth: 1970/07/20  Transition of Care [facility]) CM/SW Contact:  Leeroy Cha, RN Phone Number: 12/28/2021, 2:13 PM   Clinical Narrative:     [id number] discharged to return home.  Chart reviewed for TOC needs.  None found.  Patient self care.   Final next level of care: Home/Self Care Barriers to Discharge: Barriers Resolved   Patient Goals and CMS Choice Patient states their goals for this hospitalization and ongoing recovery are:: return home   Choice offered to / list presented to : NA  Discharge Placement                       Discharge Plan and Services In-house Referral: NA Discharge Planning Services: CM Consult Post Acute Care Choice: NA          DME Arranged: N/A DME Agency: NA       HH Arranged: NA HH Agency: NA        Social Determinants of Health (SDOH) Interventions Housing Interventions: Intervention Not Indicated   Readmission Risk Interventions   No data to display

## 2021-12-28 NOTE — Progress Notes (Signed)
Subjective: Patient states bowel movement today was without any black stools. He did well post liver biopsy.  Objective: Vital signs in last 24 hours: Temp:  [98 F (36.7 C)-98.2 F (36.8 C)] 98.2 F (36.8 C) (10/07 0435) Pulse Rate:  [64-89] 76 (10/07 0435) Resp:  [12-20] 12 (10/07 0600) BP: (95-143)/(55-93) 125/76 (10/07 0435) SpO2:  [97 %-100 %] 99 % (10/07 0435) Weight change:  Last BM Date : 12/24/21  PE: Alert, awake, oriented x3 GENERAL: Mild pallor  ABDOMEN: Soft, nondistended, nontender abdomen EXTREMITIES: No deformity  Lab Results: Results for orders placed or performed during the hospital encounter of 12/24/21 (from the past 48 hour(s))  Glucose, capillary     Status: Abnormal   Collection Time: 12/26/21  1:09 PM  Result Value Ref Range   Glucose-Capillary 194 (H) 70 - 99 mg/dL    Comment: Glucose reference range applies only to samples taken after fasting for at least 8 hours.  Glucose, capillary     Status: Abnormal   Collection Time: 12/26/21  5:16 PM  Result Value Ref Range   Glucose-Capillary 196 (H) 70 - 99 mg/dL    Comment: Glucose reference range applies only to samples taken after fasting for at least 8 hours.  Glucose, capillary     Status: Abnormal   Collection Time: 12/26/21  8:09 PM  Result Value Ref Range   Glucose-Capillary 350 (H) 70 - 99 mg/dL    Comment: Glucose reference range applies only to samples taken after fasting for at least 8 hours.   Comment 1 Notify RN   Glucose, capillary     Status: Abnormal   Collection Time: 12/26/21 11:33 PM  Result Value Ref Range   Glucose-Capillary 331 (H) 70 - 99 mg/dL    Comment: Glucose reference range applies only to samples taken after fasting for at least 8 hours.  Basic metabolic panel     Status: Abnormal   Collection Time: 12/27/21  4:00 AM  Result Value Ref Range   Sodium 137 135 - 145 mmol/L   Potassium 3.7 3.5 - 5.1 mmol/L   Chloride 101 98 - 111 mmol/L   CO2 28 22 - 32 mmol/L   Glucose,  Bld 186 (H) 70 - 99 mg/dL    Comment: Glucose reference range applies only to samples taken after fasting for at least 8 hours.   BUN 11 6 - 20 mg/dL   Creatinine, Ser 0.54 (L) 0.61 - 1.24 mg/dL   Calcium 8.5 (L) 8.9 - 10.3 mg/dL   GFR, Estimated >60 >60 mL/min    Comment: (NOTE) Calculated using the CKD-EPI Creatinine Equation (2021)    Anion gap 8 5 - 15    Comment: Performed at [facility], [place] [address] [place], [place] [address]  CBC with Differential/Platelet     Status: Abnormal   Collection Time: 12/27/21  4:00 AM  Result Value Ref Range   WBC 4.3 4.0 - 10.5 K/uL   RBC 3.05 (L) 4.22 - 5.81 MIL/uL   Hemoglobin 8.4 (L) 13.0 - 17.0 g/dL   HCT 24.7 (L) 39.0 - 52.0 %   MCV 81.0 80.0 - 100.0 fL   MCH 27.5 26.0 - 34.0 pg   MCHC 34.0 30.0 - 36.0 g/dL   RDW 19.0 (H) 11.5 - 15.5 %   Platelets 41 (L) 150 - 400 K/uL    Comment: Immature Platelet Fraction may be clinically indicated, consider ordering this additional test [id number]    nRBC 0.0 0.0 -  0.2 %   Neutrophils Relative % 80 %   Neutro Abs 3.5 1.7 - 7.7 K/uL   Lymphocytes Relative 9 %   Lymphs Abs 0.4 (L) 0.7 - 4.0 K/uL   Monocytes Relative 10 %   Monocytes Absolute 0.4 0.1 - 1.0 K/uL   Eosinophils Relative 0 %   Eosinophils Absolute 0.0 0.0 - 0.5 K/uL   Basophils Relative 0 %   Basophils Absolute 0.0 0.0 - 0.1 K/uL   Immature Granulocytes 1 %   Abs Immature Granulocytes 0.04 0.00 - 0.07 K/uL    Comment: Performed at [facility], [place] [address], [place], [place] [address]  Protime-INR     Status: Abnormal   Collection Time: 12/27/21  4:00 AM  Result Value Ref Range   Prothrombin Time 18.3 (H) 11.4 - 15.2 seconds   INR 1.5 (H) 0.8 - 1.2    Comment: (NOTE) INR goal varies based on device and disease states. Performed at [facility] [place] [address], [place], [place] [address]   Glucose, capillary     Status: Abnormal   Collection Time: 12/27/21  4:07 AM   Result Value Ref Range   Glucose-Capillary 177 (H) 70 - 99 mg/dL    Comment: Glucose reference range applies only to samples taken after fasting for at least 8 hours.  Glucose, capillary     Status: Abnormal   Collection Time: 12/27/21  8:06 AM  Result Value Ref Range   Glucose-Capillary 144 (H) 70 - 99 mg/dL    Comment: Glucose reference range applies only to samples taken after fasting for at least 8 hours.  Prepare platelet pheresis     Status: None (Preliminary result)   Collection Time: 12/27/21  8:52 AM  Result Value Ref Range   Unit Number [id number]    Blood Component Type PLTP2 PSORALEN TREATED    Unit division 00    Status of Unit ISSUED    Transfusion Status      OK TO TRANSFUSE Performed at [place] [address], [place],  [address]   Glucose, capillary     Status: Abnormal   Collection Time: 12/27/21 11:23 AM  Result Value Ref Range   Glucose-Capillary 165 (H) 70 - 99 mg/dL    Comment: Glucose reference range applies only to samples taken after fasting for at least 8 hours.  Glucose, capillary     Status: Abnormal   Collection Time: 12/27/21  4:25 PM  Result Value Ref Range   Glucose-Capillary 219 (H) 70 - 99 mg/dL    Comment: Glucose reference range applies only to samples taken after fasting for at least 8 hours.  Glucose, capillary     Status: Abnormal   Collection Time: 12/27/21  8:03 PM  Result Value Ref Range   Glucose-Capillary 154 (H) 70 - 99 mg/dL    Comment: Glucose reference range applies only to samples taken after fasting for at least 8 hours.  Glucose, capillary     Status: Abnormal   Collection Time: 12/28/21 12:01 AM  Result Value Ref Range   Glucose-Capillary 118 (H) 70 - 99 mg/dL    Comment: Glucose reference range applies only to samples taken after fasting for at least 8 hours.  Glucose, capillary     Status: Abnormal   Collection Time: 12/28/21  4:33 AM  Result Value Ref Range   Glucose-Capillary 148 (H)  70 - 99 mg/dL    Comment: Glucose reference range applies only to samples  taken after fasting for at least 8 hours.  Glucose, capillary     Status: Abnormal   Collection Time: 12/28/21  8:07 AM  Result Value Ref Range   Glucose-Capillary 117 (H) 70 - 99 mg/dL    Comment: Glucose reference range applies only to samples taken after fasting for at least 8 hours.  CBC     Status: Abnormal   Collection Time: 12/28/21  8:54 AM  Result Value Ref Range   WBC 2.2 (L) 4.0 - 10.5 K/uL   RBC 3.24 (L) 4.22 - 5.81 MIL/uL   Hemoglobin 8.9 (L) 13.0 - 17.0 g/dL   HCT 26.6 (L) 39.0 - 52.0 %   MCV 82.1 80.0 - 100.0 fL   MCH 27.5 26.0 - 34.0 pg   MCHC 33.5 30.0 - 36.0 g/dL   RDW 19.3 (H) 11.5 - 15.5 %   Platelets 30 (L) 150 - 400 K/uL    Comment: Immature Platelet Fraction may be clinically indicated, consider ordering this additional test [id number] CONSISTENT WITH PREVIOUS RESULT REPEATED TO VERIFY    nRBC 0.0 0.0 - 0.2 %    Comment: Performed at [facility], [place] [address], [place], [place] [address]    Studies/Results: US BIOPSY (LIVER)  Result Date: 12/27/2021 CLINICAL DATA:  Cirrhosis, right lobe liver lesion indeterminate on MRI EXAM: ULTRASOUND-GUIDED CORE LIVER BIOPSY TECHNIQUE: An ultrasound guided liver biopsy was thoroughly discussed with the patient and questions were answered. The benefits, risks, alternatives, and complications were also discussed. The patient understands and wishes to proceed with the procedure. A verbal as well as written consent was obtained. Survey ultrasound of the liver was performed, right lobe lesion localized, and an appropriate skin entry site was determined. Skin site was marked, prepped with chlorhexidine, and draped in usual sterile fashion, and infiltrated locally with 1% lidocaine. Intravenous Fentanyl 177mg and Versed '2mg'$  were administered as conscious sedation during continuous monitoring of the patient's level of consciousness and  physiological / cardiorespiratory status by the radiology RN, with a total moderate sedation time of 11 minutes. A 17 gauge trocar needle was advanced under ultrasound guidance into the liver to the margin of the lesion. Multiple coaxial 18gauge core samples were then obtained through the guide needle. The guide needle was removed. Post procedure scans demonstrate no apparent complication. COMPLICATIONS: COMPLICATIONS None immediate FINDINGS: Hypoechoic right lobe lesion was localized corresponding to MR findings. Representative core biopsy samples obtained as above. IMPRESSION: Technically successful ultrasound guided core biopsy of right lobe liver lesion. Electronically Signed   By: DLucrezia EuropeM.D.   On: 12/27/2021 14:23    Medications: I have reviewed the patient's current medications.  Assessment: Alcoholic liver cirrhosis, portal hypertensive gastropathy Liver mass, status postbiopsy, results pending  Plan: Recommend follow-up of liver mass with oncology/ From GI standpoint okay to be discharged home on PPI twice a day indefinitely. Patient advised to completely refrain from alcohol use. GI will sign off, please recall if needed.  ARonnette Juniper MD 12/28/2021, 9:58 AM

## 2021-12-30 ENCOUNTER — Encounter (HOSPITAL_COMMUNITY): Payer: Self-pay | Admitting: Gastroenterology

## 2021-12-30 ENCOUNTER — Telehealth: Payer: Self-pay | Admitting: Hematology

## 2021-12-30 LAB — BPAM PLATELET PHERESIS
Blood Product Expiration Date: 202310062359
ISSUE DATE / TIME: 202310061203
Unit Type and Rh: 6200

## 2021-12-30 LAB — PREPARE PLATELET PHERESIS: Unit division: 0

## 2021-12-30 LAB — SURGICAL PATHOLOGY

## 2021-12-30 NOTE — Telephone Encounter (Signed)
Scheduled appt per 10/6 staff msg. Pt is aware of appt date/time. Pt is aware appt will be a telephone visit.

## 2022-01-01 ENCOUNTER — Inpatient Hospital Stay: Payer: Self-pay | Attending: Hematology | Admitting: Hematology

## 2022-01-01 ENCOUNTER — Encounter: Payer: Self-pay | Admitting: Hematology

## 2022-01-01 DIAGNOSIS — K7030 Alcoholic cirrhosis of liver without ascites: Secondary | ICD-10-CM

## 2022-01-01 DIAGNOSIS — D649 Anemia, unspecified: Secondary | ICD-10-CM

## 2022-01-01 NOTE — Progress Notes (Signed)
[facility]   Telephone:([phone]) [phone] Fax:(336) [phone]   Clinic Follow up Note   Patient Care Team: [name], MD as PCP - General (Internal Medicine)  Date of Service:  01/01/2022  I connected with [name] on 01/01/2022 at  1:20 PM EDT by telephone visit and verified that I am speaking with the correct person using two identifiers.  I discussed the limitations, risks, security and privacy concerns of performing an evaluation and management service by telephone and the availability of in person appointments. I also discussed with the patient that there may be a patient responsible charge related to this service. The patient expressed understanding and agreed to proceed.   Other persons participating in the visit and their role in the encounter:  pt's friend  Patient's location:  pt's car Provider's location:  my office  CHIEF COMPLAINT: f/u of liver mass  CURRENT THERAPY:  Observation  ASSESSMENT & PLAN:  [name] is a 51 y.o. male with   1. Liver Mass -presented with coffee-ground emesis, admitted 01/20/22. CT AP showed liver cirrhosis, splenomegaly, and a 4.6 cm liver mass. Baseline AFP was WNL. -MRI showed 4.6 cm hemorrhagic mass in segment 8, difficult to characterize by LI-RADS due to lack non rim enhancement, but nonetheless concerning for [facility] until proven otherwise -liver biopsy on 12/27/21 showed predominantly necrotic tissue, no evidence of malignancy. -I reviewed the biopsy results with them today. I explained that the biopsy was negative for cancer, but will still need to follow-up the liver mass by repeating scan in 3 months.  Due to his liver cirrhosis, he is at high risk for liver cancer.  2. Liver cirrhosis, Child-Pugh score 6, class A -he has a history of heavy alcohol consumption -cirrhosis and small liver lesions were seen on CT AP back in 07/2012 (no records prior to this). -I again encouraged him to stop drinking alcohol  completely.  3. Pancytopenia, chronic -secondary to splenomegaly and alcohol abuse -Worsened anemia due to his recent GI bleeding.  He required blood transfusion -platelets were 35k on admission but dropped to 15k during admission. Recovered to 30k at time of discharge. -new anemia on 12/24/21 with hgb 9.9, dropped to 7.3 during admission, recovered to 8.9.    PLAN: -lab and f/u in 1 month for cytopenia f/u  -will forward my note to his PCP at [facility], per his request -will order repeating MRI on next visit   No problem-specific Assessment & Plan notes found for this encounter.   INTERVAL HISTORY:  [name] was contacted for a follow up of liver mass. He was last seen by me on 12/27/21 while in the hospital.  He reports he is doing well, no new complaints.   All other systems were reviewed with the patient and are negative.  MEDICAL HISTORY:  Past Medical History:  Diagnosis Date   Alcoholic ([place])    Anxiety    Diabetes ([place])    DVT (deep venous thrombosis) ([place])    Hypertension     SURGICAL HISTORY: Past Surgical History:  Procedure Laterality Date   BLOOD PATCH     ESOPHAGOGASTRODUODENOSCOPY N/A 12/26/2021   Procedure: ESOPHAGOGASTRODUODENOSCOPY (EGD);  Surgeon: Ronnette Juniper, MD;  Location: [facility];  Service: Gastroenterology;  Laterality: N/A;   TONSILLECTOMY      I have reviewed the social history and family history with the patient and they are unchanged from previous note.  ALLERGIES:  has No Known Allergies.  MEDICATIONS:  Current Outpatient Medications  Medication Sig Dispense Refill   ALPRAZolam (XANAX) 0.5 MG tablet Take 0.5 mg by mouth daily as needed for anxiety or sleep.     metFORMIN (GLUCOPHAGE) 500 MG tablet Take 1 tablet (500 mg total) by mouth 2 (two) times daily with a meal. 60 tablet 0   pantoprazole (PROTONIX) 40 MG tablet Take 1 tablet (40 mg total) by mouth 2 (two) times daily before a meal. 60 tablet  2   traZODone (DESYREL) 150 MG tablet Take 75-150 mg by mouth at bedtime.     No current facility-administered medications for this visit.    PHYSICAL EXAMINATION: ECOG PERFORMANCE STATUS: 1 - Symptomatic but completely ambulatory  There were no vitals filed for this visit. Wt Readings from Last 3 Encounters:  12/26/21 156 lb 12 oz (71.1 kg)  09/23/21 164 lb 14.5 oz (74.8 kg)  06/01/21 165 lb (74.8 kg)     No vitals taken today, Exam not performed today  LABORATORY DATA:  I have reviewed the data as listed    Latest Ref Rng & Units 12/28/2021    8:54 AM 12/27/2021    4:00 AM 12/26/2021    9:23 AM  CBC  WBC 4.0 - 10.5 K/uL 2.2  4.3  2.0   Hemoglobin 13.0 - 17.0 g/dL 8.9  8.4  7.8   Hematocrit 39.0 - 52.0 % 26.6  24.7  23.3   Platelets 150 - 400 K/uL 30  41  23         Latest Ref Rng & Units 12/28/2021    8:54 AM 12/27/2021    4:00 AM 12/26/2021    9:23 AM  CMP  Glucose 70 - 99 mg/dL 126  186  164   BUN 6 - 20 mg/dL '9  11  9   '$ Creatinine 0.61 - 1.24 mg/dL 0.68  0.54  0.68   Sodium 135 - 145 mmol/L 136  137  136   Potassium 3.5 - 5.1 mmol/L 2.8  3.7  3.3   Chloride 98 - 111 mmol/L 100  101  100   CO2 22 - 32 mmol/L '28  28  26   '$ Calcium 8.9 - 10.3 mg/dL 8.2  8.5  7.7   Total Protein 6.5 - 8.1 g/dL   5.2   Total Bilirubin 0.3 - 1.2 mg/dL   1.6   Alkaline Phos 38 - 126 U/L   36   AST 15 - 41 U/L   187   ALT 0 - 44 U/L   213       RADIOGRAPHIC STUDIES: I have personally reviewed the radiological images as listed and agreed with the findings in the report. No results found.    Orders Placed This Encounter  Procedures   CBC with Differential/Platelet    Standing Status:   Standing    Number of Occurrences:   50    Standing Expiration Date:   01/02/2023   AFP tumor marker    Standing Status:   Standing    Number of Occurrences:   20    Standing Expiration Date:   01/02/2023   Ferritin    Standing Status:   Standing    Number of Occurrences:   20    Standing  Expiration Date:   01/02/2023   Iron and TIBC    Standing Status:   Future    Standing Expiration Date:   01/01/2023   All questions were answered. The patient knows to call the  clinic with any problems, questions or concerns. No barriers to learning was detected. The total time spent in the appointment was 22 minutes.     Truitt Merle, MD 01/01/2022   I, Wilburn Mylar, am acting as scribe for Truitt Merle, MD.   I have reviewed the above documentation for accuracy and completeness, and I agree with the above.

## 2022-01-02 ENCOUNTER — Telehealth: Payer: Self-pay | Admitting: Hematology

## 2022-01-02 NOTE — Telephone Encounter (Signed)
Left patient a voicemail regarding 11/13 appointments.

## 2022-01-09 MED FILL — Fentanyl Citrate Preservative Free (PF) Inj 100 MCG/2ML: INTRAMUSCULAR | Qty: 1 | Status: AC

## 2022-01-28 ENCOUNTER — Ambulatory Visit: Payer: Self-pay | Admitting: Internal Medicine

## 2022-02-03 ENCOUNTER — Other Ambulatory Visit: Payer: Self-pay

## 2022-02-03 ENCOUNTER — Inpatient Hospital Stay: Payer: Self-pay | Attending: Hematology | Admitting: Hematology

## 2022-02-03 ENCOUNTER — Inpatient Hospital Stay: Payer: Self-pay

## 2022-02-03 DIAGNOSIS — D61818 Other pancytopenia: Secondary | ICD-10-CM | POA: Insufficient documentation

## 2022-02-04 ENCOUNTER — Telehealth: Payer: Self-pay | Admitting: Hematology

## 2022-02-04 NOTE — Telephone Encounter (Signed)
Attempted to reach patient per 11/13 in basket. Left voicemail of new upcoming appointments.

## 2022-02-06 ENCOUNTER — Ambulatory Visit: Payer: Self-pay | Admitting: Internal Medicine

## 2022-02-17 ENCOUNTER — Inpatient Hospital Stay: Payer: Self-pay

## 2022-02-17 ENCOUNTER — Inpatient Hospital Stay: Payer: Self-pay | Admitting: Hematology

## 2022-02-18 ENCOUNTER — Ambulatory Visit: Payer: Self-pay | Admitting: Internal Medicine

## 2022-02-19 ENCOUNTER — Ambulatory Visit: Payer: Self-pay | Admitting: Internal Medicine

## 2022-03-10 ENCOUNTER — Telehealth: Payer: Self-pay

## 2022-03-10 ENCOUNTER — Encounter (HOSPITAL_BASED_OUTPATIENT_CLINIC_OR_DEPARTMENT_OTHER): Payer: Self-pay | Admitting: Emergency Medicine

## 2022-03-10 ENCOUNTER — Other Ambulatory Visit: Payer: Self-pay

## 2022-03-10 ENCOUNTER — Emergency Department (HOSPITAL_BASED_OUTPATIENT_CLINIC_OR_DEPARTMENT_OTHER): Payer: Self-pay

## 2022-03-10 ENCOUNTER — Emergency Department (HOSPITAL_BASED_OUTPATIENT_CLINIC_OR_DEPARTMENT_OTHER)
Admission: EM | Admit: 2022-03-10 | Discharge: 2022-03-10 | Disposition: A | Discharge status: Home / Self Care | Payer: Self-pay | Attending: Emergency Medicine | Admitting: Emergency Medicine

## 2022-03-10 DIAGNOSIS — F10930 Alcohol use, unspecified with withdrawal, uncomplicated: Secondary | ICD-10-CM | POA: Insufficient documentation

## 2022-03-10 DIAGNOSIS — K7030 Alcoholic cirrhosis of liver without ascites: Secondary | ICD-10-CM | POA: Insufficient documentation

## 2022-03-10 DIAGNOSIS — D696 Thrombocytopenia, unspecified: Secondary | ICD-10-CM | POA: Insufficient documentation

## 2022-03-10 DIAGNOSIS — I10 Essential (primary) hypertension: Secondary | ICD-10-CM | POA: Insufficient documentation

## 2022-03-10 DIAGNOSIS — Z7984 Long term (current) use of oral hypoglycemic drugs: Secondary | ICD-10-CM | POA: Insufficient documentation

## 2022-03-10 DIAGNOSIS — E119 Type 2 diabetes mellitus without complications: Secondary | ICD-10-CM | POA: Insufficient documentation

## 2022-03-10 LAB — COMPREHENSIVE METABOLIC PANEL
ALT: 147 U/L — ABNORMAL HIGH (ref 0–44)
AST: 207 U/L — ABNORMAL HIGH (ref 15–41)
Albumin: 4.2 g/dL (ref 3.5–5.0)
Alkaline Phosphatase: 108 U/L (ref 38–126)
Anion gap: 15 (ref 5–15)
BUN: <5 mg/dL — ABNORMAL LOW (ref 6–20)
CO2: 23 mmol/L (ref 22–32)
Calcium: 8.6 mg/dL — ABNORMAL LOW (ref 8.9–10.3)
Chloride: 102 mmol/L (ref 98–111)
Creatinine, Ser: 0.53 mg/dL — ABNORMAL LOW (ref 0.61–1.24)
GFR, Estimated: >60 mL/min (ref >60)
Glucose, Bld: 205 mg/dL — ABNORMAL HIGH (ref 70–99)
Potassium: 3.7 mmol/L (ref 3.5–5.1)
Sodium: 140 mmol/L (ref 135–145)
Total Bilirubin: 2 mg/dL — ABNORMAL HIGH (ref 0.3–1.2)
Total Protein: 7.5 g/dL (ref 6.5–8.1)

## 2022-03-10 LAB — CBC WITH DIFFERENTIAL/PLATELET
Abs Immature Granulocytes: 0.03 10*3/uL (ref 0.00–0.07)
Basophils Absolute: 0 10*3/uL (ref 0.0–0.1)
Basophils Relative: 2 %
Eosinophils Absolute: 0.1 10*3/uL (ref 0.0–0.5)
Eosinophils Relative: 3 %
HCT: 40.1 % (ref 39.0–52.0)
Hemoglobin: 12.3 g/dL — ABNORMAL LOW (ref 13.0–17.0)
Immature Granulocytes: 2 %
Lymphocytes Relative: 32 %
Lymphs Abs: 0.6 10*3/uL — ABNORMAL LOW (ref 0.7–4.0)
MCH: 24.2 pg — ABNORMAL LOW (ref 26.0–34.0)
MCHC: 30.7 g/dL (ref 30.0–36.0)
MCV: 78.8 fL — ABNORMAL LOW (ref 80.0–100.0)
Monocytes Absolute: 0.2 10*3/uL (ref 0.1–1.0)
Monocytes Relative: 8 %
Neutro Abs: 1 10*3/uL — ABNORMAL LOW (ref 1.7–7.7)
Neutrophils Relative %: 53 %
Platelets: 12 10*3/uL — CL (ref 150–400)
RBC: 5.09 MIL/uL (ref 4.22–5.81)
RDW: 18.6 % — ABNORMAL HIGH (ref 11.5–15.5)
WBC: 1.9 10*3/uL — ABNORMAL LOW (ref 4.0–10.5)
nRBC: 0 % (ref 0.0–0.2)

## 2022-03-10 LAB — MAGNESIUM: Magnesium: 1.9 mg/dL (ref 1.7–2.4)

## 2022-03-10 LAB — D-DIMER, QUANTITATIVE: D-Dimer, Quant: 1.31 ug/mL-FEU — ABNORMAL HIGH (ref 0.00–0.50)

## 2022-03-10 MED ORDER — THIAMINE HCL 100 MG/ML IJ SOLN
100.0000 mg | Freq: Once | INTRAMUSCULAR | Status: AC
  Administered 2022-03-10: 100 mg via INTRAVENOUS
  Filled 2022-03-10: qty 2

## 2022-03-10 MED ORDER — SODIUM CHLORIDE 0.9 % IV BOLUS
1500.0000 mL | Freq: Once | INTRAVENOUS | Status: AC
  Administered 2022-03-10: 1500 mL via INTRAVENOUS

## 2022-03-10 MED ORDER — FOLIC ACID 1 MG PO TABS
1.0000 mg | ORAL_TABLET | Freq: Once | ORAL | Status: AC
  Administered 2022-03-10: 1 mg via ORAL
  Filled 2022-03-10: qty 1

## 2022-03-10 MED ORDER — SODIUM CHLORIDE 0.9 % IV BOLUS
1000.0000 mL | Freq: Once | INTRAVENOUS | Status: AC
  Administered 2022-03-10: 1000 mL via INTRAVENOUS

## 2022-03-10 MED ORDER — IOHEXOL 350 MG/ML SOLN
80.0000 mL | Freq: Once | INTRAVENOUS | Status: AC | PRN
  Administered 2022-03-10: 80 mL via INTRAVENOUS

## 2022-03-10 MED ORDER — CHLORDIAZEPOXIDE HCL 25 MG PO CAPS: ORAL_CAPSULE | ORAL | 0 refills | Status: DC

## 2022-03-10 MED ORDER — PHENOBARBITAL SODIUM 65 MG/ML IJ SOLN
260.0000 mg | Freq: Once | INTRAMUSCULAR | Status: AC
  Administered 2022-03-10: 260 mg via INTRAVENOUS
  Filled 2022-03-10: qty 4

## 2022-03-10 NOTE — ED Notes (Signed)
Patient transported to CT 

## 2022-03-10 NOTE — Discharge Instructions (Addendum)
We evaluated you for your your symptoms.  Your symptoms are likely due to alcohol withdrawal.  We have prescribed you Librium to take at home.  Your platelet count is also very low.  We discussed this with your hematologist Dr. Annamaria Boots who would like to see you in her clinic in 2 to 3 days for recheck.  Please return to the emergency department if you have any worsening symptoms, abdominal pain, blood in your vomit, bloody stools or black stools, nosebleeds, unexplained bleeding, or any other concerning symptoms.

## 2022-03-10 NOTE — ED Provider Notes (Signed)
[facility] EMERGENCY DEPARTMENT Provider Note  CSN: [id number] Arrival date & time: 03/10/22 7846  Chief Complaint(s) Detox from Alcohol  HPI [name] is a 51 y.o. male with history of alcohol use, diabetes presenting to the emergency department with request for detox.  Patient reports that he last drink alcohol earlier today.  He wants to stop drinking.  Denies suicidal or homicidal ideations.  No nausea, vomiting.  No chest pain or abdominal pain.  No fevers or chills.  Symptoms moderate.   Past Medical History Past Medical History:  Diagnosis Date   Alcoholic ([place])    Anxiety    Diabetes ([place])    DVT (deep venous thrombosis) ([place])    Hypertension    Patient Active Problem List   Diagnosis Date Noted   Other pancytopenia ([place]) 02/03/2022   Liver mass    Low TSH level 12/25/2021   GIB (gastrointestinal bleeding) 12/24/2021   DM (diabetes mellitus), type 2 ([place]) 12/24/2021   Alcohol withdrawal ([place]) 12/12/2020   Substance induced mood disorder ([place]) 12/09/2020   Hypokalemia 08/15/2012   Hypomagnesemia 08/15/2012   Other and unspecified hyperlipidemia 08/15/2012   Encephalopathy acute 08/09/2012   Alcohol withdrawal with delirium ([place]) 08/08/2012   Portal hypertension ([place]) 08/08/2012   Splenomegaly 96/29/5284   Alcoholic hepatitis without ascites 08/08/2012   Hiatal hernia 08/08/2012   Alcohol abuse 08/06/2012   Elevated blood alcohol level 08/06/2012   Acute pancreatitis 08/06/2012   Cirrhosis of liver ([place]) 08/06/2012   Thrombocytopenia ([place]) 08/06/2012   Hypertension    Anxiety    DVT (deep venous thrombosis) ([place])    Home Medication(s) Prior to Admission medications   Medication Sig Start Date End Date Taking? Authorizing Provider  chlordiazePOXIDE (LIBRIUM) 25 MG capsule '50mg'$  PO TID x 1D, then 25-'50mg'$  PO BID X 1D, then 25-'50mg'$  PO QD X 1D 03/10/22  Yes Cristie Hem, MD  metFORMIN (GLUCOPHAGE) 500 MG tablet Take 1 tablet (500 mg  total) by mouth 2 (two) times daily with a meal. 09/23/21   Dykstra, Ellwood Dense, MD  pantoprazole (PROTONIX) 40 MG tablet Take 1 tablet (40 mg total) by mouth 2 (two) times daily before a meal. 12/28/21 03/28/22  Dahal, Marlowe Aschoff, MD  traZODone (DESYREL) 150 MG tablet Take 75-150 mg by mouth at bedtime. 12/03/21   [provider]                                                                                                                                    Past Surgical History Past Surgical History:  Procedure Laterality Date   BLOOD PATCH     ESOPHAGOGASTRODUODENOSCOPY N/A 12/26/2021   Procedure: ESOPHAGOGASTRODUODENOSCOPY (EGD);  Surgeon: Ronnette Juniper, MD;  Location: [facility];  Service: Gastroenterology;  Laterality: N/A;   TONSILLECTOMY     Family History Family History  Problem Relation Age of Onset   Hypertension Father     Social History  Social History   Tobacco Use   Smoking status: Never   Smokeless tobacco: Never  Vaping Use   Vaping Use: Never used  Substance Use Topics   Alcohol use: Yes    Alcohol/week: 12.0 standard drinks of alcohol    Types: 12 Cans of beer per week    Comment: Beer/Hourly   Drug use: No    Comment: marijuana in the past   Allergies Patient has no known allergies.  Review of Systems Review of Systems  All other systems reviewed and are negative.   Physical Exam Vital Signs  I have reviewed the triage vital signs BP (!) 141/86   Pulse (!) 109   Temp 98.5 F (36.9 C)   Resp 20   Ht '5\' 7"'$  (1.702 m)   Wt 72 kg   SpO2 93%   BMI 24.86 kg/m  Physical Exam Vitals and nursing note reviewed.  Constitutional:      General: He is not in acute distress.    Appearance: Normal appearance.  HENT:     Mouth/Throat:     Mouth: Mucous membranes are dry.  Eyes:     Conjunctiva/sclera: Conjunctivae normal.  Cardiovascular:     Rate and Rhythm: Regular rhythm. Tachycardia present.  Pulmonary:     Effort: Pulmonary effort is normal. No  respiratory distress.     Breath sounds: Normal breath sounds.  Abdominal:     General: Abdomen is flat.     Palpations: Abdomen is soft.     Tenderness: There is no abdominal tenderness.  Musculoskeletal:     Right lower leg: No edema.     Left lower leg: No edema.  Skin:    General: Skin is warm and dry.     Capillary Refill: Capillary refill takes less than 2 seconds.  Neurological:     Mental Status: He is alert and oriented to person, place, and time. Mental status is at baseline.  Psychiatric:        Mood and Affect: Mood normal.        Behavior: Behavior normal.     ED Results and Treatments Labs (all labs ordered are listed, but only abnormal results are displayed) Labs Reviewed  CBC WITH DIFFERENTIAL/PLATELET - Abnormal; Notable for the following components:      Result Value   WBC 1.9 (*)    Hemoglobin 12.3 (*)    MCV 78.8 (*)    MCH 24.2 (*)    RDW 18.6 (*)    Platelets 12 (*)    Neutro Abs 1.0 (*)    Lymphs Abs 0.6 (*)    All other components within normal limits  COMPREHENSIVE METABOLIC PANEL - Abnormal; Notable for the following components:   Glucose, Bld 205 (*)    BUN <5 (*)    Creatinine, Ser 0.53 (*)    Calcium 8.6 (*)    AST 207 (*)    ALT 147 (*)    Total Bilirubin 2.0 (*)    All other components within normal limits  D-DIMER, QUANTITATIVE - Abnormal; Notable for the following components:   D-Dimer, Quant 1.31 (*)    All other components within normal limits  MAGNESIUM  Radiology CT Angio Chest PE W/Cm &/Or Wo Cm  Result Date: 03/10/2022 CLINICAL DATA:  Suspect pulmonary is in. Positive D-dimer. ETOH abuse. EXAM: CT ANGIOGRAPHY CHEST WITH CONTRAST TECHNIQUE: Multidetector CT imaging of the chest was performed using the standard protocol during bolus administration of intravenous contrast. Multiplanar CT image reconstructions  and MIPs were obtained to evaluate the vascular anatomy. RADIATION DOSE REDUCTION: This exam was performed according to the departmental dose-optimization program which includes automated exposure control, adjustment of the mA and/or kV according to patient size and/or use of iterative reconstruction technique. CONTRAST:  30m OMNIPAQUE IOHEXOL 350 MG/ML SOLN COMPARISON:  None Available. FINDINGS: Cardiovascular: Mild cardiomegaly. Thoracic aorta is normal caliber. Pulmonary arterial system demonstrates no evidence of emboli. Remaining vascular structures are unremarkable. Mediastinum/Nodes: No evidence of mediastinal or hilar adenopathy. Moderate size hiatal hernia. Remaining mediastinal structures are unremarkable. Lungs/Pleura: Lungs are adequately inflated without focal airspace consolidation or effusion. Airways are normal. Upper Abdomen: Diffuse low-attenuation of the liver compatible with a degree of steatosis/cirrhosis. Nodularity to the liver contour. 1 cm indistinct hyperdensity over the lateral segment left lobe of the liver. There is a mass over the anterior segment right lobe of the liver peripherally measuring 4 x 4.9 cm with mild central low attenuation. Evidence of splenomegaly. Mild calcified plaque over the abdominal aorta. Musculoskeletal: No focal abnormality. Review of the MIP images confirms the above findings. IMPRESSION: 1. No acute cardiopulmonary disease and no evidence of pulmonary emboli. 2. Moderate size hiatal hernia. 3. Evidence of cirrhosis with associated splenomegaly. 4 x 4.9 cm mass over the anterior segment right lobe of the liver concerning for underlying hepatocellular carcinoma or possible metastatic lesion. 1 cm indistinct hyperdensity over the lateral segment left lobe of the liver. Recommend further evaluation with MRI on an elective basis. 4. Aortic atherosclerosis. Aortic Atherosclerosis (ICD10-I70.0). Electronically Signed   By: DMarin OlpM.D.   On: 03/10/2022 12:25    CT Head Wo Contrast  Result Date: 03/10/2022 CLINICAL DATA:  Head trauma, coagulopathy (Age 53-64y EXAM: CT HEAD WITHOUT CONTRAST TECHNIQUE: Contiguous axial images were obtained from the base of the skull through the vertex without intravenous contrast. RADIATION DOSE REDUCTION: This exam was performed according to the departmental dose-optimization program which includes automated exposure control, adjustment of the mA and/or kV according to patient size and/or use of iterative reconstruction technique. COMPARISON:  05/09/2013. FINDINGS: Brain: There is periventricular white matter decreased attenuation consistent with small vessel ischemic changes. Ventricles, sulci and cisterns are prominent consistent with age involutional changes that are advanced for age. No acute intracranial hemorrhage, mass effect or shift. No hydrocephalus. Vascular: No hyperdense vessel or unexpected calcification. Skull: Normal. Negative for fracture or focal lesion. Sinuses/Orbits: No acute finding. IMPRESSION: Small vessel ischemic changes. Atrophy advanced for age. No acute intracranial process identified. Electronically Signed   By: JSammie BenchM.D.   On: 03/10/2022 10:03    Pertinent labs & imaging results that were available during my care of the patient were reviewed by me and considered in my medical decision making (see MDM for details).  Medications Ordered in ED Medications  sodium chloride 0.9 % bolus 1,500 mL (0 mLs Intravenous Stopped 03/10/22 0945)  sodium chloride 0.9 % bolus 1,000 mL (0 mLs Intravenous Stopped 03/10/22 1211)  thiamine (VITAMIN B1) injection 100 mg (100 mg Intravenous Given 140/34/7412595  folic acid (FOLVITE) tablet 1 mg (1 mg Oral Given 03/10/22 1053)  iohexol (OMNIPAQUE) 350 MG/ML injection 80 mL (80 mLs Intravenous Contrast  Given 03/10/22 1203)  PHENObarbital (LUMINAL) injection 260 mg (260 mg Intravenous Given 03/10/22 1307)  PHENObarbital (LUMINAL) injection 260 mg (260 mg  Intravenous Given 03/10/22 1436)                                                                                                                                     Procedures .1-3 Lead EKG Interpretation  Performed by: Cristie Hem, MD Authorized by: Cristie Hem, MD     Interpretation: abnormal     ECG rate:  109   ECG rate assessment: tachycardic     Rhythm: sinus tachycardia     Ectopy: none     Conduction: normal   .Critical Care  Performed by: Cristie Hem, MD Authorized by: Cristie Hem, MD   Critical care provider statement:    Critical care time (minutes):  30   Critical care was necessary to treat or prevent imminent or life-threatening deterioration of the following conditions:  Toxidrome and dehydration   Critical care was time spent personally by me on the following activities:  Development of treatment plan with patient or surrogate, discussions with consultants, evaluation of patient's response to treatment, examination of patient, ordering and review of laboratory studies, ordering and review of radiographic studies, ordering and performing treatments and interventions, pulse oximetry, re-evaluation of patient's condition and review of old charts   (including critical care time)  Medical Decision Making / ED Course   MDM:  51 year old male presenting to the emergency department with request for detox.  Vital signs notable for mild tachycardia.  Patient appears dehydrated.  Patient appears mildly intoxicated.  Will obtain basic laboratory testing given signs of dehydration and alcohol abuse to evaluate for metabolic abnormality or electrolyte abnormality.  Will give IV fluids.  Will monitor for improvement in sobriety.  Once patient clinically sober if not having withdrawals likely discharge to home.  Patient requests Librium taper, reports this has helped in the past.  Likely discharge with this and resources.  Clinical Course as of  03/10/22 1507  Mon Mar 10, 2022  1131 Patient persistently, denies any symptoms.  Also with slightly low oxygen.  Will obtain D-dimer. [WS]  1231 CTA chest negative. [WS]  1341 Heart rate improved after phenobarbital.  Will monitor.  Will discuss thrombocytopenia with hematology but anticipate discharge. [WS]  1504 Discussed with thrombocytopenia with Dr. Burr Medico who is the patient's previous hematologist/oncologist he was inpatient.  She does not think he needs a platelet transfusion now.  Recommends that he follow-up with her in next few days.  Her office will help make appointment.  Patient does have mild ongoing tachycardia but clinically sober, denies ongoing symptoms of alcohol withdrawal after second dose of phenobarbital. Will discharge patient to home. All questions answered. Patient comfortable with plan of discharge. Return precautions discussed with patient and specified on the after visit summary.  [WS]    Clinical  Course User Index [WS] Cristie Hem, MD     Additional history obtained: -Additional history obtained from friend -External records from outside source obtained and reviewed including: Chart review including previous notes, labs, imaging, consultation notes including DC summary from recent hospitalization     Lab Tests: -I ordered, reviewed, and interpreted labs.   The pertinent results include:   Labs Reviewed  CBC WITH DIFFERENTIAL/PLATELET - Abnormal; Notable for the following components:      Result Value   WBC 1.9 (*)    Hemoglobin 12.3 (*)    MCV 78.8 (*)    MCH 24.2 (*)    RDW 18.6 (*)    Platelets 12 (*)    Neutro Abs 1.0 (*)    Lymphs Abs 0.6 (*)    All other components within normal limits  COMPREHENSIVE METABOLIC PANEL - Abnormal; Notable for the following components:   Glucose, Bld 205 (*)    BUN <5 (*)    Creatinine, Ser 0.53 (*)    Calcium 8.6 (*)    AST 207 (*)    ALT 147 (*)    Total Bilirubin 2.0 (*)    All other components within  normal limits  D-DIMER, QUANTITATIVE - Abnormal; Notable for the following components:   D-Dimer, Quant 1.31 (*)    All other components within normal limits  MAGNESIUM    Notable for elevated D-dimer, mild hyperglycemia, abnormal LFTs likely due to alcohol use, thrombocytopenia which is somewhat chronic  EKG   EKG Interpretation  Date/Time:  Monday March 10 2022 06:58:25 EST Ventricular Rate:  109 PR Interval:  158 QRS Duration: 102 QT Interval:  346 QTC Calculation: 466 R Axis:   25 Text Interpretation: Sinus tachycardia Abnormal R-wave progression, early transition Previously frequent PVCs Confirmed by Molpus, John [phone]) on 03/10/2022 7:00:46 AM         Imaging Studies ordered: I ordered imaging studies including CTA chest On my interpretation imaging demonstrates no PE I independently visualized and interpreted imaging. I agree with the radiologist interpretation   Medicines ordered and prescription drug management: Meds ordered this encounter  Medications   sodium chloride 0.9 % bolus 1,500 mL   sodium chloride 0.9 % bolus 1,000 mL   thiamine (VITAMIN B1) injection 132 mg   folic acid (FOLVITE) tablet 1 mg   iohexol (OMNIPAQUE) 350 MG/ML injection 80 mL   PHENObarbital (LUMINAL) injection 260 mg   PHENObarbital (LUMINAL) injection 260 mg   chlordiazePOXIDE (LIBRIUM) 25 MG capsule    Sig: '50mg'$  PO TID x 1D, then 25-'50mg'$  PO BID X 1D, then 25-'50mg'$  PO QD X 1D    Dispense:  10 capsule    Refill:  0    -I have reviewed the patients home medicines and have made adjustments as needed   Consultations Obtained: I requested consultation with the hematologist,  and discussed lab and imaging findings as well as pertinent plan - they recommend: outpatient follow up   Cardiac Monitoring: The patient was maintained on a cardiac monitor.  I personally viewed and interpreted the cardiac monitored which showed an underlying rhythm of: sinus tachycardia  Social  Determinants of Health:  Diagnosis or treatment significantly limited by social determinants of health: alcohol use   Reevaluation: After the interventions noted above, I reevaluated the patient and found that they have improved  Co morbidities that complicate the patient evaluation  Past Medical History:  Diagnosis Date   Alcoholic ([place])    Anxiety    Diabetes (  [place])    DVT (deep venous thrombosis) ([place])    Hypertension       Dispostion: Disposition decision including need for hospitalization was considered, and patient discharged from emergency department.    Final Clinical Impression(s) / ED Diagnoses Final diagnoses:  Alcohol withdrawal syndrome without complication (HCC)  Thrombocytopenia ([facility])  Alcoholic cirrhosis of liver without ascites ([place])     This chart was dictated using voice recognition software.  Despite best efforts to proofread,  errors can occur which can change the documentation meaning.    Cristie Hem, MD 03/10/22 [phone]

## 2022-03-10 NOTE — ED Notes (Signed)
ED Provider at bedside. 

## 2022-03-10 NOTE — ED Notes (Signed)
When rounding on pt this nurse noted a petchiae rash noted on patients left arm and hand. EDP aware and called to bedside

## 2022-03-10 NOTE — Telephone Encounter (Signed)
Pt currently in the ED at [facility] for chronic low plt level (11 plts).  ER provider Dr. Garnette Gunner stated he contacted Dr. Chryl Heck regarding pt's plts and if the pt is "OK" to be d/c or does the pt need plts.  Dr. Truett Mainland stated the pt is not actively bleeding but needs to know if there is something else Dr. Burr Medico would like done before d/c pt home.  Dr. Truett Mainland requested a return call at [phone].  Secure chat message sent to Dr. Burr Medico as well.

## 2022-03-10 NOTE — ED Triage Notes (Signed)
Pt states that he wants detox from alcohol. Last drink was an hour ago. States he has had a lot in the last 24 hours.

## 2022-03-11 ENCOUNTER — Telehealth: Payer: Self-pay | Admitting: Nurse Practitioner

## 2022-03-11 NOTE — Telephone Encounter (Signed)
Per 12/19 IB Message left

## 2022-03-12 NOTE — Progress Notes (Deleted)
[facility]   [phone] [phone]) [phone]   Clinic Follow up Note   Patient Care Team: [name], MD as PCP - General (Internal Medicine) 03/12/2022  CHIEF COMPLAINT: Follow-up pancytopenia and known liver mass  CURRENT THERAPY: Observation  INTERVAL HISTORY: [name] returns for follow-up.  Last seen by Dr. Burr Medico 01/01/2022.  He presented to ED 03/10/2022 for alcohol detox.  Labs showed WBC 1.9, ANC 1.0, Hgb 12.3, and platelets 12, and transaminitis.  D-dimer was elevated, CTA negative for PE but showed a 4 x 4.9 cm mass over the right lobe liver and a 1 cm hyperdensity over the lateral segment left lobe liver.  CT head showed small vessel ischemic change and age advanced atrophy.  Was discharged with close follow-up with [place]   REVIEW OF SYSTEMS:   Constitutional: Denies fevers, chills or abnormal weight loss Eyes: Denies blurriness of vision Ears, nose, mouth, throat, and face: Denies mucositis or sore throat Respiratory: Denies cough, dyspnea or wheezes Cardiovascular: Denies palpitation, chest discomfort or lower extremity swelling Gastrointestinal:  Denies nausea, heartburn or change in bowel habits Skin: Denies abnormal skin rashes Lymphatics: Denies new lymphadenopathy or easy bruising Neurological:Denies numbness, tingling or new weaknesses Behavioral/Psych: Mood is stable, no new changes  All other systems were reviewed with the patient and are negative.  MEDICAL HISTORY:  Past Medical History:  Diagnosis Date   Alcoholic ([place])    Anxiety    Diabetes ([place])    DVT (deep venous thrombosis) ([place])    Hypertension     SURGICAL HISTORY: Past Surgical History:  Procedure Laterality Date   BLOOD PATCH     ESOPHAGOGASTRODUODENOSCOPY N/A 12/26/2021   Procedure: ESOPHAGOGASTRODUODENOSCOPY (EGD);  Surgeon: Ronnette Juniper, MD;  Location: [facility];  Service: Gastroenterology;  Laterality: N/A;   TONSILLECTOMY      I have reviewed the social  history and family history with the patient and they are unchanged from previous note.  ALLERGIES:  has No Known Allergies.  MEDICATIONS:  Current Outpatient Medications  Medication Sig Dispense Refill   chlordiazePOXIDE (LIBRIUM) 25 MG capsule '50mg'$  PO TID x 1D, then 25-'50mg'$  PO BID X 1D, then 25-'50mg'$  PO QD X 1D 10 capsule 0   metFORMIN (GLUCOPHAGE) 500 MG tablet Take 1 tablet (500 mg total) by mouth 2 (two) times daily with a meal. 60 tablet 0   pantoprazole (PROTONIX) 40 MG tablet Take 1 tablet (40 mg total) by mouth 2 (two) times daily before a meal. 60 tablet 2   traZODone (DESYREL) 150 MG tablet Take 75-150 mg by mouth at bedtime.     No current facility-administered medications for this visit.    PHYSICAL EXAMINATION: ECOG PERFORMANCE STATUS: {CHL ONC ECOG PS:[phone]}  There were no vitals filed for this visit. There were no vitals filed for this visit.  GENERAL:alert, no distress and comfortable SKIN: skin color, texture, turgor are normal, no rashes or significant lesions EYES: normal, Conjunctiva are pink and non-injected, sclera clear OROPHARYNX:no exudate, no erythema and lips, buccal mucosa, and tongue normal  NECK: supple, thyroid normal size, non-tender, without nodularity LYMPH:  no palpable lymphadenopathy in the cervical, axillary or inguinal LUNGS: clear to auscultation and percussion with normal breathing effort HEART: regular rate & rhythm and no murmurs and no lower extremity edema ABDOMEN:abdomen soft, non-tender and normal bowel sounds Musculoskeletal:no cyanosis of digits and no clubbing  NEURO: alert & oriented x 3 with fluent speech, no focal motor/sensory deficits  LABORATORY DATA:  I have reviewed  the data as listed    Latest Ref Rng & Units 03/10/2022    7:36 AM 12/28/2021    8:54 AM 12/27/2021    4:00 AM  CBC  WBC 4.0 - 10.5 K/uL 1.9  2.2  4.3   Hemoglobin 13.0 - 17.0 g/dL 12.3  8.9  8.4   Hematocrit 39.0 - 52.0 % 40.1  26.6  24.7   Platelets  150 - 400 K/uL 12  30  41         Latest Ref Rng & Units 03/10/2022    7:36 AM 12/28/2021    8:54 AM 12/27/2021    4:00 AM  CMP  Glucose 70 - 99 mg/dL 205  126  186   BUN 6 - 20 mg/dL '5  9  11   '$ Creatinine 0.61 - 1.24 mg/dL 0.53  0.68  0.54   Sodium 135 - 145 mmol/L 140  136  137   Potassium 3.5 - 5.1 mmol/L 3.7  2.8  3.7   Chloride 98 - 111 mmol/L 102  100  101   CO2 22 - 32 mmol/L '23  28  28   '$ Calcium 8.9 - 10.3 mg/dL 8.6  8.2  8.5   Total Protein 6.5 - 8.1 g/dL 7.5     Total Bilirubin 0.3 - 1.2 mg/dL 2.0     Alkaline Phos 38 - 126 U/L 108     AST 15 - 41 U/L 207     ALT 0 - 44 U/L 147         RADIOGRAPHIC STUDIES: I have personally reviewed the radiological images as listed and agreed with the findings in the report. No results found.   ASSESSMENT & PLAN:  No problem-specific Assessment & Plan notes found for this encounter.   No orders of the defined types were placed in this encounter.  All questions were answered. The patient knows to call the clinic with any problems, questions or concerns. No barriers to learning was detected. I spent {CHL ONC TIME VISIT - SAYTK:[id number]} counseling the patient face to face. The total time spent in the appointment was {CHL ONC TIME VISIT - TDDUK:[id number]} and more than 50% was on counseling and review of test results     Alla Feeling, NP 03/12/22

## 2022-03-13 ENCOUNTER — Ambulatory Visit: Payer: Self-pay

## 2022-03-13 ENCOUNTER — Other Ambulatory Visit: Payer: Self-pay

## 2022-03-13 ENCOUNTER — Ambulatory Visit: Payer: Self-pay | Admitting: Nurse Practitioner

## 2022-03-13 ENCOUNTER — Telehealth: Payer: Self-pay

## 2022-03-13 NOTE — Telephone Encounter (Signed)
LVM regarding appts scheduled for today 03/13/2022.  Requested pt to give Dr. Burr Medico & Cira Rue, NP office a call.

## 2022-04-03 ENCOUNTER — Encounter: Payer: Self-pay | Admitting: Internal Medicine

## 2022-04-03 ENCOUNTER — Ambulatory Visit: Payer: 59 | Admitting: Internal Medicine

## 2022-04-03 VITALS — BP 138/80 | HR 87 | Temp 98.1°F | Resp 18 | Ht 67.0 in | Wt 162.0 lb

## 2022-04-03 DIAGNOSIS — F5104 Psychophysiologic insomnia: Secondary | ICD-10-CM

## 2022-04-03 DIAGNOSIS — D61818 Other pancytopenia: Secondary | ICD-10-CM

## 2022-04-03 DIAGNOSIS — R69 Illness, unspecified: Secondary | ICD-10-CM | POA: Diagnosis not present

## 2022-04-03 DIAGNOSIS — N401 Enlarged prostate with lower urinary tract symptoms: Secondary | ICD-10-CM

## 2022-04-03 DIAGNOSIS — R3916 Straining to void: Secondary | ICD-10-CM

## 2022-04-03 DIAGNOSIS — K7010 Alcoholic hepatitis without ascites: Secondary | ICD-10-CM

## 2022-04-03 DIAGNOSIS — K7030 Alcoholic cirrhosis of liver without ascites: Secondary | ICD-10-CM | POA: Diagnosis not present

## 2022-04-03 DIAGNOSIS — R160 Hepatomegaly, not elsewhere classified: Secondary | ICD-10-CM

## 2022-04-03 DIAGNOSIS — E119 Type 2 diabetes mellitus without complications: Secondary | ICD-10-CM | POA: Diagnosis not present

## 2022-04-03 MED ORDER — TAMSULOSIN HCL 0.4 MG PO CAPS: 0.4000 mg | ORAL_CAPSULE | Freq: Every day | ORAL | 4 refills | Status: DC

## 2022-04-03 MED ORDER — FEXOFENADINE-PSEUDOEPHED ER 60-120 MG PO TB12: 1.0000 | ORAL_TABLET | Freq: Two times a day (BID) | ORAL | 0 refills | Status: DC

## 2022-04-03 MED ORDER — MIRTAZAPINE 15 MG PO TABS: 15.0000 mg | ORAL_TABLET | Freq: Every day | ORAL | 2 refills | Status: DC

## 2022-04-03 NOTE — Addendum Note (Signed)
Addended byGarwin Brothers on: 04/03/2022 11:23 AM   Modules accepted: Orders

## 2022-04-03 NOTE — Assessment & Plan Note (Signed)
That he could drinking 3 weeks ago.  He need to follow-up with GI/hepatology as he has insurance now.

## 2022-04-03 NOTE — Assessment & Plan Note (Signed)
He will need to follow-up with oncology and hepatology.

## 2022-04-03 NOTE — Assessment & Plan Note (Signed)
He says that mirtazapine works better for him than trazodone, he also has anxiety so I will stop trazodone and continue with the mirtazapine 15 mg at bedtime.

## 2022-04-03 NOTE — Assessment & Plan Note (Signed)
ASA and start him on Flomax 0.4 mg daily.

## 2022-04-03 NOTE — Assessment & Plan Note (Signed)
I will do hemoglobin A1c today.

## 2022-04-03 NOTE — Addendum Note (Signed)
Addended byGarwin Brothers on: 04/03/2022 11:11 AM   Modules accepted: Orders

## 2022-04-03 NOTE — Assessment & Plan Note (Signed)
I will refer him to hematologist at cancer center for pancytopenia.

## 2022-04-03 NOTE — Progress Notes (Addendum)
Office Visit  Subjective   Patient ID: [name]   DOB: 07-Dec-1970   Age: 52 y.o.   MRN: [id number]   Chief Complaint Chief Complaint  Patient presents with   Acute Visit     History of Present Illness 52 years old male is here for follow up.  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  He he was seen in the in the emergency room on December 18 for detox.  He was discharged home on Librium.  He says that since that time he has quit drinking alcohol.  His platelet count was very low around 9 and WBC was 1.2.  He was transfused 2 bags of platelet and he was referred to see a hematologist but he has not made an appointment yet.  He also has a liver mass suspicious for hepatocellular carcinoma but patient says that he has a biopsy done few months ago and they told him that there was no cancer. He is complaining of runny nose for 2 weeks, he says that his wife and his son both have the same problem.  He is bringing clear secretion through nose.  He wanted his ear to be checked.  No wheezing, no fever or chills and no body ache. He also has diabetes mellitus and takes metformin twice a day.  I do not have his recent hemoglobin A1c.  His blood sugar was high when he was in the hospital emergency room. He also is complaining of difficulty in urination.  He says that he struggles to pee and dribble many times.  Past Medical History Past Medical History:  Diagnosis Date   Alcoholic ([place])    Anxiety    Diabetes ([place])    DVT (deep venous thrombosis) ([place])    Hypertension      Allergies No Known Allergies   Review of Systems Review of Systems  Constitutional: Negative.   HENT:  Positive for congestion.   Respiratory: Negative.    Cardiovascular: Negative.   Gastrointestinal: Negative.  Negative for blood in stool and melena.  Genitourinary:  Positive for dysuria.       Objective:    Vitals BP 138/80 (BP Location: Left Arm, Patient Position: Sitting,  Cuff Size: Normal)   Pulse 87   Temp 98.1 F (36.7 C)   Resp 18   Ht '5\' 7"'$  (1.702 m)   Wt 162 lb (73.5 kg)   SpO2 97%   BMI 25.37 kg/m    Physical Examination Physical Exam Constitutional:      Appearance: Normal appearance.  HENT:     Head: Normocephalic and atraumatic.  Cardiovascular:     Rate and Rhythm: Normal rate and regular rhythm.     Heart sounds: Normal heart sounds.  Pulmonary:     Effort: Pulmonary effort is normal.     Breath sounds: Normal breath sounds.  Abdominal:     General: Bowel sounds are normal.     Palpations: Abdomen is soft.  Neurological:     General: No focal deficit present.     Mental Status: He is alert and oriented to person, place, and time.        Assessment & Plan:   Other pancytopenia ([place]) I will refer him to hematologist at cancer center for pancytopenia.  Alcoholic hepatitis without ascites That he could drinking 3 weeks ago.  He need to follow-up with GI/hepatology as he has insurance now.  DM (diabetes mellitus), type 2 ([place]) I will  do hemoglobin A1c today.  Liver mass He will need to follow-up with oncology and hepatology.  Psychophysiological insomnia He says that mirtazapine works better for him than trazodone, he also has anxiety so I will stop trazodone and continue with the mirtazapine 15 mg at bedtime.  Benign prostatic hyperplasia (BPH) with straining on urination ASA and start him on Flomax 0.4 mg daily.    No follow-ups on file.   [name], MD

## 2022-04-04 LAB — CBC WITH DIFFERENTIAL/PLATELET
Basophils Absolute: 0 10*3/uL (ref 0.0–0.2)
Basos: 1 %
EOS (ABSOLUTE): 0.1 10*3/uL (ref 0.0–0.4)
Eos: 2 %
Hematocrit: 43 % (ref 37.5–51.0)
Hemoglobin: 13.6 g/dL (ref 13.0–17.7)
Immature Grans (Abs): 0 10*3/uL (ref 0.0–0.1)
Immature Granulocytes: 0 %
Lymphocytes Absolute: 0.4 10*3/uL — ABNORMAL LOW (ref 0.7–3.1)
Lymphs: 14 %
MCH: 25.4 pg — ABNORMAL LOW (ref 26.6–33.0)
MCHC: 31.6 g/dL (ref 31.5–35.7)
MCV: 80 fL (ref 79–97)
Monocytes Absolute: 0.2 10*3/uL (ref 0.1–0.9)
Monocytes: 6 %
Neutrophils Absolute: 2.2 10*3/uL (ref 1.4–7.0)
Neutrophils: 77 %
Platelets: 54 10*3/uL — CL (ref 150–450)
RBC: 5.35 x10E6/uL (ref 4.14–5.80)
RDW: 17.1 % — ABNORMAL HIGH (ref 11.6–15.4)
WBC: 2.9 10*3/uL — ABNORMAL LOW (ref 3.4–10.8)

## 2022-04-04 LAB — HEMOGLOBIN A1C
Est. average glucose Bld gHb Est-mCnc: 180 mg/dL
Hgb A1c MFr Bld: 7.9 % — ABNORMAL HIGH (ref 4.8–5.6)

## 2022-04-04 LAB — PSA: Prostate Specific Ag, Serum: 0.7 ng/mL (ref 0.0–4.0)

## 2022-04-07 ENCOUNTER — Other Ambulatory Visit: Payer: Self-pay

## 2022-04-07 MED ORDER — MIRTAZAPINE 15 MG PO TABS: 15.0000 mg | ORAL_TABLET | Freq: Every day | ORAL | 2 refills | Status: DC

## 2022-04-10 ENCOUNTER — Other Ambulatory Visit: Payer: Self-pay | Admitting: Internal Medicine

## 2022-04-10 DIAGNOSIS — I10 Essential (primary) hypertension: Secondary | ICD-10-CM

## 2022-04-10 DIAGNOSIS — E119 Type 2 diabetes mellitus without complications: Secondary | ICD-10-CM

## 2022-04-14 NOTE — Progress Notes (Deleted)
[place]   [phone] [phone]   Clinic Follow up Note   Patient Care Team: [name], MD as PCP - General (Internal Medicine)  Date of Service:  04/14/2022  CHIEF COMPLAINT: f/u of  liver mass   CURRENT THERAPY:  Observation   ASSESSMENT: *** [name] is a 52 y.o. male with   No problem-specific Assessment & Plan notes found for this encounter.  ***   PLAN: {Everything Dr. Burr Medico talks to pt about, including reviewing scans and labs. } -{proceed with ***} -{lab with/without flush and f/u when?}   SUMMARY OF ONCOLOGIC HISTORY: Oncology History   No history exists.     INTERVAL HISTORY: *** [name] is here for a follow up of  liver mass  He was last seen by me on 01/01/2022 He presents to the clinic       All other systems were reviewed with the patient and are negative.  MEDICAL HISTORY:  Past Medical History:  Diagnosis Date   Alcoholic ([place])    Anxiety    Diabetes ([place])    DVT (deep venous thrombosis) ([place])    Hypertension     SURGICAL HISTORY: Past Surgical History:  Procedure Laterality Date   BLOOD PATCH     ESOPHAGOGASTRODUODENOSCOPY N/A 12/26/2021   Procedure: ESOPHAGOGASTRODUODENOSCOPY (EGD);  Surgeon: Ronnette Juniper, MD;  Location: [facility];  Service: Gastroenterology;  Laterality: N/A;   TONSILLECTOMY      I have reviewed the social history and family history with the patient and they are unchanged from previous note.  ALLERGIES:  has No Known Allergies.  MEDICATIONS:  Current Outpatient Medications  Medication Sig Dispense Refill   fexofenadine-pseudoephedrine (ALLEGRA-D) 60-120 MG 12 hr tablet Take 1 tablet by mouth 2 (two) times daily. 30 tablet 0   metFORMIN (GLUCOPHAGE) 500 MG tablet Take 1 tablet (500 mg total) by mouth 2 (two) times daily with a meal. 60 tablet 0   mirtazapine (REMERON) 15 MG tablet Take 1 tablet (15 mg total) by mouth at bedtime. 30 tablet 2    pantoprazole (PROTONIX) 40 MG tablet Take 1 tablet (40 mg total) by mouth 2 (two) times daily before a meal. 60 tablet 2   tamsulosin (FLOMAX) 0.4 MG CAPS capsule Take 1 capsule (0.4 mg total) by mouth daily after supper. 30 capsule 4   No current facility-administered medications for this visit.    PHYSICAL EXAMINATION: ECOG PERFORMANCE STATUS: {CHL ONC ECOG [phone]}  There were no vitals filed for this visit. Wt Readings from Last 3 Encounters:  04/03/22 162 lb (73.5 kg)  03/10/22 158 lb 11.7 oz (72 kg)  12/26/21 156 lb 12 oz (71.1 kg)    {Only keep what was examined. If exam not performed, can use .CEXAM } GENERAL:alert, no distress and comfortable SKIN: skin color, texture, turgor are normal, no rashes or significant lesions EYES: normal, Conjunctiva are pink and non-injected, sclera clear {OROPHARYNX:no exudate, no erythema and lips, buccal mucosa, and tongue normal}  NECK: supple, thyroid normal size, non-tender, without nodularity LYMPH:  no palpable lymphadenopathy in the cervical, axillary {or inguinal} LUNGS: clear to auscultation and percussion with normal breathing effort HEART: regular rate & rhythm and no murmurs and no lower extremity edema ABDOMEN:abdomen soft, non-tender and normal bowel sounds Musculoskeletal:no cyanosis of digits and no clubbing  NEURO: alert & oriented x 3 with fluent speech, no focal motor/sensory deficits  LABORATORY DATA:  I have reviewed the data as listed  Latest Ref Rng & Units 04/03/2022   11:17 AM 03/10/2022    7:36 AM 12/28/2021    8:54 AM  CBC  WBC 3.4 - 10.8 x10E3/uL 2.9  1.9  2.2   Hemoglobin 13.0 - 17.7 g/dL 13.6  12.3  8.9   Hematocrit 37.5 - 51.0 % 43.0  40.1  26.6   Platelets 150 - 450 x10E3/uL 54  12  30         Latest Ref Rng & Units 03/10/2022    7:36 AM 12/28/2021    8:54 AM 12/27/2021    4:00 AM  CMP  Glucose 70 - 99 mg/dL 205  126  186   BUN 6 - 20 mg/dL 5  9  11   $ Creatinine 0.61 - 1.24 mg/dL 0.53   0.68  0.54   Sodium 135 - 145 mmol/L 140  136  137   Potassium 3.5 - 5.1 mmol/L 3.7  2.8  3.7   Chloride 98 - 111 mmol/L 102  100  101   CO2 22 - 32 mmol/L 23  28  28   $ Calcium 8.9 - 10.3 mg/dL 8.6  8.2  8.5   Total Protein 6.5 - 8.1 g/dL 7.5     Total Bilirubin 0.3 - 1.2 mg/dL 2.0     Alkaline Phos 38 - 126 U/L 108     AST 15 - 41 U/L 207     ALT 0 - 44 U/L 147         RADIOGRAPHIC STUDIES: I have personally reviewed the radiological images as listed and agreed with the findings in the report. No results found.    No orders of the defined types were placed in this encounter.  All questions were answered. The patient knows to call the clinic with any problems, questions or concerns. No barriers to learning was detected. The total time spent in the appointment was {CHL ONC TIME VISIT - [id number].     Baldemar Friday, CMA 04/14/2022   I, Audry Riles, CMA, am acting as scribe for Truitt Merle, MD.   {Add scribe attestation statement}

## 2022-04-15 ENCOUNTER — Inpatient Hospital Stay: Payer: 59

## 2022-04-15 ENCOUNTER — Inpatient Hospital Stay: Payer: 59 | Attending: Hematology | Admitting: Hematology

## 2022-04-15 NOTE — Assessment & Plan Note (Deleted)
secondary to splenomegaly and alcohol abuse -Worsened anemia due to his recent GI bleeding.  He required blood transfusion -platelets were 35k on admission but dropped to 15k during admission. Recovered to 30k at time of discharge. -will continue monitoring

## 2022-04-15 NOTE — Assessment & Plan Note (Deleted)
-  presented with coffee-ground emesis, admitted 01/20/22. CT AP showed liver cirrhosis, splenomegaly, and a 4.6 cm liver mass. Baseline AFP was WNL. -MRI showed 4.6 cm hemorrhagic mass in segment 8, difficult to characterize by LI-RADS due to lack non rim enhancement, but nonetheless concerning for [facility] until proven otherwise -liver biopsy on 12/27/21 showed predominantly necrotic tissue, no evidence of malignancy. -Continue liver cancer surveillance with abdominal ultrasound or MRI and AFP every 6 months

## 2022-04-15 NOTE — Assessment & Plan Note (Deleted)
-  he has a history of heavy alcohol consumption -cirrhosis and small liver lesions were seen on CT AP back in 07/2012 (no records prior to this). -I again encouraged him to stop drinking alcohol completely.

## 2022-04-17 ENCOUNTER — Other Ambulatory Visit: Payer: Self-pay

## 2022-04-18 ENCOUNTER — Inpatient Hospital Stay: Payer: 59 | Admitting: Hematology

## 2022-04-18 ENCOUNTER — Inpatient Hospital Stay: Payer: 59

## 2022-04-18 NOTE — Progress Notes (Deleted)
[place]   Telephone:(336) [phone] Fax:(336) [phone]   Clinic Follow up Note   Patient Care Team: [name], MD as PCP - General (Internal Medicine) Truitt Merle, MD as Attending Physician (Hematology and Oncology)  Date of Service:  04/18/2022   CHIEF COMPLAINT: f/u of  liver mass   CURRENT THERAPY:  Observation   ASSESSMENT: *** [name] is a 52 y.o. male with   No problem-specific Assessment & Plan notes found for this encounter.  ***   PLAN:    SUMMARY OF ONCOLOGIC HISTORY: Oncology History   No history exists.     INTERVAL HISTORY: *** [name] is here for a follow up of  liver mass  He was last seen by me on 01/01/2022 He presents to the clinic   All other systems were reviewed with the patient and are negative.  MEDICAL HISTORY:  Past Medical History:  Diagnosis Date   Alcoholic ([place])    Anxiety    Diabetes ([place])    DVT (deep venous thrombosis) ([place])    Hypertension     SURGICAL HISTORY: Past Surgical History:  Procedure Laterality Date   BLOOD PATCH     ESOPHAGOGASTRODUODENOSCOPY N/A 12/26/2021   Procedure: ESOPHAGOGASTRODUODENOSCOPY (EGD);  Surgeon: Ronnette Juniper, MD;  Location: [facility];  Service: Gastroenterology;  Laterality: N/A;   TONSILLECTOMY      I have reviewed the social history and family history with the patient and they are unchanged from previous note.  ALLERGIES:  has No Known Allergies.  MEDICATIONS:  Current Outpatient Medications  Medication Sig Dispense Refill   fexofenadine-pseudoephedrine (ALLEGRA-D) 60-120 MG 12 hr tablet Take 1 tablet by mouth 2 (two) times daily. 30 tablet 0   metFORMIN (GLUCOPHAGE) 500 MG tablet Take 1 tablet (500 mg total) by mouth 2 (two) times daily with a meal. 60 tablet 0   mirtazapine (REMERON) 15 MG tablet Take 1 tablet (15 mg total) by mouth at bedtime. 30 tablet 2   pantoprazole (PROTONIX) 40 MG tablet Take 1 tablet (40 mg total) by mouth 2 (two)  times daily before a meal. 60 tablet 2   tamsulosin (FLOMAX) 0.4 MG CAPS capsule Take 1 capsule (0.4 mg total) by mouth daily after supper. 30 capsule 4   No current facility-administered medications for this visit.    PHYSICAL EXAMINATION: ECOG PERFORMANCE STATUS: {CHL ONC ECOG PS:[id number]}  There were no vitals filed for this visit. Wt Readings from Last 3 Encounters:  04/03/22 162 lb (73.5 kg)  03/10/22 158 lb 11.7 oz (72 kg)  12/26/21 156 lb 12 oz (71.1 kg)    {Only keep what was examined. If exam not performed, can use .CEXAM } GENERAL:alert, no distress and comfortable SKIN: skin color, texture, turgor are normal, no rashes or significant lesions EYES: normal, Conjunctiva are pink and non-injected, sclera clear {OROPHARYNX:no exudate, no erythema and lips, buccal mucosa, and tongue normal}  NECK: supple, thyroid normal size, non-tender, without nodularity LYMPH:  no palpable lymphadenopathy in the cervical, axillary {or inguinal} LUNGS: clear to auscultation and percussion with normal breathing effort HEART: regular rate & rhythm and no murmurs and no lower extremity edema ABDOMEN:abdomen soft, non-tender and normal bowel sounds Musculoskeletal:no cyanosis of digits and no clubbing  NEURO: alert & oriented x 3 with fluent speech, no focal motor/sensory deficits  LABORATORY DATA:  I have reviewed the data as listed    Latest Ref Rng & Units 04/03/2022   11:17 AM 03/10/2022    7:36  AM 12/28/2021    8:54 AM  CBC  WBC 3.4 - 10.8 x10E3/uL 2.9  1.9  2.2   Hemoglobin 13.0 - 17.7 g/dL 13.6  12.3  8.9   Hematocrit 37.5 - 51.0 % 43.0  40.1  26.6   Platelets 150 - 450 x10E3/uL 54  12  30         Latest Ref Rng & Units 03/10/2022    7:36 AM 12/28/2021    8:54 AM 12/27/2021    4:00 AM  CMP  Glucose 70 - 99 mg/dL 205  126  186   BUN 6 - 20 mg/dL '5  9  11   '$ Creatinine 0.61 - 1.24 mg/dL 0.53  0.68  0.54   Sodium 135 - 145 mmol/L 140  136  137   Potassium 3.5 - 5.1 mmol/L 3.7   2.8  3.7   Chloride 98 - 111 mmol/L 102  100  101   CO2 22 - 32 mmol/L '23  28  28   '$ Calcium 8.9 - 10.3 mg/dL 8.6  8.2  8.5   Total Protein 6.5 - 8.1 g/dL 7.5     Total Bilirubin 0.3 - 1.2 mg/dL 2.0     Alkaline Phos 38 - 126 U/L 108     AST 15 - 41 U/L 207     ALT 0 - 44 U/L 147         RADIOGRAPHIC STUDIES: I have personally reviewed the radiological images as listed and agreed with the findings in the report. No results found.    No orders of the defined types were placed in this encounter.  All questions were answered. The patient knows to call the clinic with any problems, questions or concerns. No barriers to learning was detected. The total time spent in the appointment was {CHL ONC TIME VISIT - [id number]}.     [name], CMA 04/18/2022   I, [name], CMA, am acting as scribe for Truitt Merle, MD.   {Add scribe attestation statement}

## 2022-04-18 NOTE — Assessment & Plan Note (Deleted)
-  he has a history of heavy alcohol consumption -cirrhosis and small liver lesions were seen on CT AP back in 07/2012 (no records prior to this). -I again encouraged him to stop drinking alcohol completely.

## 2022-04-18 NOTE — Assessment & Plan Note (Deleted)
-  presented with coffee-ground emesis, admitted 01/20/22. CT AP showed liver cirrhosis, splenomegaly, and a 4.6 cm liver mass. Baseline AFP was WNL. -MRI showed 4.6 cm hemorrhagic mass in segment 8, difficult to characterize by LI-RADS due to lack non rim enhancement, but nonetheless concerning for [facility] until proven otherwise -liver biopsy on 12/27/21 showed predominantly necrotic tissue, no evidence of malignancy. -Due to his liver cirrhosis, he is at high risk for hepatocellular carcinoma, I recommended continue cancer screening every 6 months with lab and ultrasound or CT/MRI

## 2022-04-18 NOTE — Assessment & Plan Note (Deleted)
secondary to splenomegaly and alcohol abuse -Worsened anemia due to his recent GI bleeding.  He required blood transfusion -platelets were 35k on admission but dropped to 15k during admission. Recovered to 30k at time of discharge. -new anemia on 12/24/21 with hgb 9.9, dropped to 7.3 during admission, recovered to 8.9.

## 2022-04-22 ENCOUNTER — Telehealth: Payer: Self-pay | Admitting: Hematology

## 2022-04-22 NOTE — Telephone Encounter (Signed)
Per 1/25 IB, msg left and calendar sent

## 2022-05-05 ENCOUNTER — Other Ambulatory Visit: Payer: Self-pay | Admitting: Internal Medicine

## 2022-05-05 ENCOUNTER — Other Ambulatory Visit: Payer: Self-pay

## 2022-05-06 ENCOUNTER — Ambulatory Visit: Payer: 59 | Admitting: Internal Medicine

## 2022-05-08 ENCOUNTER — Inpatient Hospital Stay: Payer: 59 | Admitting: Hematology

## 2022-05-08 ENCOUNTER — Inpatient Hospital Stay: Payer: 59 | Attending: Hematology

## 2022-05-09 ENCOUNTER — Telehealth: Payer: Self-pay | Admitting: Hematology

## 2022-05-09 NOTE — Telephone Encounter (Signed)
Per 2/16 IB reached out to patient to reschedule, Patient advised he isnt sure at this time and will call back when he's ready to move forward.

## 2022-05-16 ENCOUNTER — Ambulatory Visit: Payer: 59 | Admitting: Internal Medicine

## 2022-05-22 ENCOUNTER — Other Ambulatory Visit: Payer: Self-pay

## 2022-05-22 ENCOUNTER — Emergency Department (HOSPITAL_BASED_OUTPATIENT_CLINIC_OR_DEPARTMENT_OTHER): Payer: 59

## 2022-05-22 ENCOUNTER — Inpatient Hospital Stay (HOSPITAL_BASED_OUTPATIENT_CLINIC_OR_DEPARTMENT_OTHER)
Admission: EM | Admit: 2022-05-22 | Discharge: 2022-05-31 | DRG: 896 | Disposition: A | Discharge status: Home Health | Payer: 59 | Attending: Family Medicine | Admitting: Family Medicine

## 2022-05-22 DIAGNOSIS — E119 Type 2 diabetes mellitus without complications: Secondary | ICD-10-CM | POA: Diagnosis present

## 2022-05-22 DIAGNOSIS — K766 Portal hypertension: Secondary | ICD-10-CM | POA: Diagnosis present

## 2022-05-22 DIAGNOSIS — F10129 Alcohol abuse with intoxication, unspecified: Secondary | ICD-10-CM | POA: Diagnosis not present

## 2022-05-22 DIAGNOSIS — S301XXA Contusion of abdominal wall, initial encounter: Secondary | ICD-10-CM | POA: Diagnosis present

## 2022-05-22 DIAGNOSIS — F10221 Alcohol dependence with intoxication delirium: Principal | ICD-10-CM | POA: Diagnosis present

## 2022-05-22 DIAGNOSIS — E878 Other disorders of electrolyte and fluid balance, not elsewhere classified: Secondary | ICD-10-CM | POA: Diagnosis not present

## 2022-05-22 DIAGNOSIS — S37011A Minor contusion of right kidney, initial encounter: Secondary | ICD-10-CM | POA: Diagnosis not present

## 2022-05-22 DIAGNOSIS — F10231 Alcohol dependence with withdrawal delirium: Secondary | ICD-10-CM | POA: Diagnosis present

## 2022-05-22 DIAGNOSIS — F1010 Alcohol abuse, uncomplicated: Secondary | ICD-10-CM | POA: Diagnosis present

## 2022-05-22 DIAGNOSIS — E8770 Fluid overload, unspecified: Secondary | ICD-10-CM | POA: Diagnosis not present

## 2022-05-22 DIAGNOSIS — R000 Tachycardia, unspecified: Secondary | ICD-10-CM | POA: Diagnosis not present

## 2022-05-22 DIAGNOSIS — Z1152 Encounter for screening for COVID-19: Secondary | ICD-10-CM

## 2022-05-22 DIAGNOSIS — M545 Low back pain, unspecified: Secondary | ICD-10-CM | POA: Diagnosis present

## 2022-05-22 DIAGNOSIS — R160 Hepatomegaly, not elsewhere classified: Secondary | ICD-10-CM | POA: Diagnosis present

## 2022-05-22 DIAGNOSIS — E876 Hypokalemia: Secondary | ICD-10-CM | POA: Diagnosis present

## 2022-05-22 DIAGNOSIS — Z79899 Other long term (current) drug therapy: Secondary | ICD-10-CM

## 2022-05-22 DIAGNOSIS — A4101 Sepsis due to Methicillin susceptible Staphylococcus aureus: Secondary | ICD-10-CM | POA: Diagnosis not present

## 2022-05-22 DIAGNOSIS — Z8249 Family history of ischemic heart disease and other diseases of the circulatory system: Secondary | ICD-10-CM

## 2022-05-22 DIAGNOSIS — D696 Thrombocytopenia, unspecified: Secondary | ICD-10-CM | POA: Diagnosis not present

## 2022-05-22 DIAGNOSIS — W19XXXA Unspecified fall, initial encounter: Secondary | ICD-10-CM | POA: Diagnosis present

## 2022-05-22 DIAGNOSIS — R509 Fever, unspecified: Secondary | ICD-10-CM | POA: Diagnosis not present

## 2022-05-22 DIAGNOSIS — Y908 Blood alcohol level of 240 mg/100 ml or more: Secondary | ICD-10-CM | POA: Diagnosis present

## 2022-05-22 DIAGNOSIS — I10 Essential (primary) hypertension: Secondary | ICD-10-CM | POA: Diagnosis present

## 2022-05-22 DIAGNOSIS — K709 Alcoholic liver disease, unspecified: Principal | ICD-10-CM

## 2022-05-22 DIAGNOSIS — R296 Repeated falls: Secondary | ICD-10-CM | POA: Diagnosis present

## 2022-05-22 DIAGNOSIS — E871 Hypo-osmolality and hyponatremia: Secondary | ICD-10-CM | POA: Diagnosis present

## 2022-05-22 DIAGNOSIS — R3916 Straining to void: Secondary | ICD-10-CM | POA: Diagnosis present

## 2022-05-22 DIAGNOSIS — F10939 Alcohol use, unspecified with withdrawal, unspecified: Secondary | ICD-10-CM | POA: Diagnosis present

## 2022-05-22 DIAGNOSIS — B9561 Methicillin susceptible Staphylococcus aureus infection as the cause of diseases classified elsewhere: Secondary | ICD-10-CM | POA: Clinically undetermined

## 2022-05-22 DIAGNOSIS — E118 Type 2 diabetes mellitus with unspecified complications: Secondary | ICD-10-CM

## 2022-05-22 DIAGNOSIS — Z87828 Personal history of other (healed) physical injury and trauma: Secondary | ICD-10-CM

## 2022-05-22 DIAGNOSIS — R6520 Severe sepsis without septic shock: Secondary | ICD-10-CM | POA: Diagnosis not present

## 2022-05-22 DIAGNOSIS — K7460 Unspecified cirrhosis of liver: Secondary | ICD-10-CM | POA: Diagnosis present

## 2022-05-22 DIAGNOSIS — S40021A Contusion of right upper arm, initial encounter: Secondary | ICD-10-CM | POA: Diagnosis present

## 2022-05-22 DIAGNOSIS — N401 Enlarged prostate with lower urinary tract symptoms: Secondary | ICD-10-CM | POA: Diagnosis present

## 2022-05-22 DIAGNOSIS — K7030 Alcoholic cirrhosis of liver without ascites: Secondary | ICD-10-CM | POA: Diagnosis present

## 2022-05-22 DIAGNOSIS — N390 Urinary tract infection, site not specified: Secondary | ICD-10-CM | POA: Diagnosis present

## 2022-05-22 DIAGNOSIS — G4733 Obstructive sleep apnea (adult) (pediatric): Secondary | ICD-10-CM | POA: Diagnosis present

## 2022-05-22 DIAGNOSIS — F10931 Alcohol use, unspecified with withdrawal delirium: Secondary | ICD-10-CM | POA: Diagnosis present

## 2022-05-22 DIAGNOSIS — R7881 Bacteremia: Secondary | ICD-10-CM | POA: Clinically undetermined

## 2022-05-22 DIAGNOSIS — F10920 Alcohol use, unspecified with intoxication, uncomplicated: Secondary | ICD-10-CM

## 2022-05-22 DIAGNOSIS — R531 Weakness: Secondary | ICD-10-CM | POA: Diagnosis not present

## 2022-05-22 DIAGNOSIS — Z7984 Long term (current) use of oral hypoglycemic drugs: Secondary | ICD-10-CM

## 2022-05-22 DIAGNOSIS — D61818 Other pancytopenia: Secondary | ICD-10-CM | POA: Diagnosis present

## 2022-05-22 DIAGNOSIS — R4182 Altered mental status, unspecified: Secondary | ICD-10-CM | POA: Diagnosis not present

## 2022-05-22 DIAGNOSIS — S40022A Contusion of left upper arm, initial encounter: Secondary | ICD-10-CM | POA: Diagnosis present

## 2022-05-22 DIAGNOSIS — R0902 Hypoxemia: Secondary | ICD-10-CM | POA: Clinically undetermined

## 2022-05-22 LAB — CBC WITH DIFFERENTIAL/PLATELET
Abs Immature Granulocytes: 0.05 10*3/uL (ref 0.00–0.07)
Basophils Absolute: 0 10*3/uL (ref 0.0–0.1)
Basophils Relative: 0 %
Eosinophils Absolute: 0 10*3/uL (ref 0.0–0.5)
Eosinophils Relative: 0 %
HCT: 30.3 % — ABNORMAL LOW (ref 39.0–52.0)
Hemoglobin: 10.6 g/dL — ABNORMAL LOW (ref 13.0–17.0)
Immature Granulocytes: 1 %
Lymphocytes Relative: 8 %
Lymphs Abs: 0.5 10*3/uL — ABNORMAL LOW (ref 0.7–4.0)
MCH: 27.1 pg (ref 26.0–34.0)
MCHC: 35 g/dL (ref 30.0–36.0)
MCV: 77.5 fL — ABNORMAL LOW (ref 80.0–100.0)
Monocytes Absolute: 0.7 10*3/uL (ref 0.1–1.0)
Monocytes Relative: 12 %
Neutro Abs: 4.8 10*3/uL (ref 1.7–7.7)
Neutrophils Relative %: 79 %
Platelets: 9 10*3/uL — CL (ref 150–400)
RBC: 3.91 MIL/uL — ABNORMAL LOW (ref 4.22–5.81)
RDW: 18.9 % — ABNORMAL HIGH (ref 11.5–15.5)
WBC: 6 10*3/uL (ref 4.0–10.5)
nRBC: 0 % (ref 0.0–0.2)

## 2022-05-22 LAB — COMPREHENSIVE METABOLIC PANEL
ALT: 114 U/L — ABNORMAL HIGH (ref 0–44)
AST: 153 U/L — ABNORMAL HIGH (ref 15–41)
Albumin: 3.8 g/dL (ref 3.5–5.0)
Alkaline Phosphatase: 80 U/L (ref 38–126)
Anion gap: 16 — ABNORMAL HIGH (ref 5–15)
BUN: <5 mg/dL — ABNORMAL LOW (ref 6–20)
CO2: 19 mmol/L — ABNORMAL LOW (ref 22–32)
Calcium: 7.9 mg/dL — ABNORMAL LOW (ref 8.9–10.3)
Chloride: 89 mmol/L — ABNORMAL LOW (ref 98–111)
Creatinine, Ser: 0.49 mg/dL — ABNORMAL LOW (ref 0.61–1.24)
GFR, Estimated: >60 mL/min (ref >60)
Glucose, Bld: 210 mg/dL — ABNORMAL HIGH (ref 70–99)
Potassium: 3.6 mmol/L (ref 3.5–5.1)
Sodium: 124 mmol/L — ABNORMAL LOW (ref 135–145)
Total Bilirubin: 3.1 mg/dL — ABNORMAL HIGH (ref 0.3–1.2)
Total Protein: 6.6 g/dL (ref 6.5–8.1)

## 2022-05-22 LAB — LIPASE, BLOOD: Lipase: 76 U/L — ABNORMAL HIGH (ref 11–51)

## 2022-05-22 LAB — URINALYSIS, ROUTINE W REFLEX MICROSCOPIC
Bilirubin Urine: NEGATIVE
Glucose, UA: 250 mg/dL — AB
Ketones, ur: 40 mg/dL — AB
Leukocytes,Ua: NEGATIVE
Nitrite: NEGATIVE
Protein, ur: NEGATIVE mg/dL
Specific Gravity, Urine: <=1.005 (ref 1.005–1.030)
pH: 6 (ref 5.0–8.0)

## 2022-05-22 LAB — RAPID URINE DRUG SCREEN, HOSP PERFORMED
Amphetamines: NOT DETECTED
Barbiturates: NOT DETECTED
Benzodiazepines: NOT DETECTED
Cocaine: NOT DETECTED
Opiates: NOT DETECTED
Tetrahydrocannabinol: NOT DETECTED

## 2022-05-22 LAB — PROTIME-INR
INR: 1.3 — ABNORMAL HIGH (ref 0.8–1.2)
Prothrombin Time: 16.5 seconds — ABNORMAL HIGH (ref 11.4–15.2)

## 2022-05-22 LAB — URINALYSIS, MICROSCOPIC (REFLEX)

## 2022-05-22 LAB — ETHANOL: Alcohol, Ethyl (B): 415 mg/dL (ref <10)

## 2022-05-22 LAB — CBG MONITORING, ED: Glucose-Capillary: 210 mg/dL — ABNORMAL HIGH (ref 70–99)

## 2022-05-22 LAB — MAGNESIUM: Magnesium: 1.6 mg/dL — ABNORMAL LOW (ref 1.7–2.4)

## 2022-05-22 MED ORDER — THIAMINE MONONITRATE 100 MG PO TABS
100.0000 mg | ORAL_TABLET | Freq: Every day | ORAL | Status: DC
  Administered 2022-05-23 – 2022-05-31 (×8): 100 mg via ORAL
  Filled 2022-05-22 (×9): qty 1

## 2022-05-22 MED ORDER — FENTANYL CITRATE PF 50 MCG/ML IJ SOSY
50.0000 ug | PREFILLED_SYRINGE | Freq: Once | INTRAMUSCULAR | Status: AC
  Administered 2022-05-22: 50 ug via INTRAVENOUS
  Filled 2022-05-22: qty 1

## 2022-05-22 MED ORDER — ADULT MULTIVITAMIN W/MINERALS CH
1.0000 | ORAL_TABLET | Freq: Every day | ORAL | Status: DC
  Administered 2022-05-22 – 2022-05-31 (×10): 1 via ORAL
  Filled 2022-05-22 (×10): qty 1

## 2022-05-22 MED ORDER — PANTOPRAZOLE SODIUM 40 MG IV SOLR
40.0000 mg | Freq: Once | INTRAVENOUS | Status: AC
  Administered 2022-05-22: 40 mg via INTRAVENOUS
  Filled 2022-05-22: qty 10

## 2022-05-22 MED ORDER — THIAMINE HCL 100 MG/ML IJ SOLN
100.0000 mg | Freq: Every day | INTRAMUSCULAR | Status: DC
  Administered 2022-05-22: 100 mg via INTRAVENOUS
  Filled 2022-05-22 (×2): qty 2

## 2022-05-22 MED ORDER — LORAZEPAM 2 MG/ML IJ SOLN
1.0000 mg | INTRAMUSCULAR | Status: DC | PRN
  Administered 2022-05-23 – 2022-05-25 (×6): 2 mg via INTRAVENOUS
  Administered 2022-05-25: 1 mg via INTRAVENOUS
  Filled 2022-05-22 (×3): qty 1
  Filled 2022-05-22: qty 2
  Filled 2022-05-22 (×2): qty 1

## 2022-05-22 MED ORDER — LORAZEPAM 1 MG PO TABS
1.0000 mg | ORAL_TABLET | ORAL | Status: DC | PRN
  Administered 2022-05-23 (×2): 1 mg via ORAL
  Administered 2022-05-24: 3 mg via ORAL
  Administered 2022-05-24: 2 mg via ORAL
  Administered 2022-05-25 (×2): 1 mg via ORAL
  Filled 2022-05-22: qty 2
  Filled 2022-05-22: qty 1
  Filled 2022-05-22: qty 3
  Filled 2022-05-22 (×3): qty 1

## 2022-05-22 MED ORDER — MAGNESIUM SULFATE 2 GM/50ML IV SOLN
2.0000 g | Freq: Once | INTRAVENOUS | Status: AC
  Administered 2022-05-22: 2 g via INTRAVENOUS
  Filled 2022-05-22: qty 50

## 2022-05-22 MED ORDER — IOHEXOL 300 MG/ML  SOLN
100.0000 mL | Freq: Once | INTRAMUSCULAR | Status: AC | PRN
  Administered 2022-05-22: 100 mL via INTRAVENOUS

## 2022-05-22 MED ORDER — ONDANSETRON HCL 4 MG/2ML IJ SOLN
4.0000 mg | Freq: Once | INTRAMUSCULAR | Status: AC
  Administered 2022-05-22: 4 mg via INTRAVENOUS
  Filled 2022-05-22: qty 2

## 2022-05-22 MED ORDER — FOLIC ACID 1 MG PO TABS
1.0000 mg | ORAL_TABLET | Freq: Every day | ORAL | Status: DC
  Administered 2022-05-22 – 2022-05-31 (×10): 1 mg via ORAL
  Filled 2022-05-22 (×8): qty 1

## 2022-05-22 MED ORDER — SODIUM CHLORIDE 0.9 % IV BOLUS
1000.0000 mL | Freq: Once | INTRAVENOUS | Status: AC
  Administered 2022-05-22: 1000 mL via INTRAVENOUS

## 2022-05-22 NOTE — Progress Notes (Addendum)
Plan of Care Note for accepted transfer   Patient: [name] MRN: [id number]   DOA: 05/22/2022  Facility requesting transfer: [facility] Requesting Provider: Aletta Edouard, MD Reason for transfer: Recurrent falls with thrombocytopenia and alcohol intoxication. Facility course: [name] is a 52 y.o. male.  He is brought in by a friend for evaluation of his heavy alcohol use.  He cannot tell me how much she is drinking.  He does want to stop drinking and he said he has been here before and we prescribed him Librium which helped.  He has various bruises over his body including a large bruise on his right flank.  He thinks this is probably from falling.  He denies any medical complaints although level 5 caveat for intoxication.  When he came to the ER, BP was 142/103 with heart rate of 118 and otherwise normal vital signs.  Labs revealed hyponatremia and hypochloremia, possibly related to [place] mania, CO2 of 19 and glucose of 210 with calcium of 7.9 and anion gap of 16, magnesium 1.6 and lipase 76 with AST of 153 and ALT 114 and total bili 3.1.  CBC showed significant thrombocytopenia without platelet of 9 down from 54 on 04/03/2022.  INR is 1.3 with PT 16.5 and blood glucose was 210 R.  UA showed 250 glucose and 40 ketones.  Alcohol level was 414.  Portable chest x-ray showed no acute cardiopulmonary disease. Abdominal pelvic CT scan revealed the following: 1. Subcutaneous fat stranding over the right flank, compatible with history of contusion. No acute fracture is seen. 2. Morphologic changes of cirrhosis and portal hypertension with hepatic steatosis. 3. Heterogeneous mass in the anterior right lobe of the liver measuring 4.4 cm and hypervascular lesion in the left lobe of the liver measuring 1.6 cm, previously characterized by MRI as concerning for hepatocellular carcinoma. Correlation with prior biopsy results is recommended. 4. Moderate hiatal hernia. 5. Aortic  atherosclerosis and coronary artery calcifications.  The patient had a biopsy on 12/27/2021 for a right lower lobe liver mass that was suggestive of necrotic tumor.  The patient was interested in detox.  He was given 50 mcg of IV fentanyl, 2 g of IV magnesium sulfate, 4 mg of IV Zofran, 40 mg grams of IV Protonix and 1 L bolus of IV normal saline.  He was placed on CIWA protocol.  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  You will likely need platelet transfusion and GI consultation and likely heme-onc consult as well for his severe thrombocytopenia and possible HC Ca.  The patient will be under the care of the EDP until arrival to [facility].   Author: Christel Mormon, MD 05/22/2022  Check [email or web address] for on-call coverage.  Nursing staff, Please call [place] number on Amion as soon as patient's arrival, so appropriate admitting provider can evaluate the pt.

## 2022-05-22 NOTE — ED Notes (Signed)
Called [facility] for transport (to [place] [phone]) @ [phone]

## 2022-05-22 NOTE — ED Notes (Signed)
Patient has given verbal consent to transfer

## 2022-05-22 NOTE — ED Notes (Signed)
ED TO INPATIENT HANDOFF REPORT  ED Nurse Name and Phone #: Leo [facility] Paramedic [phone]  S Name/Age/Gender [name] 52 y.o. male Room/Bed: MH11/MH11  Code Status   Code Status: Prior  Home/SNF/Other Home Patient oriented to: self, place, time, and situation Is this baseline? Yes   Triage Complete: Triage complete  Chief Complaint Recurrent falls [R29.6]  Triage Note Pt arrives with friends with c/o alcohol intoxication. Per pt, he drinks 1-2 cases of beer a day and the last time he drank was about an hour ago. 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 Pt has bruises all over most of his body from falling. Pt a&ox3.    Allergies No Known Allergies  Level of Care/Admitting Diagnosis ED Disposition     ED Disposition  Admit   Condition  --   Comment  Hospital Area: [place] [[id number]]  Level of Care: Stepdown [14]  Admit to SDU based on following criteria: Other see comments  Comments: Severe thrombocytopenia, recurrent falls and alcohol intoxication  [facility] to [facility] if equivalent level of care is available:: No  Interfacility transfer: Yes  Covid Evaluation: Asymptomatic - no recent exposure (last 10 days) testing not required  Diagnosis: Recurrent falls [725973]  Admitting Physician: Christel Mormon [id number]  Attending Physician: Christel Mormon [id number]  Certification:: I certify this patient will need inpatient services for at least 2 midnights  Estimated Length of Stay: 2          B Medical/Surgery History Past Medical History:  Diagnosis Date   Alcoholic ([place])    Anxiety    Diabetes ([place])    DVT (deep venous thrombosis) ([place])    Hypertension    Past Surgical History:  Procedure Laterality Date   BLOOD PATCH     ESOPHAGOGASTRODUODENOSCOPY N/A 12/26/2021   Procedure: ESOPHAGOGASTRODUODENOSCOPY (EGD);  Surgeon: Ronnette Juniper, MD;  Location: [facility];   Service: Gastroenterology;  Laterality: N/A;   TONSILLECTOMY       A IV Location/Drains/Wounds Patient Lines/Drains/Airways Status     Active Line/Drains/Airways     Name Placement date Placement time Site Days   Peripheral IV 05/22/22 18 G Right Antecubital 05/22/22  1856  Antecubital  less than 1            Intake/Output Last 24 hours  Intake/Output Summary (Last 24 hours) at 05/22/2022 2317 Last data filed at 05/22/2022 2215 Gross per 24 hour  Intake 2050.04 ml  Output 3000 ml  Net -949.96 ml    Labs/Imaging Results for orders placed or performed during the hospital encounter of 05/22/22 (from the past 48 hour(s))  Comprehensive metabolic panel     Status: Abnormal   Collection Time: 05/22/22  7:01 PM  Result Value Ref Range   Sodium 124 (L) 135 - 145 mmol/L   Potassium 3.6 3.5 - 5.1 mmol/L   Chloride 89 (L) 98 - 111 mmol/L   CO2 19 (L) 22 - 32 mmol/L   Glucose, Bld 210 (H) 70 - 99 mg/dL    Comment: Glucose reference range applies only to samples taken after fasting for at least 8 hours.   BUN <5 (L) 6 - 20 mg/dL   Creatinine, Ser 0.49 (L) 0.61 - 1.24 mg/dL   Calcium 7.9 (L) 8.9 - 10.3 mg/dL   Total Protein 6.6 6.5 - 8.1 g/dL   Albumin 3.8 3.5 - 5.0 g/dL   AST 153 (  H) 15 - 41 U/L   ALT 114 (H) 0 - 44 U/L   Alkaline Phosphatase 80 38 - 126 U/L   Total Bilirubin 3.1 (H) 0.3 - 1.2 mg/dL   GFR, Estimated >60 >60 mL/min    Comment: (NOTE) Calculated using the CKD-EPI Creatinine Equation (2021)    Anion gap 16 (H) 5 - 15    Comment: Performed at [facility], [address] [place], [place] [address]  Lipase, blood     Status: Abnormal   Collection Time: 05/22/22  7:01 PM  Result Value Ref Range   Lipase 76 (H) 11 - 51 U/L    Comment: Performed at [facility], [address] [place], [place] [address]  CBC with Differential     Status: Abnormal   Collection Time: 05/22/22  7:01 PM  Result Value Ref Range   WBC 6.0 4.0 - 10.5 K/uL    RBC 3.91 (L) 4.22 - 5.81 MIL/uL   Hemoglobin 10.6 (L) 13.0 - 17.0 g/dL   HCT 30.3 (L) 39.0 - 52.0 %   MCV 77.5 (L) 80.0 - 100.0 fL   MCH 27.1 26.0 - 34.0 pg   MCHC 35.0 30.0 - 36.0 g/dL   RDW 18.9 (H) 11.5 - 15.5 %   Platelets 9 (LL) 150 - 400 K/uL    Comment: Immature Platelet Fraction may be clinically indicated, consider ordering this additional test [id number] THIS CRITICAL RESULT HAS VERIFIED AND BEEN CALLED TO ALFRED DILLARD RN BY ISAAC [place] ON 02 29 2024 AT 1947, AND HAS BEEN READ BACK.     nRBC 0.0 0.0 - 0.2 %   Neutrophils Relative % 79 %   Neutro Abs 4.8 1.7 - 7.7 K/uL   Lymphocytes Relative 8 %   Lymphs Abs 0.5 (L) 0.7 - 4.0 K/uL   Monocytes Relative 12 %   Monocytes Absolute 0.7 0.1 - 1.0 K/uL   Eosinophils Relative 0 %   Eosinophils Absolute 0.0 0.0 - 0.5 K/uL   Basophils Relative 0 %   Basophils Absolute 0.0 0.0 - 0.1 K/uL   Immature Granulocytes 1 %   Abs Immature Granulocytes 0.05 0.00 - 0.07 K/uL    Comment: Performed at [facility], [address] [place], [place] [address]  Ethanol     Status: Abnormal   Collection Time: 05/22/22  7:01 PM  Result Value Ref Range   Alcohol, Ethyl (B) 415 (HH) <10 mg/dL    Comment: CRITICAL RESULT CALLED TO, READ BACK BY AND VERIFIED WITH ALFRED DILLARD RN AT 1951 ON 05/22/22 BY I.[email or web address] (NOTE) Lowest detectable limit for serum alcohol is 10 mg/dL.  For medical purposes only. Performed at [facility], [address] [place], [place] [address]   Magnesium     Status: Abnormal   Collection Time: 05/22/22  7:01 PM  Result Value Ref Range   Magnesium 1.6 (L) 1.7 - 2.4 mg/dL    Comment: Performed at [facility], [address] [place], [place] [address]  Protime-INR     Status: Abnormal   Collection Time: 05/22/22  7:01 PM  Result Value Ref Range   Prothrombin Time 16.5 (H) 11.4 - 15.2 seconds   INR 1.3 (H) 0.8 - 1.2    Comment: (NOTE) INR goal varies based on device and disease  states. Performed at [facility], [address] [place], [place] [address]   Urinalysis, Routine w reflex  microscopic -Urine, Clean Catch     Status: Abnormal   Collection Time: 05/22/22  7:32 PM  Result Value Ref Range   Color, Urine YELLOW YELLOW   APPearance CLEAR CLEAR   Specific Gravity, Urine <=1.005 1.005 - 1.030   pH 6.0 5.0 - 8.0   Glucose, UA 250 (A) NEGATIVE mg/dL   Hgb urine dipstick SMALL (A) NEGATIVE   Bilirubin Urine NEGATIVE NEGATIVE   Ketones, ur 40 (A) NEGATIVE mg/dL   Protein, ur NEGATIVE NEGATIVE mg/dL   Nitrite NEGATIVE NEGATIVE   Leukocytes,Ua NEGATIVE NEGATIVE    Comment: Performed at [facility], [address] [place], [place] [address]  Urine rapid drug screen (hosp performed)     Status: None   Collection Time: 05/22/22  7:32 PM  Result Value Ref Range   Opiates NONE DETECTED NONE DETECTED   Cocaine NONE DETECTED NONE DETECTED   Benzodiazepines NONE DETECTED NONE DETECTED   Amphetamines NONE DETECTED NONE DETECTED   Tetrahydrocannabinol NONE DETECTED NONE DETECTED   Barbiturates NONE DETECTED NONE DETECTED    Comment: (NOTE) DRUG SCREEN FOR MEDICAL PURPOSES ONLY.  IF CONFIRMATION IS NEEDED FOR ANY PURPOSE, NOTIFY LAB WITHIN 5 DAYS.  LOWEST DETECTABLE LIMITS FOR URINE DRUG SCREEN Drug Class                     Cutoff (ng/mL) Amphetamine and metabolites    1000 Barbiturate and metabolites    200 Benzodiazepine                 200 Opiates and metabolites        300 Cocaine and metabolites        300 THC                            50 Performed at [facility], [address] [place], [place] [address]   Urinalysis, Microscopic (reflex)     Status: Abnormal   Collection Time: 05/22/22  7:32 PM  Result Value Ref Range   RBC / HPF 0-5 0 - 5 RBC/hpf   WBC, UA 0-5 0 - 5 WBC/hpf   Bacteria, UA RARE (A) NONE SEEN   Squamous Epithelial / HPF 0-5 0 - 5 /HPF    Comment: Performed at [facility], [address] [place], [place] [address]  CBG monitoring, ED     Status: Abnormal   Collection Time: 05/22/22 10:22 PM  Result Value Ref Range   Glucose-Capillary 210 (H) 70 - 99 mg/dL    Comment: Glucose reference range applies only to samples taken after fasting for at least 8 hours.   CT Abdomen Pelvis W Contrast  Result Date: 05/22/2022 CLINICAL DATA:  Abdominal pain, right flank hematoma. Multiple bruises on body from following. EXAM: CT ABDOMEN AND PELVIS WITH CONTRAST TECHNIQUE: Multidetector CT imaging of the abdomen and pelvis was performed using the standard protocol following bolus administration of intravenous contrast. RADIATION DOSE REDUCTION: This exam was performed according to the departmental dose-optimization program which includes automated exposure control, adjustment of the mA and/or kV according to patient size and/or use of iterative reconstruction technique. CONTRAST:  124m OMNIPAQUE IOHEXOL 300 MG/ML  SOLN COMPARISON:  08/06/2012, 12/25/2021. FINDINGS: Lower chest: The heart is normal in size and coronary artery calcifications are noted. The lung bases are clear. Hepatobiliary: The liver has a nodular contour, compatible with underlying cirrhosis. Fatty infiltration  of the liver is noted. There is a circumscribed heterogenous mass in the anterior right lobe of the liver measuring 4.4 cm. There is a 1.6 cm hypervascular focus in the left lobe of the liver, hepatic segment 4B. No biliary ductal dilatation. The gallbladder is without stones. Pancreas: Unremarkable. No pancreatic ductal dilatation or surrounding inflammatory changes. Spleen: Spleen is enlarged measuring 17.3 cm.  No focal abnormality. Adrenals/Urinary Tract: No adrenal nodule or mass. The kidney enhance symmetrically. No renal calculus or hydronephrosis. The bladder is unremarkable. Stomach/Bowel: Moderate hiatal hernia. Esophageal varices are noted. No obstruction, free air or pneumatosis. Appendix appears normal. No  focal bowel wall thickening or surrounding inflammatory changes. Vascular/Lymphatic: Aortic atherosclerosis. The portal vein, splenic vein, and superior mesenteric vein are patent. Esophageal varices are present. A few scattered varices are noted in the mid right abdomen. No abdominal or pelvic lymphadenopathy by size criteria. Reproductive: Prostate is unremarkable. Other: A trace amount of ascites is noted in the pelvis on the right. Musculoskeletal: No acute or suspicious osseous abnormality. Subcutaneous fat stranding is present over the right flank, compatible with known contusion. IMPRESSION: 1. Subcutaneous fat stranding over the right flank, compatible with history of contusion. No acute fracture is seen. 2. Morphologic changes of cirrhosis and portal hypertension with hepatic steatosis. 3. Heterogeneous mass in the anterior right lobe of the liver measuring 4.4 cm and hypervascular lesion in the left lobe of the liver measuring 1.6 cm, previously characterized by MRI as concerning for hepatocellular carcinoma. Correlation with prior biopsy results is recommended. 4. Moderate hiatal hernia. 5. Aortic atherosclerosis and coronary artery calcifications. Electronically Signed   By: Brett Fairy M.D.   On: 05/22/2022 21:28   CT Head Wo Contrast  Result Date: 05/22/2022 CLINICAL DATA:  Mental status change, unknown cause EXAM: CT HEAD WITHOUT CONTRAST TECHNIQUE: Contiguous axial images were obtained from the base of the skull through the vertex without intravenous contrast. RADIATION DOSE REDUCTION: This exam was performed according to the departmental dose-optimization program which includes automated exposure control, adjustment of the mA and/or kV according to patient size and/or use of iterative reconstruction technique. COMPARISON:  Head CT 03/10/2022 FINDINGS: Brain: No intracranial hemorrhage, mass effect, or midline shift. No hydrocephalus. The basilar cisterns are patent. Stable chronic small vessel  ischemic change. Stable atrophy. No evidence of territorial infarct or acute ischemia. No extra-axial or intracranial fluid collection. Vascular: Atherosclerosis of skullbase vasculature without hyperdense vessel or abnormal calcification. Skull: No fracture or focal lesion. Sinuses/Orbits: No acute findings. Chronic opacification of a few left mastoid air cells. Other: None. IMPRESSION: 1. No acute intracranial abnormality. 2. Stable atrophy and chronic small vessel ischemic change. Electronically Signed   By: Keith Rake M.D.   On: 05/22/2022 21:08   DG Chest Port 1 View  Result Date: 05/22/2022 CLINICAL DATA:  Weakness EXAM: PORTABLE CHEST 1 VIEW COMPARISON:  12/24/2021 FINDINGS: The heart size and mediastinal contours are within normal limits. Both lungs are clear. The visualized skeletal structures are unremarkable. IMPRESSION: No active disease. Electronically Signed   By: Fidela Salisbury M.D.   On: 05/22/2022 19:08    Pending Labs Unresulted Labs (From admission, onward)    None       Vitals/Pain Today's Vitals   05/22/22 1921 05/22/22 2130 05/22/22 2159 05/22/22 2239  BP: (!) 139/100  (!) 148/83   Pulse: (!) 122  (!) 108   Resp:      Temp:    98.6 F (37 C)  TempSrc:  Oral  SpO2:      Weight:      PainSc:  2       Isolation Precautions No active isolations  Medications Medications  LORazepam (ATIVAN) tablet 1-4 mg (has no administration in time range)    Or  LORazepam (ATIVAN) injection 1-4 mg (has no administration in time range)  thiamine (VITAMIN B1) tablet 100 mg ( Oral See Alternative 05/22/22 1913)    Or  thiamine (VITAMIN B1) injection 100 mg (100 mg Intravenous Given [id number])  folic acid (FOLVITE) tablet 1 mg (1 mg Oral Given 05/22/22 1917)  multivitamin with minerals tablet 1 tablet (1 tablet Oral Given 05/22/22 1915)  sodium chloride 0.9 % bolus 1,000 mL ( Intravenous Stopped 05/22/22 2012)  ondansetron (ZOFRAN) injection 4 mg (4 mg Intravenous Given  05/22/22 1910)  pantoprazole (PROTONIX) injection 40 mg (40 mg Intravenous Given 05/22/22 1918)  magnesium sulfate IVPB 2 g 50 mL (0 g Intravenous Stopped 05/22/22 2042)  fentaNYL (SUBLIMAZE) injection 50 mcg (50 mcg Intravenous Given 05/22/22 2054)  iohexol (OMNIPAQUE) 300 MG/ML solution 100 mL (100 mLs Intravenous Contrast Given 05/22/22 2107)    Mobility walks     Focused Assessments     R Recommendations: See Admitting Provider Note  Report given to:   Additional Notes:

## 2022-05-22 NOTE — ED Provider Notes (Signed)
EMERGENCY DEPARTMENT AT [facility] Provider Note   CSN: [id number] Arrival date & time: 05/22/22  1820     History  Chief Complaint  Patient presents with   Alcohol Problem    [name] is a 52 y.o. male.  He is brought in by a friend for evaluation of his heavy alcohol use.  He cannot tell me how much she is drinking.  He does want to stop drinking and he said he has been here before and we prescribed him Librium which helped.  He has various bruises over his body including a large bruise on his right flank.  He thinks this is probably from falling.  He denies any medical complaints although level 5 caveat for intoxication.  The history is provided by the patient and a friend.  Alcohol Problem This is a recurrent problem. The problem occurs constantly. The problem has not changed since onset.Pertinent negatives include no chest pain, no abdominal pain, no headaches and no shortness of breath. The symptoms are aggravated by drinking. Nothing relieves the symptoms. He has tried nothing for the symptoms. The treatment provided no relief.       Home Medications Prior to Admission medications   Medication Sig Start Date End Date Taking? Authorizing Provider  fexofenadine-pseudoephedrine (ALLEGRA-D) 60-120 MG 12 hr tablet Take 1 tablet by mouth 2 (two) times daily. 04/03/22 04/03/23  [name], MD  folic acid (FOLVITE) 1 MG tablet TAKE 1 TABLET BY MOUTH EVERY DAY 05/05/22   [name], MD  folic acid (FOLVITE) 1 MG tablet Take 1 mg by mouth daily. 04/07/22   [provider]  metFORMIN (GLUCOPHAGE) 500 MG tablet Take 1 tablet (500 mg total) by mouth 2 (two) times daily with a meal. 09/23/21   Dykstra, Ellwood Dense, MD  mirtazapine (REMERON) 15 MG tablet Take 1 tablet (15 mg total) by mouth at bedtime. 04/07/22   [name], MD  pantoprazole (PROTONIX) 40 MG tablet Take 1 tablet (40 mg total) by mouth 2 (two) times daily before a meal. 12/28/21 03/28/22  Dahal,  Marlowe Aschoff, MD  tamsulosin (FLOMAX) 0.4 MG CAPS capsule Take 1 capsule (0.4 mg total) by mouth daily after supper. 04/03/22   [name], MD      Allergies    Patient has no known allergies.    Review of Systems   Review of Systems  Constitutional:  Negative for fever.  Eyes:  Negative for visual disturbance.  Respiratory:  Negative for shortness of breath.   Cardiovascular:  Negative for chest pain.  Gastrointestinal:  Negative for abdominal pain.  Skin:  Positive for color change (bruising).  Neurological:  Negative for headaches.    Physical Exam Updated Vital Signs BP (!) 139/100   Pulse (!) 122   Temp 98.5 F (36.9 C)   Resp 20   Wt 72.6 kg   SpO2 97%   BMI 25.06 kg/m  Physical Exam Vitals and nursing note reviewed.  Constitutional:      General: He is not in acute distress.    Appearance: Normal appearance. He is well-developed.  HENT:     Head: Normocephalic and atraumatic.  Eyes:     General: No scleral icterus.    Conjunctiva/sclera: Conjunctivae normal.     Comments: Patient's right pupil is markedly dilated from the left.  He said he has an eye injury from a few months ago and he is seeing an eye doctor for it.  Cardiovascular:  Rate and Rhythm: Regular rhythm. Tachycardia present.     Heart sounds: No murmur heard. Pulmonary:     Effort: Pulmonary effort is normal. No respiratory distress.     Breath sounds: Normal breath sounds.  Abdominal:     Palpations: Abdomen is soft.     Tenderness: There is no abdominal tenderness. There is no guarding or rebound.  Musculoskeletal:        General: No swelling.     Cervical back: Neck supple.  Skin:    General: Skin is warm and dry.     Capillary Refill: Capillary refill takes less than 2 seconds.     Findings: Bruising present.     Comments: Patient has bruising on his upper arms bilaterally.  There is a very large right flank hematoma.  Neurological:     General: No focal deficit present.     Mental  Status: He is alert.     Comments: He is awake.  He has slurred speech consistent with intoxication.  He is moving all extremities nonfocally although not really participating in a neurologic exam.     ED Results / Procedures / Treatments   Labs (all labs ordered are listed, but only abnormal results are displayed) Labs Reviewed  COMPREHENSIVE METABOLIC PANEL - Abnormal; Notable for the following components:      Result Value   Sodium 124 (*)    Chloride 89 (*)    CO2 19 (*)    Glucose, Bld 210 (*)    BUN <5 (*)    Creatinine, Ser 0.49 (*)    Calcium 7.9 (*)    AST 153 (*)    ALT 114 (*)    Total Bilirubin 3.1 (*)    Anion gap 16 (*)    All other components within normal limits  LIPASE, BLOOD - Abnormal; Notable for the following components:   Lipase 76 (*)    All other components within normal limits  CBC WITH DIFFERENTIAL/PLATELET - Abnormal; Notable for the following components:   RBC 3.91 (*)    Hemoglobin 10.6 (*)    HCT 30.3 (*)    MCV 77.5 (*)    RDW 18.9 (*)    Platelets 9 (*)    Lymphs Abs 0.5 (*)    All other components within normal limits  ETHANOL - Abnormal; Notable for the following components:   Alcohol, Ethyl (B) 415 (*)    All other components within normal limits  MAGNESIUM - Abnormal; Notable for the following components:   Magnesium 1.6 (*)    All other components within normal limits  URINALYSIS, ROUTINE W REFLEX MICROSCOPIC - Abnormal; Notable for the following components:   Glucose, UA 250 (*)    Hgb urine dipstick SMALL (*)    Ketones, ur 40 (*)    All other components within normal limits  PROTIME-INR - Abnormal; Notable for the following components:   Prothrombin Time 16.5 (*)    INR 1.3 (*)    All other components within normal limits  URINALYSIS, MICROSCOPIC (REFLEX) - Abnormal; Notable for the following components:   Bacteria, UA RARE (*)    All other components within normal limits  RAPID URINE DRUG SCREEN, HOSP PERFORMED     EKG EKG Interpretation  Date/Time:  Thursday May 22 2022 19:09:28 EST Ventricular Rate:  102 PR Interval:  157 QRS Duration: 105 QT Interval:  376 QTC Calculation: 490 R Axis:   30 Text Interpretation: Sinus tachycardia Ventricular premature complex Borderline prolonged QT  interval No significant change since prior 12/23 Confirmed by Aletta Edouard [id number]) on 05/22/2022 7:10:20 PM  Radiology CT Abdomen Pelvis W Contrast  Result Date: 05/22/2022 CLINICAL DATA:  Abdominal pain, right flank hematoma. Multiple bruises on body from following. EXAM: CT ABDOMEN AND PELVIS WITH CONTRAST TECHNIQUE: Multidetector CT imaging of the abdomen and pelvis was performed using the standard protocol following bolus administration of intravenous contrast. RADIATION DOSE REDUCTION: This exam was performed according to the departmental dose-optimization program which includes automated exposure control, adjustment of the mA and/or kV according to patient size and/or use of iterative reconstruction technique. CONTRAST:  139m OMNIPAQUE IOHEXOL 300 MG/ML  SOLN COMPARISON:  08/06/2012, 12/25/2021. FINDINGS: Lower chest: The heart is normal in size and coronary artery calcifications are noted. The lung bases are clear. Hepatobiliary: The liver has a nodular contour, compatible with underlying cirrhosis. Fatty infiltration of the liver is noted. There is a circumscribed heterogenous mass in the anterior right lobe of the liver measuring 4.4 cm. There is a 1.6 cm hypervascular focus in the left lobe of the liver, hepatic segment 4B. No biliary ductal dilatation. The gallbladder is without stones. Pancreas: Unremarkable. No pancreatic ductal dilatation or surrounding inflammatory changes. Spleen: Spleen is enlarged measuring 17.3 cm.  No focal abnormality. Adrenals/Urinary Tract: No adrenal nodule or mass. The kidney enhance symmetrically. No renal calculus or hydronephrosis. The bladder is unremarkable. Stomach/Bowel:  Moderate hiatal hernia. Esophageal varices are noted. No obstruction, free air or pneumatosis. Appendix appears normal. No focal bowel wall thickening or surrounding inflammatory changes. Vascular/Lymphatic: Aortic atherosclerosis. The portal vein, splenic vein, and superior mesenteric vein are patent. Esophageal varices are present. A few scattered varices are noted in the mid right abdomen. No abdominal or pelvic lymphadenopathy by size criteria. Reproductive: Prostate is unremarkable. Other: A trace amount of ascites is noted in the pelvis on the right. Musculoskeletal: No acute or suspicious osseous abnormality. Subcutaneous fat stranding is present over the right flank, compatible with known contusion. IMPRESSION: 1. Subcutaneous fat stranding over the right flank, compatible with history of contusion. No acute fracture is seen. 2. Morphologic changes of cirrhosis and portal hypertension with hepatic steatosis. 3. Heterogeneous mass in the anterior right lobe of the liver measuring 4.4 cm and hypervascular lesion in the left lobe of the liver measuring 1.6 cm, previously characterized by MRI as concerning for hepatocellular carcinoma. Correlation with prior biopsy results is recommended. 4. Moderate hiatal hernia. 5. Aortic atherosclerosis and coronary artery calcifications. Electronically Signed   By: LBrett FairyM.D.   On: 05/22/2022 21:28   CT Head Wo Contrast  Result Date: 05/22/2022 CLINICAL DATA:  Mental status change, unknown cause EXAM: CT HEAD WITHOUT CONTRAST TECHNIQUE: Contiguous axial images were obtained from the base of the skull through the vertex without intravenous contrast. RADIATION DOSE REDUCTION: This exam was performed according to the departmental dose-optimization program which includes automated exposure control, adjustment of the mA and/or kV according to patient size and/or use of iterative reconstruction technique. COMPARISON:  Head CT 03/10/2022 FINDINGS: Brain: No  intracranial hemorrhage, mass effect, or midline shift. No hydrocephalus. The basilar cisterns are patent. Stable chronic small vessel ischemic change. Stable atrophy. No evidence of territorial infarct or acute ischemia. No extra-axial or intracranial fluid collection. Vascular: Atherosclerosis of skullbase vasculature without hyperdense vessel or abnormal calcification. Skull: No fracture or focal lesion. Sinuses/Orbits: No acute findings. Chronic opacification of a few left mastoid air cells. Other: None. IMPRESSION: 1. No acute intracranial abnormality. 2. Stable atrophy and  chronic small vessel ischemic change. Electronically Signed   By: Keith Rake M.D.   On: 05/22/2022 21:08   DG Chest Port 1 View  Result Date: 05/22/2022 CLINICAL DATA:  Weakness EXAM: PORTABLE CHEST 1 VIEW COMPARISON:  12/24/2021 FINDINGS: The heart size and mediastinal contours are within normal limits. Both lungs are clear. The visualized skeletal structures are unremarkable. IMPRESSION: No active disease. Electronically Signed   By: Fidela Salisbury M.D.   On: 05/22/2022 19:08    Procedures .Critical Care  Performed by: Hayden Rasmussen, MD Authorized by: Hayden Rasmussen, MD   Critical care provider statement:    Critical care time (minutes):  45   Critical care time was exclusive of:  Separately billable procedures and treating other patients   Critical care was necessary to treat or prevent imminent or life-threatening deterioration of the following conditions:  Metabolic crisis and toxidrome   Critical care was time spent personally by me on the following activities:  Development of treatment plan with patient or surrogate, discussions with consultants, evaluation of patient's response to treatment, examination of patient, obtaining history from patient or surrogate, ordering and performing treatments and interventions, ordering and review of laboratory studies, ordering and review of radiographic studies, pulse  oximetry, re-evaluation of patient's condition and review of old charts   I assumed direction of critical care for this patient from another provider in my specialty: no       Medications Ordered in ED Medications  LORazepam (ATIVAN) tablet 1-4 mg (has no administration in time range)    Or  LORazepam (ATIVAN) injection 1-4 mg (has no administration in time range)  thiamine (VITAMIN B1) tablet 100 mg ( Oral See Alternative 05/22/22 1913)    Or  thiamine (VITAMIN B1) injection 100 mg (100 mg Intravenous Given [id number] [id number])  folic acid (FOLVITE) tablet 1 mg (1 mg Oral Given 05/22/22 1917)  multivitamin with minerals tablet 1 tablet (1 tablet Oral Given 05/22/22 1915)  sodium chloride 0.9 % bolus 1,000 mL (1,000 mLs Intravenous New Bag/Given 05/22/22 1910)  ondansetron (ZOFRAN) injection 4 mg (4 mg Intravenous Given 05/22/22 1910)  pantoprazole (PROTONIX) injection 40 mg (40 mg Intravenous Given 05/22/22 1918)    ED Course/ Medical Decision Making/ A&P Clinical Course as of 05/23/22 0905  Thu May 22, 2022  1910 chest x-ray interpreted by me as no acute infiltrate.  Awaiting radiology reading. [MB]  1959 On review of prior notes patient was admitted back in October for coffee-ground emesis and had low platelets.  Underwent EGD and had some varices. [MB]  2115 Patient did have a biopsy of his liver lesion done back in October when he was admitted. [MB]  2224 Discussed with [facility] hospitalist Dr. Sidney Ace who will put the patient in for a bed. [MB]    Clinical Course User Index [MB] Hayden Rasmussen, MD                             Medical Decision Making Amount and/or Complexity of Data Reviewed Labs: ordered. Radiology: ordered.  Risk OTC drugs. Prescription drug management. Decision regarding hospitalization.   This patient complains of alcohol intoxication falls bruising; this involves an extensive number of treatment Options and is a complaint that carries with it a high risk of  complications and morbidity. The differential includes bruising, coagulopathy, liver disease, intoxication, withdrawal, metabolic derangement  I ordered, reviewed and interpreted labs, which included CBC with normal white  count, hemoglobin slightly down from priors, platelets critically low, chemistries with low sodium elevated glucose low bicarb, elevated LFTs.  INR slightly elevated at 1.3.  Urinalysis with ketones no signs of infection.  Urine tox negative.  Magnesium low at 1.6. I ordered medication IV fluids vitamins magnesium PPI and reviewed PMP when indicated. I ordered imaging studies which included chest x-ray, CT head and abdomen and pelvis and I independently    visualized and interpreted imaging which showed soft tissue contusion right flank.  Signs of cirrhosis and liver mass. Additional history obtained from patient's companion Previous records obtained and reviewed in epic including prior hematology and GI notes I consulted [facility] hospitalist Dr. Sidney Ace and discussed lab and imaging findings and discussed disposition.  Cardiac monitoring reviewed, this tachycardia Social determinants considered, alcoholism Critical Interventions: Initiation of fluids and vitamins, initiation of IV benzodiazepines for possible withdrawal  After the interventions stated above, I reevaluated the patient and found patient's mental status to be reasonably clear. Admission and further testing considered, he would benefit from mission the hospital for further management of his alcoholism and liver disease and coagulopathy.  Patient in agreement with plan for admission.         Final Clinical Impression(s) / ED Diagnoses Final diagnoses:  Acute on chronic alcoholic liver disease ([place])  Thrombocytopenia ([place])  Hyponatremia  Hematoma of right flank, initial encounter  Acute alcoholic intoxication without complication ([place])  Hypomagnesemia    Rx / DC Orders ED Discharge Orders     None          Hayden Rasmussen, MD 05/23/22 [phone]

## 2022-05-22 NOTE — ED Notes (Signed)
Patient transported to CT 

## 2022-05-22 NOTE — ED Triage Notes (Signed)
Pt arrives with friends with c/o alcohol intoxication. Per pt, he drinks 1-2 cases of beer a day and the last time he drank was about an hour ago. 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 Pt has bruises all over most of his body from falling. Pt a&ox3.

## 2022-05-23 ENCOUNTER — Encounter (HOSPITAL_COMMUNITY): Payer: Self-pay | Admitting: Family Medicine

## 2022-05-23 DIAGNOSIS — Z87828 Personal history of other (healed) physical injury and trauma: Secondary | ICD-10-CM

## 2022-05-23 DIAGNOSIS — F1010 Alcohol abuse, uncomplicated: Secondary | ICD-10-CM | POA: Diagnosis not present

## 2022-05-23 DIAGNOSIS — R7881 Bacteremia: Secondary | ICD-10-CM | POA: Diagnosis not present

## 2022-05-23 DIAGNOSIS — G4733 Obstructive sleep apnea (adult) (pediatric): Secondary | ICD-10-CM | POA: Diagnosis not present

## 2022-05-23 DIAGNOSIS — F10920 Alcohol use, unspecified with intoxication, uncomplicated: Secondary | ICD-10-CM | POA: Diagnosis not present

## 2022-05-23 DIAGNOSIS — S301XXA Contusion of abdominal wall, initial encounter: Secondary | ICD-10-CM

## 2022-05-23 DIAGNOSIS — A4101 Sepsis due to Methicillin susceptible Staphylococcus aureus: Secondary | ICD-10-CM | POA: Diagnosis not present

## 2022-05-23 DIAGNOSIS — Z79899 Other long term (current) drug therapy: Secondary | ICD-10-CM | POA: Diagnosis not present

## 2022-05-23 DIAGNOSIS — D696 Thrombocytopenia, unspecified: Secondary | ICD-10-CM | POA: Diagnosis not present

## 2022-05-23 DIAGNOSIS — E878 Other disorders of electrolyte and fluid balance, not elsewhere classified: Secondary | ICD-10-CM | POA: Diagnosis not present

## 2022-05-23 DIAGNOSIS — Z1152 Encounter for screening for COVID-19: Secondary | ICD-10-CM | POA: Diagnosis not present

## 2022-05-23 DIAGNOSIS — N390 Urinary tract infection, site not specified: Secondary | ICD-10-CM | POA: Diagnosis not present

## 2022-05-23 DIAGNOSIS — R160 Hepatomegaly, not elsewhere classified: Secondary | ICD-10-CM | POA: Diagnosis not present

## 2022-05-23 DIAGNOSIS — K7030 Alcoholic cirrhosis of liver without ascites: Secondary | ICD-10-CM

## 2022-05-23 DIAGNOSIS — N401 Enlarged prostate with lower urinary tract symptoms: Secondary | ICD-10-CM

## 2022-05-23 DIAGNOSIS — D61818 Other pancytopenia: Secondary | ICD-10-CM

## 2022-05-23 DIAGNOSIS — B9561 Methicillin susceptible Staphylococcus aureus infection as the cause of diseases classified elsewhere: Secondary | ICD-10-CM | POA: Diagnosis not present

## 2022-05-23 DIAGNOSIS — W19XXXA Unspecified fall, initial encounter: Secondary | ICD-10-CM | POA: Diagnosis not present

## 2022-05-23 DIAGNOSIS — S52041A Displaced fracture of coronoid process of right ulna, initial encounter for closed fracture: Secondary | ICD-10-CM | POA: Diagnosis not present

## 2022-05-23 DIAGNOSIS — S40022A Contusion of left upper arm, initial encounter: Secondary | ICD-10-CM | POA: Diagnosis not present

## 2022-05-23 DIAGNOSIS — F10231 Alcohol dependence with withdrawal delirium: Secondary | ICD-10-CM | POA: Diagnosis not present

## 2022-05-23 DIAGNOSIS — R3916 Straining to void: Secondary | ICD-10-CM

## 2022-05-23 DIAGNOSIS — F10939 Alcohol use, unspecified with withdrawal, unspecified: Secondary | ICD-10-CM | POA: Diagnosis not present

## 2022-05-23 DIAGNOSIS — I10 Essential (primary) hypertension: Secondary | ICD-10-CM

## 2022-05-23 DIAGNOSIS — R296 Repeated falls: Secondary | ICD-10-CM | POA: Diagnosis not present

## 2022-05-23 DIAGNOSIS — E119 Type 2 diabetes mellitus without complications: Secondary | ICD-10-CM

## 2022-05-23 DIAGNOSIS — Y908 Blood alcohol level of 240 mg/100 ml or more: Secondary | ICD-10-CM | POA: Diagnosis not present

## 2022-05-23 DIAGNOSIS — K766 Portal hypertension: Secondary | ICD-10-CM | POA: Diagnosis not present

## 2022-05-23 DIAGNOSIS — E876 Hypokalemia: Secondary | ICD-10-CM | POA: Diagnosis not present

## 2022-05-23 DIAGNOSIS — S40021A Contusion of right upper arm, initial encounter: Secondary | ICD-10-CM | POA: Diagnosis not present

## 2022-05-23 DIAGNOSIS — E871 Hypo-osmolality and hyponatremia: Secondary | ICD-10-CM | POA: Diagnosis not present

## 2022-05-23 DIAGNOSIS — F10221 Alcohol dependence with intoxication delirium: Secondary | ICD-10-CM | POA: Diagnosis not present

## 2022-05-23 DIAGNOSIS — R6520 Severe sepsis without septic shock: Secondary | ICD-10-CM | POA: Diagnosis not present

## 2022-05-23 DIAGNOSIS — R0902 Hypoxemia: Secondary | ICD-10-CM | POA: Diagnosis not present

## 2022-05-23 DIAGNOSIS — E8770 Fluid overload, unspecified: Secondary | ICD-10-CM | POA: Diagnosis not present

## 2022-05-23 LAB — CBC WITH DIFFERENTIAL/PLATELET
Abs Immature Granulocytes: 0.01 10*3/uL (ref 0.00–0.07)
Abs Immature Granulocytes: 0.01 10*3/uL (ref 0.00–0.07)
Basophils Absolute: 0 10*3/uL (ref 0.0–0.1)
Basophils Absolute: 0 10*3/uL (ref 0.0–0.1)
Basophils Relative: 0 %
Basophils Relative: 0 %
Eosinophils Absolute: 0 10*3/uL (ref 0.0–0.5)
Eosinophils Absolute: 0 10*3/uL (ref 0.0–0.5)
Eosinophils Relative: 0 %
Eosinophils Relative: 1 %
HCT: 27 % — ABNORMAL LOW (ref 39.0–52.0)
HCT: 27 % — ABNORMAL LOW (ref 39.0–52.0)
Hemoglobin: 9.1 g/dL — ABNORMAL LOW (ref 13.0–17.0)
Hemoglobin: 9.1 g/dL — ABNORMAL LOW (ref 13.0–17.0)
Immature Granulocytes: 0 %
Immature Granulocytes: 0 %
Lymphocytes Relative: 12 %
Lymphocytes Relative: 16 %
Lymphs Abs: 0.4 10*3/uL — ABNORMAL LOW (ref 0.7–4.0)
Lymphs Abs: 0.4 10*3/uL — ABNORMAL LOW (ref 0.7–4.0)
MCH: 27 pg (ref 26.0–34.0)
MCH: 27.5 pg (ref 26.0–34.0)
MCHC: 33.7 g/dL (ref 30.0–36.0)
MCHC: 33.7 g/dL (ref 30.0–36.0)
MCV: 80.1 fL (ref 80.0–100.0)
MCV: 81.6 fL (ref 80.0–100.0)
Monocytes Absolute: 0.5 10*3/uL (ref 0.1–1.0)
Monocytes Absolute: 0.4 10*3/uL (ref 0.1–1.0)
Monocytes Relative: 17 %
Monocytes Relative: 17 %
Neutro Abs: 2 10*3/uL (ref 1.7–7.7)
Neutro Abs: 1.5 10*3/uL — ABNORMAL LOW (ref 1.7–7.7)
Neutrophils Relative %: 71 %
Neutrophils Relative %: 66 %
Platelets: 8 10*3/uL — CL (ref 150–400)
Platelets: 16 10*3/uL — CL (ref 150–400)
RBC: 3.37 MIL/uL — ABNORMAL LOW (ref 4.22–5.81)
RBC: 3.31 MIL/uL — ABNORMAL LOW (ref 4.22–5.81)
RDW: 19.9 % — ABNORMAL HIGH (ref 11.5–15.5)
RDW: 19.9 % — ABNORMAL HIGH (ref 11.5–15.5)
WBC: 2.9 10*3/uL — ABNORMAL LOW (ref 4.0–10.5)
WBC: 2.3 10*3/uL — ABNORMAL LOW (ref 4.0–10.5)
nRBC: 0 % (ref 0.0–0.2)
nRBC: 0 % (ref 0.0–0.2)

## 2022-05-23 LAB — COMPREHENSIVE METABOLIC PANEL
ALT: 100 U/L — ABNORMAL HIGH (ref 0–44)
AST: 127 U/L — ABNORMAL HIGH (ref 15–41)
Albumin: 3.4 g/dL — ABNORMAL LOW (ref 3.5–5.0)
Alkaline Phosphatase: 66 U/L (ref 38–126)
Anion gap: 12 (ref 5–15)
BUN: 5 mg/dL — ABNORMAL LOW (ref 6–20)
CO2: 24 mmol/L (ref 22–32)
Calcium: 7.7 mg/dL — ABNORMAL LOW (ref 8.9–10.3)
Chloride: 95 mmol/L — ABNORMAL LOW (ref 98–111)
Creatinine, Ser: 0.56 mg/dL — ABNORMAL LOW (ref 0.61–1.24)
GFR, Estimated: >60 mL/min (ref >60)
Glucose, Bld: 213 mg/dL — ABNORMAL HIGH (ref 70–99)
Potassium: 3.6 mmol/L (ref 3.5–5.1)
Sodium: 131 mmol/L — ABNORMAL LOW (ref 135–145)
Total Bilirubin: 3.2 mg/dL — ABNORMAL HIGH (ref 0.3–1.2)
Total Protein: 6.2 g/dL — ABNORMAL LOW (ref 6.5–8.1)

## 2022-05-23 LAB — GLUCOSE, CAPILLARY
Glucose-Capillary: 110 mg/dL — ABNORMAL HIGH (ref 70–99)
Glucose-Capillary: 131 mg/dL — ABNORMAL HIGH (ref 70–99)
Glucose-Capillary: 146 mg/dL — ABNORMAL HIGH (ref 70–99)
Glucose-Capillary: 170 mg/dL — ABNORMAL HIGH (ref 70–99)
Glucose-Capillary: 212 mg/dL — ABNORMAL HIGH (ref 70–99)

## 2022-05-23 LAB — VITAMIN B12: Vitamin B-12: 706 pg/mL (ref 180–914)

## 2022-05-23 LAB — CBC
HCT: 28.8 % — ABNORMAL LOW (ref 39.0–52.0)
Hemoglobin: 9.6 g/dL — ABNORMAL LOW (ref 13.0–17.0)
MCH: 27.2 pg (ref 26.0–34.0)
MCHC: 33.3 g/dL (ref 30.0–36.0)
MCV: 81.6 fL (ref 80.0–100.0)
Platelets: 7 10*3/uL — CL (ref 150–400)
RBC: 3.53 MIL/uL — ABNORMAL LOW (ref 4.22–5.81)
RDW: 19.7 % — ABNORMAL HIGH (ref 11.5–15.5)
WBC: 4 10*3/uL (ref 4.0–10.5)
nRBC: 0 % (ref 0.0–0.2)

## 2022-05-23 LAB — RETICULOCYTES
Immature Retic Fract: 22.6 % — ABNORMAL HIGH (ref 2.3–15.9)
RBC.: 3.34 MIL/uL — ABNORMAL LOW (ref 4.22–5.81)
Retic Count, Absolute: 146 10*3/uL (ref 19.0–186.0)
Retic Ct Pct: 4.4 % — ABNORMAL HIGH (ref 0.4–3.1)

## 2022-05-23 LAB — TYPE AND SCREEN
ABO/RH(D): A NEG
Antibody Screen: NEGATIVE

## 2022-05-23 LAB — FERRITIN: Ferritin: 45 ng/mL (ref 24–336)

## 2022-05-23 LAB — IRON AND TIBC
Iron: 29 ug/dL — ABNORMAL LOW (ref 45–182)
Saturation Ratios: 10 % — ABNORMAL LOW (ref 17.9–39.5)
TIBC: 304 ug/dL (ref 250–450)
UIBC: 275 ug/dL

## 2022-05-23 LAB — MRSA NEXT GEN BY PCR, NASAL: MRSA by PCR Next Gen: NOT DETECTED

## 2022-05-23 LAB — FOLATE: Folate: 39.7 ng/mL (ref >5.9)

## 2022-05-23 LAB — MAGNESIUM: Magnesium: 2.1 mg/dL (ref 1.7–2.4)

## 2022-05-23 MED ORDER — CHLORDIAZEPOXIDE HCL 25 MG PO CAPS
25.0000 mg | ORAL_CAPSULE | Freq: Four times a day (QID) | ORAL | Status: AC
  Administered 2022-05-23 (×4): 25 mg via ORAL
  Filled 2022-05-23 (×4): qty 1

## 2022-05-23 MED ORDER — ONDANSETRON HCL 4 MG/2ML IJ SOLN: 4.0000 mg | Freq: Four times a day (QID) | INTRAMUSCULAR | Status: DC | PRN

## 2022-05-23 MED ORDER — LOPERAMIDE HCL 2 MG PO CAPS: 2.0000 mg | ORAL_CAPSULE | ORAL | Status: AC | PRN

## 2022-05-23 MED ORDER — ORAL CARE MOUTH RINSE: 15.0000 mL | OROMUCOSAL | Status: DC | PRN

## 2022-05-23 MED ORDER — ADULT MULTIVITAMIN W/MINERALS CH: 1.0000 | ORAL_TABLET | Freq: Every day | ORAL | Status: DC

## 2022-05-23 MED ORDER — CHLORHEXIDINE GLUCONATE CLOTH 2 % EX PADS
6.0000 | MEDICATED_PAD | Freq: Every day | CUTANEOUS | Status: DC
  Administered 2022-05-23 – 2022-05-31 (×10): 6 via TOPICAL

## 2022-05-23 MED ORDER — SODIUM CHLORIDE 0.9% IV SOLUTION: Freq: Once | INTRAVENOUS | Status: AC

## 2022-05-23 MED ORDER — HYDRALAZINE HCL 20 MG/ML IJ SOLN
10.0000 mg | Freq: Four times a day (QID) | INTRAMUSCULAR | Status: DC | PRN
  Filled 2022-05-23: qty 1

## 2022-05-23 MED ORDER — FUROSEMIDE 20 MG PO TABS
20.0000 mg | ORAL_TABLET | Freq: Every day | ORAL | Status: DC
  Administered 2022-05-24 – 2022-05-31 (×8): 20 mg via ORAL
  Filled 2022-05-23 (×8): qty 1

## 2022-05-23 MED ORDER — AMLODIPINE BESYLATE 10 MG PO TABS
10.0000 mg | ORAL_TABLET | Freq: Every day | ORAL | Status: DC
  Administered 2022-05-24 – 2022-05-28 (×5): 10 mg via ORAL
  Filled 2022-05-23 (×5): qty 1

## 2022-05-23 MED ORDER — MIRTAZAPINE 15 MG PO TABS
15.0000 mg | ORAL_TABLET | Freq: Every day | ORAL | Status: DC
  Administered 2022-05-23 – 2022-05-30 (×7): 15 mg via ORAL
  Filled 2022-05-23 (×7): qty 1

## 2022-05-23 MED ORDER — INSULIN ASPART 100 UNIT/ML IJ SOLN
0.0000 [IU] | Freq: Three times a day (TID) | INTRAMUSCULAR | Status: DC
  Administered 2022-05-23: 2 [IU] via SUBCUTANEOUS
  Administered 2022-05-23 – 2022-05-24 (×2): 3 [IU] via SUBCUTANEOUS
  Administered 2022-05-24: 5 [IU] via SUBCUTANEOUS
  Administered 2022-05-24 – 2022-05-25 (×3): 3 [IU] via SUBCUTANEOUS
  Administered 2022-05-25: 2 [IU] via SUBCUTANEOUS
  Administered 2022-05-26: 3 [IU] via SUBCUTANEOUS
  Administered 2022-05-26: 2 [IU] via SUBCUTANEOUS
  Administered 2022-05-26 – 2022-05-27 (×4): 3 [IU] via SUBCUTANEOUS
  Administered 2022-05-28: 2 [IU] via SUBCUTANEOUS
  Administered 2022-05-28: 3 [IU] via SUBCUTANEOUS
  Administered 2022-05-28: 8 [IU] via SUBCUTANEOUS
  Administered 2022-05-29: 5 [IU] via SUBCUTANEOUS
  Administered 2022-05-29 (×2): 3 [IU] via SUBCUTANEOUS
  Administered 2022-05-30 (×2): 5 [IU] via SUBCUTANEOUS
  Administered 2022-05-30: 3 [IU] via SUBCUTANEOUS
  Administered 2022-05-31: 5 [IU] via SUBCUTANEOUS
  Administered 2022-05-31: 3 [IU] via SUBCUTANEOUS

## 2022-05-23 MED ORDER — TAMSULOSIN HCL 0.4 MG PO CAPS
0.4000 mg | ORAL_CAPSULE | Freq: Every day | ORAL | Status: DC
  Administered 2022-05-23 – 2022-05-30 (×8): 0.4 mg via ORAL
  Filled 2022-05-23 (×8): qty 1

## 2022-05-23 MED ORDER — DICLOFENAC SODIUM 1 % EX GEL
2.0000 g | Freq: Four times a day (QID) | CUTANEOUS | Status: DC
  Administered 2022-05-23 – 2022-05-31 (×24): 2 g via TOPICAL
  Filled 2022-05-23: qty 100

## 2022-05-23 MED ORDER — ONDANSETRON 4 MG PO TBDP: 4.0000 mg | ORAL_TABLET | Freq: Four times a day (QID) | ORAL | Status: AC | PRN

## 2022-05-23 MED ORDER — AMLODIPINE BESYLATE 5 MG PO TABS
5.0000 mg | ORAL_TABLET | Freq: Once | ORAL | Status: AC
  Administered 2022-05-23: 5 mg via ORAL
  Filled 2022-05-23: qty 1

## 2022-05-23 MED ORDER — ORAL CARE MOUTH RINSE: 15.0000 mL | OROMUCOSAL | Status: DC

## 2022-05-23 MED ORDER — THIAMINE HCL 100 MG/ML IJ SOLN: 100.0000 mg | Freq: Once | INTRAMUSCULAR | Status: DC

## 2022-05-23 MED ORDER — CHLORDIAZEPOXIDE HCL 25 MG PO CAPS
25.0000 mg | ORAL_CAPSULE | ORAL | Status: AC
  Administered 2022-05-25: 25 mg via ORAL
  Filled 2022-05-23: qty 1

## 2022-05-23 MED ORDER — PANTOPRAZOLE SODIUM 40 MG IV SOLR
40.0000 mg | Freq: Two times a day (BID) | INTRAVENOUS | Status: DC
  Administered 2022-05-23 – 2022-05-30 (×16): 40 mg via INTRAVENOUS
  Filled 2022-05-23 (×16): qty 10

## 2022-05-23 MED ORDER — HYDROXYZINE HCL 25 MG PO TABS
25.0000 mg | ORAL_TABLET | Freq: Four times a day (QID) | ORAL | Status: AC | PRN
  Administered 2022-05-23 (×2): 25 mg via ORAL
  Filled 2022-05-23 (×2): qty 1

## 2022-05-23 MED ORDER — OXYCODONE HCL 5 MG PO TABS
5.0000 mg | ORAL_TABLET | Freq: Four times a day (QID) | ORAL | Status: DC | PRN
  Administered 2022-05-23 – 2022-05-24 (×3): 5 mg via ORAL
  Filled 2022-05-23 (×3): qty 1

## 2022-05-23 MED ORDER — FUROSEMIDE 10 MG/ML IJ SOLN
20.0000 mg | Freq: Once | INTRAMUSCULAR | Status: AC
  Administered 2022-05-23: 20 mg via INTRAVENOUS
  Filled 2022-05-23: qty 2

## 2022-05-23 MED ORDER — CHLORDIAZEPOXIDE HCL 25 MG PO CAPS
25.0000 mg | ORAL_CAPSULE | Freq: Three times a day (TID) | ORAL | Status: AC
  Administered 2022-05-24 (×3): 25 mg via ORAL
  Filled 2022-05-23 (×3): qty 1

## 2022-05-23 MED ORDER — INSULIN ASPART 100 UNIT/ML IJ SOLN
0.0000 [IU] | Freq: Every day | INTRAMUSCULAR | Status: DC
  Administered 2022-05-28: 2 [IU] via SUBCUTANEOUS
  Administered 2022-05-29: 3 [IU] via SUBCUTANEOUS
  Administered 2022-05-30: 2 [IU] via SUBCUTANEOUS

## 2022-05-23 MED ORDER — TRAMADOL HCL 50 MG PO TABS
100.0000 mg | ORAL_TABLET | Freq: Four times a day (QID) | ORAL | Status: DC | PRN
  Administered 2022-05-23 – 2022-05-30 (×12): 100 mg via ORAL
  Filled 2022-05-23 (×13): qty 2

## 2022-05-23 MED ORDER — ONDANSETRON HCL 4 MG PO TABS: 4.0000 mg | ORAL_TABLET | Freq: Four times a day (QID) | ORAL | Status: DC | PRN

## 2022-05-23 MED ORDER — AMLODIPINE BESYLATE 5 MG PO TABS
5.0000 mg | ORAL_TABLET | Freq: Every day | ORAL | Status: DC
  Administered 2022-05-23: 5 mg via ORAL
  Filled 2022-05-23: qty 1

## 2022-05-23 MED ORDER — SPIRONOLACTONE 25 MG PO TABS
25.0000 mg | ORAL_TABLET | Freq: Every day | ORAL | Status: DC
  Administered 2022-05-23 – 2022-05-31 (×9): 25 mg via ORAL
  Filled 2022-05-23 (×9): qty 1

## 2022-05-23 MED ORDER — ACETAMINOPHEN 325 MG PO TABS
650.0000 mg | ORAL_TABLET | Freq: Four times a day (QID) | ORAL | Status: DC | PRN
  Administered 2022-05-23 – 2022-05-26 (×5): 650 mg via ORAL
  Filled 2022-05-23 (×5): qty 2

## 2022-05-23 MED ORDER — CHLORDIAZEPOXIDE HCL 25 MG PO CAPS
25.0000 mg | ORAL_CAPSULE | Freq: Every day | ORAL | Status: AC
  Administered 2022-05-26: 25 mg via ORAL
  Filled 2022-05-23: qty 1

## 2022-05-23 MED ORDER — CHLORDIAZEPOXIDE HCL 25 MG PO CAPS: 25.0000 mg | ORAL_CAPSULE | Freq: Four times a day (QID) | ORAL | Status: AC | PRN

## 2022-05-23 NOTE — Assessment & Plan Note (Addendum)
Glucose is improved - Continue sliding scale corrections

## 2022-05-23 NOTE — Assessment & Plan Note (Addendum)
Supplemented and resolved 

## 2022-05-23 NOTE — H&P (Signed)
History and Physical    Patient: [name] DOB: Jul 04, 1970 DOA: 05/22/2022 DOS: the patient was seen and examined on 05/23/2022 PCP: [name], MD  Patient coming from: Home  Chief Complaint:  Chief Complaint  Patient presents with   Alcohol Problem   HPI: [name] is a 52 y.o. male with medical history significant of ongoing EtOH abuse, cirrhosis, HTN, DM2, portal hypertension with pancytopenia.  Pt in to ED at med center with c/o recurrent falls in setting of ongoing drinking.  He cannot tell me how much she is drinking. He does want to stop drinking and he said he has been here before and we prescribed him Librium which helped. He has various bruises over his body including a large bruise on his right flank. He thinks this is probably from falling.   Review of Systems: As mentioned in the history of present illness. All other systems reviewed and are negative. Past Medical History:  Diagnosis Date   Alcoholic ([place])    Anxiety    Diabetes ([place])    DVT (deep venous thrombosis) ([place])    Hypertension    Past Surgical History:  Procedure Laterality Date   BLOOD PATCH     ESOPHAGOGASTRODUODENOSCOPY N/A 12/26/2021   Procedure: ESOPHAGOGASTRODUODENOSCOPY (EGD);  Surgeon: Ronnette Juniper, MD;  Location: [facility];  Service: Gastroenterology;  Laterality: N/A;   TONSILLECTOMY     Social History:  reports that he has never smoked. He has never used smokeless tobacco. He reports current alcohol use of about 100.0 standard drinks of alcohol per week. He reports that he does not use drugs.  No Known Allergies  Family History  Problem Relation Age of Onset   Hypertension Father     Prior to Admission medications   Medication Sig Start Date End Date Taking? Authorizing Provider  fexofenadine-pseudoephedrine (ALLEGRA-D) 60-120 MG 12 hr tablet Take 1 tablet by mouth 2 (two) times daily. 04/03/22 04/03/23  [name], MD  folic acid (FOLVITE) 1 MG tablet  TAKE 1 TABLET BY MOUTH EVERY DAY 05/05/22   [name], MD  folic acid (FOLVITE) 1 MG tablet Take 1 mg by mouth daily. 04/07/22   [provider]  metFORMIN (GLUCOPHAGE) 500 MG tablet Take 1 tablet (500 mg total) by mouth 2 (two) times daily with a meal. 09/23/21   Dykstra, Ellwood Dense, MD  mirtazapine (REMERON) 15 MG tablet Take 1 tablet (15 mg total) by mouth at bedtime. 04/07/22   [name], MD  pantoprazole (PROTONIX) 40 MG tablet Take 1 tablet (40 mg total) by mouth 2 (two) times daily before a meal. 12/28/21 03/28/22  Dahal, Marlowe Aschoff, MD  tamsulosin (FLOMAX) 0.4 MG CAPS capsule Take 1 capsule (0.4 mg total) by mouth daily after supper. 04/03/22   [name], MD    Physical Exam: Vitals:   05/23/22 0200 05/23/22 0300 05/23/22 0400 05/23/22 0500  BP: (!) 176/78 (!) 176/86 (!) 176/90 (!) 161/72  Pulse: (!) 118 (!) 106 (!) 110 96  Resp: '15 13 14 '$ (!) 22  Temp:   98.3 F (36.8 C)   TempSrc:   Oral   SpO2: 96% 96% 96% 96%  Weight:      Height:       Constitutional: NAD, calm, comfortable Eyes: R pupil dilated markedly compared to left, this is chronic per pt since injury several months ago (looks like March 2023 from records actually), seeing eye doctor.   Respiratory: clear to auscultation bilaterally, no wheezing, no crackles. Normal  respiratory effort. No accessory muscle use.  Cardiovascular: Regular rate and rhythm, no murmurs / rubs / gallops. No extremity edema. 2+ pedal pulses. No carotid bruits.  Abdomen: no tenderness, no masses palpated. No hepatosplenomegaly. Bowel sounds positive.  Skin: Bruise on R flank Neurologic: CN 2-12 grossly intact. Sensation intact, DTR normal. Strength 5/5 in all 4.  Psychiatric: Normal judgment and insight. Alert and oriented x 3. Normal mood.   Data Reviewed:    BAL 415     Latest Ref Rng & Units 05/23/2022    3:48 AM 05/22/2022    7:01 PM 04/03/2022   11:17 AM  CBC  WBC 4.0 - 10.5 K/uL 4.0  6.0  2.9   Hemoglobin 13.0 - 17.0 g/dL 9.6  10.6   13.6   Hematocrit 39.0 - 52.0 % 28.8  30.3  43.0   Platelets 150 - 400 K/uL 7  9  54       Latest Ref Rng & Units 05/23/2022    3:48 AM 05/22/2022    7:01 PM 03/10/2022    7:36 AM  CMP  Glucose 70 - 99 mg/dL 213  210  205   BUN 6 - 20 mg/dL 5  <5  <5   Creatinine 0.61 - 1.24 mg/dL 0.56  0.49  0.53   Sodium 135 - 145 mmol/L 131  124  140   Potassium 3.5 - 5.1 mmol/L 3.6  3.6  3.7   Chloride 98 - 111 mmol/L 95  89  102   CO2 22 - 32 mmol/L '24  19  23   '$ Calcium 8.9 - 10.3 mg/dL 7.7  7.9  8.6   Total Protein 6.5 - 8.1 g/dL 6.2  6.6  7.5   Total Bilirubin 0.3 - 1.2 mg/dL 3.2  3.1  2.0   Alkaline Phos 38 - 126 U/L 66  80  108   AST 15 - 41 U/L 127  153  207   ALT 0 - 44 U/L 100  114  147      Assessment and Plan: * Other pancytopenia (HCC) Pt with chronic pancytopenia and severe thrombocytopenia that is believed to be "secondary to splenomegaly and alcohol abuse " as per oncologist Dr. Krista Blue note in Oct 2023. Monitor CBC daily. Will transfuse 1u platelets given platelets < 10k due to risk of spontaneous bleeding Repeat CBC post transfusion Day team want to curbside oncology this AM to make sure they are still thinking that this is all just due to portal HTN, splenomegaly, and EtOH abuse and not something like ITP.  Liver mass 4.4cm (previously 4.6cm) heterogenous lesion of anterior R lobe of liver.  Biopsy in Oct 2023 was neg for [facility] (just showed primarily necrotic tissue). Doesn't look to have enlarged on todays CT scan (actually was 4.6cm previously, 4.4cm today). Also has 1.6cm hypervascular lesion of L lobe of liver.  Looks like this was 1.0cm on prior CT, but they didn't mention it at all on MRI previously. 4.4cm lesion with neg pathology, doesn't seem to be enlarging on serial imaging exams. 1.6cm hypervascular lesion of L lobe of liver of unclear significance Would just have pt follow up with heme/onc as outpt at this point.  Cirrhosis of liver (HCC) Pt with EtOH cirrhosis of  liver, unfortunately continues to drink heavily. Mild transaminitis that looks to be baseline. Doesn't seem to be on any diuretics chronically, no lactulose, etc.  Alcohol abuse Pt with EtOH intoxication today. On CIWA for when he develops withdrawal  symptoms.  History of eye trauma Pt has h/o traumatic injury to R eye in March 2023, with chronic dilated pupil and very poor vision in that eye ever since.  Not on any eye-drops chronically.  He confirms that none of this is new or changed recently though and review of 06/01/21 ED visit note and 11/26/21 optho appointment note seem to confirm that this is a chronic issue.  DM (diabetes mellitus), type 2 (HCC) Mod scale SSI AC/HS  Hypomagnesemia Got Mg in ED Repeat this AM is wnl.      Advance Care Planning:   Code Status: Full Code  Consults: None  Family Communication: No family in room  Severity of Illness: The appropriate patient status for this patient is INPATIENT. Inpatient status is judged to be reasonable and necessary in order to provide the required intensity of service to ensure the patient's safety. The patient's presenting symptoms, physical exam findings, and initial radiographic and laboratory data in the context of their chronic comorbidities is felt to place them at high risk for further clinical deterioration. Furthermore, it is not anticipated that the patient will be medically stable for discharge from the hospital within 2 midnights of admission.   * I certify that at the point of admission it is my clinical judgment that the patient will require inpatient hospital care spanning beyond 2 midnights from the point of admission due to high intensity of service, high risk for further deterioration and high frequency of surveillance required.*  Author: Etta Quill., DO 05/23/2022 5:53 AM  For on call review [email or web address].

## 2022-05-23 NOTE — Progress Notes (Signed)
PROGRESS NOTE    [name]  [id number] DOB: 10-Nov-1970 DOA: 05/22/2022 PCP: [name], MD    Chief Complaint  Patient presents with   Alcohol Problem    Brief Narrative:  HPI per Dr. Mylo Red Dyle Lewis is a 52 y.o. male with medical history significant of ongoing EtOH abuse, cirrhosis, HTN, DM2, portal hypertension with pancytopenia.   Pt in to ED at med center with c/o recurrent falls in setting of ongoing drinking.  He cannot tell me how much she is drinking. He does want to stop drinking and he said he has been here before and we prescribed him Librium which helped. He has various bruises over his body including a large bruise on his right flank. He thinks this is probably from falling.    Assessment & Plan:  Principal Problem:   Other pancytopenia ([place]) Active Problems:   Alcohol abuse   Cirrhosis of liver (HCC)   Liver mass   Hypomagnesemia   DM (diabetes mellitus), type 2 (HCC)   History of eye trauma   Hypertension   Benign prostatic hyperplasia (BPH) with straining on urination   Acute alcoholic intoxication without complication (HCC)   Hematoma of right flank    Assessment and Plan: * Other pancytopenia (HCC) Pt with chronic pancytopenia and severe thrombocytopenia that is believed to be "secondary to splenomegaly and alcohol abuse " as per oncologist Dr. Burr Medico note in Oct 2023. -Platelet count noted at 7 this morning. -Patient currently receiving a unit of platelets. -Patient with multiple areas of ecchymosis/bruising however denies any overt bleeding. -Check an anemia panel. -Place on IV PPI twice daily. -Repeat CBC posttransfusion of platelets. -Consult with hematology/oncology for further evaluation and management.  Liver mass 4.4cm (previously 4.6cm) heterogenous lesion of anterior R lobe of liver.  Biopsy in Oct 2023 was neg for [facility] (just showed primarily necrotic tissue). Doesn't look to have enlarged on todays CT scan  (actually was 4.6cm previously, 4.4cm today). Also has 1.6cm hypervascular lesion of L lobe of liver.  Looks like this was 1.0cm on prior CT, but they didn't mention it at all on MRI previously. 4.4cm lesion with neg pathology, doesn't seem to be enlarging on serial imaging exams. 1.6cm hypervascular lesion of L lobe of liver of unclear significance Hematology/oncology consulted and will assess patient later on today.  Cirrhosis of liver (HCC) Pt with EtOH cirrhosis of liver, unfortunately continues to drink heavily. Mild transaminitis that looks to be baseline. Doesn't seem to be on any diuretics chronically, no lactulose, etc. -Follow. -Will need outpatient follow-up with GI postdischarge.  Alcohol abuse Pt with EtOH intoxication on presentation noted to have alcohol level of 415.   -Patient states drinks about 20 beers daily and has been drinking since age 42.   -Patient states he is ready for alcohol cessation and will not drink again posthospitalization.   -Placed on the Librium detox protocol.   -Continue Ativan CIWA protocol.   -Continue thiamine, folic acid, multivitamin.   History of eye trauma Pt has h/o traumatic injury to R eye in March 2023, with chronic dilated pupil and very poor vision in that eye ever since.  Not on any eye-drops chronically.  He confirms that none of this is new or changed recently though and review of 06/01/21 ED visit note and 11/26/21 optho appointment note seem to confirm that this is a chronic issue.  DM (diabetes mellitus), type 2 (HCC) - Hemoglobin A1c 7.9  (04/03/2022 ) -CBG 170 this  morning.   -Continue sliding scale insulin.    Hypomagnesemia - Mag level noted at 1.6 on presentation, noted to have received magnesium in the ED.   -Repeat magnesium level at 2.1.   -Follow.  Hematoma of right flank -Patient with large hematoma of the right flank area complaining of pain. -Secondary to fall in the setting of pancytopenia with severe  thrombocytopenia. -Warm compresses. -Pain management.  Benign prostatic hyperplasia (BPH) with straining on urination -Resume home regimen of Flomax.  Hypertension Patient with elevated blood pressure likely component of alcohol abuse/withdrawal. -Start Norvasc 10 mg daily. -IV hydralazine as needed.         DVT prophylaxis: SCDs Code Status: Full Family Communication: Updated patient.  No family at bedside. Disposition: Likely home when clinically improved.  Status is: Inpatient Remains inpatient appropriate because: Severity of illness   Consultants:  Hematology/oncology pending  Procedures:  CT abdomen and pelvis 05/22/2022  Antimicrobials:  None   Subjective: Laying in bed.  Receiving platelet transfusion.  No chest pain.  No shortness of breath.  No abdominal pain.  Complaining of right lower back pain.  Denies any overt bleeding.  No dysuria.  No rectal bleeding.  States he is ready to quit alcohol drinking and his last drink was prior to coming to the hospital.  Objective: Vitals:   05/23/22 0650 05/23/22 0700 05/23/22 0800 05/23/22 0900  BP: (!) 160/78 (!) 158/80 (!) 169/73 (!) 178/80  Pulse: 91 91 96 (!) 104  Resp: '18 18 17 16  '$ Temp: 98.2 F (36.8 C)     TempSrc: Oral     SpO2: 95% 95% 94% 97%  Weight:      Height:        Intake/Output Summary (Last 24 hours) at 05/23/2022 0955 Last data filed at 05/23/2022 0135 Gross per 24 hour  Intake 2050.04 ml  Output 3275 ml  Net -1224.96 ml   Filed Weights   05/22/22 1833 05/23/22 0042  Weight: 72.6 kg 70.1 kg    Examination:  General exam: Appears calm and comfortable.  Minimal tremors. Respiratory system: Clear to auscultation.  No wheezes, no crackles, no rhonchi.  Fair air movement.  Speaking in full sentences.  Respiratory effort normal. Cardiovascular system: Tachycardia.  No JVD, no murmurs rubs or gallops.  No lower extremity edema.  Gastrointestinal system: Abdomen is nondistended, soft and  nontender. No organomegaly or masses felt. Normal bowel sounds heard. Central nervous system: Alert and oriented. No focal neurological deficits. Extremities: Symmetric 5 x 5 power. Skin: Multiple areas of ecchymosis over his skin including large area of ecchymosis in the right flank/lower back region.  Left upper extremity ecchymosis.  Left lower extremity ecchymosis.  Psychiatry: Judgement and insight appear normal. Mood & affect appropriate.     Data Reviewed:   CBC: Recent Labs  Lab 05/22/22 1901 05/23/22 0348  WBC 6.0 4.0  NEUTROABS 4.8  --   HGB 10.6* 9.6*  HCT 30.3* 28.8*  MCV 77.5* 81.6  PLT 9* 7*    Basic Metabolic Panel: Recent Labs  Lab 05/22/22 1901 05/23/22 0348  NA 124* 131*  K 3.6 3.6  CL 89* 95*  CO2 19* 24  GLUCOSE 210* 213*  BUN <5* 5*  CREATININE 0.49* 0.56*  CALCIUM 7.9* 7.7*  MG 1.6* 2.1    GFR: Estimated Creatinine Clearance: 99.8 mL/min (A) (by C-G formula based on SCr of 0.56 mg/dL (L)).  Liver Function Tests: Recent Labs  Lab 05/22/22 1901 05/23/22 0348  AST  153* 127*  ALT 114* 100*  ALKPHOS 80 66  BILITOT 3.1* 3.2*  PROT 6.6 6.2*  ALBUMIN 3.8 3.4*    CBG: Recent Labs  Lab 05/22/22 2222 05/23/22 0046 05/23/22 0820  GLUCAP 210* 212* 170*     Recent Results (from the past 240 hour(s))  MRSA Next Gen by PCR, Nasal     Status: None   Collection Time: 05/23/22 12:26 AM   Specimen: Nasal Mucosa; Nasal Swab  Result Value Ref Range Status   MRSA by PCR Next Gen NOT DETECTED NOT DETECTED Final    Comment: (NOTE) The GeneXpert MRSA Assay (FDA approved for NASAL specimens only), is one component of a comprehensive MRSA colonization surveillance program. It is not intended to diagnose MRSA infection nor to guide or monitor treatment for MRSA infections. Test performance is not FDA approved in patients less than 82 years old. Performed at [facility], [place] [address], [place], [place] [address]           Radiology Studies: CT Abdomen Pelvis W Contrast  Result Date: 05/22/2022 CLINICAL DATA:  Abdominal pain, right flank hematoma. Multiple bruises on body from following. EXAM: CT ABDOMEN AND PELVIS WITH CONTRAST TECHNIQUE: Multidetector CT imaging of the abdomen and pelvis was performed using the standard protocol following bolus administration of intravenous contrast. RADIATION DOSE REDUCTION: This exam was performed according to the departmental dose-optimization program which includes automated exposure control, adjustment of the mA and/or kV according to patient size and/or use of iterative reconstruction technique. CONTRAST:  127m OMNIPAQUE IOHEXOL 300 MG/ML  SOLN COMPARISON:  08/06/2012, 12/25/2021. FINDINGS: Lower chest: The heart is normal in size and coronary artery calcifications are noted. The lung bases are clear. Hepatobiliary: The liver has a nodular contour, compatible with underlying cirrhosis. Fatty infiltration of the liver is noted. There is a circumscribed heterogenous mass in the anterior right lobe of the liver measuring 4.4 cm. There is a 1.6 cm hypervascular focus in the left lobe of the liver, hepatic segment 4B. No biliary ductal dilatation. The gallbladder is without stones. Pancreas: Unremarkable. No pancreatic ductal dilatation or surrounding inflammatory changes. Spleen: Spleen is enlarged measuring 17.3 cm.  No focal abnormality. Adrenals/Urinary Tract: No adrenal nodule or mass. The kidney enhance symmetrically. No renal calculus or hydronephrosis. The bladder is unremarkable. Stomach/Bowel: Moderate hiatal hernia. Esophageal varices are noted. No obstruction, free air or pneumatosis. Appendix appears normal. No focal bowel wall thickening or surrounding inflammatory changes. Vascular/Lymphatic: Aortic atherosclerosis. The portal vein, splenic vein, and superior mesenteric vein are patent. Esophageal varices are present. A few scattered varices are noted in the mid right  abdomen. No abdominal or pelvic lymphadenopathy by size criteria. Reproductive: Prostate is unremarkable. Other: A trace amount of ascites is noted in the pelvis on the right. Musculoskeletal: No acute or suspicious osseous abnormality. Subcutaneous fat stranding is present over the right flank, compatible with known contusion. IMPRESSION: 1. Subcutaneous fat stranding over the right flank, compatible with history of contusion. No acute fracture is seen. 2. Morphologic changes of cirrhosis and portal hypertension with hepatic steatosis. 3. Heterogeneous mass in the anterior right lobe of the liver measuring 4.4 cm and hypervascular lesion in the left lobe of the liver measuring 1.6 cm, previously characterized by MRI as concerning for hepatocellular carcinoma. Correlation with prior biopsy results is recommended. 4. Moderate hiatal hernia. 5. Aortic atherosclerosis and coronary artery calcifications. Electronically Signed   By: LBrett FairyM.D.   On: 05/22/2022 21:28   CT  Head Wo Contrast  Result Date: 05/22/2022 CLINICAL DATA:  Mental status change, unknown cause EXAM: CT HEAD WITHOUT CONTRAST TECHNIQUE: Contiguous axial images were obtained from the base of the skull through the vertex without intravenous contrast. RADIATION DOSE REDUCTION: This exam was performed according to the departmental dose-optimization program which includes automated exposure control, adjustment of the mA and/or kV according to patient size and/or use of iterative reconstruction technique. COMPARISON:  Head CT 03/10/2022 FINDINGS: Brain: No intracranial hemorrhage, mass effect, or midline shift. No hydrocephalus. The basilar cisterns are patent. Stable chronic small vessel ischemic change. Stable atrophy. No evidence of territorial infarct or acute ischemia. No extra-axial or intracranial fluid collection. Vascular: Atherosclerosis of skullbase vasculature without hyperdense vessel or abnormal calcification. Skull: No fracture or  focal lesion. Sinuses/Orbits: No acute findings. Chronic opacification of a few left mastoid air cells. Other: None. IMPRESSION: 1. No acute intracranial abnormality. 2. Stable atrophy and chronic small vessel ischemic change. Electronically Signed   By: Keith Rake M.D.   On: 05/22/2022 21:08   DG Chest Port 1 View  Result Date: 05/22/2022 CLINICAL DATA:  Weakness EXAM: PORTABLE CHEST 1 VIEW COMPARISON:  12/24/2021 FINDINGS: The heart size and mediastinal contours are within normal limits. Both lungs are clear. The visualized skeletal structures are unremarkable. IMPRESSION: No active disease. Electronically Signed   By: Fidela Salisbury M.D.   On: 05/22/2022 19:08        Scheduled Meds:  amLODipine  5 mg Oral Daily   chlordiazePOXIDE  25 mg Oral QID   Followed by   Derrill Memo ON 05/24/2022] chlordiazePOXIDE  25 mg Oral TID   Followed by   Derrill Memo ON 05/25/2022] chlordiazePOXIDE  25 mg Oral BH-qamhs   Followed by   Derrill Memo ON 05/26/2022] chlordiazePOXIDE  25 mg Oral Daily   Chlorhexidine Gluconate Cloth  6 each Topical Daily   folic acid  1 mg Oral Daily   insulin aspart  0-15 Units Subcutaneous TID WC   insulin aspart  0-5 Units Subcutaneous QHS   mirtazapine  15 mg Oral QHS   multivitamin with minerals  1 tablet Oral Daily   pantoprazole (PROTONIX) IV  40 mg Intravenous Q12H   tamsulosin  0.4 mg Oral QPC supper   thiamine  100 mg Oral Daily   Or   thiamine  100 mg Intravenous Daily   Continuous Infusions:   LOS: 0 days    Time spent: 40 minutes    Irine Seal, MD [facility]   To contact the attending provider between 7A-7P or the covering provider during after hours 7P-7A, please log into the web site [email or web address] and access using universal [place] password for that web site. If you do not have the password, please call the hospital operator.  05/23/2022, 9:55 AM

## 2022-05-23 NOTE — Assessment & Plan Note (Addendum)
Stable 4.4cm liver mass.  Biopsied in Oct 2023, necrotic cells.  Repeat biopsy precluded by platelets.   - Will consider repeat MRI liver prior to discharge  - Needs GI and Heme/Onc follow up

## 2022-05-23 NOTE — Assessment & Plan Note (Addendum)
Anemia, normocytic Leukopenia, neutropenia Severe chronic thrombocytopenia Chronic.  Due to alcohol, probably worse in setting of bacteremia, alcohol poisoning.  Heme consulted, transfused 2 units of platelets 05/23/2022, 1u 3/2, 1u 3/3.  Since then, no clinical bleeding, Plts >20.   ANC down to 500 today. - Continue PPI - Transfuse for platelet count < 20K due to bacteremia - Consult Hematology, appreciate expertise

## 2022-05-23 NOTE — Progress Notes (Signed)
[name]   DOB:04-Sep-1970   [id number]   [id number]  Hem/onc follow up   Subjective: Patient is known to me, I saw him last time in the hospital in 12/2021 and called him after his hospital discharge.  However he had a no-show for multiple follow-up appointment with me in the office.  He presented to hospital due to recurrent falls at home.  Per his wife at the bedside, he quitted alcohol multiple times in the past, but started drinking again about a month ago.  He noticed recurrent epistaxis, and easy bruising on his arms in the past few weeks, he developed significant large area of ecchymosis on the right side abdomen/back after a fall at home.  He was found to have platelet count 9 in the ED, he received 1 unit platelet transfusion this morning, repeated platelet was 8K this afternoon, so he is receiving second unit platelet transfusion.   Objective:  Vitals:   05/23/22 1556 05/23/22 1643  BP: (!) 156/78   Pulse: 87   Resp: 12   Temp: 98.6 F (37 C) 98.4 F (36.9 C)  SpO2: 98%     Body mass index is 24.2 kg/m.  Intake/Output Summary (Last 24 hours) at 05/23/2022 1708 Last data filed at 05/23/2022 1517 Gross per 24 hour  Intake 2460.04 ml  Output 3275 ml  Net -814.96 ml     Sclerae unicteric  Oropharynx clear  No peripheral adenopathy  Lungs clear -- no rales or rhonchi  Heart regular rate and rhythm  Abdomen benign  MSK no focal spinal tenderness, no peripheral edema, (+) extensive ecchymosis in the right back/abdomen   Neuro nonfocal   CBG (last 3)  Recent Labs    05/23/22 0820 05/23/22 1204 05/23/22 1640  GLUCAP 170* 110* 131*     Labs:  Lab Results  Component Value Date   WBC 2.9 (L) 05/23/2022   HGB 9.1 (L) 05/23/2022   HCT 27.0 (L) 05/23/2022   MCV 80.1 05/23/2022   PLT 8 (LL) 05/23/2022   NEUTROABS 2.0 05/23/2022    '@LASTCHEMISTRY'$ @  Urine Studies No results for input(s): "UHGB", "CRYS" in the last 72 hours.  Invalid input(s):  "UACOL", "UAPR", "USPG", "UPH", "UTP", "UGL", "UKET", "UBIL", "UNIT", "UROB", "ULEU", "UEPI", "UWBC", "URBC", "UBAC", "CAST", "UCOM", "BILUA"  Basic Metabolic Panel: Recent Labs  Lab 05/22/22 1901 05/23/22 0348  NA 124* 131*  K 3.6 3.6  CL 89* 95*  CO2 19* 24  GLUCOSE 210* 213*  BUN <5* 5*  CREATININE 0.49* 0.56*  CALCIUM 7.9* 7.7*  MG 1.6* 2.1   GFR Estimated Creatinine Clearance: 99.8 mL/min (A) (by C-G formula based on SCr of 0.56 mg/dL (L)). Liver Function Tests: Recent Labs  Lab 05/22/22 1901 05/23/22 0348  AST 153* 127*  ALT 114* 100*  ALKPHOS 80 66  BILITOT 3.1* 3.2*  PROT 6.6 6.2*  ALBUMIN 3.8 3.4*   Recent Labs  Lab 05/22/22 1901  LIPASE 76*   No results for input(s): "AMMONIA" in the last 168 hours. Coagulation profile Recent Labs  Lab 05/22/22 1901  INR 1.3*    CBC: Recent Labs  Lab 05/22/22 1901 05/23/22 0348 05/23/22 1200  WBC 6.0 4.0 2.9*  NEUTROABS 4.8  --  2.0  HGB 10.6* 9.6* 9.1*  HCT 30.3* 28.8* 27.0*  MCV 77.5* 81.6 80.1  PLT 9* 7* 8*   Cardiac Enzymes: No results for input(s): "CKTOTAL", "CKMB", "CKMBINDEX", "TROPONINI" in the last 168 hours. BNP: Invalid input(s): "POCBNP" CBG: Recent Labs  Lab 05/22/22 2222 05/23/22 0046 05/23/22 0820 05/23/22 1204 05/23/22 1640  GLUCAP 210* 212* 170* 110* 131*   D-Dimer No results for input(s): "DDIMER" in the last 72 hours. Hgb A1c No results for input(s): "HGBA1C" in the last 72 hours. Lipid Profile No results for input(s): "CHOL", "HDL", "LDLCALC", "TRIG", "CHOLHDL", "LDLDIRECT" in the last 72 hours. Thyroid function studies No results for input(s): "TSH", "T4TOTAL", "T3FREE", "THYROIDAB" in the last 72 hours.  Invalid input(s): "FREET3" Anemia work up Recent Labs    05/23/22 1200  VITAMINB12 706  FOLATE 39.7  FERRITIN 45  TIBC 304  IRON 29*  RETICCTPCT 4.4*   Microbiology Recent Results (from the past 240 hour(s))  MRSA Next Gen by PCR, Nasal     Status: None    Collection Time: 05/23/22 12:26 AM   Specimen: Nasal Mucosa; Nasal Swab  Result Value Ref Range Status   MRSA by PCR Next Gen NOT DETECTED NOT DETECTED Final    Comment: (NOTE) The GeneXpert MRSA Assay (FDA approved for NASAL specimens only), is one component of a comprehensive MRSA colonization surveillance program. It is not intended to diagnose MRSA infection nor to guide or monitor treatment for MRSA infections. Test performance is not FDA approved in patients less than 2 years old. Performed at [facility], [place] [address], [place], [place] [address]       Studies:  CT Abdomen Pelvis W Contrast  Result Date: 05/22/2022 CLINICAL DATA:  Abdominal pain, right flank hematoma. Multiple bruises on body from following. EXAM: CT ABDOMEN AND PELVIS WITH CONTRAST TECHNIQUE: Multidetector CT imaging of the abdomen and pelvis was performed using the standard protocol following bolus administration of intravenous contrast. RADIATION DOSE REDUCTION: This exam was performed according to the departmental dose-optimization program which includes automated exposure control, adjustment of the mA and/or kV according to patient size and/or use of iterative reconstruction technique. CONTRAST:  16m OMNIPAQUE IOHEXOL 300 MG/ML  SOLN COMPARISON:  08/06/2012, 12/25/2021. FINDINGS: Lower chest: The heart is normal in size and coronary artery calcifications are noted. The lung bases are clear. Hepatobiliary: The liver has a nodular contour, compatible with underlying cirrhosis. Fatty infiltration of the liver is noted. There is a circumscribed heterogenous mass in the anterior right lobe of the liver measuring 4.4 cm. There is a 1.6 cm hypervascular focus in the left lobe of the liver, hepatic segment 4B. No biliary ductal dilatation. The gallbladder is without stones. Pancreas: Unremarkable. No pancreatic ductal dilatation or surrounding inflammatory changes. Spleen: Spleen is enlarged measuring  17.3 cm.  No focal abnormality. Adrenals/Urinary Tract: No adrenal nodule or mass. The kidney enhance symmetrically. No renal calculus or hydronephrosis. The bladder is unremarkable. Stomach/Bowel: Moderate hiatal hernia. Esophageal varices are noted. No obstruction, free air or pneumatosis. Appendix appears normal. No focal bowel wall thickening or surrounding inflammatory changes. Vascular/Lymphatic: Aortic atherosclerosis. The portal vein, splenic vein, and superior mesenteric vein are patent. Esophageal varices are present. A few scattered varices are noted in the mid right abdomen. No abdominal or pelvic lymphadenopathy by size criteria. Reproductive: Prostate is unremarkable. Other: A trace amount of ascites is noted in the pelvis on the right. Musculoskeletal: No acute or suspicious osseous abnormality. Subcutaneous fat stranding is present over the right flank, compatible with known contusion. IMPRESSION: 1. Subcutaneous fat stranding over the right flank, compatible with history of contusion. No acute fracture is seen. 2. Morphologic changes of cirrhosis and portal hypertension with hepatic steatosis. 3. Heterogeneous mass in the anterior right lobe  of the liver measuring 4.4 cm and hypervascular lesion in the left lobe of the liver measuring 1.6 cm, previously characterized by MRI as concerning for hepatocellular carcinoma. Correlation with prior biopsy results is recommended. 4. Moderate hiatal hernia. 5. Aortic atherosclerosis and coronary artery calcifications. Electronically Signed   By: Brett Fairy M.D.   On: 05/22/2022 21:28   CT Head Wo Contrast  Result Date: 05/22/2022 CLINICAL DATA:  Mental status change, unknown cause EXAM: CT HEAD WITHOUT CONTRAST TECHNIQUE: Contiguous axial images were obtained from the base of the skull through the vertex without intravenous contrast. RADIATION DOSE REDUCTION: This exam was performed according to the departmental dose-optimization program which includes  automated exposure control, adjustment of the mA and/or kV according to patient size and/or use of iterative reconstruction technique. COMPARISON:  Head CT 03/10/2022 FINDINGS: Brain: No intracranial hemorrhage, mass effect, or midline shift. No hydrocephalus. The basilar cisterns are patent. Stable chronic small vessel ischemic change. Stable atrophy. No evidence of territorial infarct or acute ischemia. No extra-axial or intracranial fluid collection. Vascular: Atherosclerosis of skullbase vasculature without hyperdense vessel or abnormal calcification. Skull: No fracture or focal lesion. Sinuses/Orbits: No acute findings. Chronic opacification of a few left mastoid air cells. Other: None. IMPRESSION: 1. No acute intracranial abnormality. 2. Stable atrophy and chronic small vessel ischemic change. Electronically Signed   By: Keith Rake M.D.   On: 05/22/2022 21:08   DG Chest Port 1 View  Result Date: 05/22/2022 CLINICAL DATA:  Weakness EXAM: PORTABLE CHEST 1 VIEW COMPARISON:  12/24/2021 FINDINGS: The heart size and mediastinal contours are within normal limits. Both lungs are clear. The visualized skeletal structures are unremarkable. IMPRESSION: No active disease. Electronically Signed   By: Fidela Salisbury M.D.   On: 05/22/2022 19:08    Assessment: 52 y.o. male   Pancytopenia, with worsening thrombocytopenia, secondary to liver cirrhosis and alcohol toxicity Liver cirrhosis  Liver mass  Alcohol abuse  DM, HTN  Plan:  -lab and CT reviewed.  -His severe thrombocytopenia is likely related to alcohol toxicity induced bone marrow suppression, and chronic cirrhosis.  I agree with platelet transfusion. In lack of active bleeding and infection, I think a 1 unit a day is enough if plt<15K  -mild anemia is stable, no evidence of nutritional anemia -His liver mass is overall stable on CT scan, comparing to last CT in October 2023.  Lesion has been previously biopsied which was negative for malignancy.   Will continue follow-up.  I will obtain a liver MRI in 2 to 3 months in outpatient setting. -we will f/u as needed.    Truitt Merle, MD 05/23/2022  5:08 PM

## 2022-05-23 NOTE — Hospital Course (Signed)
Mr. Subler is a 52 y.o. 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  In the ER, found to have worse pancytopenia.    Subsequently found to have MSSA bacteremia.

## 2022-05-23 NOTE — Assessment & Plan Note (Signed)
Patient with elevated blood pressure likely component of alcohol abuse/withdrawal. -Start Norvasc 10 mg daily. -IV hydralazine as needed.

## 2022-05-23 NOTE — Assessment & Plan Note (Signed)
-  Resume home regimen of Flomax. 

## 2022-05-23 NOTE — Assessment & Plan Note (Signed)
Unchanged

## 2022-05-23 NOTE — Assessment & Plan Note (Addendum)
Pt with EtOH cirrhosis of liver, unfortunately continues to drink heavily. Mild transaminitis that looks to be baseline. Doesn't seem to be on any diuretics chronically, no lactulose, etc. -Patient started on Lasix 20 mg daily and spironolactone 25 mg daily. -Will need outpatient follow-up with GI postdischarge.

## 2022-05-23 NOTE — Assessment & Plan Note (Addendum)
Pt with EtOH intoxication on presentation noted to have alcohol level of 415.   -Patient with concern for alcohol withdrawal. -Patient states drinks about 20 beers daily and has been drinking since age 52.   -Patient states he is ready for alcohol cessation and will not drink again posthospitalization.   -Patient noted to be tachycardic, some clamminess/diaphoretic on exam on 05/25/2022, per RN when patient stands up with significant tremors noted. -Continue Librium detox protocol.   -Continue stepdown Ativan CIWA protocol, thiamine, folic acid, multivitamin.   -Patient noted with a CIWA score of 20 this morning.   -Consult with PCCM for further evaluation to see whether patient needs to be placed on the Precedex drip.

## 2022-05-23 NOTE — Assessment & Plan Note (Signed)
Pt has h/o traumatic injury to R eye in March 2023, with chronic dilated pupil and very poor vision in that eye ever since.  Not on any eye-drops chronically.  He confirms that none of this is new or changed recently though and review of 06/01/21 ED visit note and 11/26/21 optho appointment note seem to confirm that this is a chronic issue.

## 2022-05-24 DIAGNOSIS — F10920 Alcohol use, unspecified with intoxication, uncomplicated: Secondary | ICD-10-CM | POA: Diagnosis not present

## 2022-05-24 DIAGNOSIS — E876 Hypokalemia: Secondary | ICD-10-CM

## 2022-05-24 DIAGNOSIS — D61818 Other pancytopenia: Secondary | ICD-10-CM | POA: Diagnosis not present

## 2022-05-24 DIAGNOSIS — S301XXA Contusion of abdominal wall, initial encounter: Secondary | ICD-10-CM | POA: Diagnosis not present

## 2022-05-24 LAB — CBC WITH DIFFERENTIAL/PLATELET
Abs Immature Granulocytes: 0.01 10*3/uL (ref 0.00–0.07)
Abs Immature Granulocytes: 0.02 10*3/uL (ref 0.00–0.07)
Basophils Absolute: 0 10*3/uL (ref 0.0–0.1)
Basophils Absolute: 0 10*3/uL (ref 0.0–0.1)
Basophils Relative: 1 %
Basophils Relative: 1 %
Eosinophils Absolute: 0 10*3/uL (ref 0.0–0.5)
Eosinophils Absolute: 0 10*3/uL (ref 0.0–0.5)
Eosinophils Relative: 1 %
Eosinophils Relative: 1 %
HCT: 27.3 % — ABNORMAL LOW (ref 39.0–52.0)
HCT: 26.9 % — ABNORMAL LOW (ref 39.0–52.0)
Hemoglobin: 9 g/dL — ABNORMAL LOW (ref 13.0–17.0)
Hemoglobin: 8.8 g/dL — ABNORMAL LOW (ref 13.0–17.0)
Immature Granulocytes: 1 %
Immature Granulocytes: 1 %
Lymphocytes Relative: 13 %
Lymphocytes Relative: 10 %
Lymphs Abs: 0.2 10*3/uL — ABNORMAL LOW (ref 0.7–4.0)
Lymphs Abs: 0.2 10*3/uL — ABNORMAL LOW (ref 0.7–4.0)
MCH: 27.2 pg (ref 26.0–34.0)
MCH: 27 pg (ref 26.0–34.0)
MCHC: 33 g/dL (ref 30.0–36.0)
MCHC: 32.7 g/dL (ref 30.0–36.0)
MCV: 82.5 fL (ref 80.0–100.0)
MCV: 82.5 fL (ref 80.0–100.0)
Monocytes Absolute: 0.3 10*3/uL (ref 0.1–1.0)
Monocytes Absolute: 0.4 10*3/uL (ref 0.1–1.0)
Monocytes Relative: 17 %
Monocytes Relative: 17 %
Neutro Abs: 1.2 10*3/uL — ABNORMAL LOW (ref 1.7–7.7)
Neutro Abs: 1.5 10*3/uL — ABNORMAL LOW (ref 1.7–7.7)
Neutrophils Relative %: 67 %
Neutrophils Relative %: 70 %
Platelets: 13 10*3/uL — CL (ref 150–400)
Platelets: 16 10*3/uL — CL (ref 150–400)
RBC: 3.31 MIL/uL — ABNORMAL LOW (ref 4.22–5.81)
RBC: 3.26 MIL/uL — ABNORMAL LOW (ref 4.22–5.81)
RDW: 19.7 % — ABNORMAL HIGH (ref 11.5–15.5)
RDW: 19.2 % — ABNORMAL HIGH (ref 11.5–15.5)
WBC: 1.8 10*3/uL — ABNORMAL LOW (ref 4.0–10.5)
WBC: 2.1 10*3/uL — ABNORMAL LOW (ref 4.0–10.5)
nRBC: 0 % (ref 0.0–0.2)
nRBC: 0 % (ref 0.0–0.2)

## 2022-05-24 LAB — BPAM PLATELET PHERESIS
Blood Product Expiration Date: 202403022359
Blood Product Expiration Date: 202403042359
ISSUE DATE / TIME: 202403010626
ISSUE DATE / TIME: 202403011533
Unit Type and Rh: 6200
Unit Type and Rh: 6200

## 2022-05-24 LAB — PREPARE PLATELET PHERESIS
Unit division: 0
Unit division: 0

## 2022-05-24 LAB — COMPREHENSIVE METABOLIC PANEL
ALT: 83 U/L — ABNORMAL HIGH (ref 0–44)
AST: 93 U/L — ABNORMAL HIGH (ref 15–41)
Albumin: 3.4 g/dL — ABNORMAL LOW (ref 3.5–5.0)
Alkaline Phosphatase: 70 U/L (ref 38–126)
Anion gap: 15 (ref 5–15)
BUN: 7 mg/dL (ref 6–20)
CO2: 25 mmol/L (ref 22–32)
Calcium: 8.2 mg/dL — ABNORMAL LOW (ref 8.9–10.3)
Chloride: 91 mmol/L — ABNORMAL LOW (ref 98–111)
Creatinine, Ser: 0.52 mg/dL — ABNORMAL LOW (ref 0.61–1.24)
GFR, Estimated: >60 mL/min (ref >60)
Glucose, Bld: 150 mg/dL — ABNORMAL HIGH (ref 70–99)
Potassium: 3.3 mmol/L — ABNORMAL LOW (ref 3.5–5.1)
Sodium: 131 mmol/L — ABNORMAL LOW (ref 135–145)
Total Bilirubin: 3.3 mg/dL — ABNORMAL HIGH (ref 0.3–1.2)
Total Protein: 6 g/dL — ABNORMAL LOW (ref 6.5–8.1)

## 2022-05-24 LAB — GLUCOSE, CAPILLARY
Glucose-Capillary: 222 mg/dL — ABNORMAL HIGH (ref 70–99)
Glucose-Capillary: 182 mg/dL — ABNORMAL HIGH (ref 70–99)
Glucose-Capillary: 166 mg/dL — ABNORMAL HIGH (ref 70–99)
Glucose-Capillary: 153 mg/dL — ABNORMAL HIGH (ref 70–99)

## 2022-05-24 LAB — PHOSPHORUS: Phosphorus: 2.6 mg/dL (ref 2.5–4.6)

## 2022-05-24 LAB — MAGNESIUM: Magnesium: 1.8 mg/dL (ref 1.7–2.4)

## 2022-05-24 MED ORDER — POTASSIUM CHLORIDE CRYS ER 20 MEQ PO TBCR: 40.0000 meq | EXTENDED_RELEASE_TABLET | Freq: Once | ORAL | Status: DC

## 2022-05-24 MED ORDER — SODIUM CHLORIDE 0.9% IV SOLUTION: Freq: Once | INTRAVENOUS | Status: AC

## 2022-05-24 MED ORDER — OXYCODONE HCL 5 MG PO TABS
5.0000 mg | ORAL_TABLET | Freq: Four times a day (QID) | ORAL | Status: DC | PRN
  Administered 2022-05-24 – 2022-05-26 (×3): 10 mg via ORAL
  Administered 2022-05-26: 5 mg via ORAL
  Administered 2022-05-27: 10 mg via ORAL
  Administered 2022-05-27: 5 mg via ORAL
  Administered 2022-05-28 – 2022-05-31 (×9): 10 mg via ORAL
  Filled 2022-05-24 (×4): qty 2
  Filled 2022-05-24: qty 1
  Filled 2022-05-24 (×11): qty 2

## 2022-05-24 MED ORDER — POTASSIUM CHLORIDE CRYS ER 10 MEQ PO TBCR
40.0000 meq | EXTENDED_RELEASE_TABLET | Freq: Once | ORAL | Status: AC
  Administered 2022-05-24: 40 meq via ORAL
  Filled 2022-05-24: qty 4

## 2022-05-24 MED ORDER — FUROSEMIDE 10 MG/ML IJ SOLN
20.0000 mg | Freq: Once | INTRAMUSCULAR | Status: AC
  Administered 2022-05-24: 20 mg via INTRAVENOUS
  Filled 2022-05-24: qty 2

## 2022-05-24 MED ORDER — LACTULOSE 10 GM/15ML PO SOLN
30.0000 g | Freq: Every day | ORAL | Status: DC
  Administered 2022-05-24 – 2022-05-31 (×8): 30 g via ORAL
  Filled 2022-05-24 (×9): qty 45

## 2022-05-24 MED ORDER — OXYCODONE HCL 5 MG PO TABS
5.0000 mg | ORAL_TABLET | Freq: Once | ORAL | Status: AC
  Administered 2022-05-24: 5 mg via ORAL
  Filled 2022-05-24: qty 1

## 2022-05-24 NOTE — Assessment & Plan Note (Addendum)
Replete

## 2022-05-24 NOTE — Progress Notes (Signed)
OT Cancellation Note  Patient Details Name: [name] MRN: [id number] DOB: 08/29/1970   Cancelled Treatment:    Reason Eval/Treat Not Completed: Medical issues which prohibited therapy Per nurse, patient in bed with withdrawal symptoms at this time with recent ativan administration. Nurse asking to hold therapy at this time. OT to continue to follow and check back as schedule will allow.  Rennie Plowman, MS Acute Rehabilitation Department Office# [phone]  05/24/2022, 1:59 PM

## 2022-05-24 NOTE — Progress Notes (Signed)
PT Cancellation Note  Patient Details Name: [name] MRN: [id number] DOB: [birth year]   Cancelled Treatment:     PT order received but eval deferred - per RN, patient in bed with withdrawal symptoms at this time with recent ativan administration. Nurse asking to hold therapy at this time. Will follow   [name] 05/24/2022, 2:53 PM

## 2022-05-24 NOTE — Progress Notes (Signed)
PROGRESS NOTE    [name]  [id number] DOB: [birth year] DOA: 05/22/2022 PCP: [name], MD    Chief Complaint  Patient presents with   Alcohol Problem    Brief Narrative:  HPI per Dr. Mylo Red Kender Lewis is a 52 y.o. male with medical history significant of ongoing EtOH abuse, cirrhosis, HTN, DM2, portal hypertension with pancytopenia.   Pt in to ED at med center with c/o recurrent falls in setting of ongoing drinking.  He cannot tell me how much she is drinking. He does want to stop drinking and he said he has been here before and we prescribed him Librium which helped. He has various bruises over his body including a large bruise on his right flank. He thinks this is probably from falling.    Assessment & Plan:  Principal Problem:   Other pancytopenia ([place]) Active Problems:   Alcohol abuse   Cirrhosis of liver (HCC)   Liver mass   Hypomagnesemia   DM (diabetes mellitus), type 2 (HCC)   History of eye trauma   Hypertension   Hypokalemia   Benign prostatic hyperplasia (BPH) with straining on urination   Acute alcoholic intoxication without complication (HCC)   Hematoma of right flank    Assessment and Plan: * Other pancytopenia (HCC) Pt with chronic pancytopenia and severe thrombocytopenia that is believed to be "secondary to splenomegaly and alcohol abuse " as per oncologist Dr. Burr Medico note in Oct 2023. -Platelet count noted at 13 this morning. -Patient status post 2 units of platelets 05/23/2022. -Patient with multiple areas of ecchymosis/bruising however denies any overt bleeding. -Anemia panel consistent with anemia of chronic disease.  Iron level of 29, ferritin of 45, folate of 39.7, vitamin B12 of 706. -Continue PPI IV twice daily. -Transfuse a unit of platelets. -Patient seen by hematology/oncology who feels patient's pancytopenia with worsening thrombocytopenia secondary to liver cirrhosis and alcohol toxicity induced bone marrow  suppression. -Hematology/oncology recommending transfusion of a unit of platelets daily if platelet count < 15K. -Appreciate hematology input and recommendations.  Liver mass 4.4cm (previously 4.6cm) heterogenous lesion of anterior R lobe of liver.  Biopsy in Oct 2023 was neg for [facility] (just showed primarily necrotic tissue). Doesn't look to have enlarged on todays CT scan (actually was 4.6cm previously, 4.4cm today). Also has 1.6cm hypervascular lesion of L lobe of liver.  Looks like this was 1.0cm on prior CT, but they didn't mention it at all on MRI previously. 4.4cm lesion with neg pathology, doesn't seem to be enlarging on serial imaging exams. 1.6cm hypervascular lesion of L lobe of liver of unclear significance Hematology/oncology consulted and reviewed imaging and recommended to follow for now with outpatient liver MRI in 2 to 3 months.    Cirrhosis of liver (HCC) Pt with EtOH cirrhosis of liver, unfortunately continues to drink heavily. Mild transaminitis that looks to be baseline. Doesn't seem to be on any diuretics chronically, no lactulose, etc. -Patient started on Lasix 20 mg daily and spironolactone 25 mg daily. -Will need outpatient follow-up with GI postdischarge.  Alcohol abuse Pt with EtOH intoxication on presentation noted to have alcohol level of 415.   -Patient states drinks about 20 beers daily and has been drinking since age 33.   -Patient states he is ready for alcohol cessation and will not drink again posthospitalization.   -Continue Librium detox protocol.   -Continue Ativan CIWA protocol.   -Continue thiamine, folic acid, multivitamin.   History of eye trauma Pt has h/o traumatic  injury to R eye in March 2023, with chronic dilated pupil and very poor vision in that eye ever since.  Not on any eye-drops chronically.  He confirms that none of this is new or changed recently though and review of 06/01/21 ED visit note and 11/26/21 optho appointment note seem to confirm  that this is a chronic issue.  DM (diabetes mellitus), type 2 (HCC) - Hemoglobin A1c 7.9  (04/03/2022 ) -CBG 222 this morning. -Continue sliding scale insulin.    Hypomagnesemia - Mag level noted at 1.6 on presentation, noted to have received magnesium in the ED.   -Repeat magnesium level at 1.8. -Follow.  Hematoma of right flank -Patient with large hematoma of the right flank area complaining of pain. -Secondary to fall in the setting of pancytopenia with severe thrombocytopenia. -Warm compresses. -Pain management.  Benign prostatic hyperplasia (BPH) with straining on urination -Continue home regimen of Flomax.  Hypokalemia -Replete.  Hypertension Patient with elevated blood pressure likely component of alcohol abuse/withdrawal. -Continue Norvasc 10 mg daily. -Patient started on Lasix and spironolactone for cirrhosis which we will continue for now.   -Patient resumed back on home regimen Flomax.   -IV hydralazine as needed.         DVT prophylaxis: SCDs Code Status: Full Family Communication: Updated patient.  No family at bedside. Disposition: Likely home when clinically improved.  Status is: Inpatient Remains inpatient appropriate because: Severity of illness   Consultants:  Hematology/oncology: Dr. Burr Medico 05/23/2022  Procedures:  CT abdomen and pelvis 05/22/2022 Transfusion 2 units platelets 05/23/2022 Transfusion 1 unit platelet pending 05/24/2022  Antimicrobials:  None   Subjective: Laying in bed.  Denies any bleeding.  States no significant change over the past 24 hours.  Still with complaints of right flank pain.    Objective: Vitals:   05/24/22 0755 05/24/22 0800 05/24/22 0823 05/24/22 0824  BP:  (!) 163/70    Pulse:  90 99 98  Resp:  14  19  Temp: 98.8 F (37.1 C)     TempSrc: Oral     SpO2:    95%  Weight:      Height:        Intake/Output Summary (Last 24 hours) at 05/24/2022 0937 Last data filed at 05/24/2022 0600 Gross per 24 hour  Intake  608 ml  Output 450 ml  Net 158 ml   Filed Weights   05/22/22 1833 05/23/22 0042  Weight: 72.6 kg 70.1 kg    Examination:  General exam: NAD.  Minimal tremors. Respiratory system: Lungs clear to auscultation bilaterally anterior lung fields.  No wheezes, no crackles, no rhonchi.  Fair air movement.  Speaking in full sentences.   Cardiovascular system: Tachycardia.  No JVD, no murmurs rubs or gallops.  No lower extremity edema.  Gastrointestinal system: Abdomen is soft, minimal to mildly distended, positive bowel sounds.  No rebound.  No guarding.  Central nervous system: Alert and oriented. No focal neurological deficits. Extremities: Symmetric 5 x 5 power. Skin: Multiple areas of ecchymosis over his skin including large area of ecchymosis in the right flank/lower back region.  Left upper extremity ecchymosis.  Left lower extremity ecchymosis.  Psychiatry: Judgement and insight appear normal. Mood & affect appropriate.     Data Reviewed:   CBC: Recent Labs  Lab 05/22/22 1901 05/23/22 0348 05/23/22 1200 05/23/22 1955 05/24/22 0249  WBC 6.0 4.0 2.9* 2.3* 1.8*  NEUTROABS 4.8  --  2.0 1.5* 1.2*  HGB 10.6* 9.6* 9.1* 9.1* 9.0*  HCT 30.3* 28.8* 27.0* 27.0* 27.3*  MCV 77.5* 81.6 80.1 81.6 82.5  PLT 9* 7* 8* 16* 13*    Basic Metabolic Panel: Recent Labs  Lab 05/22/22 1901 05/23/22 0348 05/24/22 0249  NA 124* 131* 131*  K 3.6 3.6 3.3*  CL 89* 95* 91*  CO2 19* 24 25  GLUCOSE 210* 213* 150*  BUN <5* 5* 7  CREATININE 0.49* 0.56* 0.52*  CALCIUM 7.9* 7.7* 8.2*  MG 1.6* 2.1 1.8  PHOS  --   --  2.6    GFR: Estimated Creatinine Clearance: 99.8 mL/min (A) (by C-G formula based on SCr of 0.52 mg/dL (L)).  Liver Function Tests: Recent Labs  Lab 05/22/22 1901 05/23/22 0348 05/24/22 0249  AST 153* 127* 93*  ALT 114* 100* 83*  ALKPHOS 80 66 70  BILITOT 3.1* 3.2* 3.3*  PROT 6.6 6.2* 6.0*  ALBUMIN 3.8 3.4* 3.4*    CBG: Recent Labs  Lab 05/23/22 0820 05/23/22 1204  05/23/22 1640 05/23/22 2134 05/24/22 0813  GLUCAP 170* 110* 131* 146* 222*     Recent Results (from the past 240 hour(s))  MRSA Next Gen by PCR, Nasal     Status: None   Collection Time: 05/23/22 12:26 AM   Specimen: Nasal Mucosa; Nasal Swab  Result Value Ref Range Status   MRSA by PCR Next Gen NOT DETECTED NOT DETECTED Final    Comment: (NOTE) The GeneXpert MRSA Assay (FDA approved for NASAL specimens only), is one component of a comprehensive MRSA colonization surveillance program. It is not intended to diagnose MRSA infection nor to guide or monitor treatment for MRSA infections. Test performance is not FDA approved in patients less than 58 years old. Performed at [facility], [place] [address] [place], [place] [address]          Radiology Studies: CT Abdomen Pelvis W Contrast  Result Date: 05/22/2022 CLINICAL DATA:  Abdominal pain, right flank hematoma. Multiple bruises on body from following. EXAM: CT ABDOMEN AND PELVIS WITH CONTRAST TECHNIQUE: Multidetector CT imaging of the abdomen and pelvis was performed using the standard protocol following bolus administration of intravenous contrast. RADIATION DOSE REDUCTION: This exam was performed according to the departmental dose-optimization program which includes automated exposure control, adjustment of the mA and/or kV according to patient size and/or use of iterative reconstruction technique. CONTRAST:  11m OMNIPAQUE IOHEXOL 300 MG/ML  SOLN COMPARISON:  08/06/2012, 12/25/2021. FINDINGS: Lower chest: The heart is normal in size and coronary artery calcifications are noted. The lung bases are clear. Hepatobiliary: The liver has a nodular contour, compatible with underlying cirrhosis. Fatty infiltration of the liver is noted. There is a circumscribed heterogenous mass in the anterior right lobe of the liver measuring 4.4 cm. There is a 1.6 cm hypervascular focus in the left lobe of the liver, hepatic segment 4B.  No biliary ductal dilatation. The gallbladder is without stones. Pancreas: Unremarkable. No pancreatic ductal dilatation or surrounding inflammatory changes. Spleen: Spleen is enlarged measuring 17.3 cm.  No focal abnormality. Adrenals/Urinary Tract: No adrenal nodule or mass. The kidney enhance symmetrically. No renal calculus or hydronephrosis. The bladder is unremarkable. Stomach/Bowel: Moderate hiatal hernia. Esophageal varices are noted. No obstruction, free air or pneumatosis. Appendix appears normal. No focal bowel wall thickening or surrounding inflammatory changes. Vascular/Lymphatic: Aortic atherosclerosis. The portal vein, splenic vein, and superior mesenteric vein are patent. Esophageal varices are present. A few scattered varices are noted in the mid right abdomen. No abdominal or pelvic lymphadenopathy by size criteria. Reproductive:  Prostate is unremarkable. Other: A trace amount of ascites is noted in the pelvis on the right. Musculoskeletal: No acute or suspicious osseous abnormality. Subcutaneous fat stranding is present over the right flank, compatible with known contusion. IMPRESSION: 1. Subcutaneous fat stranding over the right flank, compatible with history of contusion. No acute fracture is seen. 2. Morphologic changes of cirrhosis and portal hypertension with hepatic steatosis. 3. Heterogeneous mass in the anterior right lobe of the liver measuring 4.4 cm and hypervascular lesion in the left lobe of the liver measuring 1.6 cm, previously characterized by MRI as concerning for hepatocellular carcinoma. Correlation with prior biopsy results is recommended. 4. Moderate hiatal hernia. 5. Aortic atherosclerosis and coronary artery calcifications. Electronically Signed   By: Brett Fairy M.D.   On: 05/22/2022 21:28   CT Head Wo Contrast  Result Date: 05/22/2022 CLINICAL DATA:  Mental status change, unknown cause EXAM: CT HEAD WITHOUT CONTRAST TECHNIQUE: Contiguous axial images were obtained  from the base of the skull through the vertex without intravenous contrast. RADIATION DOSE REDUCTION: This exam was performed according to the departmental dose-optimization program which includes automated exposure control, adjustment of the mA and/or kV according to patient size and/or use of iterative reconstruction technique. COMPARISON:  Head CT 03/10/2022 FINDINGS: Brain: No intracranial hemorrhage, mass effect, or midline shift. No hydrocephalus. The basilar cisterns are patent. Stable chronic small vessel ischemic change. Stable atrophy. No evidence of territorial infarct or acute ischemia. No extra-axial or intracranial fluid collection. Vascular: Atherosclerosis of skullbase vasculature without hyperdense vessel or abnormal calcification. Skull: No fracture or focal lesion. Sinuses/Orbits: No acute findings. Chronic opacification of a few left mastoid air cells. Other: None. IMPRESSION: 1. No acute intracranial abnormality. 2. Stable atrophy and chronic small vessel ischemic change. Electronically Signed   By: Keith Rake M.D.   On: 05/22/2022 21:08   DG Chest Port 1 View  Result Date: 05/22/2022 CLINICAL DATA:  Weakness EXAM: PORTABLE CHEST 1 VIEW COMPARISON:  12/24/2021 FINDINGS: The heart size and mediastinal contours are within normal limits. Both lungs are clear. The visualized skeletal structures are unremarkable. IMPRESSION: No active disease. Electronically Signed   By: Fidela Salisbury M.D.   On: 05/22/2022 19:08        Scheduled Meds:  sodium chloride   Intravenous Once   amLODipine  10 mg Oral Daily   chlordiazePOXIDE  25 mg Oral TID   Followed by   Derrill Memo ON 05/25/2022] chlordiazePOXIDE  25 mg Oral BH-qamhs   Followed by   Derrill Memo ON 05/26/2022] chlordiazePOXIDE  25 mg Oral Daily   Chlorhexidine Gluconate Cloth  6 each Topical Daily   diclofenac Sodium  2 g Topical QID   folic acid  1 mg Oral Daily   furosemide  20 mg Oral Daily   insulin aspart  0-15 Units Subcutaneous TID  WC   insulin aspart  0-5 Units Subcutaneous QHS   mirtazapine  15 mg Oral QHS   multivitamin with minerals  1 tablet Oral Daily   pantoprazole (PROTONIX) IV  40 mg Intravenous Q12H   spironolactone  25 mg Oral Daily   tamsulosin  0.4 mg Oral QPC supper   thiamine  100 mg Oral Daily   Or   thiamine  100 mg Intravenous Daily   Continuous Infusions:   LOS: 1 day    Time spent: 40 minutes    Irine Seal, MD [facility]   To contact the attending provider between 7A-7P or the covering provider during after hours  7P-7A, please log into the web site [email or web address] and access using universal [place] password for that web site. If you do not have the password, please call the hospital operator.  05/24/2022, 9:37 AM

## 2022-05-24 NOTE — Progress Notes (Signed)
PT continued to report 10/10 pain associated with known contusion to his back. Pt given prn pain meds as ordered without change in pain. PT appears to be suffering from increase in ETOH withdrawals. PT fidgety, becoming increasingly diaphoretic, startles when spoken to at times, and BP/HR are extremely elevated. Full body tremors are also becoming more pronounced, speech is more slurred, and wife reports that he is starting to not make much sense when speaking to her. He has had a little difficulty swallowing pills this evening but is still tolerating them for now. IV ativan given when symptomatic and CIWAs completed frequently. Discomfort seems to decrease after receiving ativan.

## 2022-05-24 NOTE — TOC Initial Note (Signed)
Transition of Care [facility]) - Initial/Assessment Note    Patient Details  Name: [name] MRN: [id number] Date of Birth: [birth year]  [facility]) CM/SW Contact:    Henrietta Dine, RN Phone Number: 05/24/2022, 4:17 PM  Clinical Narrative:                 [facility] consult for SA counseling/education; pt and wife in room; pt sleeping; spoke w/ wife Jones Skene [phone]); she says pt is from home and plans for him to return at d/c; she denies pt experiencing IPV, food insecurity, or difficulty paying utilities; she says pt has transportation; his wife says he does not have glasses, dentures or HA; she says pt does not have DME, [place] services, or home oxygen; she agrees to receiving resources for SA; resources for IP and OP counseling placed in d/c instructions; pt's wife also given copies of resources; she verbalized understanding, and they will make pt's appt for agency of choice; no TOC needs.  Expected Discharge Plan: Home/Self Care Barriers to Discharge: Continued Medical Work up   Patient Goals and CMS Choice Patient states their goals for this hospitalization and ongoing recovery are:: home per pt's wife Jones Skene          Expected Discharge Plan and Services   Discharge Planning Services: CM Consult   Living arrangements for the past 2 months: Single Family Home                                      Prior Living Arrangements/Services Living arrangements for the past 2 months: Single Family Home Lives with:: Spouse Patient language and need for interpreter reviewed:: Yes Do you feel safe going back to the place where you live?: Yes      Need for Family Participation in Patient Care: Yes (Comment) Care giver support system in place?: Yes (comment) Current home services:  (n/a) Criminal Activity/Legal Involvement Pertinent to Current Situation/Hospitalization: No - Comment as needed  Activities of Daily Living Home Assistive  Devices/Equipment: None ADL Screening (condition at time of admission) Patient's cognitive ability adequate to safely complete daily activities?: Yes Is the patient deaf or have difficulty hearing?: No Does the patient have difficulty seeing, even when wearing glasses/contacts?: No Does the patient have difficulty concentrating, remembering, or making decisions?: Yes Patient able to express need for assistance with ADLs?: Yes Does the patient have difficulty dressing or bathing?: No Independently performs ADLs?: Yes (appropriate for developmental age) Does the patient have difficulty walking or climbing stairs?: Yes Weakness of Legs: None Weakness of Arms/Hands: None  Permission Sought/Granted Permission sought to share information with : Case Manager Permission granted to share information with : Yes, Verbal Permission Granted  Share Information with NAME: Lenor Coffin, RN, CM     Permission granted to share info w Relationship: Jones Skene (wife) [phone]     Emotional Assessment Appearance:: Appears stated age Attitude/Demeanor/Rapport: Unable to Assess Affect (typically observed): Unable to Assess Orientation: :  (unable to assess) Alcohol / Substance Use: Alcohol Use Psych Involvement: No (comment)  Admission diagnosis:  Hypomagnesemia [E83.42] Hyponatremia [E87.1] Thrombocytopenia (HCC) [D69.6] Recurrent falls [R29.6] Hematoma of right flank, initial encounter [id number] Acute alcoholic intoxication without complication ([place]) 123456 Acute on chronic alcoholic liver disease ([place]) [K70.9] Patient Active Problem List   Diagnosis Date Noted   History of eye trauma [id number]   Acute alcoholic intoxication without complication (  [place]) 05/23/2022   Hematoma of right flank 05/23/2022   Recurrent falls 05/22/2022   Psychophysiological insomnia 04/03/2022   Benign prostatic hyperplasia (BPH) with straining on urination 04/03/2022   Other pancytopenia ([place]) 02/03/2022    Liver mass    Low TSH level 12/25/2021   GIB (gastrointestinal bleeding) 12/24/2021   DM (diabetes mellitus), type 2 ([place]) 12/24/2021   Alcohol withdrawal ([place]) 12/12/2020   Substance induced mood disorder ([place]) 12/09/2020   Hypokalemia 08/15/2012   Hypomagnesemia 08/15/2012   Other and unspecified hyperlipidemia 08/15/2012   Encephalopathy acute 08/09/2012   Alcohol withdrawal with delirium ([place]) 08/08/2012   Portal hypertension ([place]) 08/08/2012   Splenomegaly [id number]   Alcoholic hepatitis without ascites 08/08/2012   Hiatal hernia 08/08/2012   Alcohol abuse 08/06/2012   Elevated blood alcohol level 08/06/2012   Acute pancreatitis 08/06/2012   Cirrhosis of liver ([place]) 08/06/2012   Thrombocytopenia ([place]) 08/06/2012   Hypertension    Anxiety    DVT (deep venous thrombosis) ([place])    PCP:  [name], MD Pharmacy:   [facility] [place] [place] [address] Phone: [phone] Fax: [phone]  [id number] [id number]: [phone]: [phone]    Social Determinants of Health (SDOH) Social History: SDOH Screenings   Food Insecurity: No Food Insecurity (05/24/2022)  Housing: Low Risk  (05/24/2022)  Transportation Needs: No Transportation Needs (05/24/2022)  Utilities: Not At Risk (05/24/2022)  Alcohol Screen: High Risk (12/09/2020)  Tobacco Use: Low Risk  (05/23/2022)   SDOH Interventions: Food Insecurity Interventions: Inpatient TOC Housing Interventions: Inpatient TOC Transportation Interventions: Inpatient TOC Utilities Interventions: Inpatient TOC   Readmission Risk Interventions     No data to display

## 2022-05-25 ENCOUNTER — Inpatient Hospital Stay (HOSPITAL_COMMUNITY): Payer: 59

## 2022-05-25 DIAGNOSIS — D61818 Other pancytopenia: Secondary | ICD-10-CM | POA: Diagnosis not present

## 2022-05-25 DIAGNOSIS — R0902 Hypoxemia: Secondary | ICD-10-CM | POA: Clinically undetermined

## 2022-05-25 DIAGNOSIS — E871 Hypo-osmolality and hyponatremia: Secondary | ICD-10-CM | POA: Diagnosis not present

## 2022-05-25 DIAGNOSIS — D696 Thrombocytopenia, unspecified: Secondary | ICD-10-CM

## 2022-05-25 DIAGNOSIS — S301XXA Contusion of abdominal wall, initial encounter: Secondary | ICD-10-CM | POA: Diagnosis not present

## 2022-05-25 LAB — COMPREHENSIVE METABOLIC PANEL
ALT: 68 U/L — ABNORMAL HIGH (ref 0–44)
AST: 70 U/L — ABNORMAL HIGH (ref 15–41)
Albumin: 3.4 g/dL — ABNORMAL LOW (ref 3.5–5.0)
Alkaline Phosphatase: 67 U/L (ref 38–126)
Anion gap: 10 (ref 5–15)
BUN: 9 mg/dL (ref 6–20)
CO2: 27 mmol/L (ref 22–32)
Calcium: 8.2 mg/dL — ABNORMAL LOW (ref 8.9–10.3)
Chloride: 90 mmol/L — ABNORMAL LOW (ref 98–111)
Creatinine, Ser: 0.73 mg/dL (ref 0.61–1.24)
GFR, Estimated: >60 mL/min (ref >60)
Glucose, Bld: 176 mg/dL — ABNORMAL HIGH (ref 70–99)
Potassium: 3.5 mmol/L (ref 3.5–5.1)
Sodium: 127 mmol/L — ABNORMAL LOW (ref 135–145)
Total Bilirubin: 3.2 mg/dL — ABNORMAL HIGH (ref 0.3–1.2)
Total Protein: 6.3 g/dL — ABNORMAL LOW (ref 6.5–8.1)

## 2022-05-25 LAB — CBC WITH DIFFERENTIAL/PLATELET
Abs Immature Granulocytes: 0.02 10*3/uL (ref 0.00–0.07)
Abs Immature Granulocytes: 0.01 10*3/uL (ref 0.00–0.07)
Basophils Absolute: 0 10*3/uL (ref 0.0–0.1)
Basophils Absolute: 0 10*3/uL (ref 0.0–0.1)
Basophils Relative: 0 %
Basophils Relative: 0 %
Eosinophils Absolute: 0 10*3/uL (ref 0.0–0.5)
Eosinophils Absolute: 0 10*3/uL (ref 0.0–0.5)
Eosinophils Relative: 1 %
Eosinophils Relative: 0 %
HCT: 28.6 % — ABNORMAL LOW (ref 39.0–52.0)
HCT: 24.8 % — ABNORMAL LOW (ref 39.0–52.0)
Hemoglobin: 9.7 g/dL — ABNORMAL LOW (ref 13.0–17.0)
Hemoglobin: 8.2 g/dL — ABNORMAL LOW (ref 13.0–17.0)
Immature Granulocytes: 1 %
Immature Granulocytes: 1 %
Lymphocytes Relative: 10 %
Lymphocytes Relative: 12 %
Lymphs Abs: 0.1 10*3/uL — ABNORMAL LOW (ref 0.7–4.0)
Lymphs Abs: 0.1 10*3/uL — ABNORMAL LOW (ref 0.7–4.0)
MCH: 27.7 pg (ref 26.0–34.0)
MCH: 27.4 pg (ref 26.0–34.0)
MCHC: 33.9 g/dL (ref 30.0–36.0)
MCHC: 33.1 g/dL (ref 30.0–36.0)
MCV: 81.7 fL (ref 80.0–100.0)
MCV: 82.9 fL (ref 80.0–100.0)
Monocytes Absolute: 0.2 10*3/uL (ref 0.1–1.0)
Monocytes Absolute: 0.3 10*3/uL (ref 0.1–1.0)
Monocytes Relative: 14 %
Monocytes Relative: 28 %
Neutro Abs: 1.1 10*3/uL — ABNORMAL LOW (ref 1.7–7.7)
Neutro Abs: 0.7 10*3/uL — ABNORMAL LOW (ref 1.7–7.7)
Neutrophils Relative %: 74 %
Neutrophils Relative %: 59 %
Platelets: 11 10*3/uL — CL (ref 150–400)
Platelets: 15 10*3/uL — CL (ref 150–400)
RBC: 3.5 MIL/uL — ABNORMAL LOW (ref 4.22–5.81)
RBC: 2.99 MIL/uL — ABNORMAL LOW (ref 4.22–5.81)
RDW: 19.5 % — ABNORMAL HIGH (ref 11.5–15.5)
RDW: 19.4 % — ABNORMAL HIGH (ref 11.5–15.5)
WBC: 1.5 10*3/uL — ABNORMAL LOW (ref 4.0–10.5)
WBC: 1.2 10*3/uL — CL (ref 4.0–10.5)
nRBC: 0 % (ref 0.0–0.2)
nRBC: 0 % (ref 0.0–0.2)

## 2022-05-25 LAB — RESP PANEL BY RT-PCR (RSV, FLU A&B, COVID)  RVPGX2
Influenza A by PCR: NEGATIVE
Influenza B by PCR: NEGATIVE
Resp Syncytial Virus by PCR: NEGATIVE
SARS Coronavirus 2 by RT PCR: NEGATIVE

## 2022-05-25 LAB — URINALYSIS, ROUTINE W REFLEX MICROSCOPIC
Bilirubin Urine: NEGATIVE
Glucose, UA: 50 mg/dL — AB
Ketones, ur: 20 mg/dL — AB
Nitrite: POSITIVE — AB
Protein, ur: 30 mg/dL — AB
RBC / HPF: >50 RBC/hpf (ref 0–5)
Specific Gravity, Urine: 1.02 (ref 1.005–1.030)
WBC, UA: >50 WBC/hpf (ref 0–5)
pH: 7 (ref 5.0–8.0)

## 2022-05-25 LAB — GLUCOSE, CAPILLARY
Glucose-Capillary: 165 mg/dL — ABNORMAL HIGH (ref 70–99)
Glucose-Capillary: 178 mg/dL — ABNORMAL HIGH (ref 70–99)
Glucose-Capillary: 124 mg/dL — ABNORMAL HIGH (ref 70–99)
Glucose-Capillary: 127 mg/dL — ABNORMAL HIGH (ref 70–99)

## 2022-05-25 LAB — CREATININE, URINE, RANDOM: Creatinine, Urine: 188 mg/dL

## 2022-05-25 LAB — PHOSPHORUS: Phosphorus: 2 mg/dL — ABNORMAL LOW (ref 2.5–4.6)

## 2022-05-25 LAB — MAGNESIUM: Magnesium: 1.6 mg/dL — ABNORMAL LOW (ref 1.7–2.4)

## 2022-05-25 LAB — SODIUM, URINE, RANDOM: Sodium, Ur: 84 mmol/L

## 2022-05-25 LAB — BRAIN NATRIURETIC PEPTIDE: B Natriuretic Peptide: 110.3 pg/mL — ABNORMAL HIGH (ref 0.0–100.0)

## 2022-05-25 LAB — OSMOLALITY, URINE: Osmolality, Ur: 685 mOsm/kg (ref 300–900)

## 2022-05-25 MED ORDER — THIAMINE MONONITRATE 100 MG PO TABS: 100.0000 mg | ORAL_TABLET | Freq: Every day | ORAL | Status: DC

## 2022-05-25 MED ORDER — ADULT MULTIVITAMIN W/MINERALS CH: 1.0000 | ORAL_TABLET | Freq: Every day | ORAL | Status: DC

## 2022-05-25 MED ORDER — FUROSEMIDE 10 MG/ML IJ SOLN
20.0000 mg | Freq: Once | INTRAMUSCULAR | Status: AC
  Administered 2022-05-25: 20 mg via INTRAVENOUS
  Filled 2022-05-25: qty 2

## 2022-05-25 MED ORDER — POTASSIUM CHLORIDE CRYS ER 20 MEQ PO TBCR
40.0000 meq | EXTENDED_RELEASE_TABLET | Freq: Once | ORAL | Status: AC
  Administered 2022-05-25: 40 meq via ORAL
  Filled 2022-05-25: qty 2

## 2022-05-25 MED ORDER — MAGNESIUM SULFATE 4 GM/100ML IV SOLN
4.0000 g | Freq: Once | INTRAVENOUS | Status: AC
  Administered 2022-05-25: 4 g via INTRAVENOUS
  Filled 2022-05-25: qty 100

## 2022-05-25 MED ORDER — FOLIC ACID 1 MG PO TABS: 1.0000 mg | ORAL_TABLET | Freq: Every day | ORAL | Status: DC

## 2022-05-25 MED ORDER — LORAZEPAM 2 MG/ML IJ SOLN
1.0000 mg | INTRAMUSCULAR | Status: AC | PRN
  Administered 2022-05-25 (×2): 2 mg via INTRAVENOUS
  Administered 2022-05-25 (×2): 4 mg via INTRAVENOUS
  Administered 2022-05-25: 2 mg via INTRAVENOUS
  Administered 2022-05-25: 4 mg via INTRAVENOUS
  Administered 2022-05-25: 2 mg via INTRAVENOUS
  Administered 2022-05-26: 1 mg via INTRAVENOUS
  Administered 2022-05-26: 4 mg via INTRAVENOUS
  Administered 2022-05-26: 3 mg via INTRAVENOUS
  Administered 2022-05-26 (×2): 2 mg via INTRAVENOUS
  Administered 2022-05-26: 4 mg via INTRAVENOUS
  Administered 2022-05-27 (×3): 2 mg via INTRAVENOUS
  Administered 2022-05-27: 3 mg via INTRAVENOUS
  Administered 2022-05-28: 1 mg via INTRAVENOUS
  Filled 2022-05-25: qty 2
  Filled 2022-05-25: qty 1
  Filled 2022-05-25: qty 2
  Filled 2022-05-25 (×2): qty 1
  Filled 2022-05-25: qty 2
  Filled 2022-05-25 (×2): qty 1
  Filled 2022-05-25: qty 2
  Filled 2022-05-25 (×7): qty 1
  Filled 2022-05-25 (×2): qty 2
  Filled 2022-05-25: qty 1

## 2022-05-25 MED ORDER — THIAMINE HCL 100 MG/ML IJ SOLN
100.0000 mg | Freq: Every day | INTRAMUSCULAR | Status: DC
  Administered 2022-05-25: 100 mg via INTRAVENOUS

## 2022-05-25 MED ORDER — ACETAMINOPHEN 10 MG/ML IV SOLN
1000.0000 mg | Freq: Once | INTRAVENOUS | Status: AC
  Administered 2022-05-25: 1000 mg via INTRAVENOUS
  Filled 2022-05-25: qty 100

## 2022-05-25 MED ORDER — FUROSEMIDE 10 MG/ML IJ SOLN
40.0000 mg | Freq: Once | INTRAMUSCULAR | Status: AC
  Administered 2022-05-25: 40 mg via INTRAVENOUS
  Filled 2022-05-25: qty 4

## 2022-05-25 MED ORDER — LORAZEPAM 1 MG PO TABS
1.0000 mg | ORAL_TABLET | ORAL | Status: AC | PRN
  Administered 2022-05-27 (×2): 2 mg via ORAL
  Filled 2022-05-25 (×2): qty 2

## 2022-05-25 MED ORDER — SODIUM PHOSPHATES 45 MMOLE/15ML IV SOLN
30.0000 mmol | Freq: Once | INTRAVENOUS | Status: AC
  Administered 2022-05-25: 30 mmol via INTRAVENOUS
  Filled 2022-05-25 (×2): qty 10

## 2022-05-25 MED ORDER — SODIUM CHLORIDE 0.9 % IV SOLN
2.0000 g | Freq: Every day | INTRAVENOUS | Status: DC
  Administered 2022-05-25 – 2022-05-26 (×2): 2 g via INTRAVENOUS
  Filled 2022-05-25 (×2): qty 20

## 2022-05-25 MED ORDER — SODIUM CHLORIDE 0.9% IV SOLUTION: Freq: Once | INTRAVENOUS | Status: AC

## 2022-05-25 NOTE — Progress Notes (Signed)
PROGRESS NOTE    [name]  [id number] DOB: 09-Oct-1970 DOA: 05/22/2022 PCP: [name], MD    Chief Complaint  Patient presents with   Alcohol Problem    Brief Narrative:  HPI per Dr. Mylo Lewis [name] is a 52 y.o. male with medical history significant of ongoing EtOH abuse, cirrhosis, HTN, DM2, portal hypertension with pancytopenia.   Pt in to ED at med center with c/o recurrent falls in setting of ongoing drinking.  He cannot tell me how much she is drinking. He does want to stop drinking and he said he has been here before and we prescribed him Librium which helped. He has various bruises over his body including a large bruise on his right flank. He thinks this is probably from falling.    Assessment & Plan:  Principal Problem:   Other pancytopenia ([place]) Active Problems:   Alcohol abuse   Cirrhosis of liver (HCC)   Liver mass   Hypomagnesemia   DM (diabetes mellitus), type 2 (HCC)   History of eye trauma   Hypertension   Hypokalemia   Benign prostatic hyperplasia (BPH) with straining on urination   Acute alcoholic intoxication without complication (HCC)   Hematoma of right flank   Hypophosphatemia   Hyponatremia   Hypoxia    Assessment and Plan: * Other pancytopenia (HCC) Pt with chronic pancytopenia and severe thrombocytopenia that is believed to be "secondary to splenomegaly and alcohol abuse " as per oncologist Dr. Burr Medico note in Oct 2023. -Platelet count noted at 11K this morning. -Patient status post 2 units of platelets 05/23/2022, 1 unit platelet 05/24/2022.. -Patient with multiple areas of ecchymosis/bruising however denies any overt bleeding. -Anemia panel consistent with anemia of chronic disease.  Iron level of 29, ferritin of 45, folate of 39.7, vitamin B12 of 706. -Continue PPI IV twice daily. -Transfuse a unit of platelets. -Patient seen by hematology/oncology who feels patient's pancytopenia with worsening thrombocytopenia  secondary to liver cirrhosis and alcohol toxicity induced bone marrow suppression. -Hematology/oncology recommending transfusion of a unit of platelets daily if platelet count < 15K. -Appreciate hematology input and recommendations.  Liver mass 4.4cm (previously 4.6cm) heterogenous lesion of anterior R lobe of liver.  Biopsy in Oct 2023 was neg for [facility] (just showed primarily necrotic tissue). Doesn't look to have enlarged on todays CT scan (actually was 4.6cm previously, 4.4cm today). Also has 1.6cm hypervascular lesion of L lobe of liver.  Looks like this was 1.0cm on prior CT, but they didn't mention it at all on MRI previously. 4.4cm lesion with neg pathology, doesn't seem to be enlarging on serial imaging exams. 1.6cm hypervascular lesion of L lobe of liver of unclear significance Hematology/oncology consulted and reviewed imaging and recommended to follow for now with outpatient liver MRI in 2 to 3 months.    Cirrhosis of liver (HCC) Pt with EtOH cirrhosis of liver, unfortunately continues to drink heavily. Mild transaminitis that looks to be baseline. Doesn't seem to be on any diuretics chronically, no lactulose, etc. -Patient started on Lasix 20 mg daily and spironolactone 25 mg daily. -Follow electrolytes. -Will need outpatient follow-up with GI postdischarge.  Alcohol abuse Pt with EtOH intoxication on presentation noted to have alcohol level of 415.   -Patient states drinks about 20 beers daily and has been drinking since age 66.   -Patient states he is ready for alcohol cessation and will not drink again posthospitalization.   -Patient noted to be tachycardic, some clamminess/diaphoretic on exam, per RN when  patient stands up with significant tremors noted. -Continue Librium detox protocol.   -Change Ativan CIWA protocol to stepdown CIWA protocol. -Continue thiamine, folic acid, multivitamin.   History of eye trauma Pt has h/o traumatic injury to R eye in March 2023, with  chronic dilated pupil and very poor vision in that eye ever since.  Not on any eye-drops chronically.  He confirms that none of this is new or changed recently though and review of 06/01/21 ED visit note and 11/26/21 optho appointment note seem to confirm that this is a chronic issue.  DM (diabetes mellitus), type 2 (HCC) - Hemoglobin A1c 7.9  (04/03/2022 ) -CBG 165 this morning. -Continue sliding scale insulin.    Hypomagnesemia - Mag level noted at 1.6 on presentation, noted to have received magnesium in the ED.   -Repeat magnesium level at 1.6 this morning -Magnesium sulfate 4 g IV x 1. -Repeat labs in the AM.  Hypoxia -Patient noted to be hypoxic overnight per RN requiring 2 L nasal cannula. -Per RN patient with some bouts of apnea noted when sleeping, however when awake sats noted to be greater than 94% on room air. -Concern for possible OSA. -Patient also noted to be receiving platelets during the hospitalization. -Check a chest x-ray. -Follow-up. -Will likely need outpatient evaluation for OSA.  Hyponatremia -Patient with a sodium level of 127 this morning, was 124 on admission. -Patient does not look volume overloaded on examination however has been receiving some platelets during this hospitalization. -Patient also started on Lasix and spironolactone during this hospitalization. -Check a UA, urine sodium, urine creatinine, serum osmolality, urine osmolality. -Follow.  Hypophosphatemia Phosphorus level at 2.0 this morning. -Sodium phosphate 30 mmol IV x 1. -Repeat labs in the AM.  Hematoma of right flank -Patient with large hematoma of the right flank area complaining of pain. -Secondary to fall in the setting of pancytopenia with severe thrombocytopenia. -Warm compresses. -Pain management.  Benign prostatic hyperplasia (BPH) with straining on urination -Continue home regimen Flomax.   Hypokalemia -Magnesium at 1.6 this morning, potassium at 3.5.   -K-Dur 40 mEq p.o.  x 1.   -Magnesium sulfate 4 g IV x 1.   -Repeat labs in the morning.   Hypertension Patient with elevated blood pressure likely component of alcohol abuse/withdrawal. -Continue Norvasc 10 mg daily, Lasix, spironolactone which was started for cirrhosis.  Continue home regimen Flomax..  -IV hydralazine as needed.         DVT prophylaxis: SCDs Code Status: Full Family Communication: Updated patient.  No family at bedside. Disposition: Remain in stepdown unit.  Likely home when clinically improved.  Status is: Inpatient Remains inpatient appropriate because: Severity of illness   Consultants:  Hematology/oncology: Dr. Burr Medico 05/23/2022  Procedures:  CT abdomen and pelvis 05/22/2022 Transfusion 2 units platelets 05/23/2022 Transfusion 1 unit platelet  05/24/2022 Transfuse 1 unit platelets pending 05/25/2022 Chest x-ray pending  Antimicrobials:  None   Subjective: Laying in bed on 2 L nasal cannula.  Per RN patient noted to have some bouts of hypoxia while sleeping overnight and concern for possible apneic episodes however per RN when awake and alert O2 sats greater than 94%.  Per RN patient when standing up with significant tremors and tachycardia.  Patient somewhat diaphoretic this morning.  Patient denies any chest pain or shortness of breath.  States right flank pain currently controlled on current pain regimen.  Denies any bleeding.   Objective: Vitals:   05/25/22 0749 05/25/22 0800 05/25/22 0815 05/25/22 [id number]  BP:  (!) 163/81  (!) 153/71  Pulse:  (!) 117 (!) 123 (!) 121  Resp:  15 16 (!) 24  Temp:   98.7 F (37.1 C) 99 F (37.2 C)  TempSrc:   Oral Oral  SpO2:  97% 96% 91%  Weight: 70.2 kg     Height:        Intake/Output Summary (Last 24 hours) at 05/25/2022 1000 Last data filed at 05/25/2022 0600 Gross per 24 hour  Intake 326.5 ml  Output 1375 ml  Net -1048.5 ml   Filed Weights   05/22/22 1833 05/23/22 0042 05/25/22 0749  Weight: 72.6 kg 70.1 kg 70.2 kg     Examination:  General exam: NAD.  On 2 L nasal cannula.  Somewhat clammy. Respiratory system: CTAB.  No wheezes, no crackles, no rhonchi.  Fair air movement.  Speaking in full sentences.  Cardiovascular system: Tachycardia.  No murmurs rubs or gallops.  No JVD.  No lower extremity edema.   Gastrointestinal system: Abdomen is soft, minimally distended, positive bowel sounds.  No rebound.  No guarding.  Central nervous system: Alert and oriented. No focal neurological deficits. Extremities: Symmetric 5 x 5 power. Skin: Multiple areas of ecchymosis over his skin including large area of ecchymosis in the right flank/lower back region.  Left upper extremity ecchymosis.  Left lower extremity ecchymosis.  Psychiatry: Judgement and insight appear normal. Mood & affect appropriate.     Data Reviewed:   CBC: Recent Labs  Lab 05/23/22 1200 05/23/22 1955 05/24/22 0249 05/24/22 1358 05/25/22 0304  WBC 2.9* 2.3* 1.8* 2.1* 1.5*  NEUTROABS 2.0 1.5* 1.2* 1.5* 1.1*  HGB 9.1* 9.1* 9.0* 8.8* 9.7*  HCT 27.0* 27.0* 27.3* 26.9* 28.6*  MCV 80.1 81.6 82.5 82.5 81.7  PLT 8* 16* 13* 16* 11*    Basic Metabolic Panel: Recent Labs  Lab 05/22/22 1901 05/23/22 0348 05/24/22 0249 05/25/22 0542  NA 124* 131* 131* 127*  K 3.6 3.6 3.3* 3.5  CL 89* 95* 91* 90*  CO2 19* '24 25 27  '$ GLUCOSE 210* 213* 150* 176*  BUN <5* 5* 7 9  CREATININE 0.49* 0.56* 0.52* 0.73  CALCIUM 7.9* 7.7* 8.2* 8.2*  MG 1.6* 2.1 1.8 1.6*  PHOS  --   --  2.6 2.0*    GFR: Estimated Creatinine Clearance: 99.8 mL/min (by C-G formula based on SCr of 0.73 mg/dL).  Liver Function Tests: Recent Labs  Lab 05/22/22 1901 05/23/22 0348 05/24/22 0249 05/25/22 0542  AST 153* 127* 93* 70*  ALT 114* 100* 83* 68*  ALKPHOS 80 66 70 67  BILITOT 3.1* 3.2* 3.3* 3.2*  PROT 6.6 6.2* 6.0* 6.3*  ALBUMIN 3.8 3.4* 3.4* 3.4*    CBG: Recent Labs  Lab 05/24/22 0813 05/24/22 1212 05/24/22 1717 05/24/22 2130 05/25/22 0813  GLUCAP 222*  182* 166* 153* 165*     Recent Results (from the past 240 hour(s))  MRSA Next Gen by PCR, Nasal     Status: None   Collection Time: 05/23/22 12:26 AM   Specimen: Nasal Mucosa; Nasal Swab  Result Value Ref Range Status   MRSA by PCR Next Gen NOT DETECTED NOT DETECTED Final    Comment: (NOTE) The GeneXpert MRSA Assay (FDA approved for NASAL specimens only), is one component of a comprehensive MRSA colonization surveillance program. It is not intended to diagnose MRSA infection nor to guide or monitor treatment for MRSA infections. Test performance is not FDA approved in patients less than 88 years old. Performed at [facility]  [place] [address], [place], [place] [address]          Radiology Studies: No results found.      Scheduled Meds:  sodium chloride   Intravenous Once   amLODipine  10 mg Oral Daily   chlordiazePOXIDE  25 mg Oral BH-qamhs   Followed by   Derrill Memo ON 05/26/2022] chlordiazePOXIDE  25 mg Oral Daily   Chlorhexidine Gluconate Cloth  6 each Topical Daily   diclofenac Sodium  2 g Topical QID   folic acid  1 mg Oral Daily   furosemide  20 mg Intravenous Once   furosemide  20 mg Oral Daily   insulin aspart  0-15 Units Subcutaneous TID WC   insulin aspart  0-5 Units Subcutaneous QHS   lactulose  30 g Oral Daily   mirtazapine  15 mg Oral QHS   multivitamin with minerals  1 tablet Oral Daily   pantoprazole (PROTONIX) IV  40 mg Intravenous Q12H   spironolactone  25 mg Oral Daily   tamsulosin  0.4 mg Oral QPC supper   thiamine  100 mg Oral Daily   Or   thiamine  100 mg Intravenous Daily   thiamine  100 mg Oral Daily   Or   thiamine  100 mg Intravenous Daily   Continuous Infusions:  magnesium sulfate bolus IVPB 4 g (05/25/22 0913)   sodium phosphate 30 mmol in dextrose 5 % 250 mL infusion       LOS: 2 days    Time spent: 40 minutes    Irine Seal, MD [facility]   To contact the attending provider between 7A-7P or  the covering provider during after hours 7P-7A, please log into the web site [email or web address] and access using universal [place] password for that web site. If you do not have the password, please call the hospital operator.  05/25/2022, 10:00 AM

## 2022-05-25 NOTE — Progress Notes (Signed)
PT Cancellation Note  Patient Details Name: Pinchas Decleene MRN: [id number] DOB: 1970-07-11   Cancelled Treatment:     PT order received but eval deferred.  Pt experiencing ETOH withdrawal.  Will follow when pt stabilizes.   Jessice Madill 05/25/2022, 6:51 AM

## 2022-05-25 NOTE — Assessment & Plan Note (Signed)
Hypervolemic - Continue diuretics and trend

## 2022-05-25 NOTE — Progress Notes (Signed)
PT continues to obstruct with his tongue while sleeping. He has choked himself awake multiple times. Still requiring 2-4L  while asleep. Encouraging patient to sleep on his side. Will need to be monitored closely overnight. I am not sure if he will be able to maintain his own airway with high amount of ativan he requires.

## 2022-05-25 NOTE — Assessment & Plan Note (Addendum)
Respiratory failure ruled out.  Off of oxygen now.  COVID and flu negative.

## 2022-05-25 NOTE — Assessment & Plan Note (Addendum)
-   Repleted 

## 2022-05-25 NOTE — Progress Notes (Signed)
OT Cancellation Note  Patient Details Name: Gaberiel Randolf MRN: [id number] DOB: 02/17/1971   Cancelled Treatment:    Reason Eval/Treat Not Completed: Medical issues which prohibited therapy Patient is experiencing withdrawal symptoms. OT to continue to follow and check back on 05/26/22.  Rennie Plowman, [facility] Office# [phone]  05/25/2022, 6:57 AM

## 2022-05-26 DIAGNOSIS — E871 Hypo-osmolality and hyponatremia: Secondary | ICD-10-CM | POA: Diagnosis not present

## 2022-05-26 DIAGNOSIS — N390 Urinary tract infection, site not specified: Secondary | ICD-10-CM | POA: Diagnosis present

## 2022-05-26 DIAGNOSIS — N3000 Acute cystitis without hematuria: Secondary | ICD-10-CM

## 2022-05-26 DIAGNOSIS — D61818 Other pancytopenia: Secondary | ICD-10-CM | POA: Diagnosis not present

## 2022-05-26 DIAGNOSIS — R509 Fever, unspecified: Secondary | ICD-10-CM

## 2022-05-26 DIAGNOSIS — S301XXA Contusion of abdominal wall, initial encounter: Secondary | ICD-10-CM | POA: Diagnosis not present

## 2022-05-26 LAB — CBC WITH DIFFERENTIAL/PLATELET
Abs Immature Granulocytes: 0.01 10*3/uL (ref 0.00–0.07)
Basophils Absolute: 0 10*3/uL (ref 0.0–0.1)
Basophils Relative: 1 %
Eosinophils Absolute: 0 10*3/uL (ref 0.0–0.5)
Eosinophils Relative: 1 %
HCT: 31.7 % — ABNORMAL LOW (ref 39.0–52.0)
Hemoglobin: 10.7 g/dL — ABNORMAL LOW (ref 13.0–17.0)
Immature Granulocytes: 1 %
Lymphocytes Relative: 11 %
Lymphs Abs: 0.2 10*3/uL — ABNORMAL LOW (ref 0.7–4.0)
MCH: 27.7 pg (ref 26.0–34.0)
MCHC: 33.8 g/dL (ref 30.0–36.0)
MCV: 82.1 fL (ref 80.0–100.0)
Monocytes Absolute: 0.5 10*3/uL (ref 0.1–1.0)
Monocytes Relative: 29 %
Neutro Abs: 0.9 10*3/uL — ABNORMAL LOW (ref 1.7–7.7)
Neutrophils Relative %: 57 %
Platelets: 16 10*3/uL — CL (ref 150–400)
RBC: 3.86 MIL/uL — ABNORMAL LOW (ref 4.22–5.81)
RDW: 19.6 % — ABNORMAL HIGH (ref 11.5–15.5)
WBC: 1.6 10*3/uL — ABNORMAL LOW (ref 4.0–10.5)
nRBC: 0 % (ref 0.0–0.2)

## 2022-05-26 LAB — COMPREHENSIVE METABOLIC PANEL
ALT: 61 U/L — ABNORMAL HIGH (ref 0–44)
AST: 79 U/L — ABNORMAL HIGH (ref 15–41)
Albumin: 3.3 g/dL — ABNORMAL LOW (ref 3.5–5.0)
Alkaline Phosphatase: 64 U/L (ref 38–126)
Anion gap: 12 (ref 5–15)
BUN: 6 mg/dL (ref 6–20)
CO2: 28 mmol/L (ref 22–32)
Calcium: 8.2 mg/dL — ABNORMAL LOW (ref 8.9–10.3)
Chloride: 92 mmol/L — ABNORMAL LOW (ref 98–111)
Creatinine, Ser: 0.57 mg/dL — ABNORMAL LOW (ref 0.61–1.24)
GFR, Estimated: >60 mL/min (ref >60)
Glucose, Bld: 127 mg/dL — ABNORMAL HIGH (ref 70–99)
Potassium: 3 mmol/L — ABNORMAL LOW (ref 3.5–5.1)
Sodium: 132 mmol/L — ABNORMAL LOW (ref 135–145)
Total Bilirubin: 3.2 mg/dL — ABNORMAL HIGH (ref 0.3–1.2)
Total Protein: 6.1 g/dL — ABNORMAL LOW (ref 6.5–8.1)

## 2022-05-26 LAB — BLOOD CULTURE ID PANEL (REFLEXED) - BCID2

## 2022-05-26 LAB — GLUCOSE, CAPILLARY
Glucose-Capillary: 171 mg/dL — ABNORMAL HIGH (ref 70–99)
Glucose-Capillary: 152 mg/dL — ABNORMAL HIGH (ref 70–99)

## 2022-05-26 LAB — PREPARE PLATELET PHERESIS
Unit division: 0
Unit division: 0

## 2022-05-26 LAB — BPAM PLATELET PHERESIS
Blood Product Expiration Date: 202403052359
Blood Product Expiration Date: 202403062359
ISSUE DATE / TIME: 202403021014
ISSUE DATE / TIME: 202403030946
Unit Type and Rh: 5100
Unit Type and Rh: 6200

## 2022-05-26 LAB — AMMONIA: Ammonia: 31 umol/L (ref 9–35)

## 2022-05-26 LAB — PHOSPHORUS: Phosphorus: 3 mg/dL (ref 2.5–4.6)

## 2022-05-26 LAB — TSH: TSH: 1.466 u[IU]/mL (ref 0.350–4.500)

## 2022-05-26 LAB — OSMOLALITY: Osmolality: 270 mOsm/kg — ABNORMAL LOW (ref 275–295)

## 2022-05-26 LAB — MAGNESIUM: Magnesium: 1.8 mg/dL (ref 1.7–2.4)

## 2022-05-26 MED ORDER — CHLORDIAZEPOXIDE HCL 25 MG PO CAPS
25.0000 mg | ORAL_CAPSULE | ORAL | Status: AC
  Administered 2022-05-26 – 2022-05-27 (×2): 25 mg via ORAL
  Filled 2022-05-26 (×2): qty 1

## 2022-05-26 MED ORDER — VANCOMYCIN HCL 1500 MG/300ML IV SOLN
1500.0000 mg | Freq: Once | INTRAVENOUS | Status: AC
  Administered 2022-05-26: 1500 mg via INTRAVENOUS
  Filled 2022-05-26: qty 300

## 2022-05-26 MED ORDER — VANCOMYCIN HCL IN DEXTROSE 1-5 GM/200ML-% IV SOLN: 1000.0000 mg | Freq: Two times a day (BID) | INTRAVENOUS | Status: DC

## 2022-05-26 MED ORDER — CHLORDIAZEPOXIDE HCL 25 MG PO CAPS: 25.0000 mg | ORAL_CAPSULE | Freq: Every day | ORAL | Status: DC

## 2022-05-26 MED ORDER — ACETAMINOPHEN 10 MG/ML IV SOLN
1000.0000 mg | Freq: Once | INTRAVENOUS | Status: AC
  Administered 2022-05-26: 1000 mg via INTRAVENOUS
  Filled 2022-05-26: qty 100

## 2022-05-26 MED ORDER — FUROSEMIDE 10 MG/ML IJ SOLN
40.0000 mg | Freq: Once | INTRAMUSCULAR | Status: AC
  Administered 2022-05-26: 40 mg via INTRAVENOUS
  Filled 2022-05-26: qty 4

## 2022-05-26 MED ORDER — MAGNESIUM SULFATE 2 GM/50ML IV SOLN
2.0000 g | Freq: Once | INTRAVENOUS | Status: AC
  Administered 2022-05-26: 2 g via INTRAVENOUS
  Filled 2022-05-26: qty 50

## 2022-05-26 MED ORDER — POTASSIUM CHLORIDE CRYS ER 10 MEQ PO TBCR
40.0000 meq | EXTENDED_RELEASE_TABLET | ORAL | Status: AC
  Administered 2022-05-26 (×2): 40 meq via ORAL
  Filled 2022-05-26 (×2): qty 4

## 2022-05-26 MED ORDER — CEFAZOLIN SODIUM-DEXTROSE 2-4 GM/100ML-% IV SOLN
2.0000 g | Freq: Three times a day (TID) | INTRAVENOUS | Status: DC
  Administered 2022-05-26 – 2022-05-31 (×16): 2 g via INTRAVENOUS
  Filled 2022-05-26 (×16): qty 100

## 2022-05-26 NOTE — Progress Notes (Signed)
OT Cancellation Note  Patient Details Name: [name] MRN: [id number] DOB: 28-Feb-1971   Cancelled Treatment:    Reason Eval/Treat Not Completed: Medical issues which prohibited therapy Nurse asking for therapy to hold off at this time. OT to continue to follow and check back as schedule will allow Rennie Plowman, [place] [facility] Office# [phone]  05/26/2022, 8:43 AM

## 2022-05-26 NOTE — Consult Note (Signed)
[facility]  Total days of antibiotics 2       Reason for Consult: MSSA bacteremia   Referring Physician: Grandville Silos  Principal Problem:   Other pancytopenia ([place]) Active Problems:   Alcohol abuse   Cirrhosis of liver ([place])   Hypertension   Alcohol withdrawal with delirium ([place])   Hypokalemia   Hypomagnesemia   DM (diabetes mellitus), type 2 (HCC)   Liver mass   Benign prostatic hyperplasia (BPH) with straining on urination   History of eye trauma   Acute alcoholic intoxication without complication ([place])   Hematoma of right flank   Hypophosphatemia   Hyponatremia   Hypoxia   UTI (urinary tract infection)   Fever    HPI: [name] is a 52 y.o. male admitted for alcohol withdrawal but started to have fevers on hospital day 3 which led to infectious work up with worsening leukopenia found to have disseminated mssa infection ( bacteremia and + urine cx) he was initially started on broad spectrum abtx. He denies any pain at PIV sites  Past Medical History:  Diagnosis Date   Alcoholic ([place])    Anxiety    Diabetes ([place])    DVT (deep venous thrombosis) (HCC)    Hypertension     Allergies: No Known Allergies  MEDICATIONS:  amLODipine  10 mg Oral Daily   chlordiazePOXIDE  25 mg Oral BH-qamhs   Followed by   Derrill Memo ON 05/27/2022] chlordiazePOXIDE  25 mg Oral Daily   Chlorhexidine Gluconate Cloth  6 each Topical Daily   diclofenac Sodium  2 g Topical QID   folic acid  1 mg Oral Daily   furosemide  20 mg Oral Daily   insulin aspart  0-15 Units Subcutaneous TID WC   insulin aspart  0-5 Units Subcutaneous QHS   lactulose  30 g Oral Daily   mirtazapine  15 mg Oral QHS   multivitamin with minerals  1 tablet Oral Daily   pantoprazole (PROTONIX) IV  40 mg Intravenous Q12H   spironolactone  25 mg Oral Daily   tamsulosin  0.4 mg Oral QPC supper   thiamine  100 mg Oral Daily   Or   thiamine  100 mg Intravenous Daily    Social History    Tobacco Use   Smoking status: Never   Smokeless tobacco: Never  Vaping Use   Vaping Use: Never used  Substance Use Topics   Alcohol use: Yes    Alcohol/week: 100.0 standard drinks of alcohol    Types: 100 Cans of beer per week    Comment: last consumed 2 beers 05/22/22 AM   Drug use: No    Family History  Problem Relation Age of Onset   Hypertension Father     Review of Systems  Constitutional: Negative for fever, chills, diaphoresis, activity change, appetite change, fatigue and unexpected weight change.  HENT: Negative for congestion, sore throat, rhinorrhea, sneezing, trouble swallowing and sinus pressure.  Eyes: Negative for photophobia and visual disturbance.  Respiratory: Negative for cough, chest tightness, shortness of breath, wheezing and stridor.  Cardiovascular: Negative for chest pain, palpitations and leg swelling.  Gastrointestinal: Negative for nausea, vomiting, abdominal pain, diarrhea, constipation, blood in stool, abdominal distention and anal bleeding.  Genitourinary: Negative for dysuria, hematuria, flank pain and difficulty urinating.  Musculoskeletal: Negative for myalgias, back pain, joint swelling, arthralgias and gait problem.  Skin: Negative for color change, pallor, rash and wound.  Neurological: Negative for dizziness, tremors, weakness and  light-headedness.  Hematological: Negative for adenopathy. Does not bruise/bleed easily.  Psychiatric/Behavioral: Negative for behavioral problems, confusion, sleep disturbance, dysphoric mood, decreased concentration and agitation.    OBJECTIVE: Temp:  [98.7 F (37.1 C)-103 F (39.4 C)] 99.2 F (37.3 C) (03/04 1215) Pulse Rate:  [85-130] 116 (03/04 1300) Resp:  [12-28] 23 (03/04 1300) BP: (117-160)/(60-106) 146/77 (03/04 1200) SpO2:  [87 %-99 %] 94 % (03/04 1300) Weight:  [68.2 kg] 68.2 kg (03/04 0701) Physical Exam  Constitutional: He is oriented to person, place, and time. He appears disheveled,  well-developed and well-nourished. No distress.  HENT:  Mouth/Throat: Oropharynx is clear and moist. No oropharyngeal exudate.  Cardiovascular: Normal rate, regular rhythm and normal heart sounds. Exam reveals no gallop and no friction rub.  No murmur heard.  Pulmonary/Chest: Effort normal and breath sounds normal. No respiratory distress. He has no wheezes.  Abdominal: Soft. Bowel sounds are normal. He exhibits no distension. There is no tenderness.  Lymphadenopathy:  He has no cervical adenopathy.  Neurological: He is alert and oriented to person, place, and time.  Skin: Skin is warm and dry. No rash noted. No erythema. Scattered echymosis Psychiatric: He has a normal mood and affect. His behavior is normal.    LABS: Results for orders placed or performed during the hospital encounter of 05/22/22 (from the past 48 hour(s))  CBC with Differential/Platelet     Status: Abnormal   Collection Time: 05/24/22  1:58 PM  Result Value Ref Range   WBC 2.1 (L) 4.0 - 10.5 K/uL   RBC 3.26 (L) 4.22 - 5.81 MIL/uL   Hemoglobin 8.8 (L) 13.0 - 17.0 g/dL   HCT 26.9 (L) 39.0 - 52.0 %   MCV 82.5 80.0 - 100.0 fL   MCH 27.0 26.0 - 34.0 pg   MCHC 32.7 30.0 - 36.0 g/dL   RDW 19.2 (H) 11.5 - 15.5 %   Platelets 16 (LL) 150 - 400 K/uL    Comment: SPECIMEN CHECKED FOR CLOTS Immature Platelet Fraction may be clinically indicated, consider ordering this additional test [id number] CONSISTENT WITH PREVIOUS RESULT CRITICAL VALUE NOTED.  VALUE IS CONSISTENT WITH PREVIOUSLY REPORTED AND CALLED VALUE.    nRBC 0.0 0.0 - 0.2 %   Neutrophils Relative % 70 %   Neutro Abs 1.5 (L) 1.7 - 7.7 K/uL   Lymphocytes Relative 10 %   Lymphs Abs 0.2 (L) 0.7 - 4.0 K/uL   Monocytes Relative 17 %   Monocytes Absolute 0.4 0.1 - 1.0 K/uL   Eosinophils Relative 1 %   Eosinophils Absolute 0.0 0.0 - 0.5 K/uL   Basophils Relative 1 %   Basophils Absolute 0.0 0.0 - 0.1 K/uL   Immature Granulocytes 1 %   Abs Immature Granulocytes  0.02 0.00 - 0.07 K/uL   Polychromasia PRESENT     Comment: Performed at [facility], [place] [address] [place], [place] [address]  Glucose, capillary     Status: Abnormal   Collection Time: 05/24/22  5:17 PM  Result Value Ref Range   Glucose-Capillary 166 (H) 70 - 99 mg/dL    Comment: Glucose reference range applies only to samples taken after fasting for at least 8 hours.   Comment 1 Notify RN    Comment 2 Document in Chart   Glucose, capillary     Status: Abnormal   Collection Time: 05/24/22  9:30 PM  Result Value Ref Range   Glucose-Capillary 153 (H) 70 - 99 mg/dL    Comment: Glucose reference range applies  only to samples taken after fasting for at least 8 hours.  CBC with Differential/Platelet     Status: Abnormal   Collection Time: 05/25/22  3:04 AM  Result Value Ref Range   WBC 1.5 (L) 4.0 - 10.5 K/uL   RBC 3.50 (L) 4.22 - 5.81 MIL/uL   Hemoglobin 9.7 (L) 13.0 - 17.0 g/dL   HCT 28.6 (L) 39.0 - 52.0 %   MCV 81.7 80.0 - 100.0 fL   MCH 27.7 26.0 - 34.0 pg   MCHC 33.9 30.0 - 36.0 g/dL   RDW 19.5 (H) 11.5 - 15.5 %   Platelets 11 (LL) 150 - 400 K/uL    Comment: SPECIMEN CHECKED FOR CLOTS Immature Platelet Fraction may be clinically indicated, consider ordering this additional test [id number] CRITICAL VALUE NOTED.  VALUE IS CONSISTENT WITH PREVIOUSLY REPORTED AND CALLED VALUE. REPEATED TO VERIFY    nRBC 0.0 0.0 - 0.2 %   Neutrophils Relative % 74 %   Neutro Abs 1.1 (L) 1.7 - 7.7 K/uL   Lymphocytes Relative 10 %   Lymphs Abs 0.1 (L) 0.7 - 4.0 K/uL   Monocytes Relative 14 %   Monocytes Absolute 0.2 0.1 - 1.0 K/uL   Eosinophils Relative 1 %   Eosinophils Absolute 0.0 0.0 - 0.5 K/uL   Basophils Relative 0 %   Basophils Absolute 0.0 0.0 - 0.1 K/uL   Immature Granulocytes 1 %   Abs Immature Granulocytes 0.02 0.00 - 0.07 K/uL    Comment: Performed at [facility], [place] [address], [place], [place] [address]  Comprehensive metabolic panel      Status: Abnormal   Collection Time: 05/25/22  5:42 AM  Result Value Ref Range   Sodium 127 (L) 135 - 145 mmol/L   Potassium 3.5 3.5 - 5.1 mmol/L   Chloride 90 (L) 98 - 111 mmol/L   CO2 27 22 - 32 mmol/L   Glucose, Bld 176 (H) 70 - 99 mg/dL    Comment: Glucose reference range applies only to samples taken after fasting for at least 8 hours.   BUN 9 6 - 20 mg/dL   Creatinine, Ser 0.73 0.61 - 1.24 mg/dL   Calcium 8.2 (L) 8.9 - 10.3 mg/dL   Total Protein 6.3 (L) 6.5 - 8.1 g/dL   Albumin 3.4 (L) 3.5 - 5.0 g/dL   AST 70 (H) 15 - 41 U/L   ALT 68 (H) 0 - 44 U/L   Alkaline Phosphatase 67 38 - 126 U/L   Total Bilirubin 3.2 (H) 0.3 - 1.2 mg/dL   GFR, Estimated >60 >60 mL/min    Comment: (NOTE) Calculated using the CKD-EPI Creatinine Equation (2021)    Anion gap 10 5 - 15    Comment: Performed at [facility], [place] [address] [place], [place] [address]  Magnesium     Status: Abnormal   Collection Time: 05/25/22  5:42 AM  Result Value Ref Range   Magnesium 1.6 (L) 1.7 - 2.4 mg/dL    Comment: Performed at [facility], [place] [address] [place], [place] [address]  Phosphorus     Status: Abnormal   Collection Time: 05/25/22  5:42 AM  Result Value Ref Range   Phosphorus 2.0 (L) 2.5 - 4.6 mg/dL    Comment: Performed at [facility], [place] [address], [place], [place] [address]  Sodium, urine, random     Status: None   Collection Time: 05/25/22  7:48 AM  Result Value Ref Range  Sodium, Ur 84 mmol/L    Comment: Performed at [facility], [place] [address], [place], [place] [address]  Creatinine, urine, random     Status: None   Collection Time: 05/25/22  7:48 AM  Result Value Ref Range   Creatinine, Urine 188 mg/dL    Comment: Performed at [facility], [place] [address] [place], [place] [address]  Urinalysis, Routine w reflex microscopic -Urine, Clean Catch     Status: Abnormal   Collection Time:  05/25/22  7:48 AM  Result Value Ref Range   Color, Urine AMBER (A) YELLOW    Comment: BIOCHEMICALS MAY BE AFFECTED BY COLOR   APPearance CLOUDY (A) CLEAR   Specific Gravity, Urine 1.020 1.005 - 1.030   pH 7.0 5.0 - 8.0   Glucose, UA 50 (A) NEGATIVE mg/dL   Hgb urine dipstick MODERATE (A) NEGATIVE   Bilirubin Urine NEGATIVE NEGATIVE   Ketones, ur 20 (A) NEGATIVE mg/dL   Protein, ur 30 (A) NEGATIVE mg/dL   Nitrite POSITIVE (A) NEGATIVE   Leukocytes,Ua LARGE (A) NEGATIVE   RBC / HPF >50 0 - 5 RBC/hpf   WBC, UA >50 0 - 5 WBC/hpf   Bacteria, UA RARE (A) NONE SEEN   Squamous Epithelial / HPF 0-5 0 - 5 /HPF   WBC Clumps PRESENT    Mucus PRESENT    Hyaline Casts, UA PRESENT     Comment: Performed at [facility], [place] [address], [place], [place] [address]  Osmolality, urine     Status: None   Collection Time: 05/25/22  7:48 AM  Result Value Ref Range   Osmolality, Ur 685 300 - 900 mOsm/kg    Comment: Performed at [place] [address] [place], [place] [address]  Prepare platelet pheresis     Status: None   Collection Time: 05/25/22  7:50 AM  Result Value Ref Range   Unit Number [id number]    Blood Component Type PLTP2 PSORALEN TREATED    Unit division 00    Status of Unit ISSUED,FINAL    Transfusion Status      OK TO TRANSFUSE Performed at [place] [address], [place], [place] [address]   Glucose, capillary     Status: Abnormal   Collection Time: 05/25/22  8:13 AM  Result Value Ref Range   Glucose-Capillary 165 (H) 70 - 99 mg/dL    Comment: Glucose reference range applies only to samples taken after fasting for at least 8 hours.   Comment 1 Notify RN   Brain natriuretic peptide     Status: Abnormal   Collection Time: 05/25/22  8:30 AM  Result Value Ref Range   B Natriuretic Peptide 110.3 (H) 0.0 - 100.0 pg/mL    Comment: Performed at [facility], [place] [address] [place], [place] [address]   Osmolality     Status: Abnormal   Collection Time: 05/25/22  8:30 AM  Result Value Ref Range   Osmolality 270 (L) 275 - 295 mOsm/kg    Comment: REPEATED TO VERIFY PERFORMED AT [facility] Performed at [facility], [place] [address], [place], [place] [address]   Glucose, capillary     Status: Abnormal   Collection Time: 05/25/22 12:22 PM  Result Value Ref Range   Glucose-Capillary 178 (H) 70 - 99 mg/dL    Comment: Glucose reference range applies only to samples taken after fasting for at least 8 hours.   Comment  1 Notify RN   Urine Culture (for pregnant, neutropenic or urologic patients or patients with an indwelling urinary catheter)     Status: Abnormal (Preliminary result)   Collection Time: 05/25/22 12:48 PM   Specimen: Urine, Clean Catch  Result Value Ref Range   Specimen Description      URINE, CLEAN CATCH Performed at [facility], [place] [address], [place], [place] [address]    Special Requests      NONE Performed at [facility], [place] [address], [place], [place] [address]    Culture (A)     >=100,000 COLONIES/mL STAPHYLOCOCCUS AUREUS SUSCEPTIBILITIES TO FOLLOW Performed at [place] [address] [place], [place] [address]    Report Status PENDING   Culture, blood (Routine X 2) w Reflex to ID Panel     Status: None (Preliminary result)   Collection Time: 05/25/22  2:25 PM   Specimen: BLOOD  Result Value Ref Range   Specimen Description      BLOOD BLOOD RIGHT ARM BLOOD RIGHT HAND Performed at [place] [address], [place], [place] [address]    Special Requests      BOTTLES DRAWN AEROBIC AND ANAEROBIC Blood Culture adequate volume Performed at [place] [address] [place], [place] [address]    Culture      NO GROWTH < 24 HOURS Performed at [place] [address], [place], [place] [address]    Report Status PENDING   Culture, blood  (Routine X 2) w Reflex to ID Panel     Status: None (Preliminary result)   Collection Time: 05/25/22  2:26 PM   Specimen: BLOOD  Result Value Ref Range   Specimen Description      BLOOD BLOOD LEFT ARM BLOOD LEFT HAND Performed at [place] [address], [place], [place] [address]    Special Requests      BOTTLES DRAWN AEROBIC AND ANAEROBIC Blood Culture adequate volume Performed at [place] [address], [place], [place] [address]    Culture  Setup Time      GRAM POSITIVE COCCI IN CLUSTERS ANAEROBIC BOTTLE ONLY Organism ID to follow CRITICAL RESULT CALLED TO, READ BACK BY AND VERIFIED WITH:  C/ PHARMD J. GADHIA 05/26/22 0950 A. LAFRANCE Performed at [facility], [place] [address], [place], [place] [address]    Culture GRAM POSITIVE COCCI    Report Status PENDING   Blood Culture ID Panel (Reflexed)     Status: Abnormal   Collection Time: 05/25/22  2:26 PM  Result Value Ref Range   Enterococcus faecalis NOT DETECTED NOT DETECTED   Enterococcus Faecium NOT DETECTED NOT DETECTED   Listeria monocytogenes NOT DETECTED NOT DETECTED   Staphylococcus species DETECTED (A) NOT DETECTED    Comment: CRITICAL RESULT CALLED TO, READ BACK BY AND VERIFIED WITH:  C/ PHARMD J. GADHIA 05/26/22 0950 A. LAFRANCE    Staphylococcus aureus (BCID) DETECTED (A) NOT DETECTED    Comment: CRITICAL RESULT CALLED TO, READ BACK BY AND VERIFIED WITH:  C/ PHARMD J. GADHIA 05/26/22 0950 A. LAFRANCE    Staphylococcus epidermidis NOT DETECTED NOT DETECTED   Staphylococcus lugdunensis NOT DETECTED NOT DETECTED   Streptococcus species NOT DETECTED NOT DETECTED   Streptococcus agalactiae NOT DETECTED NOT DETECTED   Streptococcus pneumoniae NOT DETECTED NOT DETECTED   Streptococcus pyogenes NOT DETECTED NOT DETECTED   A.calcoaceticus-baumannii NOT DETECTED NOT DETECTED  Bacteroides fragilis NOT DETECTED NOT DETECTED   Enterobacterales NOT DETECTED NOT  DETECTED   Enterobacter cloacae complex NOT DETECTED NOT DETECTED   Escherichia coli NOT DETECTED NOT DETECTED   Klebsiella aerogenes NOT DETECTED NOT DETECTED   Klebsiella oxytoca NOT DETECTED NOT DETECTED   Klebsiella pneumoniae NOT DETECTED NOT DETECTED   Proteus species NOT DETECTED NOT DETECTED   Salmonella species NOT DETECTED NOT DETECTED   Serratia marcescens NOT DETECTED NOT DETECTED   Haemophilus influenzae NOT DETECTED NOT DETECTED   Neisseria meningitidis NOT DETECTED NOT DETECTED   Pseudomonas aeruginosa NOT DETECTED NOT DETECTED   Stenotrophomonas maltophilia NOT DETECTED NOT DETECTED   Candida albicans NOT DETECTED NOT DETECTED   Candida auris NOT DETECTED NOT DETECTED   Candida glabrata NOT DETECTED NOT DETECTED   Candida krusei NOT DETECTED NOT DETECTED   Candida parapsilosis NOT DETECTED NOT DETECTED   Candida tropicalis NOT DETECTED NOT DETECTED   Cryptococcus neoformans/gattii NOT DETECTED NOT DETECTED   Meth resistant mecA/C and MREJ NOT DETECTED NOT DETECTED    Comment: Performed at [place] [address], [place], [place] [address]  CBC with Differential/Platelet     Status: Abnormal   Collection Time: 05/25/22  3:07 PM  Result Value Ref Range   WBC 1.2 (LL) 4.0 - 10.5 K/uL    Comment: This critical result has verified and been called to [facility], S by Sheryn Bison on 03 03 2024 at 1604, and has been read back.    RBC 2.99 (L) 4.22 - 5.81 MIL/uL   Hemoglobin 8.2 (L) 13.0 - 17.0 g/dL   HCT 24.8 (L) 39.0 - 52.0 %   MCV 82.9 80.0 - 100.0 fL   MCH 27.4 26.0 - 34.0 pg   MCHC 33.1 30.0 - 36.0 g/dL   RDW 19.4 (H) 11.5 - 15.5 %   Platelets 15 (LL) 150 - 400 K/uL    Comment: Immature Platelet Fraction may be clinically indicated, consider ordering this additional test [id number] THIS CRITICAL RESULT HAS VERIFIED AND BEEN CALLED TO [facility], S BY XIONG,KONG ON 03 03 2024 AT 1604, AND HAS BEEN READ BACK.     nRBC 0.0 0.0 - 0.2 %   Neutrophils Relative % 59 %    Neutro Abs 0.7 (L) 1.7 - 7.7 K/uL   Lymphocytes Relative 12 %   Lymphs Abs 0.1 (L) 0.7 - 4.0 K/uL   Monocytes Relative 28 %   Monocytes Absolute 0.3 0.1 - 1.0 K/uL   Eosinophils Relative 0 %   Eosinophils Absolute 0.0 0.0 - 0.5 K/uL   Basophils Relative 0 %   Basophils Absolute 0.0 0.0 - 0.1 K/uL   Immature Granulocytes 1 %   Abs Immature Granulocytes 0.01 0.00 - 0.07 K/uL    Comment: Performed at [facility], [place] [address] [place], [place] [address]  Glucose, capillary     Status: Abnormal   Collection Time: 05/25/22  3:36 PM  Result Value Ref Range   Glucose-Capillary 124 (H) 70 - 99 mg/dL    Comment: Glucose reference range applies only to samples taken after fasting for at least 8 hours.   Comment 1 Notify RN   Resp panel by RT-PCR (RSV, Flu A&B, Covid) Anterior Nasal Swab     Status: None   Collection Time: 05/25/22  8:17 PM   Specimen: Anterior Nasal Swab  Result Value Ref Range   SARS Coronavirus 2 by RT PCR NEGATIVE NEGATIVE    Comment: (NOTE) SARS-CoV-2 target nucleic  acids are NOT DETECTED.  The SARS-CoV-2 RNA is generally detectable in upper respiratory specimens during the acute phase of infection. The lowest concentration of SARS-CoV-2 viral copies this assay can detect is 138 copies/mL. A negative result does not preclude SARS-Cov-2 infection and should not be used as the sole basis for treatment or other patient management decisions. A negative result may occur with  improper specimen collection/handling, submission of specimen other than nasopharyngeal swab, presence of viral mutation(s) within the areas targeted by this assay, and inadequate number of viral copies(<138 copies/mL). A negative result must be combined with clinical observations, patient history, and epidemiological information. The expected result is Negative.  Fact Sheet for Patients:  EntrepreneurPulse.com.au  Fact Sheet for Healthcare Providers:   IncredibleEmployment.be  This test is no t yet approved or cleared by the [place] FDA and  has been authorized for detection and/or diagnosis of SARS-CoV-2 by FDA under an Emergency Use Authorization (EUA). This EUA will remain  in effect (meaning this test can be used) for the duration of the COVID-19 declaration under Section 564(b)(1) of the Act, 21 U.S.C.section 360bbb-3(b)(1), unless the authorization is terminated  or revoked sooner.       Influenza A by PCR NEGATIVE NEGATIVE   Influenza B by PCR NEGATIVE NEGATIVE    Comment: (NOTE) The Xpert Xpress SARS-CoV-2/FLU/RSV plus assay is intended as an aid in the diagnosis of influenza from Nasopharyngeal swab specimens and should not be used as a sole basis for treatment. Nasal washings and aspirates are unacceptable for Xpert Xpress SARS-CoV-2/FLU/RSV testing.  Fact Sheet for Patients: EntrepreneurPulse.com.au  Fact Sheet for Healthcare Providers: IncredibleEmployment.be  This test is not yet approved or cleared by the [place] FDA and has been authorized for detection and/or diagnosis of SARS-CoV-2 by FDA under an Emergency Use Authorization (EUA). This EUA will remain in effect (meaning this test can be used) for the duration of the COVID-19 declaration under Section 564(b)(1) of the Act, 21 U.S.C. section 360bbb-3(b)(1), unless the authorization is terminated or revoked.     Resp Syncytial Virus by PCR NEGATIVE NEGATIVE    Comment: (NOTE) Fact Sheet for Patients: EntrepreneurPulse.com.au  Fact Sheet for Healthcare Providers: IncredibleEmployment.be  This test is not yet approved or cleared by the [place] FDA and has been authorized for detection and/or diagnosis of SARS-CoV-2 by FDA under an Emergency Use Authorization (EUA). This EUA will remain in effect (meaning this test can be used) for the duration of  the COVID-19 declaration under Section 564(b)(1) of the Act, 21 U.S.C. section 360bbb-3(b)(1), unless the authorization is terminated or revoked.  Performed at [facility], [place] [address], [place], [place] [address]   Glucose, capillary     Status: Abnormal   Collection Time: 05/25/22  9:49 PM  Result Value Ref Range   Glucose-Capillary 127 (H) 70 - 99 mg/dL    Comment: Glucose reference range applies only to samples taken after fasting for at least 8 hours.  Magnesium     Status: None   Collection Time: 05/26/22  3:22 AM  Result Value Ref Range   Magnesium 1.8 1.7 - 2.4 mg/dL    Comment: Performed at [facility], [place] [address] [place], [place] [address]  Phosphorus     Status: None   Collection Time: 05/26/22  3:22 AM  Result Value Ref Range   Phosphorus 3.0 2.5 - 4.6 mg/dL    Comment: Performed at [facility], [place] [address]  [place], [place] [address]  Comprehensive metabolic panel     Status: Abnormal   Collection Time: 05/26/22  3:22 AM  Result Value Ref Range   Sodium 132 (L) 135 - 145 mmol/L   Potassium 3.0 (L) 3.5 - 5.1 mmol/L   Chloride 92 (L) 98 - 111 mmol/L   CO2 28 22 - 32 mmol/L   Glucose, Bld 127 (H) 70 - 99 mg/dL    Comment: Glucose reference range applies only to samples taken after fasting for at least 8 hours.   BUN 6 6 - 20 mg/dL   Creatinine, Ser 0.57 (L) 0.61 - 1.24 mg/dL   Calcium 8.2 (L) 8.9 - 10.3 mg/dL   Total Protein 6.1 (L) 6.5 - 8.1 g/dL   Albumin 3.3 (L) 3.5 - 5.0 g/dL   AST 79 (H) 15 - 41 U/L   ALT 61 (H) 0 - 44 U/L   Alkaline Phosphatase 64 38 - 126 U/L   Total Bilirubin 3.2 (H) 0.3 - 1.2 mg/dL   GFR, Estimated >60 >60 mL/min    Comment: (NOTE) Calculated using the CKD-EPI Creatinine Equation (2021)    Anion gap 12 5 - 15    Comment: Performed at [facility], [place] [address], [place], [place] [address]  CBC with Differential/Platelet     Status: Abnormal    Collection Time: 05/26/22  3:22 AM  Result Value Ref Range   WBC 1.6 (L) 4.0 - 10.5 K/uL   RBC 3.86 (L) 4.22 - 5.81 MIL/uL   Hemoglobin 10.7 (L) 13.0 - 17.0 g/dL    Comment: REPEATED TO VERIFY DELTA CHECK NOTED    HCT 31.7 (L) 39.0 - 52.0 %   MCV 82.1 80.0 - 100.0 fL   MCH 27.7 26.0 - 34.0 pg   MCHC 33.8 30.0 - 36.0 g/dL   RDW 19.6 (H) 11.5 - 15.5 %   Platelets 16 (LL) 150 - 400 K/uL    Comment: CRITICAL VALUE NOTED.  VALUE IS CONSISTENT WITH PREVIOUSLY REPORTED AND CALLED VALUE. REPEATED TO VERIFY    nRBC 0.0 0.0 - 0.2 %   Neutrophils Relative % 57 %   Neutro Abs 0.9 (L) 1.7 - 7.7 K/uL   Lymphocytes Relative 11 %   Lymphs Abs 0.2 (L) 0.7 - 4.0 K/uL   Monocytes Relative 29 %   Monocytes Absolute 0.5 0.1 - 1.0 K/uL   Eosinophils Relative 1 %   Eosinophils Absolute 0.0 0.0 - 0.5 K/uL   Basophils Relative 1 %   Basophils Absolute 0.0 0.0 - 0.1 K/uL   Immature Granulocytes 1 %   Abs Immature Granulocytes 0.01 0.00 - 0.07 K/uL   Polychromasia PRESENT    Target Cells PRESENT     Comment: Performed at [facility], [place] [address], [place], [place] [id number]  Glucose, capillary     Status: Abnormal   Collection Time: 05/26/22  8:43 AM  Result Value Ref Range   Glucose-Capillary 171 (H) 70 - 99 mg/dL    Comment: Glucose reference range applies only to samples taken after fasting for at least 8 hours.   Comment 1 Notify RN    Comment 2 Document in Chart   Ammonia     Status: None   Collection Time: 05/26/22 11:25 AM  Result Value Ref Range   Ammonia 31 9 - 35 umol/L    Comment: Performed at [facility], [place] [address] [place], [place] [address]  TSH     Status: None  Collection Time: 05/26/22 11:25 AM  Result Value Ref Range   TSH 1.466 0.350 - 4.500 uIU/mL    Comment: Performed by a 3rd Generation assay with a functional sensitivity of <=0.01 uIU/mL. Performed at [facility], [place] [address], [place], [place]  [address]   Glucose, capillary     Status: Abnormal   Collection Time: 05/26/22 12:24 PM  Result Value Ref Range   Glucose-Capillary 152 (H) 70 - 99 mg/dL    Comment: Glucose reference range applies only to samples taken after fasting for at least 8 hours.    MICRO: 3/3 blood cx - MSSA+ Urine cx - staph aureus IMAGING: DG CHEST PORT 1 VIEW  Result Date: 05/25/2022 CLINICAL DATA:  Hypoxia EXAM: PORTABLE CHEST 1 VIEW COMPARISON:  X-ray 05/22/2022 FINDINGS: Underinflation with enlarged cardiopericardial silhouette and new vascular congestion. No pneumothorax or effusion. No consolidation. Overlapping cardiac leads. Film is rotated. IMPRESSION: Enlarged cardiopericardial silhouette with new vascular congestion. Underinflated x-ray Electronically Signed   By: Jill Side M.D.   On: 05/25/2022 14:06    Assessment/Plan:  52yo M with alcohol withdrawal and hospital onset bacteremia  - will narrow to cefazolin 2gm Iv Q 8hr - will repeat blood cx tomorrow - please get TTE to initiate work up to rule out endocarditis  Fever = anticipate fever curve to improve   Leukopenia = likely due to infection and anticipate to return to baseline

## 2022-05-26 NOTE — Progress Notes (Addendum)
PHARMACY - PHYSICIAN COMMUNICATION CRITICAL VALUE ALERT - BLOOD CULTURE IDENTIFICATION (BCID)  [name] is an 51 y.o. male who presented to [facility] on 05/22/2022 with a chief complaint of alcohol abuse, recurrent falls.  Assessment:  Blood cultures 1/4 bottles: [facility].  BCID result Staphylococcus aureus, no resistance detected.  Urine culture pending for r/o UTI  Name of physician (or Provider) Contacted: Dr Grandville Silos, Dr Verlee Monte  Current antibiotics: Ceftriaxone, Vancomycin   Changes to prescribed antibiotics recommended:  Patient is on recommended antibiotics - No changes needed  Results for orders placed or performed during the hospital encounter of 05/22/22  Blood Culture ID Panel (Reflexed) (Collected: 05/25/2022  2:26 PM)  Result Value Ref Range   Enterococcus faecalis NOT DETECTED NOT DETECTED   Enterococcus Faecium NOT DETECTED NOT DETECTED   Listeria monocytogenes NOT DETECTED NOT DETECTED   Staphylococcus species DETECTED (A) NOT DETECTED   Staphylococcus aureus (BCID) DETECTED (A) NOT DETECTED   Staphylococcus epidermidis NOT DETECTED NOT DETECTED   Staphylococcus lugdunensis NOT DETECTED NOT DETECTED   Streptococcus species NOT DETECTED NOT DETECTED   Streptococcus agalactiae NOT DETECTED NOT DETECTED   Streptococcus pneumoniae NOT DETECTED NOT DETECTED   Streptococcus pyogenes NOT DETECTED NOT DETECTED   A.calcoaceticus-baumannii NOT DETECTED NOT DETECTED   Bacteroides fragilis NOT DETECTED NOT DETECTED   Enterobacterales NOT DETECTED NOT DETECTED   Enterobacter cloacae complex NOT DETECTED NOT DETECTED   Escherichia coli NOT DETECTED NOT DETECTED   Klebsiella aerogenes NOT DETECTED NOT DETECTED   Klebsiella oxytoca NOT DETECTED NOT DETECTED   Klebsiella pneumoniae NOT DETECTED NOT DETECTED   Proteus species NOT DETECTED NOT DETECTED   Salmonella species NOT DETECTED NOT DETECTED   Serratia marcescens NOT DETECTED NOT DETECTED   Haemophilus influenzae NOT  DETECTED NOT DETECTED   Neisseria meningitidis NOT DETECTED NOT DETECTED   Pseudomonas aeruginosa NOT DETECTED NOT DETECTED   Stenotrophomonas maltophilia NOT DETECTED NOT DETECTED   Candida albicans NOT DETECTED NOT DETECTED   Candida auris NOT DETECTED NOT DETECTED   Candida glabrata NOT DETECTED NOT DETECTED   Candida krusei NOT DETECTED NOT DETECTED   Candida parapsilosis NOT DETECTED NOT DETECTED   Candida tropicalis NOT DETECTED NOT DETECTED   Cryptococcus neoformans/gattii NOT DETECTED NOT DETECTED   Meth resistant mecA/C and MREJ NOT DETECTED NOT DETECTED   Gretta Arab PharmD, BCPS [facility] main pharmacy [phone] 05/26/2022 10:39 AM

## 2022-05-26 NOTE — Assessment & Plan Note (Signed)
Seeded by staph bacteremia - See above

## 2022-05-26 NOTE — Progress Notes (Addendum)
PROGRESS NOTE    [name]  [id number] DOB: 18-Sep-1970 DOA: 05/22/2022 PCP: [name], MD    Chief Complaint  Patient presents with   Alcohol Problem    Brief Narrative:  HPI per Dr. Mylo Red [name] is a 52 y.o. male with medical history significant of ongoing EtOH abuse, cirrhosis, HTN, DM2, portal hypertension with pancytopenia.   Pt in to ED at med center with c/o recurrent falls in setting of ongoing drinking.  He cannot tell me how much she is drinking. He does want to stop drinking and he said he has been here before and we prescribed him Librium which helped. He has various bruises over his body including a large bruise on his right flank. He thinks this is probably from falling.    Assessment & Plan:  Principal Problem:   Other pancytopenia ([place]) Active Problems:   Alcohol abuse   Cirrhosis of liver (HCC)   Liver mass   Hypomagnesemia   DM (diabetes mellitus), type 2 (HCC)   History of eye trauma   Hypertension   Alcohol withdrawal with delirium (HCC)   Hypokalemia   Benign prostatic hyperplasia (BPH) with straining on urination   Acute alcoholic intoxication without complication (HCC)   Hematoma of right flank   Hypophosphatemia   Hyponatremia   Hypoxia   UTI (urinary tract infection)   Fever    Assessment and Plan: * Other pancytopenia (HCC) Pt with chronic pancytopenia and severe thrombocytopenia that is believed to be "secondary to splenomegaly and alcohol abuse " as per oncologist Dr. Burr Medico note in Oct 2023. -Platelet count noted at 16 K this morning. -Patient status post 2 units of platelets 05/23/2022, 1 unit platelet 05/24/2022.  Patient received another unit of platelets 05/25/2022.. -Patient with multiple areas of ecchymosis/bruising however denies any overt bleeding. -Anemia panel consistent with anemia of chronic disease.  Iron level of 29, ferritin of 45, folate of 39.7, vitamin B12 of 706. -Continue PPI IV twice  daily. -Patient seen by hematology/oncology who feels patient's pancytopenia with worsening thrombocytopenia secondary to liver cirrhosis and alcohol toxicity induced bone marrow suppression. -Hematology/oncology recommending transfusion of a unit of platelets daily if platelet count < 15K. -Appreciate hematology input and recommendations.  Liver mass 4.4cm (previously 4.6cm) heterogenous lesion of anterior R lobe of liver.  Biopsy in Oct 2023 was neg for [facility] (just showed primarily necrotic tissue). Doesn't look to have enlarged on todays CT scan (actually was 4.6cm previously, 4.4cm today). Also has 1.6cm hypervascular lesion of L lobe of liver.  Looks like this was 1.0cm on prior CT, but they didn't mention it at all on MRI previously. 4.4cm lesion with neg pathology, doesn't seem to be enlarging on serial imaging exams. 1.6cm hypervascular lesion of L lobe of liver of unclear significance Hematology/oncology consulted and reviewed imaging and recommended to follow for now with outpatient liver MRI in 2 to 3 months.    Cirrhosis of liver (HCC) Pt with EtOH cirrhosis of liver, unfortunately continues to drink heavily. Mild transaminitis that looks to be baseline. Doesn't seem to be on any diuretics chronically prior to admission, no lactulose, etc. -Patient started on Lasix 20 mg daily and spironolactone 25 mg daily. -Follow electrolytes. -Will need outpatient follow-up with GI postdischarge.  Alcohol abuse Pt with EtOH intoxication on presentation noted to have alcohol level of 415.   -Patient with concern for alcohol withdrawal. -Patient states drinks about 20 beers daily and has been drinking since age 47.   -  Patient states he is ready for alcohol cessation and will not drink again posthospitalization.   -Patient noted to be tachycardic, some clamminess/diaphoretic on exam on 05/25/2022, per RN when patient stands up with significant tremors noted. -Continue Librium detox protocol.    -Continue stepdown Ativan CIWA protocol, thiamine, folic acid, multivitamin.   -Patient noted with a CIWA score of 20 this morning.   -Consult with PCCM for further evaluation to see whether patient needs to be placed on the Precedex drip.  History of eye trauma Pt has h/o traumatic injury to R eye in March 2023, with chronic dilated pupil and very poor vision in that eye ever since.  Not on any eye-drops chronically.  He confirms that none of this is new or changed recently though and review of 06/01/21 ED visit note and 11/26/21 optho appointment note seem to confirm that this is a chronic issue.  DM (diabetes mellitus), type 2 (HCC) - Hemoglobin A1c 7.9  (04/03/2022 ) -CBG 171 this morning. -Continue sliding scale insulin.    Hypomagnesemia - Mag level noted at 1.6 on presentation, noted to have received magnesium in the ED.   -Status post IV magnesium.   -Magnesium of 1.8 this morning.   -Magnesium sulfate 2 g IV x 1.   -Repeat labs in the morning.  Fever -Patient noted to have intact as high as 103. -Patient pancultured, urinalysis concerning for UTI. -Blood cultures obtained pending with 1/4 with GPC in clusters. -Continue IV Rocephin and IV Vancomycin.  UTI (urinary tract infection) -Patient noted to spike a fever yesterday. -Urinalysis concerning for UTI. -Urine cultures pending. -Patient started on IV Rocephin which we will continue pending urine culture results.  Hypoxia -Patient noted to be hypoxic the evening of 05/24/2022, per RN requiring 2 L nasal cannula. -Per RN patient with some bouts of apnea noted when sleeping, however when awake sats noted to be greater than 94% on room air. -Concern for possible OSA. -Patient also noted to be receiving platelets during the hospitalization. -BNP noted at 110.3. -Chest x-ray concerning for volume overload -SARS coronavirus 2 PCR negative. -Influenza A and B PCR negative. -Respiratory viral panel negative. -Patient received  Lasix 40 mg IV yesterday x 1 with urine output of 2.2 L over the past 24 hours. -Hypoxia seems to be improving however still needs evaluation for OSA in the outpatient setting. -Give a dose of Lasix 40 mg IV x 1 today. -I's and O's, daily weights. -Will likely need outpatient evaluation for OSA.  Hyponatremia -Patient with a sodium level of 127 on 05/25/2022, was 124 on admission. -Patient does not look volume overloaded on examination however has been receiving some platelets during this hospitalization. -Patient also started on Lasix and spironolactone during this hospitalization. -Urinalysis with large leukocytes, nitrite positive, 50 of glucose, 30 protein, rare bacteria, WBC clumps present, WBC  > 20. -Urine sodium of 84, urine creatinine 188, urine osmolality 685. -Serum osmolality of 270.  -Chest x-ray done was concerning for volume overload.  -BNP noted at 110.3.  -Patient received Lasix 40 mg IV x 1 with improvement with hyponatremia sodium currently at 132.  -Will give another dose of IV Lasix today.  -Follow.  Hypophosphatemia - Phosphorus repleted currently at 3.0.   -Repeat labs in the AM.   Hematoma of right flank -Patient with large hematoma of the right flank area complaining of pain. -Secondary to fall in the setting of pancytopenia with severe thrombocytopenia. -Warm compresses. -Pain management.  Benign prostatic hyperplasia (  BPH) with straining on urination -Flomax.   Hypokalemia -Magnesium repleted currently at 1.8 this morning.   -Potassium 3.0.   -K-Dur 40 mEq p.o. every 4 hours x 2 doses.   -Repeat labs in the AM.  Alcohol withdrawal with delirium ([place]) -Patient with concern for ongoing alcohol withdrawal. -See alcohol abuse above. -Patient noted with a CIWA score of 20 this morning. -Continue Librium detox protocol, stepdown Ativan withdrawal protocol. -Will consult with PCCM to evaluate to see if patient needs to be placed on the Precedex  drip.  Hypertension Patient with elevated blood pressure likely component of alcohol abuse/withdrawal. -Continue Norvasc 10 mg daily, Lasix, spironolactone which was started for cirrhosis.  Continue home regimen Flomax..  -IV hydralazine as needed.         DVT prophylaxis: SCDs Code Status: Full Family Communication: Updated patient.  No family at bedside. Disposition: Remain in stepdown unit.  Likely home when clinically improved.  Status is: Inpatient Remains inpatient appropriate because: Severity of illness   Consultants:  Hematology/oncology: Dr. Burr Medico 05/23/2022  Procedures:  CT abdomen and pelvis 05/22/2022 Transfusion 2 units platelets 05/23/2022 Transfusion 1 unit platelet  05/24/2022 Transfuse 1 unit platelets 05/25/2022 Chest x-ray 05/25/2022  Antimicrobials:  IV Rocephin 05/25/2022>>>>>>> IV Vanc 05/26/2022   Subjective: Patient awake.  Denies any chest pain or shortness of breath.  Seems slightly confused.  Per RN noted to be hypoxic when sleeping with some bouts of apneic episodes.  Currently on room air with sats of 97%.  Patient denies any overt bleeding.  Patient feels right flank pain is slowly getting better.    Objective: Vitals:   05/26/22 0900 05/26/22 0944 05/26/22 1000 05/26/22 1045  BP:  (!) 160/103 (!) 146/91   Pulse: 97 (!) 112 (!) 112 (!) 101  Resp: 14  18   Temp:      TempSrc:      SpO2: (!) 88%  93%   Weight:      Height:        Intake/Output Summary (Last 24 hours) at 05/26/2022 1104 Last data filed at 05/26/2022 1000 Gross per 24 hour  Intake 923.51 ml  Output 2750 ml  Net -1826.49 ml   Filed Weights   05/23/22 0042 05/25/22 0749 05/26/22 0701  Weight: 70.1 kg 70.2 kg 68.2 kg    Examination:  General exam: NAD.  98% on room air. Respiratory system: Lungs with some bibasilar crackles otherwise clear.  No wheezing.  No rhonchi.  Fair air movement.  Speaking in full sentences.  Cardiovascular system: Tachycardia.  No murmurs rubs or  gallops.  No JVD.  No significant lower extremity edema.  Gastrointestinal system: Abdomen is soft, minimally distended, positive bowel sounds.  No rebound.  No guarding.  Central nervous system: Alert and oriented. No focal neurological deficits. Extremities: Symmetric 5 x 5 power. Skin: Multiple areas of ecchymosis over his skin including large area of ecchymosis in the right flank/lower back region.  Left upper extremity ecchymosis.  Left lower extremity ecchymosis.  Psychiatry: Judgement and insight appear normal. Mood & affect appropriate.     Data Reviewed:   CBC: Recent Labs  Lab 05/24/22 0249 05/24/22 1358 05/25/22 0304 05/25/22 1507 05/26/22 0322  WBC 1.8* 2.1* 1.5* 1.2* 1.6*  NEUTROABS 1.2* 1.5* 1.1* 0.7* 0.9*  HGB 9.0* 8.8* 9.7* 8.2* 10.7*  HCT 27.3* 26.9* 28.6* 24.8* 31.7*  MCV 82.5 82.5 81.7 82.9 82.1  PLT 13* 16* 11* 15* 16*    Basic Metabolic Panel: Recent Labs  Lab 05/22/22 1901 05/23/22 0348 05/24/22 0249 05/25/22 0542 05/26/22 0322  NA 124* 131* 131* 127* 132*  K 3.6 3.6 3.3* 3.5 3.0*  CL 89* 95* 91* 90* 92*  CO2 19* '24 25 27 28  '$ GLUCOSE 210* 213* 150* 176* 127*  BUN <5* 5* '7 9 6  '$ CREATININE 0.49* 0.56* 0.52* 0.73 0.57*  CALCIUM 7.9* 7.7* 8.2* 8.2* 8.2*  MG 1.6* 2.1 1.8 1.6* 1.8  PHOS  --   --  2.6 2.0* 3.0    GFR: Estimated Creatinine Clearance: 99.8 mL/min (A) (by C-G formula based on SCr of 0.57 mg/dL (L)).  Liver Function Tests: Recent Labs  Lab 05/22/22 1901 05/23/22 0348 05/24/22 0249 05/25/22 0542 05/26/22 0322  AST 153* 127* 93* 70* 79*  ALT 114* 100* 83* 68* 61*  ALKPHOS 80 66 70 67 64  BILITOT 3.1* 3.2* 3.3* 3.2* 3.2*  PROT 6.6 6.2* 6.0* 6.3* 6.1*  ALBUMIN 3.8 3.4* 3.4* 3.4* 3.3*    CBG: Recent Labs  Lab 05/25/22 0813 05/25/22 1222 05/25/22 1536 05/25/22 2149 05/26/22 0843  GLUCAP 165* 178* 124* 127* 171*     Recent Results (from the past 240 hour(s))  MRSA Next Gen by PCR, Nasal     Status: None   Collection  Time: 05/23/22 12:26 AM   Specimen: Nasal Mucosa; Nasal Swab  Result Value Ref Range Status   MRSA by PCR Next Gen NOT DETECTED NOT DETECTED Final    Comment: (NOTE) The GeneXpert MRSA Assay (FDA approved for NASAL specimens only), is one component of a comprehensive MRSA colonization surveillance program. It is not intended to diagnose MRSA infection nor to guide or monitor treatment for MRSA infections. Test performance is not FDA approved in patients less than 23 years old. Performed at [facility], [place] [address] [place], [place] [address]   Culture, blood (Routine X 2) w Reflex to ID Panel     Status: None (Preliminary result)   Collection Time: 05/25/22  2:25 PM   Specimen: BLOOD  Result Value Ref Range Status   Specimen Description   Final    BLOOD BLOOD RIGHT ARM BLOOD RIGHT HAND Performed at [facility] [address] [place], [place] [address]    Special Requests   Final    BOTTLES DRAWN AEROBIC AND ANAEROBIC Blood Culture adequate volume Performed at [facility] [address] [place], [place] [address]    Culture   Final    NO GROWTH < 24 HOURS Performed at [facility] [address] [place], [place] [address]    Report Status PENDING  Incomplete  Culture, blood (Routine X 2) w Reflex to ID Panel     Status: None (Preliminary result)   Collection Time: 05/25/22  2:26 PM   Specimen: BLOOD  Result Value Ref Range Status   Specimen Description   Final    BLOOD BLOOD LEFT ARM BLOOD LEFT HAND Performed at [facility] [address] [place], [place] [address]    Special Requests   Final    BOTTLES DRAWN AEROBIC AND ANAEROBIC Blood Culture adequate volume Performed at [facility] [address] [place], [place] [address]    Culture  Setup Time   Final    GRAM POSITIVE COCCI IN CLUSTERS ANAEROBIC BOTTLE ONLY Organism ID to follow CRITICAL RESULT  CALLED TO, READ BACK BY AND VERIFIED WITH:  C/ PHARMD J. GADHIA 05/26/22 0950 A. LAFRANCE Performed at [facility], [place] [address], [place], [place] [address]    Culture GRAM POSITIVE COCCI  Final   Report Status PENDING  Incomplete  Blood Culture ID Panel (Reflexed)     Status: Abnormal   Collection Time: 05/25/22  2:26 PM  Result Value Ref Range Status   Enterococcus faecalis NOT DETECTED NOT DETECTED Final   Enterococcus Faecium NOT DETECTED NOT DETECTED Final   Listeria monocytogenes NOT DETECTED NOT DETECTED Final   Staphylococcus species DETECTED (A) NOT DETECTED Final    Comment: CRITICAL RESULT CALLED TO, READ BACK BY AND VERIFIED WITH:  C/ PHARMD J. GADHIA 05/26/22 0950 A. LAFRANCE    Staphylococcus aureus (BCID) DETECTED (A) NOT DETECTED Final    Comment: CRITICAL RESULT CALLED TO, READ BACK BY AND VERIFIED WITH:  C/ PHARMD J. GADHIA 05/26/22 0950 A. LAFRANCE    Staphylococcus epidermidis NOT DETECTED NOT DETECTED Final   Staphylococcus lugdunensis NOT DETECTED NOT DETECTED Final   Streptococcus species NOT DETECTED NOT DETECTED Final   Streptococcus agalactiae NOT DETECTED NOT DETECTED Final   Streptococcus pneumoniae NOT DETECTED NOT DETECTED Final   Streptococcus pyogenes NOT DETECTED NOT DETECTED Final   A.calcoaceticus-baumannii NOT DETECTED NOT DETECTED Final   Bacteroides fragilis NOT DETECTED NOT DETECTED Final   Enterobacterales NOT DETECTED NOT DETECTED Final   Enterobacter cloacae complex NOT DETECTED NOT DETECTED Final   Escherichia coli NOT DETECTED NOT DETECTED Final   Klebsiella aerogenes NOT DETECTED NOT DETECTED Final   Klebsiella oxytoca NOT DETECTED NOT DETECTED Final   Klebsiella pneumoniae NOT DETECTED NOT DETECTED Final   Proteus species NOT DETECTED NOT DETECTED Final   Salmonella species NOT DETECTED NOT DETECTED Final   Serratia marcescens NOT DETECTED NOT DETECTED Final   Haemophilus influenzae NOT DETECTED NOT DETECTED Final    Neisseria meningitidis NOT DETECTED NOT DETECTED Final   Pseudomonas aeruginosa NOT DETECTED NOT DETECTED Final   Stenotrophomonas maltophilia NOT DETECTED NOT DETECTED Final   Candida albicans NOT DETECTED NOT DETECTED Final   Candida auris NOT DETECTED NOT DETECTED Final   Candida glabrata NOT DETECTED NOT DETECTED Final   Candida krusei NOT DETECTED NOT DETECTED Final   Candida parapsilosis NOT DETECTED NOT DETECTED Final   Candida tropicalis NOT DETECTED NOT DETECTED Final   Cryptococcus neoformans/gattii NOT DETECTED NOT DETECTED Final   Meth resistant mecA/C and MREJ NOT DETECTED NOT DETECTED Final    Comment: Performed at [facility], [address], [place], [place] [address]  Resp panel by RT-PCR (RSV, Flu A&B, Covid) Anterior Nasal Swab     Status: None   Collection Time: 05/25/22  8:17 PM   Specimen: Anterior Nasal Swab  Result Value Ref Range Status   SARS Coronavirus 2 by RT PCR NEGATIVE NEGATIVE Final    Comment: (NOTE) SARS-CoV-2 target nucleic acids are NOT DETECTED.  The SARS-CoV-2 RNA is generally detectable in upper respiratory specimens during the acute phase of infection. The lowest concentration of SARS-CoV-2 viral copies this assay can detect is 138 copies/mL. A negative result does not preclude SARS-Cov-2 infection and should not be used as the sole basis for treatment or other patient management decisions. A negative result may occur with  improper specimen collection/handling, submission of specimen other than nasopharyngeal swab, presence of viral mutation(s) within the areas targeted by this assay, and inadequate number of viral copies(<138 copies/mL). A negative result must be combined with clinical observations, patient history, and epidemiological information. The expected result is Negative.  Fact Sheet  for Patients:  EntrepreneurPulse.com.au  Fact Sheet for Healthcare Providers:   IncredibleEmployment.be  This test is no t yet approved or cleared by the [place] FDA and  has been authorized for detection and/or diagnosis of SARS-CoV-2 by FDA under an Emergency Use Authorization (EUA). This EUA will remain  in effect (meaning this test can be used) for the duration of the COVID-19 declaration under Section 564(b)(1) of the Act, 21 U.S.C.section 360bbb-3(b)(1), unless the authorization is terminated  or revoked sooner.       Influenza A by PCR NEGATIVE NEGATIVE Final   Influenza B by PCR NEGATIVE NEGATIVE Final    Comment: (NOTE) The Xpert Xpress SARS-CoV-2/FLU/RSV plus assay is intended as an aid in the diagnosis of influenza from Nasopharyngeal swab specimens and should not be used as a sole basis for treatment. Nasal washings and aspirates are unacceptable for Xpert Xpress SARS-CoV-2/FLU/RSV testing.  Fact Sheet for Patients: EntrepreneurPulse.com.au  Fact Sheet for Healthcare Providers: IncredibleEmployment.be  This test is not yet approved or cleared by the [place] FDA and has been authorized for detection and/or diagnosis of SARS-CoV-2 by FDA under an Emergency Use Authorization (EUA). This EUA will remain in effect (meaning this test can be used) for the duration of the COVID-19 declaration under Section 564(b)(1) of the Act, 21 U.S.C. section 360bbb-3(b)(1), unless the authorization is terminated or revoked.     Resp Syncytial Virus by PCR NEGATIVE NEGATIVE Final    Comment: (NOTE) Fact Sheet for Patients: EntrepreneurPulse.com.au  Fact Sheet for Healthcare Providers: IncredibleEmployment.be  This test is not yet approved or cleared by the [place] FDA and has been authorized for detection and/or diagnosis of SARS-CoV-2 by FDA under an Emergency Use Authorization (EUA). This EUA will remain in effect (meaning this test can be used) for  the duration of the COVID-19 declaration under Section 564(b)(1) of the Act, 21 U.S.C. section 360bbb-3(b)(1), unless the authorization is terminated or revoked.  Performed at [facility], [place] [address] [place], [place] [address]          Radiology Studies: DG CHEST PORT 1 VIEW  Result Date: 05/25/2022 CLINICAL DATA:  Hypoxia EXAM: PORTABLE CHEST 1 VIEW COMPARISON:  X-ray 05/22/2022 FINDINGS: Underinflation with enlarged cardiopericardial silhouette and new vascular congestion. No pneumothorax or effusion. No consolidation. Overlapping cardiac leads. Film is rotated. IMPRESSION: Enlarged cardiopericardial silhouette with new vascular congestion. Underinflated x-ray Electronically Signed   By: Jill Side M.D.   On: 05/25/2022 14:06        Scheduled Meds:  amLODipine  10 mg Oral Daily   chlordiazePOXIDE  25 mg Oral BH-qamhs   Followed by   Derrill Memo ON 05/27/2022] chlordiazePOXIDE  25 mg Oral Daily   Chlorhexidine Gluconate Cloth  6 each Topical Daily   diclofenac Sodium  2 g Topical QID   folic acid  1 mg Oral Daily   furosemide  20 mg Oral Daily   insulin aspart  0-15 Units Subcutaneous TID WC   insulin aspart  0-5 Units Subcutaneous QHS   lactulose  30 g Oral Daily   mirtazapine  15 mg Oral QHS   multivitamin with minerals  1 tablet Oral Daily   pantoprazole (PROTONIX) IV  40 mg Intravenous Q12H   potassium chloride  40 mEq Oral Q4H   spironolactone  25 mg Oral Daily   tamsulosin  0.4 mg Oral QPC supper   thiamine  100 mg Oral Daily   Or   thiamine  100 mg Intravenous  Daily   Continuous Infusions:  cefTRIAXone (ROCEPHIN)  IV Stopped (05/26/22 1056)   vancomycin     vancomycin       LOS: 3 days    Time spent: 40 minutes    Irine Seal, MD [facility]   To contact the attending provider between 7A-7P or the covering provider during after hours 7P-7A, please log into the web site [email or web address] and access using universal [facility] password for that web site. If you do not have the password, please call the hospital operator.  05/26/2022, 11:04 AM

## 2022-05-26 NOTE — Consult Note (Addendum)
NAME:  [name], MRN:  [id number], DOB:  May 20, 1970, LOS: 3 ADMISSION DATE:  05/22/2022, CONSULTATION DATE:  05/26/22 REFERRING MD:  Grandville Silos, CHIEF COMPLAINT:  agitation   History of Present Illness:  51yM with history of etoh use, etoh cirrhosis, pancytopenia, HTN, DM2 who [facility] by friend with concern for heavy etoh use, recurrent falls. He expressed interest in cessation and had been prescribed Librium taper in past which he had found helpful. He was found to have large bruise over right flank. Hematology involved early in admission for TCP which was felt likely due to etoh bone marrow toxicity/splenomegaly. He has received 4U PLT total here to date, last given 3/3.  His course here has been complicated by fever, thought to have UTI and started on ceftriaxone 3/3. BCx 3/3 with BCID panel positive for staph aureus this morning.   We were consulted today for recurrent agitation despite librium taper and a lot of CIWA based ativan.    Pertinent  Medical History  Etoh use Etoh cirrhosis Pancytopenia HTN DM2  Significant Hospital Events: Including procedures, antibiotic start and stop dates in addition to other pertinent events   05/22/22 admitted for hematoma, severe thrombocytopenia and moderate etoh withdrawal 3/3 fever, BCx, ceftriaxone for presumed UTI 3/4 PCCM consult for agitation despite librium taper, CIWA based ativan  Interim History / Subjective:    Objective   Blood pressure (!) 146/91, pulse (!) 112, temperature 98.8 F (37.1 C), temperature source Axillary, resp. rate 18, height '5\' 7"'$  (1.702 m), weight 70.2 kg, SpO2 93 %.        Intake/Output Summary (Last 24 hours) at 05/26/2022 1020 Last data filed at 05/26/2022 1000 Gross per 24 hour  Intake 923.51 ml  Output 2750 ml  Net -1826.49 ml   Filed Weights   05/22/22 1833 05/23/22 0042 05/25/22 0749  Weight: 72.6 kg 70.1 kg 70.2 kg    Examination: General appearance: 52 y.o.,  male, chronically ill appearing Eyes: R pupil dilated, tracking appropriately HENT: NCAT; dry MM Neck: Trachea midline; no lymphadenopathy, no JVD Lungs: CTAB, no crackles, no wheeze, with normal respiratory effort CV: RRR, no murmur  Abdomen: Soft, non-tender; non-distended, BS present  Extremities: No peripheral edema, warm Skin: Normal turgor and texture; no rash Psych: Appropriate affect Neuro: Alert and oriented to person and place, no focal deficit    Resolved Hospital Problem list    Assessment & Plan:   Fever Sepsis due to UTI, staph aureus bacteremia. Fever could be due to UTI, staph bacteremia or PLT transfusions. - vanc, ceftriaxone follow cultures and narrow as able  Acute toxic, metabolic encephalopathy Moderate to severe alcohol withdrawal Could still be etoh withdrawal but fairly late in course, alternatively TME due to sepsis - check ammonia, tsh - exam is nonfocal, anisocoria is old, ok to hold off on [facility] now - ABX as above - thiamine - looks ok to me now, for now I have extended his librium taper a bit and if still needs frequent ativan dosing, ok to add precedex for agitation   DM2 - SSI   Hypoxia Has been responsive to lasix after PLT transfusions, concern however about underlying OSA.  - CTM  Pancytopenia R flank hematoma - transfuse for PLT 15, 50 if active bleeding  Best Practice (right click and "Reselect all SmartList Selections" daily)   Per [facility]  Labs   CBC: Recent Labs  Lab 05/24/22 0249 05/24/22 1358 05/25/22 0304 05/25/22 1507 05/26/22 [id number]  WBC 1.8* 2.1* 1.5* 1.2* 1.6*  NEUTROABS 1.2* 1.5* 1.1* 0.7* 0.9*  HGB 9.0* 8.8* 9.7* 8.2* 10.7*  HCT 27.3* 26.9* 28.6* 24.8* 31.7*  MCV 82.5 82.5 81.7 82.9 82.1  PLT 13* 16* 11* 15* 16*    Basic Metabolic Panel: Recent Labs  Lab 05/22/22 1901 05/23/22 0348 05/24/22 0249 05/25/22 0542 05/26/22 0322  NA 124* 131* 131* 127* 132*  K 3.6 3.6 3.3* 3.5 3.0*  CL 89* 95* 91* 90* 92*  CO2  19* '24 25 27 28  '$ GLUCOSE 210* 213* 150* 176* 127*  BUN <5* 5* '7 9 6  '$ CREATININE 0.49* 0.56* 0.52* 0.73 0.57*  CALCIUM 7.9* 7.7* 8.2* 8.2* 8.2*  MG 1.6* 2.1 1.8 1.6* 1.8  PHOS  --   --  2.6 2.0* 3.0   GFR: Estimated Creatinine Clearance: 99.8 mL/min (A) (by C-G formula based on SCr of 0.57 mg/dL (L)). Recent Labs  Lab 05/24/22 1358 05/25/22 0304 05/25/22 1507 05/26/22 0322  WBC 2.1* 1.5* 1.2* 1.6*    Liver Function Tests: Recent Labs  Lab 05/22/22 1901 05/23/22 0348 05/24/22 0249 05/25/22 0542 05/26/22 0322  AST 153* 127* 93* 70* 79*  ALT 114* 100* 83* 68* 61*  ALKPHOS 80 66 70 67 64  BILITOT 3.1* 3.2* 3.3* 3.2* 3.2*  PROT 6.6 6.2* 6.0* 6.3* 6.1*  ALBUMIN 3.8 3.4* 3.4* 3.4* 3.3*   Recent Labs  Lab 05/22/22 1901  LIPASE 76*   No results for input(s): "AMMONIA" in the last 168 hours.  ABG No results found for: "PHART", "PCO2ART", "PO2ART", "HCO3", "TCO2", "ACIDBASEDEF", "O2SAT"   Coagulation Profile: Recent Labs  Lab 05/22/22 1901  INR 1.3*    Cardiac Enzymes: No results for input(s): "CKTOTAL", "CKMB", "CKMBINDEX", "TROPONINI" in the last 168 hours.  HbA1C: Hgb A1c MFr Bld  Date/Time Value Ref Range Status  04/03/2022 11:17 AM 7.9 (H) 4.8 - 5.6 % Final    Comment:             Prediabetes: 5.7 - 6.4          Diabetes: >6.4          Glycemic control for adults with diabetes: <7.0   12/24/2021 08:31 PM 6.9 (H) 4.8 - 5.6 % Final    Comment:    (NOTE) Pre diabetes:          5.7%-6.4%  Diabetes:              >6.4%  Glycemic control for   <7.0% adults with diabetes     CBG: Recent Labs  Lab 05/25/22 0813 05/25/22 1222 05/25/22 1536 05/25/22 2149 05/26/22 0843  GLUCAP 165* 178* 124* 127* 171*    Review of Systems:   Unable to obtain in setting of his acute encephalopathy  Past Medical History:  He,  has a past medical history of Alcoholic ([place]), Anxiety, Diabetes ([place]), DVT (deep venous thrombosis) ([place]), and Hypertension.   Surgical  History:   Past Surgical History:  Procedure Laterality Date   BLOOD PATCH     ESOPHAGOGASTRODUODENOSCOPY N/A 12/26/2021   Procedure: ESOPHAGOGASTRODUODENOSCOPY (EGD);  Surgeon: Ronnette Juniper, MD;  Location: [facility];  Service: Gastroenterology;  Laterality: N/A;   TONSILLECTOMY       Social History:   reports that he has never smoked. He has never used smokeless tobacco. He reports current alcohol use of about 100.0 standard drinks of alcohol per week. He reports that he does not use drugs.   Family History:  His family history includes Hypertension  in his father.   Allergies No Known Allergies   Home Medications  Prior to Admission medications   Medication Sig Start Date End Date Taking? Authorizing Provider  folic acid (FOLVITE) 1 MG tablet TAKE 1 TABLET BY MOUTH EVERY DAY 05/05/22  Yes [name], MD  metFORMIN (GLUCOPHAGE-XR) 500 MG 24 hr tablet Take 500 mg by mouth 2 (two) times daily with a meal. 05/14/22  Yes [provider]  mirtazapine (REMERON) 15 MG tablet Take 1 tablet (15 mg total) by mouth at bedtime. 04/07/22  Yes [name], MD  pantoprazole (PROTONIX) 40 MG tablet Take 1 tablet (40 mg total) by mouth 2 (two) times daily before a meal. 12/28/21 05/23/22 Yes Dahal, Marlowe Aschoff, MD  tamsulosin (FLOMAX) 0.4 MG CAPS capsule Take 1 capsule (0.4 mg total) by mouth daily after supper. 04/03/22  Yes [name], MD  thiamine (VITAMIN B1) 100 MG tablet Take 100 mg by mouth daily. 05/05/22  Yes [provider]  traZODone (DESYREL) 150 MG tablet Take 150 mg by mouth at bedtime. 04/07/22  Yes [provider]  fexofenadine-pseudoephedrine (ALLEGRA-D) 60-120 MG 12 hr tablet Take 1 tablet by mouth 2 (two) times daily. Patient not taking: Reported on 05/23/2022 04/03/22 04/03/23  [name], MD     Critical care time: na

## 2022-05-26 NOTE — Assessment & Plan Note (Addendum)
-  Patient noted to have intact as high as 103. -Patient pancultured, urinalysis concerning for UTI. -Blood cultures obtained pending with 2/4 cultures with MSSA. - IV rocephin narrowed to IV ancef per ID.

## 2022-05-26 NOTE — Progress Notes (Signed)
[name]   DOB:12/07/1970   [id number]   [id number]  Hem/onc follow up   Subjective: Patient had fever yesterday, he is on alcohol withdrawal protocol, has been receiving Ativan.  He was very drowsy when I saw him, no new active bleeding per his nurse.  Objective:  Vitals:   05/26/22 1110 05/26/22 1200  BP:  (!) 146/77  Pulse: (!) 102 (!) 114  Resp: 18 20  Temp:    SpO2: 95% (!) 87%    Body mass index is 23.55 kg/m.  Intake/Output Summary (Last 24 hours) at 05/26/2022 1236 Last data filed at 05/26/2022 1000 Gross per 24 hour  Intake 543.51 ml  Output 2650 ml  Net -2106.49 ml     Sclerae unicteric  Oropharynx clear  No peripheral adenopathy  Lungs clear -- no rales or rhonchi  Heart regular rate and rhythm  Abdomen benign  MSK no focal spinal tenderness, no peripheral edema, (+) extensive ecchymosis in the right frank   Neuro nonfocal   CBG (last 3)  Recent Labs    05/25/22 1536 05/25/22 2149 05/26/22 0843  GLUCAP 124* 127* 171*     Labs:  Urine Studies No results for input(s): "UHGB", "CRYS" in the last 72 hours.  Invalid input(s): "UACOL", "UAPR", "USPG", "UPH", "UTP", "UGL", "UKET", "UBIL", "UNIT", "UROB", "ULEU", "UEPI", "UWBC", "URBC", "UBAC", "CAST", "UCOM", "BILUA"  Basic Metabolic Panel: Recent Labs  Lab 05/22/22 1901 05/23/22 0348 05/24/22 0249 05/25/22 0542 05/26/22 0322  NA 124* 131* 131* 127* 132*  K 3.6 3.6 3.3* 3.5 3.0*  CL 89* 95* 91* 90* 92*  CO2 19* '24 25 27 28  '$ GLUCOSE 210* 213* 150* 176* 127*  BUN <5* 5* '7 9 6  '$ CREATININE 0.49* 0.56* 0.52* 0.73 0.57*  CALCIUM 7.9* 7.7* 8.2* 8.2* 8.2*  MG 1.6* 2.1 1.8 1.6* 1.8  PHOS  --   --  2.6 2.0* 3.0   GFR Estimated Creatinine Clearance: 99.8 mL/min (A) (by C-G formula based on SCr of 0.57 mg/dL (L)). Liver Function Tests: Recent Labs  Lab 05/22/22 1901 05/23/22 0348 05/24/22 0249 05/25/22 0542 05/26/22 0322  AST 153* 127* 93* 70* 79*  ALT 114* 100* 83* 68* 61*   ALKPHOS 80 66 70 67 64  BILITOT 3.1* 3.2* 3.3* 3.2* 3.2*  PROT 6.6 6.2* 6.0* 6.3* 6.1*  ALBUMIN 3.8 3.4* 3.4* 3.4* 3.3*   Recent Labs  Lab 05/22/22 1901  LIPASE 76*   Recent Labs  Lab 05/26/22 1125  AMMONIA 31   Coagulation profile Recent Labs  Lab 05/22/22 1901  INR 1.3*    CBC: Recent Labs  Lab 05/24/22 0249 05/24/22 1358 05/25/22 0304 05/25/22 1507 05/26/22 0322  WBC 1.8* 2.1* 1.5* 1.2* 1.6*  NEUTROABS 1.2* 1.5* 1.1* 0.7* 0.9*  HGB 9.0* 8.8* 9.7* 8.2* 10.7*  HCT 27.3* 26.9* 28.6* 24.8* 31.7*  MCV 82.5 82.5 81.7 82.9 82.1  PLT 13* 16* 11* 15* 16*   Cardiac Enzymes: No results for input(s): "CKTOTAL", "CKMB", "CKMBINDEX", "TROPONINI" in the last 168 hours. BNP: Invalid input(s): "POCBNP" CBG: Recent Labs  Lab 05/25/22 0813 05/25/22 1222 05/25/22 1536 05/25/22 2149 05/26/22 0843  GLUCAP 165* 178* 124* 127* 171*   D-Dimer No results for input(s): "DDIMER" in the last 72 hours. Hgb A1c No results for input(s): "HGBA1C" in the last 72 hours. Lipid Profile No results for input(s): "CHOL", "HDL", "LDLCALC", "TRIG", "CHOLHDL", "LDLDIRECT" in the last 72 hours. Thyroid function studies Recent Labs    05/26/22 1125  TSH 1.466  Anemia work up No results for input(s): "VITAMINB12", "FOLATE", "FERRITIN", "TIBC", "IRON", "RETICCTPCT" in the last 72 hours.  Microbiology Recent Results (from the past 240 hour(s))  MRSA Next Gen by PCR, Nasal     Status: None   Collection Time: 05/23/22 12:26 AM   Specimen: Nasal Mucosa; Nasal Swab  Result Value Ref Range Status   MRSA by PCR Next Gen NOT DETECTED NOT DETECTED Final    Comment: (NOTE) The GeneXpert MRSA Assay (FDA approved for NASAL specimens only), is one component of a comprehensive MRSA colonization surveillance program. It is not intended to diagnose MRSA infection nor to guide or monitor treatment for MRSA infections. Test performance is not FDA approved in patients less than 29  years old. Performed at [facility], [place] [address] [place], [place] [address]   Urine Culture (for pregnant, neutropenic or urologic patients or patients with an indwelling urinary catheter)     Status: Abnormal (Preliminary result)   Collection Time: 05/25/22 12:48 PM   Specimen: Urine, Clean Catch  Result Value Ref Range Status   Specimen Description   Final    URINE, CLEAN CATCH Performed at [facility], [place] [address], [place], [place] [address]    Special Requests   Final    NONE Performed at [facility], [place] [address] [place], [place] [address]    Culture (A)  Final    >=100,000 COLONIES/mL STAPHYLOCOCCUS AUREUS SUSCEPTIBILITIES TO FOLLOW Performed at [facility], [place] [address], [place], [place] [address]    Report Status PENDING  Incomplete  Culture, blood (Routine X 2) w Reflex to ID Panel     Status: None (Preliminary result)   Collection Time: 05/25/22  2:25 PM   Specimen: BLOOD  Result Value Ref Range Status   Specimen Description   Final    BLOOD BLOOD RIGHT ARM BLOOD RIGHT HAND Performed at [place] [address] [place], [place] [address]    Special Requests   Final    BOTTLES DRAWN AEROBIC AND ANAEROBIC Blood Culture adequate volume Performed at [place] [address] [place], [place] [address]    Culture   Final    NO GROWTH < 24 HOURS Performed at [place] [address], [place], [place] [address]    Report Status PENDING  Incomplete  Culture, blood (Routine X 2) w Reflex to ID Panel     Status: None (Preliminary result)   Collection Time: 05/25/22  2:26 PM   Specimen: BLOOD  Result Value Ref Range Status   Specimen Description   Final    BLOOD BLOOD LEFT ARM BLOOD LEFT HAND Performed at [place] [address], [place], [place] [address]    Special Requests   Final    BOTTLES DRAWN AEROBIC AND  ANAEROBIC Blood Culture adequate volume Performed at [place] [address] [place], [place] [address]    Culture  Setup Time   Final    GRAM POSITIVE COCCI IN CLUSTERS ANAEROBIC BOTTLE ONLY Organism ID to follow CRITICAL RESULT CALLED TO, READ BACK BY AND VERIFIED WITH:  C/ PHARMD J. GADHIA 05/26/22 0950 A. LAFRANCE Performed at [facility], [place] [address], [place], [place] [address]    Culture [facility] POSITIVE COCCI  Final   Report Status PENDING  Incomplete  Blood Culture ID Panel (Reflexed)     Status: Abnormal   Collection Time:  05/25/22  2:26 PM  Result Value Ref Range Status   Enterococcus faecalis NOT DETECTED NOT DETECTED Final   Enterococcus Faecium NOT DETECTED NOT DETECTED Final   Listeria monocytogenes NOT DETECTED NOT DETECTED Final   Staphylococcus species DETECTED (A) NOT DETECTED Final    Comment: CRITICAL RESULT CALLED TO, READ BACK BY AND VERIFIED WITH:  C/ PHARMD J. GADHIA 05/26/22 0950 A. LAFRANCE    Staphylococcus aureus (BCID) DETECTED (A) NOT DETECTED Final    Comment: CRITICAL RESULT CALLED TO, READ BACK BY AND VERIFIED WITH:  C/ PHARMD J. GADHIA 05/26/22 0950 A. LAFRANCE    Staphylococcus epidermidis NOT DETECTED NOT DETECTED Final   Staphylococcus lugdunensis NOT DETECTED NOT DETECTED Final   Streptococcus species NOT DETECTED NOT DETECTED Final   Streptococcus agalactiae NOT DETECTED NOT DETECTED Final   Streptococcus pneumoniae NOT DETECTED NOT DETECTED Final   Streptococcus pyogenes NOT DETECTED NOT DETECTED Final   A.calcoaceticus-baumannii NOT DETECTED NOT DETECTED Final   Bacteroides fragilis NOT DETECTED NOT DETECTED Final   Enterobacterales NOT DETECTED NOT DETECTED Final   Enterobacter cloacae complex NOT DETECTED NOT DETECTED Final   Escherichia coli NOT DETECTED NOT DETECTED Final   Klebsiella aerogenes NOT DETECTED NOT DETECTED Final   Klebsiella oxytoca NOT DETECTED NOT DETECTED Final   Klebsiella pneumoniae  NOT DETECTED NOT DETECTED Final   Proteus species NOT DETECTED NOT DETECTED Final   Salmonella species NOT DETECTED NOT DETECTED Final   Serratia marcescens NOT DETECTED NOT DETECTED Final   Haemophilus influenzae NOT DETECTED NOT DETECTED Final   Neisseria meningitidis NOT DETECTED NOT DETECTED Final   Pseudomonas aeruginosa NOT DETECTED NOT DETECTED Final   Stenotrophomonas maltophilia NOT DETECTED NOT DETECTED Final   Candida albicans NOT DETECTED NOT DETECTED Final   Candida auris NOT DETECTED NOT DETECTED Final   Candida glabrata NOT DETECTED NOT DETECTED Final   Candida krusei NOT DETECTED NOT DETECTED Final   Candida parapsilosis NOT DETECTED NOT DETECTED Final   Candida tropicalis NOT DETECTED NOT DETECTED Final   Cryptococcus neoformans/gattii NOT DETECTED NOT DETECTED Final   Meth resistant mecA/C and MREJ NOT DETECTED NOT DETECTED Final    Comment: Performed at [facility], [address], [place], [place] [address]  Resp panel by RT-PCR (RSV, Flu A&B, Covid) Anterior Nasal Swab     Status: None   Collection Time: 05/25/22  8:17 PM   Specimen: Anterior Nasal Swab  Result Value Ref Range Status   SARS Coronavirus 2 by RT PCR NEGATIVE NEGATIVE Final    Comment: (NOTE) SARS-CoV-2 target nucleic acids are NOT DETECTED.  The SARS-CoV-2 RNA is generally detectable in upper respiratory specimens during the acute phase of infection. The lowest concentration of SARS-CoV-2 viral copies this assay can detect is 138 copies/mL. A negative result does not preclude SARS-Cov-2 infection and should not be used as the sole basis for treatment or other patient management decisions. A negative result may occur with  improper specimen collection/handling, submission of specimen other than nasopharyngeal swab, presence of viral mutation(s) within the areas targeted by this assay, and inadequate number of viral copies(<138 copies/mL). A negative result must be combined with clinical  observations, patient history, and epidemiological information. The expected result is Negative.  Fact Sheet for Patients:  EntrepreneurPulse.com.au  Fact Sheet for Healthcare Providers:  IncredibleEmployment.be  This test is no t yet approved or cleared by the [place] FDA and  has been authorized for detection and/or diagnosis of SARS-CoV-2 by FDA under  an Emergency Use Authorization (EUA). This EUA will remain  in effect (meaning this test can be used) for the duration of the COVID-19 declaration under Section 564(b)(1) of the Act, 21 U.S.C.section 360bbb-3(b)(1), unless the authorization is terminated  or revoked sooner.       Influenza A by PCR NEGATIVE NEGATIVE Final   Influenza B by PCR NEGATIVE NEGATIVE Final    Comment: (NOTE) The Xpert Xpress SARS-CoV-2/FLU/RSV plus assay is intended as an aid in the diagnosis of influenza from Nasopharyngeal swab specimens and should not be used as a sole basis for treatment. Nasal washings and aspirates are unacceptable for Xpert Xpress SARS-CoV-2/FLU/RSV testing.  Fact Sheet for Patients: EntrepreneurPulse.com.au  Fact Sheet for Healthcare Providers: IncredibleEmployment.be  This test is not yet approved or cleared by the [place] FDA and has been authorized for detection and/or diagnosis of SARS-CoV-2 by FDA under an Emergency Use Authorization (EUA). This EUA will remain in effect (meaning this test can be used) for the duration of the COVID-19 declaration under Section 564(b)(1) of the Act, 21 U.S.C. section 360bbb-3(b)(1), unless the authorization is terminated or revoked.     Resp Syncytial Virus by PCR NEGATIVE NEGATIVE Final    Comment: (NOTE) Fact Sheet for Patients: EntrepreneurPulse.com.au  Fact Sheet for Healthcare Providers: IncredibleEmployment.be  This test is not yet approved or cleared by  the [place] FDA and has been authorized for detection and/or diagnosis of SARS-CoV-2 by FDA under an Emergency Use Authorization (EUA). This EUA will remain in effect (meaning this test can be used) for the duration of the COVID-19 declaration under Section 564(b)(1) of the Act, 21 U.S.C. section 360bbb-3(b)(1), unless the authorization is terminated or revoked.  Performed at [facility], [place] [address] [place], [place] [address]       Studies:  DG CHEST PORT 1 VIEW  Result Date: 05/25/2022 CLINICAL DATA:  Hypoxia EXAM: PORTABLE CHEST 1 VIEW COMPARISON:  X-ray 05/22/2022 FINDINGS: Underinflation with enlarged cardiopericardial silhouette and new vascular congestion. No pneumothorax or effusion. No consolidation. Overlapping cardiac leads. Film is rotated. IMPRESSION: Enlarged cardiopericardial silhouette with new vascular congestion. Underinflated x-ray Electronically Signed   By: Jill Side M.D.   On: 05/25/2022 14:06    Assessment: 52 y.o. male   Pancytopenia, with worsening thrombocytopenia, secondary to liver cirrhosis and alcohol toxicity Liver cirrhosis  Liver mass  Alcohol abuse  DM, HTN Fever, (+) Staphylococcus bacteremia 05/25/2022   Plan:  -Persistent severe thrombocytopenia, he has received a total of 5 units platelet, last 1 yesterday. Plt in 10-20K range, no active bleeding -will continue monitoring, plt transfusion if plt<20K due to his recent bacteremia. He is afebrile now, if repeated blood culture negative, okay to transfuse platelet only if less than 15 K -continue antibiotics and supportive care per primary team    Truitt Merle, MD 05/26/2022  12:36 PM

## 2022-05-26 NOTE — Progress Notes (Signed)
PT Cancellation Note  Patient Details Name: [name] MRN: [id number] DOB: 1971/01/30   Cancelled Treatment:    Reason Eval/Treat Not Completed: Medical issues which prohibited therapy. RN asking therapy to hold for now. Will check back as schedule allows.    [facility]  Office: [phone]

## 2022-05-26 NOTE — Assessment & Plan Note (Signed)
CIWA low - Continue thiamine and folate - Continue Librium taper

## 2022-05-26 NOTE — Progress Notes (Signed)
Pharmacy Antibiotic Note  [name] is a 52 y.o. male admitted on 05/22/2022 with bacteremia.  Pharmacy has been consulted for Vancomycin dosing.  Plan: Continue Ceftriaxone per MD Vancomycin 1500 mg IV x1 then 1g IV q12h  (SCr rounded 0.8, eAUC 453) Measure Vanc levels as needed.  Goal AUC = 400 - 550 Follow up renal function, culture results, and clinical course.    Height: '5\' 7"'$  (170.2 cm) Weight: 70.2 kg (154 lb 12.2 oz) IBW/kg (Calculated) : 66.1  Temp (24hrs), Avg:100.7 F (38.2 C), Min:98.7 F (37.1 C), Max:103 F (39.4 C)  Recent Labs  Lab 05/22/22 1901 05/23/22 0348 05/23/22 1200 05/24/22 0249 05/24/22 1358 05/25/22 0304 05/25/22 0542 05/25/22 1507 05/26/22 0322  WBC 6.0 4.0   < > 1.8* 2.1* 1.5*  --  1.2* 1.6*  CREATININE 0.49* 0.56*  --  0.52*  --   --  0.73  --  0.57*   < > = values in this interval not displayed.    Estimated Creatinine Clearance: 99.8 mL/min (A) (by C-G formula based on SCr of 0.57 mg/dL (L)).    No Known Allergies  Antimicrobials this admission: 3/3 Ceftriaxone >>  3/4 Vancomycin >>   Dose adjustments this admission:   Microbiology results: 3/1 MRSA PCR: not detected 3/3 UCx: 3/3 Resp panel: neg Covid/RSV/Flu 3/3 BCx: 1/4 bottles GPC clusters 3/4 BCID: staph aureus (methicillin resistance not detected)  Thank you for allowing pharmacy to be a part of this patient's care.  Gretta Arab PharmD, BCPS [facility] main pharmacy [phone] 05/26/2022 10:45 AM

## 2022-05-27 ENCOUNTER — Inpatient Hospital Stay (HOSPITAL_COMMUNITY): Payer: 59

## 2022-05-27 DIAGNOSIS — R7881 Bacteremia: Secondary | ICD-10-CM

## 2022-05-27 DIAGNOSIS — E871 Hypo-osmolality and hyponatremia: Secondary | ICD-10-CM | POA: Diagnosis not present

## 2022-05-27 DIAGNOSIS — S301XXA Contusion of abdominal wall, initial encounter: Secondary | ICD-10-CM | POA: Diagnosis not present

## 2022-05-27 DIAGNOSIS — B9561 Methicillin susceptible Staphylococcus aureus infection as the cause of diseases classified elsewhere: Secondary | ICD-10-CM

## 2022-05-27 DIAGNOSIS — D61818 Other pancytopenia: Secondary | ICD-10-CM | POA: Diagnosis not present

## 2022-05-27 LAB — GLUCOSE, CAPILLARY
Glucose-Capillary: 139 mg/dL — ABNORMAL HIGH (ref 70–99)
Glucose-Capillary: 192 mg/dL — ABNORMAL HIGH (ref 70–99)
Glucose-Capillary: 160 mg/dL — ABNORMAL HIGH (ref 70–99)
Glucose-Capillary: 151 mg/dL — ABNORMAL HIGH (ref 70–99)
Glucose-Capillary: 185 mg/dL — ABNORMAL HIGH (ref 70–99)
Glucose-Capillary: 181 mg/dL — ABNORMAL HIGH (ref 70–99)

## 2022-05-27 LAB — ECHOCARDIOGRAM COMPLETE
AR max vel: 1.79 cm2
AV Area VTI: 1.73 cm2
AV Area mean vel: 1.7 cm2
AV Mean grad: 8 mmHg
AV Peak grad: 15.1 mmHg
Ao pk vel: 1.95 m/s
Area-P 1/2: 5.09 cm2
Calc EF: 65.5 %
Height: 67 in
S' Lateral: 3.2 cm
Single Plane A2C EF: 64.8 %
Single Plane A4C EF: 68.8 %
Weight: 2359.8 oz

## 2022-05-27 LAB — CBC WITH DIFFERENTIAL/PLATELET
Abs Immature Granulocytes: 0.02 10*3/uL (ref 0.00–0.07)
Basophils Absolute: 0 10*3/uL (ref 0.0–0.1)
Basophils Relative: 1 %
Eosinophils Absolute: 0 10*3/uL (ref 0.0–0.5)
Eosinophils Relative: 1 %
HCT: 32.1 % — ABNORMAL LOW (ref 39.0–52.0)
Hemoglobin: 10.5 g/dL — ABNORMAL LOW (ref 13.0–17.0)
Immature Granulocytes: 1 %
Lymphocytes Relative: 18 %
Lymphs Abs: 0.4 10*3/uL — ABNORMAL LOW (ref 0.7–4.0)
MCH: 27.1 pg (ref 26.0–34.0)
MCHC: 32.7 g/dL (ref 30.0–36.0)
MCV: 82.9 fL (ref 80.0–100.0)
Monocytes Absolute: 0.8 10*3/uL (ref 0.1–1.0)
Monocytes Relative: 36 %
Neutro Abs: 0.9 10*3/uL — ABNORMAL LOW (ref 1.7–7.7)
Neutrophils Relative %: 43 %
Platelets: 18 10*3/uL — CL (ref 150–400)
RBC: 3.87 MIL/uL — ABNORMAL LOW (ref 4.22–5.81)
RDW: 19.5 % — ABNORMAL HIGH (ref 11.5–15.5)
WBC: 2.2 10*3/uL — ABNORMAL LOW (ref 4.0–10.5)
nRBC: 0 % (ref 0.0–0.2)

## 2022-05-27 LAB — CBC
HCT: 32.7 % — ABNORMAL LOW (ref 39.0–52.0)
Hemoglobin: 10.6 g/dL — ABNORMAL LOW (ref 13.0–17.0)
MCH: 27.1 pg (ref 26.0–34.0)
MCHC: 32.4 g/dL (ref 30.0–36.0)
MCV: 83.6 fL (ref 80.0–100.0)
Platelets: 28 10*3/uL — CL (ref 150–400)
RBC: 3.91 MIL/uL — ABNORMAL LOW (ref 4.22–5.81)
RDW: 19.7 % — ABNORMAL HIGH (ref 11.5–15.5)
WBC: 2.3 10*3/uL — ABNORMAL LOW (ref 4.0–10.5)
nRBC: 0 % (ref 0.0–0.2)

## 2022-05-27 LAB — URINE CULTURE: Culture: >=100000 — AB

## 2022-05-27 LAB — COMPREHENSIVE METABOLIC PANEL
ALT: 61 U/L — ABNORMAL HIGH (ref 0–44)
AST: 87 U/L — ABNORMAL HIGH (ref 15–41)
Albumin: 3.4 g/dL — ABNORMAL LOW (ref 3.5–5.0)
Alkaline Phosphatase: 70 U/L (ref 38–126)
Anion gap: 5 (ref 5–15)
BUN: 8 mg/dL (ref 6–20)
CO2: 30 mmol/L (ref 22–32)
Calcium: 8.1 mg/dL — ABNORMAL LOW (ref 8.9–10.3)
Chloride: 94 mmol/L — ABNORMAL LOW (ref 98–111)
Creatinine, Ser: 0.61 mg/dL (ref 0.61–1.24)
GFR, Estimated: >60 mL/min (ref >60)
Glucose, Bld: 130 mg/dL — ABNORMAL HIGH (ref 70–99)
Potassium: 2.9 mmol/L — ABNORMAL LOW (ref 3.5–5.1)
Sodium: 129 mmol/L — ABNORMAL LOW (ref 135–145)
Total Bilirubin: 2.8 mg/dL — ABNORMAL HIGH (ref 0.3–1.2)
Total Protein: 6.4 g/dL — ABNORMAL LOW (ref 6.5–8.1)

## 2022-05-27 LAB — TYPE AND SCREEN
ABO/RH(D): A NEG
Antibody Screen: NEGATIVE

## 2022-05-27 LAB — MAGNESIUM: Magnesium: 1.9 mg/dL (ref 1.7–2.4)

## 2022-05-27 LAB — PHOSPHORUS: Phosphorus: 2.5 mg/dL (ref 2.5–4.6)

## 2022-05-27 MED ORDER — CHLORDIAZEPOXIDE HCL 25 MG PO CAPS
25.0000 mg | ORAL_CAPSULE | Freq: Every day | ORAL | Status: AC
  Administered 2022-05-27 – 2022-05-29 (×3): 25 mg via ORAL
  Filled 2022-05-27 (×3): qty 1

## 2022-05-27 MED ORDER — SODIUM CHLORIDE 0.9% IV SOLUTION: Freq: Once | INTRAVENOUS | Status: AC

## 2022-05-27 MED ORDER — POTASSIUM CHLORIDE CRYS ER 10 MEQ PO TBCR
40.0000 meq | EXTENDED_RELEASE_TABLET | ORAL | Status: AC
  Administered 2022-05-27 (×2): 40 meq via ORAL
  Filled 2022-05-27 (×2): qty 4

## 2022-05-27 MED ORDER — IOHEXOL 300 MG/ML  SOLN
100.0000 mL | Freq: Once | INTRAMUSCULAR | Status: AC | PRN
  Administered 2022-05-27: 100 mL via INTRAVENOUS

## 2022-05-27 MED ORDER — FUROSEMIDE 10 MG/ML IJ SOLN
20.0000 mg | Freq: Once | INTRAMUSCULAR | Status: AC
  Administered 2022-05-27: 20 mg via INTRAVENOUS
  Filled 2022-05-27: qty 2

## 2022-05-27 NOTE — Progress Notes (Signed)
OT Cancellation Note  Patient Details Name: [name] MRN: [id number] DOB: June 29, 1970   Cancelled Treatment:    Reason Eval/Treat Not Completed: Patient declined, no reason specified Patient refused to participate reporting increased urination with primo fit in place. Patient was not receptive to education at this time. OT to continue to follow and check back as schedule will allow.  Rennie Plowman, MS Acute Rehabilitation Department Office# [phone]  Willa Rough 05/27/2022, 8:45 AM

## 2022-05-27 NOTE — Progress Notes (Signed)
PT Cancellation Note  Patient Details Name: [name] MRN: [id number] DOB: 03/07/1971   Cancelled Treatment:    Reason Eval/Treat Not Completed: Patient declined, expresses concern about  urinating so much. Will check back another time.  [place] Office [phone] Weekend [phone]  Claretha Cooper 05/27/2022, 11:40 AM

## 2022-05-27 NOTE — Plan of Care (Signed)
PCCM Plan of Care Note  Will sign off. Glad to be reinvolved however if develops agitation requiring precedex, etc.   [place]

## 2022-05-27 NOTE — Progress Notes (Signed)
  Echocardiogram 2D Echocardiogram has been performed.  [name] 05/27/2022, 11:31 AM

## 2022-05-27 NOTE — Progress Notes (Signed)
[facility] for Infectious Disease    Date of Admission:  05/22/2022   Total days of antibiotics 2   ID: Tesean Sugerman is a 52 y.o. male with MSSA bacteremia (hospital onset vs. Late dx)  Principal Problem:   Other pancytopenia ([place]) Active Problems:   Alcohol abuse   Cirrhosis of liver ([place])   Hypertension   Alcohol withdrawal with delirium ([place])   Hypokalemia   Hypomagnesemia   DM (diabetes mellitus), type 2 (HCC)   Liver mass   Benign prostatic hyperplasia (BPH) with straining on urination   History of eye trauma   Acute alcoholic intoxication without complication (HCC)   Hematoma of right flank   Hypophosphatemia   Hyponatremia   Hypoxia   UTI (urinary tract infection)   Fever   MSSA bacteremia    Subjective: Afebrile but received 2.'650mg'$  of acetominophen. Feeling alittle bit better. No nausea and vomiting/diarrhea. Some soreness to right AC  Medications:   sodium chloride   Intravenous Once   amLODipine  10 mg Oral Daily   chlordiazePOXIDE  25 mg Oral Daily   Chlorhexidine Gluconate Cloth  6 each Topical Daily   diclofenac Sodium  2 g Topical QID   folic acid  1 mg Oral Daily   furosemide  20 mg Oral Daily   insulin aspart  0-15 Units Subcutaneous TID WC   insulin aspart  0-5 Units Subcutaneous QHS   lactulose  30 g Oral Daily   mirtazapine  15 mg Oral QHS   multivitamin with minerals  1 tablet Oral Daily   pantoprazole (PROTONIX) IV  40 mg Intravenous Q12H   potassium chloride  40 mEq Oral Q4H   spironolactone  25 mg Oral Daily   tamsulosin  0.4 mg Oral QPC supper   thiamine  100 mg Oral Daily   Or   thiamine  100 mg Intravenous Daily    Objective: Vital signs in last 24 hours: Temp:  [98.1 F (36.7 C)-99.1 F (37.3 C)] 98.1 F (36.7 C) (03/05 0800) Pulse Rate:  [81-125] 113 (03/05 1230) Resp:  [12-23] 19 (03/05 0800) BP: (95-164)/(50-79) 123/67 (03/05 1230) SpO2:  [90 %-98 %] 98 % (03/05 0800) Weight:  [66.9 kg] 66.9 kg (03/05  0500)  Physical Exam  Constitutional: He is oriented to person, place, and time. He appears well-developed and well-nourished. No distress.  HENT:  Mouth/Throat: Oropharynx is clear and moist. No oropharyngeal exudate.  Cardiovascular: Normal rate, regular rhythm and normal heart sounds. Exam reveals no gallop and no friction rub.  No murmur heard.  Pulmonary/Chest: Effort normal and breath sounds normal. No respiratory distress. He has no wheezes.  Abdominal: Soft. Bowel sounds are normal. He exhibits no distension. There is no tenderness.  Lymphadenopathy:  He has no cervical adenopathy.  Neurological: He is alert and oriented to person, place, and time.  Skin: Skin is warm and dry. Scattered ecchymosis. And purulence to right AC PIV. Psychiatric: He has a normal mood and affect. His behavior is normal.    Lab Results Recent Labs    05/26/22 0322 05/27/22 0315  WBC 1.6* 2.2*  HGB 10.7* 10.5*  HCT 31.7* 32.1*  NA 132* 129*  K 3.0* 2.9*  CL 92* 94*  CO2 28 30  BUN 6 8  CREATININE 0.57* 0.61   Liver Panel Recent Labs    05/26/22 0322 05/27/22 0315  PROT 6.1* 6.4*  ALBUMIN 3.3* 3.4*  AST 79* 87*  ALT 61* 61*  ALKPHOS 64  70  BILITOT 3.2* 2.8*   Sedimentation Rate No results for input(s): "ESRSEDRATE" in the last 72 hours. C-Reactive Protein No results for input(s): "CRP" in the last 72 hours.  Microbiology: 3/03 blood cx MSSA 3/03 urine cx MSSA  Studies/Results: ECHOCARDIOGRAM COMPLETE  Result Date: 05/27/2022    ECHOCARDIOGRAM REPORT   Patient Name:   ARSLAN COPUS Date of Exam: 05/27/2022 Medical Rec #:  [id number]            Height:       67.0 in Accession #:    [id number]           Weight:       147.5 lb Date of Birth:  [birth year]            BSA:          1.777 m Patient Age:    42 years             BP:           126/72 mmHg Patient Gender: M                    HR:           100 bpm. Exam Location:  Inpatient Procedure: 2D Echo Indications:    bacteremia   History:        Patient has no prior history of Echocardiogram examinations.                 Risk Factors:Diabetes and Hypertension.  Sonographer:    Harvie Junior Referring Phys: Noank  1. Left ventricular ejection fraction, by estimation, is 60 to 65%. The left ventricle has normal function. The left ventricle has no regional wall motion abnormalities. Left ventricular diastolic parameters were normal.  2. Right ventricular systolic function is normal. The right ventricular size is normal. There is normal pulmonary artery systolic pressure. The estimated right ventricular systolic pressure is 123456 mmHg.  3. The mitral valve is grossly normal. No evidence of mitral valve regurgitation. No evidence of mitral stenosis.  4. The aortic valve is tricuspid. Aortic valve regurgitation is not visualized. No aortic stenosis is present.  5. The inferior vena cava is normal in size with greater than 50% respiratory variability, suggesting right atrial pressure of 3 mmHg. Conclusion(s)/Recommendation(s): No evidence of valvular vegetations on this transthoracic echocardiogram. Consider a transesophageal echocardiogram to exclude infective endocarditis if clinically indicated. FINDINGS  Left Ventricle: Left ventricular ejection fraction, by estimation, is 60 to 65%. The left ventricle has normal function. The left ventricle has no regional wall motion abnormalities. The left ventricular internal cavity size was normal in size. There is  no left ventricular hypertrophy. Left ventricular diastolic parameters were normal. Right Ventricle: The right ventricular size is normal. No increase in right ventricular wall thickness. Right ventricular systolic function is normal. There is normal pulmonary artery systolic pressure. The tricuspid regurgitant velocity is 2.13 m/s, and  with an assumed right atrial pressure of 3 mmHg, the estimated right ventricular systolic pressure is 123456 mmHg. Left Atrium: Left  atrial size was normal in size. Right Atrium: Right atrial size was normal in size. Pericardium: There is no evidence of pericardial effusion. Mitral Valve: The mitral valve is grossly normal. No evidence of mitral valve regurgitation. No evidence of mitral valve stenosis. Tricuspid Valve: The tricuspid valve is grossly normal. Tricuspid valve regurgitation is not demonstrated. No evidence of tricuspid stenosis. Aortic Valve: The aortic valve is tricuspid.  Aortic valve regurgitation is not visualized. No aortic stenosis is present. Aortic valve mean gradient measures 8.0 mmHg. Aortic valve peak gradient measures 15.1 mmHg. Aortic valve area, by VTI measures 1.73  cm. Pulmonic Valve: The pulmonic valve was grossly normal. Pulmonic valve regurgitation is trivial. No evidence of pulmonic stenosis. Aorta: The aortic root is normal in size and structure. Venous: The inferior vena cava is normal in size with greater than 50% respiratory variability, suggesting right atrial pressure of 3 mmHg. IAS/Shunts: The atrial septum is grossly normal.  LEFT VENTRICLE PLAX 2D LVIDd:         5.10 cm      Diastology LVIDs:         3.20 cm      LV e' medial:    7.51 cm/s LV PW:         0.90 cm      LV E/e' medial:  8.7 LV IVS:        0.90 cm      LV e' lateral:   10.30 cm/s LVOT diam:     2.00 cm      LV E/e' lateral: 6.4 LV SV:         50 LV SV Index:   28 LVOT Area:     3.14 cm  LV Volumes (MOD) LV vol d, MOD A2C: 96.7 ml LV vol d, MOD A4C: 152.0 ml LV vol s, MOD A2C: 34.0 ml LV vol s, MOD A4C: 47.4 ml LV SV MOD A2C:     62.7 ml LV SV MOD A4C:     152.0 ml LV SV MOD BP:      80.0 ml RIGHT VENTRICLE RV Basal diam:  3.50 cm RV Mid diam:    2.80 cm LEFT ATRIUM             Index        RIGHT ATRIUM           Index LA diam:        3.80 cm 2.14 cm/m   RA Area:     13.70 cm LA Vol (A2C):   51.4 ml 28.93 ml/m  RA Volume:   30.60 ml  17.22 ml/m LA Vol (A4C):   41.9 ml 23.58 ml/m LA Biplane Vol: 47.1 ml 26.51 ml/m  AORTIC VALVE                      PULMONIC VALVE AV Area (Vmax):    1.79 cm      PV Vmax:          1.19 m/s AV Area (Vmean):   1.70 cm      PV Peak grad:     5.7 mmHg AV Area (VTI):     1.73 cm      PR End Diast Vel: 4.75 msec AV Vmax:           194.50 cm/s AV Vmean:          129.000 cm/s AV VTI:            0.288 m AV Peak Grad:      15.1 mmHg AV Mean Grad:      8.0 mmHg LVOT Vmax:         111.00 cm/s LVOT Vmean:        69.800 cm/s LVOT VTI:          0.159 m LVOT/AV VTI ratio: 0.55  AORTA Ao Root diam: 3.60 cm MITRAL VALVE  TRICUSPID VALVE MV Area (PHT): 5.09 cm    TR Peak grad:   18.1 mmHg MV Decel Time: 149 msec    TR Vmax:        213.00 cm/s MV E velocity: 65.65 cm/s MV A velocity: 75.15 cm/s  SHUNTS MV E/A ratio:  0.87        Systemic VTI:  0.16 m                            Systemic Diam: 2.00 cm Eleonore Chiquito MD Electronically signed by Eleonore Chiquito MD Signature Date/Time: 05/27/2022/12:36:31 PM    Final    DG CHEST PORT 1 VIEW  Result Date: 05/25/2022 CLINICAL DATA:  Hypoxia EXAM: PORTABLE CHEST 1 VIEW COMPARISON:  X-ray 05/22/2022 FINDINGS: Underinflation with enlarged cardiopericardial silhouette and new vascular congestion. No pneumothorax or effusion. No consolidation. Overlapping cardiac leads. Film is rotated. IMPRESSION: Enlarged cardiopericardial silhouette with new vascular congestion. Underinflated x-ray Electronically Signed   By: Jill Side M.D.   On: 05/25/2022 14:06     Assessment/Plan: Hospital onset bacteremia = exam suggests that his right PIV [facility] site is infected. This may have been nidus of infection. However, his baseline WBC is 2 and he was admitted with WBC 4 (maybe a relative leukocytosis for the patient) nonetheless, still concern for piv associated bacteremia. Continue on cefazolin  Please get ct of right upper extremity. Concern for soft tissue infection  Will repeat blood cx tomorrow so that he is clearing his bacteremia  Fever = curve is down however I suspect it is being  suppressed by antipyretics. Recommend to only give is temp is 100.37F or more. 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   [facility] for Infectious Diseases Pager: [phone]  05/27/2022, 12:37 PM

## 2022-05-27 NOTE — Assessment & Plan Note (Signed)
Likely seeded from urinary. -Patient seen in consultation by ID who are recommending repeat blood cultures today. -2D echo recommended to rule out endocarditis. -IV antibiotics narrowed down to IV Ancef per ID. -ID following and appreciate input and recommendations.

## 2022-05-27 NOTE — Evaluation (Signed)
Occupational Therapy Evaluation Patient Details Name: [name] MRN: [id number] DOB: 02-Feb-1971 Today's Date: 05/27/2022   History of Present Illness Patient is a 52 year old male who presented on 2/29 with recurrent falls in setting of ongoing drinking. Patient was admitted with chronic pancytopenia and severe thrombocytopenia. Patient received two units of platelets. PMH: liver mass, cirrhosis of liver, alcohol abuse, eye trauma, DM   Clinical Impression   Patient is a 52 year old male who was admitted for above. Patient was living at home independently with wife prior level. Currently, patient is min guard with decreased functional activity tolerance, decreased standing balance and poor safety awareness when standing.Patient was noted to have HR reach 129 bpm while preforming functional mobility in hallway with IV pole with unsteady gait. Anticipate patient will be able to return to PLOF prior to d/c from acute stay. Patient would continue to benefit from skilled OT services at this time while admitted to address noted deficits in order to improve overall safety and independence in ADLs.       Recommendations for follow up therapy are one component of a multi-disciplinary discharge planning process, led by the attending physician.  Recommendations may be updated based on patient status, additional functional criteria and insurance authorization.   Follow Up Recommendations  No OT follow up     Assistance Recommended at Discharge Frequent or constant Supervision/Assistance  Patient can return home with the following A little help with walking and/or transfers;Assistance with cooking/housework;Direct supervision/assist for medications management;Assist for transportation;Help with stairs or ramp for entrance;Direct supervision/assist for financial management;A little help with bathing/dressing/bathroom    Functional Status Assessment  Patient has had a recent decline in their  functional status and demonstrates the ability to make significant improvements in function in a reasonable and predictable amount of time.  Equipment Recommendations  None recommended by OT       Precautions / Restrictions Precautions Precautions: Fall (Simultaneous filing. User may not have seen previous data.) Precaution Comments: monitor HR (Simultaneous filing. User may not have seen previous data.) Restrictions Weight Bearing Restrictions: No      Mobility Bed Mobility Overal bed mobility: Needs Assistance Bed Mobility: Supine to Sit, Sit to Supine     Supine to sit: Supervision     General bed mobility comments: cues for safety    Transfers Overall transfer level: Needs assistance   Transfers: Sit to/from Stand Sit to Stand: Min guard           General transfer comment: extra time/effort to stand, wide base      Balance Overall balance assessment: Mild deficits observed, not formally tested           ADL either performed or assessed with clinical judgement   ADL Overall ADL's : Needs assistance/impaired Eating/Feeding: Supervision/ safety;Sitting Eating/Feeding Details (indicate cue type and reason): taking sips from cup with straw in room with MI Grooming: Set up;Sitting   Upper Body Bathing: Set up;Sitting   Lower Body Bathing: Moderate assistance Lower Body Bathing Details (indicate cue type and reason): difficulty with LB with increased pain in back Upper Body Dressing : Set up;Sitting   Lower Body Dressing: Moderate assistance;Sitting/lateral leans   Toilet Transfer: Min guard;Ambulation Toilet Transfer Details (indicate cue type and reason): into hallway with strong hold on IV pole. patient notable unsteady and poor safety awareness with movements. 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  this is a known issue for patient            Pertinent Vitals/Pain Pain Assessment Pain Assessment: Faces     Hand Dominance Right   Extremity/Trunk Assessment Upper Extremity Assessment Upper Extremity Assessment: Overall WFL for tasks assessed (noted to have extensive brusing on L upper arm and back on R side.)   Lower Extremity Assessment Lower Extremity Assessment: Defer to PT evaluation   Cervical / Trunk Assessment Cervical / Trunk Assessment: Other exceptions Cervical / Trunk Exceptions: guarding the back, slightly flexed   Communication Communication Communication: No difficulties   Cognition Arousal/Alertness: Awake/alert Behavior During Therapy: WFL for tasks assessed/performed Overall Cognitive Status: Within Functional Limits for tasks assessed       General Comments: cooperative this visit. noted to have minimal confusion on date but appropiate overall.     General Comments  extensive brusing on back and L upper arm noted. patient reported pain in band around back            [place] expects to be discharged to:: Private residence Living Arrangements: Spouse/significant other Available Help at Discharge: Family;Available PRN/intermittently Type of Home: House Home Access: Stairs to enter;Level entry     Home Layout: One level     Bathroom Shower/Tub: Teacher, early years/pre: Standard     Home Equipment: None          Prior Functioning/Environment Prior Level of Function : Working/employed             Mobility Comments: no device          OT Problem List: Decreased activity tolerance;Impaired balance (sitting and/or standing);Decreased coordination;Decreased safety awareness;Decreased knowledge of precautions;Pain      OT Treatment/Interventions: Self-care/ADL training;Energy conservation;Therapeutic exercise;DME and/or AE instruction;Therapeutic activities;Patient/family education;Balance training     OT Goals(Current goals can be found in the care plan section) Acute Rehab OT Goals Patient Stated Goal: to go home soon OT Goal Formulation: With patient Time For Goal Achievement: 06/10/22 Potential to Achieve Goals: Fair  OT Frequency: Min 2X/week    Co-evaluation PT/OT/SLP Co-Evaluation/Treatment: Yes Reason for Co-Treatment: Necessary to address cognition/behavior during functional activity PT goals addressed during session: Mobility/safety with mobility OT goals addressed during session: ADL's and self-care      AM-PAC OT "6 Clicks" Daily Activity     Outcome Measure Help from another person eating meals?: None Help from another person taking care of personal grooming?: A Little Help from another person toileting, which includes using toliet, bedpan, or urinal?: A Little Help from another person bathing (including washing, rinsing, drying)?: A Little Help from another person to put on and taking off regular upper body clothing?: A Little Help from another person to put on and taking off regular lower body clothing?: A Lot 6 Click Score: 18   End of Session Equipment Utilized During Treatment: Gait belt Nurse Communication: Other (comment) (ok to participate in session)  Activity Tolerance: Patient tolerated treatment well Patient left: in bed;with call bell/phone within reach;with bed alarm set  OT Visit Diagnosis: Unsteadiness on feet (R26.81);Other abnormalities of gait and mobility (R26.89);Muscle weakness (generalized) (M62.81)                Time: [id number] OT Time Calculation (min): 17 min Charges:  OT General Charges $OT Visit: 1 Visit OT Evaluation $OT Eval Low Complexity: 1 Low  Bettye Sitton  OTR/L, MS Acute Rehabilitation Department Office# [phone]   Willa Rough 05/27/2022, 4:05 PM

## 2022-05-27 NOTE — Evaluation (Addendum)
Physical Therapy Evaluation Patient Details Name: [name] MRN: [id number] DOB: 08/21/1970 Today's Date: 05/27/2022  History of Present Illness  Patient is a 52 year old male who presented on 2/29 with recurrent falls in setting of ongoing drinking. Patient was admitted with chronic pancytopenia and severe thrombocytopenia. Patient received two units of platelets. PMH: liver mass, cirrhosis of liver, alcohol abuse, eye trauma, DM  Clinical Impression  Pt admitted with above diagnosis.  Pt currently with functional limitations due to the deficits listed below (see PT Problem List). Pt will benefit from skilled PT to increase their independence and safety with mobility to allow discharge to the venue listed below.    The patient agreeable to ambulate. Patient ambulated x 400', pushing IV pole majority of distance. Patient did  demonstrate  ambulation  without pole but  reported increased  back pain without support. Continue PT for mobility.  HR max 127 while ambulating.         Recommendations for follow up therapy are one component of a multi-disciplinary discharge planning process, led by the attending physician.  Recommendations may be updated based on patient status, additional functional criteria and insurance authorization.  Follow Up Recommendations No PT follow up      Assistance Recommended at Discharge PRN  Patient can return home with the following  Assistance with cooking/housework;Assist for transportation    Equipment Recommendations None recommended by PT  Recommendations for Other Services       Functional Status Assessment Patient has had a recent decline in their functional status and demonstrates the ability to make significant improvements in function in a reasonable and predictable amount of time.     Precautions / Restrictions Precautions Precautions: Fall (Simultaneous filing. User may not have seen previous data.) Precaution Comments: monitor HR  (Simultaneous filing. User may not have seen previous data.) Restrictions Weight Bearing Restrictions: No      Mobility  Bed Mobility Overal bed mobility: Needs Assistance Bed Mobility: Supine to Sit, Sit to Supine     Supine to sit: Supervision Sit to supine: Supervision   General bed mobility comments: cues for safety    Transfers Overall transfer level: Needs assistance   Transfers: Sit to/from Stand Sit to Stand: Min guard           General transfer comment: extra time/effort to stand, wide base    Ambulation/Gait Ambulation/Gait assistance: Min guard, +2 safety/equipment Gait Distance (Feet): 400 Feet Assistive device: IV Pole Gait Pattern/deviations: Step-through pattern, Decreased stride length       General Gait Details: patient did ambulate  x ~ 30' without holding IV pole, gait was steady but patient requested to support on the pole due to reports that back pain was worse.  Stairs            Wheelchair Mobility    Modified Rankin (Stroke Patients Only)       Balance Overall balance assessment: Mild deficits observed, not formally tested                                           Pertinent Vitals/Pain Pain Assessment Pain Assessment: Faces Faces Pain Scale: Hurts whole lot Pain Location: back Pain Descriptors / Indicators: Aching, Discomfort, Guarding, Grimacing Pain Intervention(s): Monitored during session    Home Living Family/patient expects to be discharged to:: Private residence Living Arrangements: Spouse/significant other Available Help at  Discharge: Family;Available PRN/intermittently Type of Home: House Home Access: Stairs to enter;Level entry       Home Layout: One level Home Equipment: None      Prior Function Prior Level of Function : Working/employed             Mobility Comments: no device       Hand Dominance   Dominant Hand: Right    Extremity/Trunk Assessment        Lower  Extremity Assessment Lower Extremity Assessment: Generalized weakness    Cervical / Trunk Assessment Cervical / Trunk Assessment: Other exceptions Cervical / Trunk Exceptions: guarding the back, slightly flexed  Communication   Communication: No difficulties  Cognition Arousal/Alertness: Awake/alert Behavior During Therapy: WFL for tasks assessed/performed Overall Cognitive Status: Within Functional Limits for tasks assessed                                 General Comments: cooperative this visit        General Comments General comments (skin integrity, edema, etc.): extensive brusing on back and L upper arm noted. patient reported pain in band around back    Exercises     Assessment/Plan    PT Assessment Patient needs continued PT services  PT Problem List Decreased activity tolerance;Decreased mobility;Decreased safety awareness;Cardiopulmonary status limiting activity;Decreased knowledge of use of DME;Pain       PT Treatment Interventions DME instruction;Therapeutic activities;Gait training;Functional mobility training;Therapeutic exercise;Patient/family education    PT Goals (Current goals can be found in the Care Plan section)  Acute Rehab PT Goals Patient Stated Goal: to go home PT Goal Formulation: With patient Time For Goal Achievement: 06/10/22 Potential to Achieve Goals: Good    Frequency Min 3X/week     Co-evaluation   Reason for Co-Treatment: Necessary to address cognition/behavior during functional activity PT goals addressed during session: Mobility/safety with mobility OT goals addressed during session: ADL's and self-care       AM-PAC PT "6 Clicks" Mobility  Outcome Measure Help needed turning from your back to your side while in a flat bed without using bedrails?: None Help needed moving from lying on your back to sitting on the side of a flat bed without using bedrails?: None Help needed moving to and from a bed to a chair  (including a wheelchair)?: A Little Help needed standing up from a chair using your arms (e.g., wheelchair or bedside chair)?: A Little Help needed to walk in hospital room?: A Little Help needed climbing 3-5 steps with a railing? : A Little 6 Click Score: 20    End of Session Equipment Utilized During Treatment: Gait belt Activity Tolerance: Patient tolerated treatment well Patient left: in bed;with bed alarm set;with call bell/phone within reach Nurse Communication: Mobility status PT Visit Diagnosis: Unsteadiness on feet (R26.81);Pain    Time: [id number] PT Time Calculation (min) (ACUTE ONLY): 15 min   Charges:   PT Evaluation $PT Eval Low Complexity: 1 Low          [facility] Office [phone] Weekend [id number]  Claretha Cooper 05/27/2022, 3:52 PM

## 2022-05-27 NOTE — Progress Notes (Signed)
PROGRESS NOTE    Tacoma Drumm  [id number] DOB: 03/25/1970 DOA: 05/22/2022 PCP: [name], MD    Chief Complaint  Patient presents with   Alcohol Problem    Brief Narrative:  HPI per Dr. Mylo Red Rishav Maye is a 52 y.o. male with medical history significant of ongoing EtOH abuse, cirrhosis, HTN, DM2, portal hypertension with pancytopenia.   Pt in to ED at med center with c/o recurrent falls in setting of ongoing drinking.  He cannot tell me how much she is drinking. He does want to stop drinking and he said he has been here before and we prescribed him Librium which helped. He has various bruises over his body including a large bruise on his right flank. He thinks this is probably from falling.    Assessment & Plan:  Principal Problem:   Other pancytopenia ([place]) Active Problems:   Alcohol abuse   Cirrhosis of liver (HCC)   Liver mass   Hypomagnesemia   DM (diabetes mellitus), type 2 (HCC)   History of eye trauma   Hypertension   Alcohol withdrawal with delirium (HCC)   Hypokalemia   Benign prostatic hyperplasia (BPH) with straining on urination   Acute alcoholic intoxication without complication (HCC)   Hematoma of right flank   Hypophosphatemia   Hyponatremia   Hypoxia   UTI (urinary tract infection)   Fever   MSSA bacteremia    Assessment and Plan: * Other pancytopenia ([place]) Pt with chronic pancytopenia and severe thrombocytopenia that is believed to be "secondary to splenomegaly and alcohol abuse " as per oncologist Dr. Burr Medico note in Oct 2023. -Platelet count noted at 18 K this morning. -Patient status post 2 units of platelets 05/23/2022, 1 unit platelet 05/24/2022.  Patient received another unit of platelets 05/25/2022.. -Patient with multiple areas of ecchymosis/bruising however denies any overt bleeding. -Anemia panel consistent with anemia of chronic disease.  Iron level of 29, ferritin of 45, folate of 39.7, vitamin B12 of 706. -Change IV PPI  to oral PPI twice daily.  -Patient seen by hematology/oncology who feels patient's pancytopenia with worsening thrombocytopenia secondary to liver cirrhosis and alcohol toxicity induced bone marrow suppression. -Hematology/oncology recommending transfusion of a unit of platelets daily if platelet count < 20K due to current bacteremia-repeat blood cultures are negative transfusion threshold worsening platelet count < 15k.  -Transfuse 1 unit platelets. -Appreciate hematology input and recommendations.  Liver mass 4.4cm (previously 4.6cm) heterogenous lesion of anterior R lobe of liver.  Biopsy in Oct 2023 was neg for [facility] (just showed primarily necrotic tissue). Doesn't look to have enlarged on todays CT scan (actually was 4.6cm previously, 4.4cm today). Also has 1.6cm hypervascular lesion of L lobe of liver.  Looks like this was 1.0cm on prior CT, but they didn't mention it at all on MRI previously. 4.4cm lesion with neg pathology, doesn't seem to be enlarging on serial imaging exams. 1.6cm hypervascular lesion of L lobe of liver of unclear significance Hematology/oncology consulted and reviewed imaging and recommended to follow for now with outpatient liver MRI in 2 to 3 months.    Cirrhosis of liver (HCC) Pt with EtOH cirrhosis of liver, unfortunately continues to drink heavily. Mild transaminitis that looks to be baseline. Doesn't seem to be on any diuretics chronically prior to admission, no lactulose, etc. -Patient started on Lasix 20 mg daily and spironolactone 25 mg daily. -Follow electrolytes. -Will need outpatient follow-up with GI postdischarge.  Alcohol abuse Pt with EtOH intoxication on presentation noted to  have alcohol level of 415.   -Patient with concern for alcohol withdrawal. -Patient states drinks about 20 beers daily and has been drinking since age 52.   -Patient states he is ready for alcohol cessation and will not drink again posthospitalization.   -Patient noted to be  tachycardic, some clamminess/diaphoretic on exam on 05/25/2022, per RN when patient stands up with significant tremors noted. -Continue Librium detox protocol.   -Continue stepdown Ativan CIWA protocol, thiamine, folic acid, multivitamin.   -Patient noted with a CIWA score of 20 on the morning of 05/26/2022. -Patient assessed by PCCM and extended librium taper, recommended continuation of Ativan protocol and have signed off.  History of eye trauma Pt has h/o traumatic injury to R eye in March 2023, with chronic dilated pupil and very poor vision in that eye ever since.  Not on any eye-drops chronically.  He confirms that none of this is new or changed recently though and review of 06/01/21 ED visit note and 11/26/21 optho appointment note seem to confirm that this is a chronic issue.  DM (diabetes mellitus), type 2 (HCC) - Hemoglobin A1c 7.9  (04/03/2022 ) -CBG 160 this morning. -Continue sliding scale insulin.    Hypomagnesemia - Mag level noted at 1.6 on presentation, noted to have received magnesium in the ED.   -Status post IV magnesium.   -Magnesium of 1.9 this morning.   -Repeat labs in the morning.  MSSA bacteremia Likely seeded from urinary. -Patient seen in consultation by ID who are recommending repeat blood cultures today. -2D echo recommended to rule out endocarditis. -IV antibiotics narrowed down to IV Ancef per ID. -ID following and appreciate input and recommendations.  Fever -Patient noted to have intact as high as 103. -Patient pancultured, urinalysis concerning for UTI. -Blood cultures obtained pending with 2/4 cultures with MSSA. - IV rocephin narrowed to IV ancef per ID.  UTI (urinary tract infection) -Patient noted to spike a fever yesterday. -Urinalysis concerning for UTI. -Urine cultures with > 100,000 colonies of Staph aureus. -Patient started on IV Rocephin, was seen by ID due to MSSA bacteremia and antibiotics narrowed down to IV Ancef per ID  Hypoxia -Patient  noted to be hypoxic the evening of 05/24/2022, per RN requiring 2 L nasal cannula. -Per RN patient with some bouts of apnea noted when sleeping, however when awake sats noted to be greater than 94% on room air. -Concern for possible OSA. -Patient also noted to be receiving platelets during the hospitalization. -BNP noted at 110.3. -Chest x-ray concerning for volume overload -SARS coronavirus 2 PCR negative. -Influenza A and B PCR negative. -Respiratory viral panel negative. -Patient received IV Lasix during this hospitalization as patient received multiple platelet transfusions, urine output of 1.660 L over the past 24 hours.  -Hypoxia seems to be improving however still needs evaluation for OSA in the outpatient setting. -Give a dose of Lasix 40 mg IV x 1 today with platelet transfusion. -I's and O's, daily weights. -Will likely need outpatient evaluation for OSA.  Hyponatremia -Patient with a sodium level of 127 on 05/25/2022, was 124 on admission. -Patient does not look volume overloaded on examination however has been receiving some platelets during this hospitalization. -Patient also started on Lasix and spironolactone during this hospitalization. -Urinalysis with large leukocytes, nitrite positive, 50 of glucose, 30 protein, rare bacteria, WBC clumps present, WBC  > 20. -Urine sodium of 84, urine creatinine 188, urine osmolality 685. -Serum osmolality of 270.  -Chest x-ray done was concerning for  volume overload.  -BNP noted at 110.3.  -Patient received Lasix 40 mg IV x 1 with improvement with hyponatremia sodium currently at 129.  -Will give another dose of IV Lasix today with platelet transfusions.  -Follow.  Hypophosphatemia - Phosphorus repleted currently at 2.5.   -Repeat labs in the AM.   Hematoma of right flank -Patient with large hematoma of the right flank area complaining of pain. -Secondary to fall in the setting of pancytopenia with severe thrombocytopenia. -Warm  compresses. -Pain management.  Benign prostatic hyperplasia (BPH) with straining on urination -Continue Flomax.   Hypokalemia -Magnesium repleted currently at 1.9 this morning.   -Potassium 2.9.   -K-Dur 40 mEq p.o. every 4 hours x 2 doses.   -Repeat labs in the AM.  Alcohol withdrawal with delirium ([place]) -Patient with concern for ongoing alcohol withdrawal. -See alcohol abuse above. -Patient noted with a CIWA score of 20 this morning. -Continue Librium detox protocol, stepdown Ativan withdrawal protocol. -PCCM consulted and recommended extension of Librium taper.  Otherwise no further recommendations and signed off.  Hypertension Patient with elevated blood pressure likely component of alcohol abuse/withdrawal. -Continue Norvasc 10 mg daily, Lasix, spironolactone which was started for cirrhosis.  Continue home regimen Flomax..  -IV hydralazine as needed.         DVT prophylaxis: SCDs Code Status: Full Family Communication: Updated patient.  No family at bedside. Disposition: Remain in stepdown unit.  Likely home when clinically improved.  Status is: Inpatient Remains inpatient appropriate because: Severity of illness   Consultants:  Hematology/oncology: Dr. Burr Medico 05/23/2022 PCCM: Dr Verlee Monte 05/26/2022 ID: Dr. Baxter Flattery [id number]  Procedures:  CT abdomen and pelvis 05/22/2022 Transfusion 2 units platelets 05/23/2022 Transfusion 1 unit platelet  05/24/2022 Transfuse 1 unit platelets 05/25/2022 Chest x-ray 05/25/2022  Antimicrobials:  IV Rocephin 05/25/2022>>>>>>> 05/26/2022 IV Vanc 05/26/2022>>> 05/26/2022 IV Ancef 05/26/2022>>>>   Subjective: Laying in bed getting cleaned up.  Still with some complaints of right flank pain.  Overall feeling better.  No chest pain.  No shortness of breath.  No abdominal pain.  Tolerating oral intake.  Patient states wants to be up and moving around.  Patient denies any overt bleeding.  Objective: Vitals:   05/27/22 0700 05/27/22 0800 05/27/22 0900  05/27/22 0916  BP: 126/72 127/70 (!) 164/78 (!) 164/78  Pulse: (!) 110   100  Resp: 19 19    Temp:  98.1 F (36.7 C)    TempSrc:  Oral    SpO2: 97% 98%    Weight:      Height:        Intake/Output Summary (Last 24 hours) at 05/27/2022 1117 Last data filed at 05/27/2022 1012 Gross per 24 hour  Intake 297.7 ml  Output 1360 ml  Net -1062.3 ml   Filed Weights   05/25/22 0749 05/26/22 0701 05/27/22 0500  Weight: 70.2 kg 68.2 kg 66.9 kg    Examination:  General exam: NAD.  98% on room air. Respiratory system: CTAB anterior lung fields.  No wheezing.  No rhonchi.  Fair air movement.  Speaking in full sentences.  Cardiovascular system: Tachycardia.  No murmurs rubs or gallops.  No JVD.  No significant lower extremity edema. Gastrointestinal system: Abdomen is soft, nontender, nondistended, positive bowel sounds.  No rebound.  No guarding.  Central nervous system: Alert and oriented. No focal neurological deficits. Extremities: Symmetric 5 x 5 power. Skin: Multiple areas of ecchymosis over his skin including large area of ecchymosis in the right flank/lower back region.  Left upper extremity ecchymosis.  Left lower extremity ecchymosis.  Psychiatry: Judgement and insight appear fair. Mood & affect appropriate.     Data Reviewed:   CBC: Recent Labs  Lab 05/24/22 1358 05/25/22 0304 05/25/22 1507 05/26/22 0322 05/27/22 0315  WBC 2.1* 1.5* 1.2* 1.6* 2.2*  NEUTROABS 1.5* 1.1* 0.7* 0.9* 0.9*  HGB 8.8* 9.7* 8.2* 10.7* 10.5*  HCT 26.9* 28.6* 24.8* 31.7* 32.1*  MCV 82.5 81.7 82.9 82.1 82.9  PLT 16* 11* 15* 16* 18*    Basic Metabolic Panel: Recent Labs  Lab 05/23/22 0348 05/24/22 0249 05/25/22 0542 05/26/22 0322 05/27/22 0315  NA 131* 131* 127* 132* 129*  K 3.6 3.3* 3.5 3.0* 2.9*  CL 95* 91* 90* 92* 94*  CO2 '24 25 27 28 30  '$ GLUCOSE 213* 150* 176* 127* 130*  BUN 5* '7 9 6 8  '$ CREATININE 0.56* 0.52* 0.73 0.57* 0.61  CALCIUM 7.7* 8.2* 8.2* 8.2* 8.1*  MG 2.1 1.8 1.6* 1.8 1.9   PHOS  --  2.6 2.0* 3.0 2.5    GFR: Estimated Creatinine Clearance: 99.8 mL/min (by C-G formula based on SCr of 0.61 mg/dL).  Liver Function Tests: Recent Labs  Lab 05/23/22 0348 05/24/22 0249 05/25/22 0542 05/26/22 0322 05/27/22 0315  AST 127* 93* 70* 79* 87*  ALT 100* 83* 68* 61* 61*  ALKPHOS 66 70 67 64 70  BILITOT 3.2* 3.3* 3.2* 3.2* 2.8*  PROT 6.2* 6.0* 6.3* 6.1* 6.4*  ALBUMIN 3.4* 3.4* 3.4* 3.3* 3.4*    CBG: Recent Labs  Lab 05/26/22 0843 05/26/22 1224 05/26/22 1647 05/26/22 2156 05/27/22 0801  GLUCAP 171* 152* 139* 192* 160*     Recent Results (from the past 240 hour(s))  MRSA Next Gen by PCR, Nasal     Status: None   Collection Time: 05/23/22 12:26 AM   Specimen: Nasal Mucosa; Nasal Swab  Result Value Ref Range Status   MRSA by PCR Next Gen NOT DETECTED NOT DETECTED Final    Comment: (NOTE) The GeneXpert MRSA Assay (FDA approved for NASAL specimens only), is one component of a comprehensive MRSA colonization surveillance program. It is not intended to diagnose MRSA infection nor to guide or monitor treatment for MRSA infections. Test performance is not FDA approved in patients less than 43 years old. Performed at [facility], [place] [address], [place], [place] [address]   Urine Culture (for pregnant, neutropenic or urologic patients or patients with an indwelling urinary catheter)     Status: Abnormal   Collection Time: 05/25/22 12:48 PM   Specimen: Urine, Clean Catch  Result Value Ref Range Status   Specimen Description   Final    URINE, CLEAN CATCH Performed at [facility], [place] [address], [place], [place] [address]    Special Requests   Final    NONE Performed at [facility], [place] [address] [place], [place] [address]    Culture >=100,000 COLONIES/mL STAPHYLOCOCCUS AUREUS (A)  Final   Report Status 05/27/2022 FINAL  Final   Organism ID, Bacteria STAPHYLOCOCCUS AUREUS (A)  Final       Susceptibility   Staphylococcus aureus - MIC*    CIPROFLOXACIN <=0.5 SENSITIVE Sensitive     GENTAMICIN <=0.5 SENSITIVE Sensitive     NITROFURANTOIN <=16 SENSITIVE Sensitive     OXACILLIN <=0.25 SENSITIVE Sensitive     TETRACYCLINE <=1 SENSITIVE Sensitive     VANCOMYCIN <=0.5 SENSITIVE Sensitive     TRIMETH/SULFA <=10 SENSITIVE Sensitive     CLINDAMYCIN <=  0.25 SENSITIVE Sensitive     RIFAMPIN <=0.5 SENSITIVE Sensitive     Inducible Clindamycin NEGATIVE Sensitive     * >=100,000 COLONIES/mL STAPHYLOCOCCUS AUREUS  Culture, blood (Routine X 2) w Reflex to ID Panel     Status: None (Preliminary result)   Collection Time: 05/25/22  2:25 PM   Specimen: BLOOD  Result Value Ref Range Status   Specimen Description   Final    BLOOD BLOOD RIGHT ARM BLOOD RIGHT HAND Performed at [place] [address], [place], [place] [address]    Special Requests   Final    BOTTLES DRAWN AEROBIC AND ANAEROBIC Blood Culture adequate volume Performed at [place] [address], [place], [place] [address]    Culture   Final    NO GROWTH 2 DAYS Performed at [place] [address], [place], [place] [address]    Report Status PENDING  Incomplete  Culture, blood (Routine X 2) w Reflex to ID Panel     Status: Abnormal (Preliminary result)   Collection Time: 05/25/22  2:26 PM   Specimen: BLOOD  Result Value Ref Range Status   Specimen Description   Final    BLOOD BLOOD LEFT ARM BLOOD LEFT HAND Performed at [place] [address], [place], [place] [address]    Special Requests   Final    BOTTLES DRAWN AEROBIC AND ANAEROBIC Blood Culture adequate volume Performed at [place] [address] [place], [place] [address]    Culture  Setup Time   Final    GRAM POSITIVE COCCI IN CLUSTERS IN BOTH AEROBIC AND ANAEROBIC BOTTLES CRITICAL RESULT CALLED TO, READ BACK BY AND VERIFIED WITH:  C/ PHARMD J. GADHIA  05/26/22 0950 A. LAFRANCE    Culture (A)  Final    STAPHYLOCOCCUS AUREUS SUSCEPTIBILITIES TO FOLLOW Performed at [facility], [place] [address], [place], [place] [address]    Report Status PENDING  Incomplete  Blood Culture ID Panel (Reflexed)     Status: Abnormal   Collection Time: 05/25/22  2:26 PM  Result Value Ref Range Status   Enterococcus faecalis NOT DETECTED NOT DETECTED Final   Enterococcus Faecium NOT DETECTED NOT DETECTED Final   Listeria monocytogenes NOT DETECTED NOT DETECTED Final   Staphylococcus species DETECTED (A) NOT DETECTED Final    Comment: CRITICAL RESULT CALLED TO, READ BACK BY AND VERIFIED WITH:  C/ PHARMD J. GADHIA 05/26/22 0950 A. LAFRANCE    Staphylococcus aureus (BCID) DETECTED (A) NOT DETECTED Final    Comment: CRITICAL RESULT CALLED TO, READ BACK BY AND VERIFIED WITH:  C/ PHARMD J. GADHIA 05/26/22 0950 A. LAFRANCE    Staphylococcus epidermidis NOT DETECTED NOT DETECTED Final   Staphylococcus lugdunensis NOT DETECTED NOT DETECTED Final   Streptococcus species NOT DETECTED NOT DETECTED Final   Streptococcus agalactiae NOT DETECTED NOT DETECTED Final   Streptococcus pneumoniae NOT DETECTED NOT DETECTED Final   Streptococcus pyogenes NOT DETECTED NOT DETECTED Final   A.calcoaceticus-baumannii NOT DETECTED NOT DETECTED Final   Bacteroides fragilis NOT DETECTED NOT DETECTED Final   Enterobacterales NOT DETECTED NOT DETECTED Final   Enterobacter cloacae complex NOT DETECTED NOT DETECTED Final   Escherichia coli NOT DETECTED NOT DETECTED Final   Klebsiella aerogenes NOT DETECTED NOT DETECTED Final   Klebsiella oxytoca NOT DETECTED NOT DETECTED Final   Klebsiella pneumoniae NOT DETECTED NOT DETECTED Final   Proteus species NOT DETECTED NOT DETECTED Final   Salmonella  species NOT DETECTED NOT DETECTED Final   Serratia marcescens NOT DETECTED NOT DETECTED Final   Haemophilus influenzae NOT DETECTED NOT DETECTED Final   Neisseria meningitidis NOT  DETECTED NOT DETECTED Final   Pseudomonas aeruginosa NOT DETECTED NOT DETECTED Final   Stenotrophomonas maltophilia NOT DETECTED NOT DETECTED Final   Candida albicans NOT DETECTED NOT DETECTED Final   Candida auris NOT DETECTED NOT DETECTED Final   Candida glabrata NOT DETECTED NOT DETECTED Final   Candida krusei NOT DETECTED NOT DETECTED Final   Candida parapsilosis NOT DETECTED NOT DETECTED Final   Candida tropicalis NOT DETECTED NOT DETECTED Final   Cryptococcus neoformans/gattii NOT DETECTED NOT DETECTED Final   Meth resistant mecA/C and MREJ NOT DETECTED NOT DETECTED Final    Comment: Performed at [facility], [place] [address], [place], [place] [address]  Resp panel by RT-PCR (RSV, Flu A&B, Covid) Anterior Nasal Swab     Status: None   Collection Time: 05/25/22  8:17 PM   Specimen: Anterior Nasal Swab  Result Value Ref Range Status   SARS Coronavirus 2 by RT PCR NEGATIVE NEGATIVE Final    Comment: (NOTE) SARS-CoV-2 target nucleic acids are NOT DETECTED.  The SARS-CoV-2 RNA is generally detectable in upper respiratory specimens during the acute phase of infection. The lowest concentration of SARS-CoV-2 viral copies this assay can detect is 138 copies/mL. A negative result does not preclude SARS-Cov-2 infection and should not be used as the sole basis for treatment or other patient management decisions. A negative result may occur with  improper specimen collection/handling, submission of specimen other than nasopharyngeal swab, presence of viral mutation(s) within the areas targeted by this assay, and inadequate number of viral copies(<138 copies/mL). A negative result must be combined with clinical observations, patient history, and epidemiological information. The expected result is Negative.  Fact Sheet for Patients:  EntrepreneurPulse.com.au  Fact Sheet for Healthcare Providers:  IncredibleEmployment.be  This test is no t yet  approved or cleared by the [place] FDA and  has been authorized for detection and/or diagnosis of SARS-CoV-2 by FDA under an Emergency Use Authorization (EUA). This EUA will remain  in effect (meaning this test can be used) for the duration of the COVID-19 declaration under Section 564(b)(1) of the Act, 21 U.S.C.section 360bbb-3(b)(1), unless the authorization is terminated  or revoked sooner.       Influenza A by PCR NEGATIVE NEGATIVE Final   Influenza B by PCR NEGATIVE NEGATIVE Final    Comment: (NOTE) The Xpert Xpress SARS-CoV-2/FLU/RSV plus assay is intended as an aid in the diagnosis of influenza from Nasopharyngeal swab specimens and should not be used as a sole basis for treatment. Nasal washings and aspirates are unacceptable for Xpert Xpress SARS-CoV-2/FLU/RSV testing.  Fact Sheet for Patients: EntrepreneurPulse.com.au  Fact Sheet for Healthcare Providers: IncredibleEmployment.be  This test is not yet approved or cleared by the [place] FDA and has been authorized for detection and/or diagnosis of SARS-CoV-2 by FDA under an Emergency Use Authorization (EUA). This EUA will remain in effect (meaning this test can be used) for the duration of the COVID-19 declaration under Section 564(b)(1) of the Act, 21 U.S.C. section 360bbb-3(b)(1), unless the authorization is terminated or revoked.     Resp Syncytial Virus by PCR NEGATIVE NEGATIVE Final    Comment: (NOTE) Fact Sheet for Patients: EntrepreneurPulse.com.au  Fact Sheet for Healthcare Providers: IncredibleEmployment.be  This test is not yet approved or cleared by the [place] and has been authorized for  detection and/or diagnosis of SARS-CoV-2 by FDA under an Emergency Use Authorization (EUA). This EUA will remain in effect (meaning this test can be used) for the duration of the COVID-19 declaration under Section 564(b)(1) of  the Act, 21 U.S.C. section 360bbb-3(b)(1), unless the authorization is terminated or revoked.  Performed at [facility], [place] [address], [place], [place] [address]          Radiology Studies: DG CHEST PORT 1 VIEW  Result Date: 05/25/2022 CLINICAL DATA:  Hypoxia EXAM: PORTABLE CHEST 1 VIEW COMPARISON:  X-ray 05/22/2022 FINDINGS: Underinflation with enlarged cardiopericardial silhouette and new vascular congestion. No pneumothorax or effusion. No consolidation. Overlapping cardiac leads. Film is rotated. IMPRESSION: Enlarged cardiopericardial silhouette with new vascular congestion. Underinflated x-ray Electronically Signed   By: Jill Side M.D.   On: 05/25/2022 14:06        Scheduled Meds:  amLODipine  10 mg Oral Daily   chlordiazePOXIDE  25 mg Oral Daily   Chlorhexidine Gluconate Cloth  6 each Topical Daily   diclofenac Sodium  2 g Topical QID   folic acid  1 mg Oral Daily   furosemide  20 mg Oral Daily   insulin aspart  0-15 Units Subcutaneous TID WC   insulin aspart  0-5 Units Subcutaneous QHS   lactulose  30 g Oral Daily   mirtazapine  15 mg Oral QHS   multivitamin with minerals  1 tablet Oral Daily   pantoprazole (PROTONIX) IV  40 mg Intravenous Q12H   potassium chloride  40 mEq Oral Q4H   spironolactone  25 mg Oral Daily   tamsulosin  0.4 mg Oral QPC supper   thiamine  100 mg Oral Daily   Or   thiamine  100 mg Intravenous Daily   Continuous Infusions:   ceFAZolin (ANCEF) IV Stopped (05/27/22 0557)     LOS: 4 days    Time spent: 50 minutes    Irine Seal, MD [facility]   To contact the attending provider between 7A-7P or the covering provider during after hours 7P-7A, please log into the web site [email or web address] and access using universal [place] password for that web site. If you do not have the password, please call the hospital operator.  05/27/2022, 11:17 AM

## 2022-05-28 DIAGNOSIS — D61818 Other pancytopenia: Secondary | ICD-10-CM | POA: Diagnosis not present

## 2022-05-28 LAB — CBC WITH DIFFERENTIAL/PLATELET
Abs Immature Granulocytes: 0.02 10*3/uL (ref 0.00–0.07)
Basophils Absolute: 0 10*3/uL (ref 0.0–0.1)
Basophils Relative: 1 %
Eosinophils Absolute: 0 10*3/uL (ref 0.0–0.5)
Eosinophils Relative: 2 %
HCT: 30.2 % — ABNORMAL LOW (ref 39.0–52.0)
Hemoglobin: 9.9 g/dL — ABNORMAL LOW (ref 13.0–17.0)
Immature Granulocytes: 2 %
Lymphocytes Relative: 31 %
Lymphs Abs: 0.4 10*3/uL — ABNORMAL LOW (ref 0.7–4.0)
MCH: 27.4 pg (ref 26.0–34.0)
MCHC: 32.8 g/dL (ref 30.0–36.0)
MCV: 83.7 fL (ref 80.0–100.0)
Monocytes Absolute: 0.4 10*3/uL (ref 0.1–1.0)
Monocytes Relative: 30 %
Neutro Abs: 0.5 10*3/uL — ABNORMAL LOW (ref 1.7–7.7)
Neutrophils Relative %: 34 %
Platelets: 24 10*3/uL — CL (ref 150–400)
RBC: 3.61 MIL/uL — ABNORMAL LOW (ref 4.22–5.81)
RDW: 20 % — ABNORMAL HIGH (ref 11.5–15.5)
WBC: 1.3 10*3/uL — CL (ref 4.0–10.5)
nRBC: 0 % (ref 0.0–0.2)

## 2022-05-28 LAB — PREPARE PLATELET PHERESIS: Unit division: 0

## 2022-05-28 LAB — GLUCOSE, CAPILLARY
Glucose-Capillary: 160 mg/dL — ABNORMAL HIGH (ref 70–99)
Glucose-Capillary: 264 mg/dL — ABNORMAL HIGH (ref 70–99)
Glucose-Capillary: 143 mg/dL — ABNORMAL HIGH (ref 70–99)
Glucose-Capillary: 227 mg/dL — ABNORMAL HIGH (ref 70–99)

## 2022-05-28 LAB — MAGNESIUM: Magnesium: 1.7 mg/dL (ref 1.7–2.4)

## 2022-05-28 LAB — COMPREHENSIVE METABOLIC PANEL
ALT: 46 U/L — ABNORMAL HIGH (ref 0–44)
AST: 63 U/L — ABNORMAL HIGH (ref 15–41)
Albumin: 3.2 g/dL — ABNORMAL LOW (ref 3.5–5.0)
Alkaline Phosphatase: 74 U/L (ref 38–126)
Anion gap: 5 (ref 5–15)
BUN: 7 mg/dL (ref 6–20)
CO2: 28 mmol/L (ref 22–32)
Calcium: 8 mg/dL — ABNORMAL LOW (ref 8.9–10.3)
Chloride: 100 mmol/L (ref 98–111)
Creatinine, Ser: 0.59 mg/dL — ABNORMAL LOW (ref 0.61–1.24)
GFR, Estimated: >60 mL/min (ref >60)
Glucose, Bld: 181 mg/dL — ABNORMAL HIGH (ref 70–99)
Potassium: 3.2 mmol/L — ABNORMAL LOW (ref 3.5–5.1)
Sodium: 133 mmol/L — ABNORMAL LOW (ref 135–145)
Total Bilirubin: 2.7 mg/dL — ABNORMAL HIGH (ref 0.3–1.2)
Total Protein: 6.2 g/dL — ABNORMAL LOW (ref 6.5–8.1)

## 2022-05-28 LAB — BPAM PLATELET PHERESIS
Blood Product Expiration Date: 202403062359
ISSUE DATE / TIME: 202403051312
Unit Type and Rh: 6200

## 2022-05-28 LAB — PHOSPHORUS: Phosphorus: 2.7 mg/dL (ref 2.5–4.6)

## 2022-05-28 LAB — CULTURE, BLOOD (ROUTINE X 2): Special Requests: ADEQUATE

## 2022-05-28 MED ORDER — SODIUM CHLORIDE 0.9 % IV SOLN: INTRAVENOUS | Status: DC | PRN

## 2022-05-28 MED ORDER — POTASSIUM CHLORIDE CRYS ER 20 MEQ PO TBCR
40.0000 meq | EXTENDED_RELEASE_TABLET | Freq: Once | ORAL | Status: AC
  Administered 2022-05-28: 40 meq via ORAL
  Filled 2022-05-28: qty 2

## 2022-05-28 MED ORDER — LORAZEPAM 2 MG/ML IJ SOLN: 1.0000 mg | INTRAMUSCULAR | Status: AC

## 2022-05-28 MED ORDER — LORAZEPAM 1 MG PO TABS
1.0000 mg | ORAL_TABLET | ORAL | Status: AC
  Administered 2022-05-28: 1 mg via ORAL
  Filled 2022-05-28: qty 1

## 2022-05-28 NOTE — Progress Notes (Signed)
[facility] for Infectious Disease    Date of Admission:  05/22/2022   Total days of antibiotics 7   ID: [name] is a 52 y.o. male with   Principal Problem:   Other pancytopenia ([place]) Active Problems:   Cirrhosis of liver ([place])   Hypertension   Alcohol withdrawal with complication with inpatient treatment ([place])   Hypokalemia   Hypomagnesemia   DM (diabetes mellitus), type 2 ([facility])   Liver mass   Benign prostatic hyperplasia (BPH) with straining on urination   History of eye trauma   Hematoma of right flank   Hypophosphatemia   Hyponatremia   Hypoxia   UTI (urinary tract infection)   MSSA bacteremia    Subjective: Afebrile. Underwent CT of right arm and found septic thrombophlebitis in [facility] vein  Imaging = 4.8cm liver lesion incidentally found, concern for [facility]  Medications:   amLODipine  10 mg Oral Daily   chlordiazePOXIDE  25 mg Oral Daily   Chlorhexidine Gluconate Cloth  6 each Topical Daily   diclofenac Sodium  2 g Topical QID   folic acid  1 mg Oral Daily   furosemide  20 mg Oral Daily   insulin aspart  0-15 Units Subcutaneous TID WC   insulin aspart  0-5 Units Subcutaneous QHS   lactulose  30 g Oral Daily   mirtazapine  15 mg Oral QHS   multivitamin with minerals  1 tablet Oral Daily   pantoprazole (PROTONIX) IV  40 mg Intravenous Q12H   potassium chloride  40 mEq Oral Once   spironolactone  25 mg Oral Daily   tamsulosin  0.4 mg Oral QPC supper   thiamine  100 mg Oral Daily   Or   thiamine  100 mg Intravenous Daily    Objective: Vital signs in last 24 hours: Temp:  [98.2 F (36.8 C)-98.9 F (37.2 C)] 98.9 F (37.2 C) (03/06 1200) Pulse Rate:  [39-109] 99 (03/06 1300) Resp:  [11-25] 19 (03/06 1300) BP: (87-155)/(46-121) 132/81 (03/06 1200) SpO2:  [84 %-99 %] 96 % (03/06 1300)  Physical Exam  Constitutional: He is oriented to person, place, and time. He appears well-developed and well-nourished. No distress.  HENT:  Mouth/Throat:  Oropharynx is clear and moist. No oropharyngeal exudate.  Cardiovascular: Normal rate, regular rhythm and normal heart sounds. Exam reveals no gallop and no friction rub.  No murmur heard.  Pulmonary/Chest: Effort normal and breath sounds normal. No respiratory distress. He has no wheezes.  Abdominal: Soft. Bowel sounds are normal. He exhibits no distension. There is no tenderness.  Lymphadenopathy:  He has no cervical adenopathy.  Neurological: He is alert and oriented to person, place, and time.  Skin: Skin is warm and dry. Scattered echymosis  Psychiatric: He has a normal mood and affect. His behavior is normal.    Lab Results Recent Labs    05/27/22 0315 05/27/22 1817 05/28/22 0724  WBC 2.2* 2.3* 1.3*  HGB 10.5* 10.6* 9.9*  HCT 32.1* 32.7* 30.2*  NA 129*  --  133*  K 2.9*  --  3.2*  CL 94*  --  100  CO2 30  --  28  BUN 8  --  7  CREATININE 0.61  --  0.59*   Liver Panel Recent Labs    05/27/22 0315 05/28/22 0724  PROT 6.4* 6.2*  ALBUMIN 3.4* 3.2*  AST 87* 63*  ALT 61* 46*  ALKPHOS 70 74  BILITOT 2.8* 2.7*    Microbiology: Blood cx  3/6 pending 3/3 blood cx MSSA Staphylococcus aureus      MIC    CIPROFLOXACIN <=0.5 SENSI... Sensitive    CLINDAMYCIN <=0.25 SENS... Sensitive    ERYTHROMYCIN <=0.25 SENS... Sensitive    GENTAMICIN <=0.5 SENSI... Sensitive    Inducible Clindamycin NEGATIVE Sensitive    OXACILLIN 0.5 SENSITIVE Sensitive    RIFAMPIN <=0.5 SENSI... Sensitive    TETRACYCLINE <=1 SENSITIVE Sensitive    TRIMETH/SULFA <=10 SENSIT... Sensitive    VANCOMYCIN 1 SENSITIVE Sensitive    Studies/Results: CT FOREARM RIGHT W CONTRAST  Addendum Date: 05/28/2022   ADDENDUM REPORT: 05/28/2022 03:12 ADDENDUM: There is a 4.8 cm mass in the right lobe of the liver which has been seen on prior imaging of the abdomen and pelvis most recently 05/22/2022 and was biopsied by ultrasound guidance on 12/27/2021. Correlate clinically with prior biopsy results. This was  previously considered suspicious for hepatocellular carcinoma. The liver is cirrhotic and steatotic. Electronically Signed   By: Telford Nab M.D.   On: 05/28/2022 03:12   Result Date: 05/28/2022 CLINICAL DATA:  Right upper extremity infection, possibly from IV, originating in the antecubital area. EXAM: CT OF THE UPPER RIGHT EXTREMITY WITH CONTRAST (CT RIGHT HUMERUS, CT RIGHT FOREARM COMBINED REPORT) TECHNIQUE: Multidetector CT imaging of the upper right extremity was performed from the shoulder through the wrist according to the standard protocol following intravenous contrast administration, in soft tissue and bone window settings with multiplanar reformatting. RADIATION DOSE REDUCTION: This exam was performed according to the departmental dose-optimization program which includes automated exposure control, adjustment of the mA and/or kV according to patient size and/or use of iterative reconstruction technique. CONTRAST:  149m OMNIPAQUE IOHEXOL 300 MG/ML  SOLN COMPARISON:  None Available. FINDINGS: Bones/Joint/Cartilage There is normal bone mineralization without evidence of acute fractures, primarily pathologic bone lesions or acute osteomyelitis from the right shoulder through the wrist. A nondisplaced chip fracture is noted of lateral edge of the coronoid process of the ulna, but is believed chronic due to lack of a joint effusion. There are minor subcortical cystic changes scattered along the humeral rotator cuff insertion, but no findings of chronic rotator cuff arthropathy or through and through rotator cuff tears. There is narrowing and mild spurring at the [facility]. There is trace spurring of the inferior glenohumeral joint and the medial aspect of the trochleoulnar joint. There is mild narrowing of the triscaphe and first CMC joints. There are no findings of erosive arthropathy throughout. Enthesopathic spurring of the lateral humeral epicondyle is incidentally seen. Ligaments Suboptimally assessed  by CT. Muscles and Tendons There is normal muscle bulk throughout the imaged right upper extremity. No intramuscular masses or fluid collections are seen. Area tendons are not well evaluated with this technique but are grossly intact as far as visualized. Soft tissues The contrast bolus phase in which the right upper extremity was imaged was predominantly the venous phase. Large arteries of the right upper extremity opacify well at least the elbow but are not well seen in the forearm and wrist, probably due to the bolus phase. There is a short-segment of high-grade narrowing due to thrombosis in the antecubital vein at and just above the level of the elbow crease, surrounded by subcutaneous edema which may indicate thrombophlebitis/cellulitis. There is no underlying abscess. Remainder of the right upper extremity veins opacify well. There is no evidence of soft tissue gas or visible deep soft tissue abnormality which would be suspicious for acute necrotizing fasciitis, but lack of CT findings of this  process should not delay surgery in the presence of clinical suspicion. IMPRESSION: 1. Short-segment of high-grade narrowing due to thrombosis, in the antecubital vein at and just above the level of the elbow crease, surrounded by subcutaneous edema which may indicate thrombophlebitis/cellulitis. 2. No evidence of soft tissue gas or deep soft tissue abnormality which would be suspicious for acute necrotizing fasciitis, but lack of CT findings of this process should not delay surgery in the presence of clinical suspicion. 3. No evidence of acute fractures, primarily pathologic bone lesions or acute osteomyelitis from the right shoulder through the wrist. 4. Degenerative changes described above. 5. Nondisplaced chip fracture of the lateral edge of the coronoid process of the ulna, believed chronic due to the lack of a joint effusion. 6. Enthesopathic spurring of the lateral humeral epicondyle. Electronically Signed: By:  Telford Nab M.D. On: 05/27/2022 21:32   CT HUMERUS RIGHT W CONTRAST  Addendum Date: 05/28/2022   ADDENDUM REPORT: 05/28/2022 03:12 ADDENDUM: There is a 4.8 cm mass in the right lobe of the liver which has been seen on prior imaging of the abdomen and pelvis most recently 05/22/2022 and was biopsied by ultrasound guidance on 12/27/2021. Correlate clinically with prior biopsy results. This was previously considered suspicious for hepatocellular carcinoma. The liver is cirrhotic and steatotic. Electronically Signed   By: Telford Nab M.D.   On: 05/28/2022 03:12   Result Date: 05/28/2022 CLINICAL DATA:  Right upper extremity infection, possibly from IV, originating in the antecubital area. EXAM: CT OF THE UPPER RIGHT EXTREMITY WITH CONTRAST (CT RIGHT HUMERUS, CT RIGHT FOREARM COMBINED REPORT) TECHNIQUE: Multidetector CT imaging of the upper right extremity was performed from the shoulder through the wrist according to the standard protocol following intravenous contrast administration, in soft tissue and bone window settings with multiplanar reformatting. RADIATION DOSE REDUCTION: This exam was performed according to the departmental dose-optimization program which includes automated exposure control, adjustment of the mA and/or kV according to patient size and/or use of iterative reconstruction technique. CONTRAST:  136m OMNIPAQUE IOHEXOL 300 MG/ML  SOLN COMPARISON:  None Available. FINDINGS: Bones/Joint/Cartilage There is normal bone mineralization without evidence of acute fractures, primarily pathologic bone lesions or acute osteomyelitis from the right shoulder through the wrist. A nondisplaced chip fracture is noted of lateral edge of the coronoid process of the ulna, but is believed chronic due to lack of a joint effusion. There are minor subcortical cystic changes scattered along the humeral rotator cuff insertion, but no findings of chronic rotator cuff arthropathy or through and through rotator cuff  tears. There is narrowing and mild spurring at the [facility]. There is trace spurring of the inferior glenohumeral joint and the medial aspect of the trochleoulnar joint. There is mild narrowing of the triscaphe and first CMC joints. There are no findings of erosive arthropathy throughout. Enthesopathic spurring of the lateral humeral epicondyle is incidentally seen. Ligaments Suboptimally assessed by CT. Muscles and Tendons There is normal muscle bulk throughout the imaged right upper extremity. No intramuscular masses or fluid collections are seen. Area tendons are not well evaluated with this technique but are grossly intact as far as visualized. Soft tissues The contrast bolus phase in which the right upper extremity was imaged was predominantly the venous phase. Large arteries of the right upper extremity opacify well at least the elbow but are not well seen in the forearm and wrist, probably due to the bolus phase. There is a short-segment of high-grade narrowing due to thrombosis in the antecubital  vein at and just above the level of the elbow crease, surrounded by subcutaneous edema which may indicate thrombophlebitis/cellulitis. There is no underlying abscess. Remainder of the right upper extremity veins opacify well. There is no evidence of soft tissue gas or visible deep soft tissue abnormality which would be suspicious for acute necrotizing fasciitis, but lack of CT findings of this process should not delay surgery in the presence of clinical suspicion. IMPRESSION: 1. Short-segment of high-grade narrowing due to thrombosis, in the antecubital vein at and just above the level of the elbow crease, surrounded by subcutaneous edema which may indicate thrombophlebitis/cellulitis. 2. No evidence of soft tissue gas or deep soft tissue abnormality which would be suspicious for acute necrotizing fasciitis, but lack of CT findings of this process should not delay surgery in the presence of clinical suspicion. 3. No  evidence of acute fractures, primarily pathologic bone lesions or acute osteomyelitis from the right shoulder through the wrist. 4. Degenerative changes described above. 5. Nondisplaced chip fracture of the lateral edge of the coronoid process of the ulna, believed chronic due to the lack of a joint effusion. 6. Enthesopathic spurring of the lateral humeral epicondyle. Electronically Signed: By: Telford Nab M.D. On: 05/27/2022 21:32   ECHOCARDIOGRAM COMPLETE  Result Date: 05/27/2022    ECHOCARDIOGRAM REPORT   Patient Name:   GABRIELE GOWER Date of Exam: 05/27/2022 Medical Rec #:  [id number]            Height:       67.0 in Accession #:    [id number]           Weight:       147.5 lb Date of Birth:  [birth year]            BSA:          1.777 m Patient Age:    29 years             BP:           126/72 mmHg Patient Gender: M                    HR:           100 bpm. Exam Location:  Inpatient Procedure: 2D Echo Indications:    bacteremia  History:        Patient has no prior history of Echocardiogram examinations.                 Risk Factors:Diabetes and Hypertension.  Sonographer:    Harvie Junior Referring Phys: Circle  1. Left ventricular ejection fraction, by estimation, is 60 to 65%. The left ventricle has normal function. The left ventricle has no regional wall motion abnormalities. Left ventricular diastolic parameters were normal.  2. Right ventricular systolic function is normal. The right ventricular size is normal. There is normal pulmonary artery systolic pressure. The estimated right ventricular systolic pressure is 123456 mmHg.  3. The mitral valve is grossly normal. No evidence of mitral valve regurgitation. No evidence of mitral stenosis.  4. The aortic valve is tricuspid. Aortic valve regurgitation is not visualized. No aortic stenosis is present.  5. The inferior vena cava is normal in size with greater than 50% respiratory variability, suggesting right atrial pressure  of 3 mmHg. Conclusion(s)/Recommendation(s): No evidence of valvular vegetations on this transthoracic echocardiogram. Consider a transesophageal echocardiogram to exclude infective endocarditis if clinically indicated. FINDINGS  Left Ventricle: Left ventricular ejection fraction, by estimation,  is 60 to 65%. The left ventricle has normal function. The left ventricle has no regional wall motion abnormalities. The left ventricular internal cavity size was normal in size. There is  no left ventricular hypertrophy. Left ventricular diastolic parameters were normal. Right Ventricle: The right ventricular size is normal. No increase in right ventricular wall thickness. Right ventricular systolic function is normal. There is normal pulmonary artery systolic pressure. The tricuspid regurgitant velocity is 2.13 m/s, and  with an assumed right atrial pressure of 3 mmHg, the estimated right ventricular systolic pressure is 123456 mmHg. Left Atrium: Left atrial size was normal in size. Right Atrium: Right atrial size was normal in size. Pericardium: There is no evidence of pericardial effusion. Mitral Valve: The mitral valve is grossly normal. No evidence of mitral valve regurgitation. No evidence of mitral valve stenosis. Tricuspid Valve: The tricuspid valve is grossly normal. Tricuspid valve regurgitation is not demonstrated. No evidence of tricuspid stenosis. Aortic Valve: The aortic valve is tricuspid. Aortic valve regurgitation is not visualized. No aortic stenosis is present. Aortic valve mean gradient measures 8.0 mmHg. Aortic valve peak gradient measures 15.1 mmHg. Aortic valve area, by VTI measures 1.73  cm. Pulmonic Valve: The pulmonic valve was grossly normal. Pulmonic valve regurgitation is trivial. No evidence of pulmonic stenosis. Aorta: The aortic root is normal in size and structure. Venous: The inferior vena cava is normal in size with greater than 50% respiratory variability, suggesting right atrial pressure of  3 mmHg. IAS/Shunts: The atrial septum is grossly normal.  LEFT VENTRICLE PLAX 2D LVIDd:         5.10 cm      Diastology LVIDs:         3.20 cm      LV e' medial:    7.51 cm/s LV PW:         0.90 cm      LV E/e' medial:  8.7 LV IVS:        0.90 cm      LV e' lateral:   10.30 cm/s LVOT diam:     2.00 cm      LV E/e' lateral: 6.4 LV SV:         50 LV SV Index:   28 LVOT Area:     3.14 cm  LV Volumes (MOD) LV vol d, MOD A2C: 96.7 ml LV vol d, MOD A4C: 152.0 ml LV vol s, MOD A2C: 34.0 ml LV vol s, MOD A4C: 47.4 ml LV SV MOD A2C:     62.7 ml LV SV MOD A4C:     152.0 ml LV SV MOD BP:      80.0 ml RIGHT VENTRICLE RV Basal diam:  3.50 cm RV Mid diam:    2.80 cm LEFT ATRIUM             Index        RIGHT ATRIUM           Index LA diam:        3.80 cm 2.14 cm/m   RA Area:     13.70 cm LA Vol (A2C):   51.4 ml 28.93 ml/m  RA Volume:   30.60 ml  17.22 ml/m LA Vol (A4C):   41.9 ml 23.58 ml/m LA Biplane Vol: 47.1 ml 26.51 ml/m  AORTIC VALVE                     PULMONIC VALVE AV Area (Vmax):    1.79 cm  PV Vmax:          1.19 m/s AV Area (Vmean):   1.70 cm      PV Peak grad:     5.7 mmHg AV Area (VTI):     1.73 cm      PR End Diast Vel: 4.75 msec AV Vmax:           194.50 cm/s AV Vmean:          129.000 cm/s AV VTI:            0.288 m AV Peak Grad:      15.1 mmHg AV Mean Grad:      8.0 mmHg LVOT Vmax:         111.00 cm/s LVOT Vmean:        69.800 cm/s LVOT VTI:          0.159 m LVOT/AV VTI ratio: 0.55  AORTA Ao Root diam: 3.60 cm MITRAL VALVE               TRICUSPID VALVE MV Area (PHT): 5.09 cm    TR Peak grad:   18.1 mmHg MV Decel Time: 149 msec    TR Vmax:        213.00 cm/s MV E velocity: 65.65 cm/s MV A velocity: 75.15 cm/s  SHUNTS MV E/A ratio:  0.87        Systemic VTI:  0.16 m                            Systemic Diam: 2.00 cm Eleonore Chiquito MD Electronically signed by Eleonore Chiquito MD Signature Date/Time: 05/27/2022/12:36:31 PM    Final      Assessment/Plan: MSSA bacteremia = suspect it maybe either on admit  vs. Hospital acquired since has purulence about his right AC PIV site. TTE shows no vegetation. His thrombocytopenia precludes him from getting TEE. Would treat as complicated bacteremia for 4 wk, at least 2 wks should be IV abtx.  Cirrhosis with pancytopenia = continue to monitor and keep plt >20 in setting of bacteremia.  Liver lesion = concerning for hepatocellular CA. Recommend to get mri of liver. Previous bx showed necrotic cells associated with tumor?  May ask GI what would be feasible for treatment?  [facility] for Infectious Diseases Pager: [phone]  05/28/2022, 2:29 PM

## 2022-05-28 NOTE — Progress Notes (Signed)
Progress Note   Patient: [name] [id number] DOB: [birth year] DOA: 05/22/2022     5 DOS: the patient was seen and examined on 05/28/2022 at 8:59 AM      Brief hospital course: [name] is a 52 y.o. 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  In the ER, found to have worse pancytopenia.    Subsequently found to have MSSA bacteremia.     Assessment and Plan: * Other pancytopenia (HCC) Anemia, normocytic Leukopenia, neutropenia Severe chronic thrombocytopenia Chronic.  Due to alcohol, probably worse in setting of bacteremia, alcohol poisoning.  Heme consulted, transfused 2 units of platelets 05/23/2022, 1u 3/2, 1u 3/3.  Since then, no clinical bleeding, Plts >20.   ANC down to 500 today. - Continue PPI - Transfuse for platelet count < 20K due to bacteremia - Consult Hematology, appreciate expertise    MSSA bacteremia ID speculate PIV related vs. Community aquired.  TTE unremarkable.  Unable to obtain TEE in setting of thrombocytopenia  CT of the humerus showed some superficial thrombophlebitis, no evidence of myositis. - Continue Ancef - Consult ID, appreciate cares - Follow repeat cultures, obtained today    - Plan for 2 weeks IV antibiotics, 4 weeks antibiotics total   Alcohol withdrawal with complication with inpatient treatment ([place]) CIWA low - Continue thiamine and folate - Continue Librium taper    Liver mass Stable 4.4cm liver mass.  Biopsied in Oct 2023, necrotic cells.  Repeat biopsy precluded by platelets.   - Will consider repeat MRI liver prior to discharge  - Needs GI and Heme/Onc follow up  UTI (urinary tract infection) Seeded by staph bacteremia - See above  Hypoxia Respiratory failure ruled out.  Off of oxygen now.  COVID and flu negative.   Hyponatremia Hypervolemic - Continue diuretics and trend  Hypophosphatemia Repleted  Hematoma of right flank Unchanged  History of  eye trauma Hx right eye injury March 2023, with dilated pupil and poor vision since then  Benign prostatic hyperplasia (BPH) with straining on urination - Continue Flomax  DM (diabetes mellitus), type 2 (HCC) Glucose is improved - Continue sliding scale corrections  Hypomagnesemia Supplemented and resolved  Hypokalemia - Supplement potassium  Hypertension Blood pressure soft - Hold amlodipine - Continue furosemide, spironolactone  Cirrhosis of liver ([name]) Actively drinking - Continue furosemide, spironolactone - Continue lactulose            Subjective: Patient still has pain in his arms, no fever, no confusion, mild alcohol withdrawal symptoms.  He had a CT yesterday afternoon that was unremarkable except for some superficial thrombophlebitis, likely the source of his infection.     Physical Exam: BP 132/81 (BP Location: Left Arm)   Pulse 99   Temp 98.9 F (37.2 C) (Oral)   Resp 19   Ht '5\' 7"'$  (1.702 m)   Wt 66.9 kg   SpO2 96%   BMI 23.10 kg/m   Thin adult male, sitting up in bed, interactive, appears uncomfortable Tachycardic, regular, no murmurs, no peripheral edema, no JVD Respiratory effort appears normal, lungs clear without rales or wheezes Abdomen soft without tenderness palpation but no distention or ascites The arm on the left is bruised, relatively unremarkable, left arm has not no evidence of cellulitis, some purulence around his recent antecubital PIV site Attention normal, affect blunted by pain, judgment Syprine normal, no tremor, no confusion, moves upper extremities with generalized weakness with symmetric strength, speech fluent    Data Reviewed:  Infectious disease note reviewed Potassium slightly up, sodium stable White blood cell count down to 1.3, absolute neutrophil count 500, platelets on CBC slightly down to 24,000  Family Communication: None present    Disposition: Status is: Inpatient         Author: Edwin Dada, MD 05/28/2022 2:54 PM  For on call review [email or web address].

## 2022-05-28 NOTE — Plan of Care (Signed)
  Problem: Education: Goal: Knowledge of General Education information will improve Description: Including pain rating scale, medication(s)/side effects and non-pharmacologic comfort measures Outcome: Progressing   Problem: Health Behavior/Discharge Planning: Goal: Ability to manage health-related needs will improve Outcome: Progressing   Problem: Clinical Measurements: Goal: Ability to maintain clinical measurements within normal limits will improve Outcome: Progressing Goal: Will remain free from infection Outcome: Progressing Goal: Diagnostic test results will improve Outcome: Progressing Goal: Respiratory complications will improve Outcome: Progressing Goal: Cardiovascular complication will be avoided Outcome: Progressing   Problem: Activity: Goal: Risk for activity intolerance will decrease Outcome: Progressing   Problem: Nutrition: Goal: Adequate nutrition will be maintained Outcome: Progressing   Problem: Coping: Goal: Level of anxiety will decrease Outcome: Progressing   Problem: Elimination: Goal: Will not experience complications related to bowel motility Outcome: Progressing Goal: Will not experience complications related to urinary retention Outcome: Progressing   Problem: Pain Managment: Goal: General experience of comfort will improve Outcome: Progressing   Problem: Safety: Goal: Ability to remain free from injury will improve Outcome: Progressing   Problem: Skin Integrity: Goal: Risk for impaired skin integrity will decrease Outcome: Progressing   Problem: Education: Goal: Ability to describe self-care measures that may prevent or decrease complications (Diabetes Survival Skills Education) will improve Outcome: Progressing Goal: Individualized Educational Video(s) Outcome: Progressing   Problem: Coping: Goal: Ability to adjust to condition or change in health will improve Outcome: Progressing   Problem: Fluid Volume: Goal: Ability to  maintain a balanced intake and output will improve Outcome: Progressing   Problem: Health Behavior/Discharge Planning: Goal: Ability to identify and utilize available resources and services will improve Outcome: Progressing Goal: Ability to manage health-related needs will improve Outcome: Progressing   Problem: Metabolic: Goal: Ability to maintain appropriate glucose levels will improve Outcome: Progressing   Problem: Nutritional: Goal: Maintenance of adequate nutrition will improve Outcome: Progressing Goal: Progress toward achieving an optimal weight will improve Outcome: Progressing   Problem: Skin Integrity: Goal: Risk for impaired skin integrity will decrease Outcome: Progressing   Problem: Tissue Perfusion: Goal: Adequacy of tissue perfusion will improve Outcome: Progressing   Problem: Education: Goal: Knowledge of disease or condition will improve Outcome: Progressing Goal: Understanding of discharge needs will improve Outcome: Progressing   Problem: Health Behavior/Discharge Planning: Goal: Ability to identify changes in lifestyle to reduce recurrence of condition will improve Outcome: Progressing Goal: Identification of resources available to assist in meeting health care needs will improve Outcome: Progressing   Problem: Physical Regulation: Goal: Complications related to the disease process, condition or treatment will be avoided or minimized Outcome: Progressing   Problem: Safety: Goal: Ability to remain free from injury will improve Outcome: Progressing

## 2022-05-29 LAB — GLUCOSE, CAPILLARY
Glucose-Capillary: 190 mg/dL — ABNORMAL HIGH (ref 70–99)
Glucose-Capillary: 212 mg/dL — ABNORMAL HIGH (ref 70–99)
Glucose-Capillary: 220 mg/dL — ABNORMAL HIGH (ref 70–99)
Glucose-Capillary: 153 mg/dL — ABNORMAL HIGH (ref 70–99)
Glucose-Capillary: 259 mg/dL — ABNORMAL HIGH (ref 70–99)

## 2022-05-29 LAB — COMPREHENSIVE METABOLIC PANEL
ALT: 40 U/L (ref 0–44)
AST: 55 U/L — ABNORMAL HIGH (ref 15–41)
Albumin: 3.1 g/dL — ABNORMAL LOW (ref 3.5–5.0)
Alkaline Phosphatase: 70 U/L (ref 38–126)
Anion gap: 8 (ref 5–15)
BUN: 7 mg/dL (ref 6–20)
CO2: 27 mmol/L (ref 22–32)
Calcium: 8.2 mg/dL — ABNORMAL LOW (ref 8.9–10.3)
Chloride: 98 mmol/L (ref 98–111)
Creatinine, Ser: 0.6 mg/dL — ABNORMAL LOW (ref 0.61–1.24)
GFR, Estimated: >60 mL/min (ref >60)
Glucose, Bld: 191 mg/dL — ABNORMAL HIGH (ref 70–99)
Potassium: 3.4 mmol/L — ABNORMAL LOW (ref 3.5–5.1)
Sodium: 133 mmol/L — ABNORMAL LOW (ref 135–145)
Total Bilirubin: 2.5 mg/dL — ABNORMAL HIGH (ref 0.3–1.2)
Total Protein: 5.9 g/dL — ABNORMAL LOW (ref 6.5–8.1)

## 2022-05-29 LAB — CBC
HCT: 30.1 % — ABNORMAL LOW (ref 39.0–52.0)
Hemoglobin: 9.4 g/dL — ABNORMAL LOW (ref 13.0–17.0)
MCH: 26.8 pg (ref 26.0–34.0)
MCHC: 31.2 g/dL (ref 30.0–36.0)
MCV: 85.8 fL (ref 80.0–100.0)
Platelets: 28 10*3/uL — CL (ref 150–400)
RBC: 3.51 MIL/uL — ABNORMAL LOW (ref 4.22–5.81)
RDW: 20.5 % — ABNORMAL HIGH (ref 11.5–15.5)
WBC: 1.6 10*3/uL — ABNORMAL LOW (ref 4.0–10.5)
nRBC: 0 % (ref 0.0–0.2)

## 2022-05-29 MED ORDER — POTASSIUM CHLORIDE CRYS ER 20 MEQ PO TBCR
40.0000 meq | EXTENDED_RELEASE_TABLET | Freq: Once | ORAL | Status: AC
  Administered 2022-05-29: 40 meq via ORAL
  Filled 2022-05-29: qty 2

## 2022-05-29 MED ORDER — INSULIN GLARGINE-YFGN 100 UNIT/ML ~~LOC~~ SOLN
8.0000 [IU] | Freq: Every day | SUBCUTANEOUS | Status: DC
  Administered 2022-05-29 – 2022-05-31 (×3): 8 [IU] via SUBCUTANEOUS
  Filled 2022-05-29 (×3): qty 0.08

## 2022-05-29 NOTE — Progress Notes (Signed)
Physical Therapy Treatment Patient Details Name: [name] MRN: [id number] DOB: 1970-12-29 Today's Date: 05/29/2022   History of Present Illness Patient is a 52 year old male who presented on 2/29 with recurrent falls in setting of ongoing drinking. Patient was admitted with chronic pancytopenia and severe thrombocytopenia. Patient received two units of platelets. PMH: liver mass, cirrhosis of liver, alcohol abuse, eye trauma, DM    PT Comments    Pt initially refused ambulation but with encouragement agreed to a short walk in the hall. He reported 9/10 back pain, pain medication requested. He ambulated 180' holding IV pole, no loss of balance.     Recommendations for follow up therapy are one component of a multi-disciplinary discharge planning process, led by the attending physician.  Recommendations may be updated based on patient status, additional functional criteria and insurance authorization.  Follow Up Recommendations  No PT follow up     Assistance Recommended at Discharge PRN  Patient can return home with the following Assistance with cooking/housework;Assist for transportation   Equipment Recommendations  Cane    Recommendations for Other Services       Precautions / Restrictions Precautions Precautions: Fall Precaution Comments: monitor HR Restrictions Weight Bearing Restrictions: No     Mobility  Bed Mobility Overal bed mobility: Needs Assistance Bed Mobility: Supine to Sit     Supine to sit: Supervision, HOB elevated     General bed mobility comments: cues for safety    Transfers Overall transfer level: Needs assistance Equipment used: None Transfers: Sit to/from Stand Sit to Stand: Supervision           General transfer comment: extra time/effort to stand, wide base    Ambulation/Gait Ambulation/Gait assistance: Supervision Gait Distance (Feet): 180 Feet Assistive device: IV Pole Gait Pattern/deviations: Step-through pattern,  Decreased stride length Gait velocity: decr     General Gait Details: steady, no loss of balance   Stairs             Wheelchair Mobility    Modified Rankin (Stroke Patients Only)       Balance Overall balance assessment: Mild deficits observed, not formally tested                                          Cognition Arousal/Alertness: Awake/alert Behavior During Therapy: WFL for tasks assessed/performed Overall Cognitive Status: Within Functional Limits for tasks assessed                                          Exercises      General Comments General comments (skin integrity, edema, etc.): extensive bruising on low back and L upper arm (pt fell prior to admission)      Pertinent Vitals/Pain Pain Assessment Pain Score: 9  Pain Location: back Pain Descriptors / Indicators: Aching, Discomfort, Guarding, Grimacing Pain Intervention(s): Limited activity within patient's tolerance, Monitored during session, Patient requesting pain meds-RN notified    Home Living                          Prior Function            PT Goals (current goals can now be found in the care plan section) Acute Rehab PT Goals Patient Stated Goal:  to play golf PT Goal Formulation: With patient Time For Goal Achievement: 06/10/22 Potential to Achieve Goals: Good Progress towards PT goals: Progressing toward goals    Frequency    Min 3X/week      PT Plan Current plan remains appropriate    Co-evaluation   Reason for Co-Treatment: Necessary to address cognition/behavior during functional activity PT goals addressed during session: Mobility/safety with mobility OT goals addressed during session: ADL's and self-care      AM-PAC PT "6 Clicks" Mobility   Outcome Measure  Help needed turning from your back to your side while in a flat bed without using bedrails?: None Help needed moving from lying on your back to sitting on the  side of a flat bed without using bedrails?: None Help needed moving to and from a bed to a chair (including a wheelchair)?: None Help needed standing up from a chair using your arms (e.g., wheelchair or bedside chair)?: None Help needed to walk in hospital room?: A Little Help needed climbing 3-5 steps with a railing? : A Little 6 Click Score: 22    End of Session Equipment Utilized During Treatment: Gait belt Activity Tolerance: Patient tolerated treatment well Patient left: with nursing/sitter in room;with call bell/phone within reach;in bed Nurse Communication: Mobility status PT Visit Diagnosis: Unsteadiness on feet (R26.81);Pain     Time: [id number] PT Time Calculation (min) (ACUTE ONLY): 8 min  Charges:  $Gait Training: 8-22 mins                     Blondell Reveal Kistler PT 05/29/2022  Acute Rehabilitation Services  Office [phone]

## 2022-05-29 NOTE — Progress Notes (Signed)
Report called to Karleen Dolphin RN. All questions answered at this time. All patient belongings and paper chart transported with patient. Patient was transferred in the wheelchair by this RN. Handoff completed.

## 2022-05-29 NOTE — Inpatient Diabetes Management (Signed)
Inpatient Diabetes Program Recommendations  AACE/ADA: New Consensus Statement on Inpatient Glycemic Control (2015)  Target Ranges:  Prepandial:   less than 140 mg/dL      Peak postprandial:   less than 180 mg/dL (1-2 hours)      Critically ill patients:  140 - 180 mg/dL   Lab Results  Component Value Date   GLUCAP 190 (H) 05/29/2022   HGBA1C 7.9 (H) 04/03/2022    Review of Glycemic Control  Latest Reference Range & Units 05/28/22 11:43 05/28/22 18:06 05/28/22 21:15 05/29/22 08:23  Glucose-Capillary 70 - 99 mg/dL 264 (H) 143 (H) 227 (H) 190 (H)  (H): Data is abnormally high Diabetes history: Type 2 DM Outpatient Diabetes medications: Metformin 500 mg BID Current orders for Inpatient glycemic control: Novolog 0-15 units TID & HS  Inpatient Diabetes Program Recommendations:    Consider adding Semglee 8 units QD.  Would NOT recommend Metformin at discharge.   Thanks, Bronson Curb, MSN, RNC-OB Diabetes Coordinator [phone] (8a-5p)

## 2022-05-29 NOTE — Progress Notes (Signed)
  Progress Note   Patient: [name] [id number] DOB: [birth year] DOA: 05/22/2022     6 DOS: the patient was seen and examined on 05/29/2022 at 8:09AM      Brief hospital course: [name] is a 52 y.o. 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  In the ER, found to have worse pancytopenia.    Subsequently found to have MSSA bacteremia.     Assessment and Plan: * Other pancytopenia (HCC) Anemia, normocytic Leukopenia, neutropenia Severe chronic thrombocytopenia Stable unchanged, no new bleeding   MSSA bacteremia Cultures from yesterday pending - Continue Ancef - I am uncertain he can get a PICC   Alcohol withdrawal with complication with inpatient treatment ([place]) Resolved - Stop PRN Ativan - Finish Librium taper - Start naltrexone at d/c  - Continue thiamine and folate  Liver mass - Will consider repeat MRI liver prior to discharge      Benign prostatic hyperplasia (BPH) with straining on urination - Continue Flomax  DM (diabetes mellitus), type 2 (HCC) Glucose elevated - Stop metformin - Start semglee - Continue sliding scale corrections  Hypokalemia - Supplement potassium  Hypertension Amlodipine, stopped. BP normal - Continue furosemide, spironolactone  Cirrhosis of liver (HCC) Actively drinking - Continue furosemide, spironolactone - Continue lactulose            Subjective: No nursing concerns.  Patient has no new complaints.  No fever, no confusion.  Blood cultures were repeated yesterday.  He was transferred out of the unit yesterday but still awaiting a bed.     Physical Exam: BP 129/78   Pulse 80   Temp 97.7 F (36.5 C) (Oral)   Resp 11   Ht '5\' 7"'$  (1.702 m)   Wt 68.7 kg   SpO2 95%   BMI 23.72 kg/m   Thin adult male, lying in bed, tired but oriented and interactive RRR, no murmurs, no peripheral edema Respiratory rate normal, lungs clear without rales or  wheezes Abdomen soft without tenderness palpation or guarding Attention normal, affect appropriate, judgment insight appear normal  Data Reviewed: CBC shows stable extremely low blood counts LFTs normal, total bilirubin stable at 2.5 Metabolic panel shows potassium 3.4    Family Communication:     Disposition: Status is: Inpatient         Author: Edwin Dada, MD 05/29/2022 10:30 AM  For on call review [email or web address].

## 2022-05-30 ENCOUNTER — Other Ambulatory Visit (HOSPITAL_COMMUNITY): Payer: Self-pay

## 2022-05-30 DIAGNOSIS — F10939 Alcohol use, unspecified with withdrawal, unspecified: Secondary | ICD-10-CM

## 2022-05-30 LAB — BASIC METABOLIC PANEL
Anion gap: 8 (ref 5–15)
BUN: 6 mg/dL (ref 6–20)
CO2: 28 mmol/L (ref 22–32)
Calcium: 8.4 mg/dL — ABNORMAL LOW (ref 8.9–10.3)
Chloride: 100 mmol/L (ref 98–111)
Creatinine, Ser: 0.57 mg/dL — ABNORMAL LOW (ref 0.61–1.24)
GFR, Estimated: >60 mL/min (ref >60)
Glucose, Bld: 191 mg/dL — ABNORMAL HIGH (ref 70–99)
Potassium: 3.9 mmol/L (ref 3.5–5.1)
Sodium: 136 mmol/L (ref 135–145)

## 2022-05-30 LAB — GLUCOSE, CAPILLARY
Glucose-Capillary: 179 mg/dL — ABNORMAL HIGH (ref 70–99)
Glucose-Capillary: 208 mg/dL — ABNORMAL HIGH (ref 70–99)
Glucose-Capillary: 221 mg/dL — ABNORMAL HIGH (ref 70–99)
Glucose-Capillary: 241 mg/dL — ABNORMAL HIGH (ref 70–99)

## 2022-05-30 LAB — CBC
HCT: 30.7 % — ABNORMAL LOW (ref 39.0–52.0)
Hemoglobin: 9.7 g/dL — ABNORMAL LOW (ref 13.0–17.0)
MCH: 27.6 pg (ref 26.0–34.0)
MCHC: 31.6 g/dL (ref 30.0–36.0)
MCV: 87.2 fL (ref 80.0–100.0)
Platelets: 32 10*3/uL — ABNORMAL LOW (ref 150–400)
RBC: 3.52 MIL/uL — ABNORMAL LOW (ref 4.22–5.81)
RDW: 21 % — ABNORMAL HIGH (ref 11.5–15.5)
WBC: 2 10*3/uL — ABNORMAL LOW (ref 4.0–10.5)
nRBC: 0 % (ref 0.0–0.2)

## 2022-05-30 LAB — CULTURE, BLOOD (ROUTINE X 2)
Culture: NO GROWTH
Special Requests: ADEQUATE

## 2022-05-30 MED ORDER — CEFAZOLIN IV (FOR PTA / DISCHARGE USE ONLY): 2.0000 g | Freq: Three times a day (TID) | INTRAVENOUS | 0 refills | Status: AC

## 2022-05-30 MED ORDER — SODIUM CHLORIDE 0.9% FLUSH: 10.0000 mL | INTRAVENOUS | Status: DC | PRN

## 2022-05-30 MED ORDER — SODIUM CHLORIDE 0.9% FLUSH
10.0000 mL | Freq: Two times a day (BID) | INTRAVENOUS | Status: DC
  Administered 2022-05-30 – 2022-05-31 (×2): 10 mL

## 2022-05-30 NOTE — Progress Notes (Signed)
Occupational Therapy Treatment Patient Details Name: [name] MRN: [id number] DOB: 07-15-1970 Today's Date: 05/30/2022   History of present illness Patient is a 52 year old male who presented on 2/29 with recurrent falls in setting of ongoing drinking. Patient was admitted with chronic pancytopenia and severe thrombocytopenia. Patient received two units of platelets. PMH: liver mass, cirrhosis of liver, alcohol abuse, eye trauma, DM   OT comments  Patient complaining about not being allowed up and wanting to do more. Patient independent with bed mobility, ambulated to bathroom and performed toileting independently. Therapist has patient walk in the hall with no device, take large steps, walk quickly and stop quickly, weave around tiles in the floor and patient had no loss of balance. Reports he feels generally weak from not being allowed up but requires no physical assistance today. From an OT standpoint patient has no further OT needs.    Recommendations for follow up therapy are one component of a multi-disciplinary discharge planning process, led by the attending physician.  Recommendations may be updated based on patient status, additional functional criteria and insurance authorization.    Follow Up Recommendations  No OT follow up     Assistance Recommended at Discharge PRN  Patient can return home with the following      Equipment Recommendations  None recommended by OT    Recommendations for Other Services      Precautions / Restrictions Precautions Precautions: Fall Restrictions Weight Bearing Restrictions: No       Mobility Bed Mobility Overal bed mobility: Independent                  Transfers Overall transfer level: Independent                       Balance Overall balance assessment: Mild deficits observed, not formally tested                                         ADL either performed or assessed with clinical  judgement   ADL Overall ADL's : Independent                                       General ADL Comments: Demonstrated independence with toileting, ambulation, standing ADLs and feeding.    Extremity/Trunk Assessment Upper Extremity Assessment Upper Extremity Assessment: Overall WFL for tasks assessed   Lower Extremity Assessment Lower Extremity Assessment: Defer to PT evaluation        Vision Patient Visual Report: No change from baseline     Perception     Praxis      Cognition Arousal/Alertness: Awake/alert Behavior During Therapy: WFL for tasks assessed/performed Overall Cognitive Status: Within Functional Limits for tasks assessed                                          Exercises      Shoulder Instructions       General Comments      Pertinent Vitals/ Pain       Pain Assessment Pain Assessment: No/denies pain  Home Living  Prior Functioning/Environment              Frequency           Progress Toward Goals  OT Goals(current goals can now be found in the care plan section)  Progress towards OT goals: Goals met/education completed, patient discharged from [facility] All goals met and education completed, patient discharged from OT services    Co-evaluation          OT goals addressed during session: ADL's and self-care      AM-PAC OT "6 Clicks" Daily Activity     Outcome Measure   Help from another person eating meals?: None Help from another person taking care of personal grooming?: None Help from another person toileting, which includes using toliet, bedpan, or urinal?: None Help from another person bathing (including washing, rinsing, drying)?: None Help from another person to put on and taking off regular upper body clothing?: None Help from another person to put on and taking off regular lower body clothing?: None 6 Click Score:  24    End of Session    OT Visit Diagnosis: Unsteadiness on feet (R26.81)   Activity Tolerance Patient tolerated treatment well   Patient Left in chair;with call bell/phone within reach   Nurse Communication Mobility status        Time: [id number] OT Time Calculation (min): 10 min  Charges: OT General Charges $OT Visit: 1 Visit OT Treatments $Self Care/Home Management : 8-22 mins  [name], OTR/L [place]  Office [phone]   [name] 05/30/2022, 12:51 PM

## 2022-05-30 NOTE — TOC Progression Note (Addendum)
Transition of Care [facility]) - Progression Note    Patient Details  Name: [name] MRN: [id number] Date of Birth: November 04, 1970  [facility] CM/SW [place], RN Phone [phone]  05/30/2022, 1:48 PM  Clinical Narrative:    DME cane ordered per [place] and will be delivered to the bedside.   1350 CM received message from MD to inquire about patient discharging home with IV abx. CM has reached out to [facility] with [facility] for [facility]. Pam will follow up with patient and wife for teaching and assistance with determining safe plan for home infusion. Barrier may include Home health and when they can follow up, which may be Monday.  Pam to meet with patient and wife this afternoon to assess if patient will be safe to administer until  Home health is able to initiate care. TOC will continue to follow.    Expected Discharge Plan: Home/Self Care Barriers to Discharge: Continued Medical Work up  Expected Discharge Plan and Services   Discharge Planning Services: CM Consult   Living arrangements for the past 2 months: Single Family Home                                       Social Determinants of Health (SDOH) Interventions SDOH Screenings   Food Insecurity: No Food Insecurity (05/24/2022)  Housing: Low Risk  (05/24/2022)  Transportation Needs: No Transportation Needs (05/24/2022)  Utilities: Not At Risk (05/24/2022)  Alcohol Screen: High Risk (12/09/2020)  Tobacco Use: Low Risk  (05/23/2022)    Readmission Risk Interventions     No data to display

## 2022-05-30 NOTE — Progress Notes (Signed)
PHARMACY CONSULT NOTE FOR:  OUTPATIENT  PARENTERAL ANTIBIOTIC THERAPY (OPAT)  Indication: MSSA thrombophlebitis Regimen: Cefazolin 2g IV every 8 hours End date: 06/25/22  IV antibiotic discharge orders are pended. To discharging provider:  please sign these orders via discharge navigator,  Select New Orders & click on the button choice - Manage This Unsigned Work.     Thank you for allowing pharmacy to be a part of this patient's care.  Alycia Rossetti, PharmD, BCPS Infectious Diseases Clinical Pharmacist 05/30/2022 2:59 PM   **Pharmacist phone directory can now be found on [email or web address] ([email or web address]).  Listed under [place].

## 2022-05-30 NOTE — Progress Notes (Addendum)
[facility] Infectious Diseases Follow Up Note  Patient Identification: Patient Name: Kollen Lou MRN: [id number] [place] Date: 05/22/2022  6:33 PM Age: 52 y.o.Today's Date: 05/30/2022  Reason for Visit: MSSA bacteremia   Principal Problem:   Other pancytopenia ([place]) Active Problems:   Cirrhosis of liver (HCC)   Hypertension   Alcohol withdrawal with complication with inpatient treatment ([place])   Hypokalemia   Hypomagnesemia   DM (diabetes mellitus), type 2 (HCC)   Liver mass   Benign prostatic hyperplasia (BPH) with straining on urination   History of eye trauma   Hematoma of right flank   Hypophosphatemia   Hyponatremia   Hypoxia   UTI (urinary tract infection)   MSSA bacteremia   Antibiotics:  Vancomycin 3/4-c Ceftrtiaxone 3/3-3/4 Cefazolin 3/4-c  Lines/Hardwares: PIV  Interval Events: Remains afebrile and pancytopenic   Assessment 52 year old male with history of alcohol abuse, liver cirrhosis with associated portal hypertension and pancytopenia, HTN, DM 2 presented with recurrent falls in the setting of ongoing alcohol use.  Febrile on hospital day 3 with workup leading to MSSA bacteremia and purulence noted at his right [facility] PIV site.  CT concerning for thrombophlebitis/cellulitis in the RT [facility] vein  Repeat blood cultures 3/6 no growth in 2 days 3/3 urine culture with MSSA 3/5 TTE with no vegetation/endocarditis Poor TEE candidate due to varices and thrombocytopenia  Recommendations Continue IV cefazolin as is  Agree with treating as a complicated bacteremia esp cannot get a TEE to exclude endocarditis. Complete 4 weeks of IV cefazolin from 3/6 if repeat blood cx continue to remain negative OK to place PICC if blood cx 3/6 negative in 72 hrs, likely tomorrow.  If any issues with PICC line/IV abtx, can do at least first 2 weeks of IV followed by weekly oritavancin/dalbavancin. Not a good candidate for  linezolid due to pancytopenia MRI liver when appropriate for concerns of HCC  Monitor CBC and BMP weekly on abtx ID available as needded, please call with questions or if 3/6 blood cx is positive  D/w primary and ID pharmacy   Rest of the management as per the primary team. Thank you for the consult. Please page with pertinent questions or concerns.  ______________________________________________________________________ Subjective patient seen and examined at the bedside. Doing well, some pain at the area of bruise in the rt lower back but no midline back pain. Denies fevers, chills. Denies nausea, vomiting.   Past Medical History:  Diagnosis Date   Alcoholic ([place])    Anxiety    Diabetes ([place])    DVT (deep venous thrombosis) ([place])    Hypertension    Past Surgical History:  Procedure Laterality Date   BLOOD PATCH     ESOPHAGOGASTRODUODENOSCOPY N/A 12/26/2021   Procedure: ESOPHAGOGASTRODUODENOSCOPY (EGD);  Surgeon: Ronnette Juniper, MD;  Location: [facility];  Service: Gastroenterology;  Laterality: N/A;   TONSILLECTOMY      Vitals BP (!) 142/95 (BP Location: Right Arm)   Pulse 87   Temp 98 F (36.7 C) (Oral)   Resp 14   Ht '5\' 7"'$  (1.702 m)   Wt 70.6 kg   SpO2 100%   BMI 24.38 kg/m    Physical Exam Constitutional:  lying in the bed and appears comfortable     Comments:   Cardiovascular:     Rate and Rhythm: Normal rate and regular rhythm.     Heart sounds: s1s2  Pulmonary:     Effort: Pulmonary effort is normal on room air    Comments: Normal  breath sounds  Abdominal:     Palpations: Abdomen is soft.     Tenderness: non distended and non tender   Musculoskeletal:        General: No swelling or tenderness in peripheral joints, No vertebral tenderness. Large bruise in the rt lower back and rt flank   Skin:    Comments: No rashes  Neurological:     General: awake, alert and oriented   Psychiatric:        Mood and Affect: Mood normal.   Pertinent  Microbiology Results for orders placed or performed during the hospital encounter of 05/22/22  MRSA Next Gen by PCR, Nasal     Status: None   Collection Time: 05/23/22 12:26 AM   Specimen: Nasal Mucosa; Nasal Swab  Result Value Ref Range Status   MRSA by PCR Next Gen NOT DETECTED NOT DETECTED Final    Comment: (NOTE) The GeneXpert MRSA Assay (FDA approved for NASAL specimens only), is one component of a comprehensive MRSA colonization surveillance program. It is not intended to diagnose MRSA infection nor to guide or monitor treatment for MRSA infections. Test performance is not FDA approved in patients less than 62 years old. Performed at [facility], [place] [address] [place], [place] [address]   Urine Culture (for pregnant, neutropenic or urologic patients or patients with an indwelling urinary catheter)     Status: Abnormal   Collection Time: 05/25/22 12:48 PM   Specimen: Urine, Clean Catch  Result Value Ref Range Status   Specimen Description   Final    URINE, CLEAN CATCH Performed at [facility], [place] [address] [place], [place] [address]    Special Requests   Final    NONE Performed at [facility], [place] [address] [place], [place] [address]    Culture >=100,000 COLONIES/mL STAPHYLOCOCCUS AUREUS (A)  Final   Report Status 05/27/2022 FINAL  Final   Organism ID, Bacteria STAPHYLOCOCCUS AUREUS (A)  Final      Susceptibility   Staphylococcus aureus - MIC*    CIPROFLOXACIN <=0.5 SENSITIVE Sensitive     GENTAMICIN <=0.5 SENSITIVE Sensitive     NITROFURANTOIN <=16 SENSITIVE Sensitive     OXACILLIN <=0.25 SENSITIVE Sensitive     TETRACYCLINE <=1 SENSITIVE Sensitive     VANCOMYCIN <=0.5 SENSITIVE Sensitive     TRIMETH/SULFA <=10 SENSITIVE Sensitive     CLINDAMYCIN <=0.25 SENSITIVE Sensitive     RIFAMPIN <=0.5 SENSITIVE Sensitive     Inducible Clindamycin NEGATIVE Sensitive     * >=100,000 COLONIES/mL STAPHYLOCOCCUS  AUREUS  Culture, blood (Routine X 2) w Reflex to ID Panel     Status: None (Preliminary result)   Collection Time: 05/25/22  2:25 PM   Specimen: BLOOD  Result Value Ref Range Status   Specimen Description   Final    BLOOD BLOOD RIGHT ARM BLOOD RIGHT HAND Performed at [facility] [address], [place], [place] [address]    Special Requests   Final    BOTTLES DRAWN AEROBIC AND ANAEROBIC Blood Culture adequate volume Performed at [facility] [address] [place], [place] [address]    Culture   Final    NO GROWTH 4 DAYS Performed at [facility], [place] [address] [place],  [address]    Report Status PENDING  Incomplete  Culture, blood (Routine X 2) w Reflex to ID Panel     Status: Abnormal   Collection Time: 05/25/22  2:26  PM   Specimen: BLOOD  Result Value Ref Range Status   Specimen Description   Final    BLOOD BLOOD LEFT ARM BLOOD LEFT HAND Performed at [facility] [address], [place], [place] [address]    Special Requests   Final    BOTTLES DRAWN AEROBIC AND ANAEROBIC Blood Culture adequate volume Performed at [facility] [address] [place], [place] [address]    Culture  Setup Time   Final    GRAM POSITIVE COCCI IN CLUSTERS IN BOTH AEROBIC AND ANAEROBIC BOTTLES CRITICAL RESULT CALLED TO, READ BACK BY AND VERIFIED WITH:  C/ PHARMD J. GADHIA 05/26/22 0950 A. LAFRANCE Performed at [facility], [place] [address] [place], [place] [address]    Culture STAPHYLOCOCCUS AUREUS (A)  Final   Report Status 05/28/2022 FINAL  Final   Organism ID, Bacteria STAPHYLOCOCCUS AUREUS  Final      Susceptibility   Staphylococcus aureus - MIC*    CIPROFLOXACIN <=0.5 SENSITIVE Sensitive     ERYTHROMYCIN <=0.25 SENSITIVE Sensitive     GENTAMICIN <=0.5 SENSITIVE Sensitive     OXACILLIN 0.5 SENSITIVE Sensitive     TETRACYCLINE <=1 SENSITIVE Sensitive     VANCOMYCIN 1 SENSITIVE  Sensitive     TRIMETH/SULFA <=10 SENSITIVE Sensitive     CLINDAMYCIN <=0.25 SENSITIVE Sensitive     RIFAMPIN <=0.5 SENSITIVE Sensitive     Inducible Clindamycin NEGATIVE Sensitive     * STAPHYLOCOCCUS AUREUS  Blood Culture ID Panel (Reflexed)     Status: Abnormal   Collection Time: 05/25/22  2:26 PM  Result Value Ref Range Status   Enterococcus faecalis NOT DETECTED NOT DETECTED Final   Enterococcus Faecium NOT DETECTED NOT DETECTED Final   Listeria monocytogenes NOT DETECTED NOT DETECTED Final   Staphylococcus species DETECTED (A) NOT DETECTED Final    Comment: CRITICAL RESULT CALLED TO, READ BACK BY AND VERIFIED WITH:  C/ PHARMD J. GADHIA 05/26/22 0950 A. LAFRANCE    Staphylococcus aureus (BCID) DETECTED (A) NOT DETECTED Final    Comment: CRITICAL RESULT CALLED TO, READ BACK BY AND VERIFIED WITH:  C/ PHARMD J. GADHIA 05/26/22 0950 A. LAFRANCE    Staphylococcus epidermidis NOT DETECTED NOT DETECTED Final   Staphylococcus lugdunensis NOT DETECTED NOT DETECTED Final   Streptococcus species NOT DETECTED NOT DETECTED Final   Streptococcus agalactiae NOT DETECTED NOT DETECTED Final   Streptococcus pneumoniae NOT DETECTED NOT DETECTED Final   Streptococcus pyogenes NOT DETECTED NOT DETECTED Final   A.calcoaceticus-baumannii NOT DETECTED NOT DETECTED Final   Bacteroides fragilis NOT DETECTED NOT DETECTED Final   Enterobacterales NOT DETECTED NOT DETECTED Final   Enterobacter cloacae complex NOT DETECTED NOT DETECTED Final   Escherichia coli NOT DETECTED NOT DETECTED Final   Klebsiella aerogenes NOT DETECTED NOT DETECTED Final   Klebsiella oxytoca NOT DETECTED NOT DETECTED Final   Klebsiella pneumoniae NOT DETECTED NOT DETECTED Final   Proteus species NOT DETECTED NOT DETECTED Final   Salmonella species NOT DETECTED NOT DETECTED Final   Serratia marcescens NOT DETECTED NOT DETECTED Final   Haemophilus influenzae NOT DETECTED NOT DETECTED Final   Neisseria meningitidis NOT DETECTED NOT  DETECTED Final   Pseudomonas aeruginosa NOT DETECTED NOT DETECTED Final   Stenotrophomonas maltophilia NOT DETECTED NOT DETECTED Final   Candida albicans NOT DETECTED NOT DETECTED Final   Candida auris NOT DETECTED NOT DETECTED Final   Candida glabrata NOT DETECTED NOT DETECTED Final   Candida krusei NOT DETECTED NOT DETECTED Final  Candida parapsilosis NOT DETECTED NOT DETECTED Final   Candida tropicalis NOT DETECTED NOT DETECTED Final   Cryptococcus neoformans/gattii NOT DETECTED NOT DETECTED Final   Meth resistant mecA/C and MREJ NOT DETECTED NOT DETECTED Final    Comment: Performed at [facility], [address] [place], [place] [address]  Resp panel by RT-PCR (RSV, Flu A&B, Covid) Anterior Nasal Swab     Status: None   Collection Time: 05/25/22  8:17 PM   Specimen: Anterior Nasal Swab  Result Value Ref Range Status   SARS Coronavirus 2 by RT PCR NEGATIVE NEGATIVE Final    Comment: (NOTE) SARS-CoV-2 target nucleic acids are NOT DETECTED.  The SARS-CoV-2 RNA is generally detectable in upper respiratory specimens during the acute phase of infection. The lowest concentration of SARS-CoV-2 viral copies this assay can detect is 138 copies/mL. A negative result does not preclude SARS-Cov-2 infection and should not be used as the sole basis for treatment or other patient management decisions. A negative result may occur with  improper specimen collection/handling, submission of specimen other than nasopharyngeal swab, presence of viral mutation(s) within the areas targeted by this assay, and inadequate number of viral copies(<138 copies/mL). A negative result must be combined with clinical observations, patient history, and epidemiological information. The expected result is Negative.  Fact Sheet for Patients:  EntrepreneurPulse.com.au  Fact Sheet for Healthcare Providers:  IncredibleEmployment.be  This test is no t yet approved or  cleared by the [place] FDA and  has been authorized for detection and/or diagnosis of SARS-CoV-2 by FDA under an Emergency Use Authorization (EUA). This EUA will remain  in effect (meaning this test can be used) for the duration of the COVID-19 declaration under Section 564(b)(1) of the Act, 21 U.S.C.section 360bbb-3(b)(1), unless the authorization is terminated  or revoked sooner.       Influenza A by PCR NEGATIVE NEGATIVE Final   Influenza B by PCR NEGATIVE NEGATIVE Final    Comment: (NOTE) The Xpert Xpress SARS-CoV-2/FLU/RSV plus assay is intended as an aid in the diagnosis of influenza from Nasopharyngeal swab specimens and should not be used as a sole basis for treatment. Nasal washings and aspirates are unacceptable for Xpert Xpress SARS-CoV-2/FLU/RSV testing.  Fact Sheet for Patients: EntrepreneurPulse.com.au  Fact Sheet for Healthcare Providers: IncredibleEmployment.be  This test is not yet approved or cleared by the [place] FDA and has been authorized for detection and/or diagnosis of SARS-CoV-2 by FDA under an Emergency Use Authorization (EUA). This EUA will remain in effect (meaning this test can be used) for the duration of the COVID-19 declaration under Section 564(b)(1) of the Act, 21 U.S.C. section 360bbb-3(b)(1), unless the authorization is terminated or revoked.     Resp Syncytial Virus by PCR NEGATIVE NEGATIVE Final    Comment: (NOTE) Fact Sheet for Patients: EntrepreneurPulse.com.au  Fact Sheet for Healthcare Providers: IncredibleEmployment.be  This test is not yet approved or cleared by the [place] FDA and has been authorized for detection and/or diagnosis of SARS-CoV-2 by FDA under an Emergency Use Authorization (EUA). This EUA will remain in effect (meaning this test can be used) for the duration of the COVID-19 declaration under Section 564(b)(1) of the Act, 21  U.S.C. section 360bbb-3(b)(1), unless the authorization is terminated or revoked.  Performed at [facility], [place] [address], [place], [place] [address]   Culture, blood (Routine X 2) w Reflex to ID Panel     Status: None (Preliminary result)   Collection Time: 05/28/22 12:03  PM   Specimen: BLOOD RIGHT ARM  Result Value Ref Range Status   Specimen Description   Final    BLOOD RIGHT ARM Performed at [facility] [address], [place], [place] [address]    Special Requests   Final    BOTTLES DRAWN AEROBIC AND ANAEROBIC Blood Culture adequate volume Performed at [facility] [address], [place], [place] [address]    Culture   Final    NO GROWTH < 24 HOURS Performed at [facility] [address], [place], [place] [address]    Report Status PENDING  Incomplete  Culture, blood (Routine X 2) w Reflex to ID Panel     Status: None (Preliminary result)   Collection Time: 05/28/22 12:03 PM   Specimen: BLOOD  Result Value Ref Range Status   Specimen Description   Final    BLOOD SITE NOT SPECIFIED Performed at [facility] [address], [place], [place] [address]    Special Requests   Final    BOTTLES DRAWN AEROBIC AND ANAEROBIC Blood Culture adequate volume Performed at [facility] [address], [place], [place] [address]    Culture   Final    NO GROWTH < 24 HOURS Performed at [facility] [address], [place], [place] [address]    Report Status PENDING  Incomplete    Pertinent Lab.    Latest Ref Rng & Units 05/29/2022    3:12 AM 05/28/2022    7:24 AM 05/27/2022    6:17 PM  CBC  WBC 4.0 - 10.5 K/uL 1.6  1.3  2.3   Hemoglobin 13.0 - 17.0 g/dL 9.4  9.9  10.6   Hematocrit 39.0 - 52.0 % 30.1  30.2  32.7   Platelets 150 - 400 K/uL '28  24  28       '$ Latest Ref Rng & Units 05/29/2022    3:12 AM 05/28/2022    7:24 AM 05/27/2022    3:15 AM  CMP  Glucose 70 - 99 mg/dL 191  181   130   BUN 6 - 20 mg/dL '7  7  8   '$ Creatinine 0.61 - 1.24 mg/dL 0.60  0.59  0.61   Sodium 135 - 145 mmol/L 133  133  129   Potassium 3.5 - 5.1 mmol/L 3.4  3.2  2.9   Chloride 98 - 111 mmol/L 98  100  94   CO2 22 - 32 mmol/L '27  28  30   '$ Calcium 8.9 - 10.3 mg/dL 8.2  8.0  8.1   Total Protein 6.5 - 8.1 g/dL 5.9  6.2  6.4   Total Bilirubin 0.3 - 1.2 mg/dL 2.5  2.7  2.8   Alkaline Phos 38 - 126 U/L 70  74  70   AST 15 - 41 U/L 55  63  87   ALT 0 - 44 U/L 40  46  61      Pertinent Imaging today Plain films and CT images have been personally visualized and interpreted; radiology reports have been reviewed. Decision making incorporated into the Impression / Recommendations.  CT FOREARM RIGHT W CONTRAST  Addendum Date: 05/28/2022   ADDENDUM REPORT: 05/28/2022 03:12 ADDENDUM: There is a 4.8 cm mass in the right lobe of the liver which has been seen on prior imaging of the abdomen and pelvis most recently 05/22/2022 and was biopsied by ultrasound guidance on 12/27/2021. Correlate clinically with prior biopsy  results. This was previously considered suspicious for hepatocellular carcinoma. The liver is cirrhotic and steatotic. Electronically Signed   By: Telford Nab M.D.   On: 05/28/2022 03:12   Result Date: 05/28/2022 CLINICAL DATA:  Right upper extremity infection, possibly from IV, originating in the antecubital area. EXAM: CT OF THE UPPER RIGHT EXTREMITY WITH CONTRAST (CT RIGHT HUMERUS, CT RIGHT FOREARM COMBINED REPORT) TECHNIQUE: Multidetector CT imaging of the upper right extremity was performed from the shoulder through the wrist according to the standard protocol following intravenous contrast administration, in soft tissue and bone window settings with multiplanar reformatting. RADIATION DOSE REDUCTION: This exam was performed according to the departmental dose-optimization program which includes automated exposure control, adjustment of the mA and/or kV according to patient size and/or use of  iterative reconstruction technique. CONTRAST:  197m OMNIPAQUE IOHEXOL 300 MG/ML  SOLN COMPARISON:  None Available. FINDINGS: Bones/Joint/Cartilage There is normal bone mineralization without evidence of acute fractures, primarily pathologic bone lesions or acute osteomyelitis from the right shoulder through the wrist. A nondisplaced chip fracture is noted of lateral edge of the coronoid process of the ulna, but is believed chronic due to lack of a joint effusion. There are minor subcortical cystic changes scattered along the humeral rotator cuff insertion, but no findings of chronic rotator cuff arthropathy or through and through rotator cuff tears. There is narrowing and mild spurring at the [facility]. There is trace spurring of the inferior glenohumeral joint and the medial aspect of the trochleoulnar joint. There is mild narrowing of the triscaphe and first CMC joints. There are no findings of erosive arthropathy throughout. Enthesopathic spurring of the lateral humeral epicondyle is incidentally seen. Ligaments Suboptimally assessed by CT. Muscles and Tendons There is normal muscle bulk throughout the imaged right upper extremity. No intramuscular masses or fluid collections are seen. Area tendons are not well evaluated with this technique but are grossly intact as far as visualized. Soft tissues The contrast bolus phase in which the right upper extremity was imaged was predominantly the venous phase. Large arteries of the right upper extremity opacify well at least the elbow but are not well seen in the forearm and wrist, probably due to the bolus phase. There is a short-segment of high-grade narrowing due to thrombosis in the antecubital vein at and just above the level of the elbow crease, surrounded by subcutaneous edema which may indicate thrombophlebitis/cellulitis. There is no underlying abscess. Remainder of the right upper extremity veins opacify well. There is no evidence of soft tissue gas or visible  deep soft tissue abnormality which would be suspicious for acute necrotizing fasciitis, but lack of CT findings of this process should not delay surgery in the presence of clinical suspicion. IMPRESSION: 1. Short-segment of high-grade narrowing due to thrombosis, in the antecubital vein at and just above the level of the elbow crease, surrounded by subcutaneous edema which may indicate thrombophlebitis/cellulitis. 2. No evidence of soft tissue gas or deep soft tissue abnormality which would be suspicious for acute necrotizing fasciitis, but lack of CT findings of this process should not delay surgery in the presence of clinical suspicion. 3. No evidence of acute fractures, primarily pathologic bone lesions or acute osteomyelitis from the right shoulder through the wrist. 4. Degenerative changes described above. 5. Nondisplaced chip fracture of the lateral edge of the coronoid process of the ulna, believed chronic due to the lack of a joint effusion. 6. Enthesopathic spurring of the lateral humeral epicondyle. Electronically Signed: By: KNinfa LindenD.  On: 05/27/2022 21:32   CT HUMERUS RIGHT W CONTRAST  Addendum Date: 05/28/2022   ADDENDUM REPORT: 05/28/2022 03:12 ADDENDUM: There is a 4.8 cm mass in the right lobe of the liver which has been seen on prior imaging of the abdomen and pelvis most recently 05/22/2022 and was biopsied by ultrasound guidance on 12/27/2021. Correlate clinically with prior biopsy results. This was previously considered suspicious for hepatocellular carcinoma. The liver is cirrhotic and steatotic. Electronically Signed   By: Telford Nab M.D.   On: 05/28/2022 03:12   Result Date: 05/28/2022 CLINICAL DATA:  Right upper extremity infection, possibly from IV, originating in the antecubital area. EXAM: CT OF THE UPPER RIGHT EXTREMITY WITH CONTRAST (CT RIGHT HUMERUS, CT RIGHT FOREARM COMBINED REPORT) TECHNIQUE: Multidetector CT imaging of the upper right extremity was performed from the  shoulder through the wrist according to the standard protocol following intravenous contrast administration, in soft tissue and bone window settings with multiplanar reformatting. RADIATION DOSE REDUCTION: This exam was performed according to the departmental dose-optimization program which includes automated exposure control, adjustment of the mA and/or kV according to patient size and/or use of iterative reconstruction technique. CONTRAST:  158m OMNIPAQUE IOHEXOL 300 MG/ML  SOLN COMPARISON:  None Available. FINDINGS: Bones/Joint/Cartilage There is normal bone mineralization without evidence of acute fractures, primarily pathologic bone lesions or acute osteomyelitis from the right shoulder through the wrist. A nondisplaced chip fracture is noted of lateral edge of the coronoid process of the ulna, but is believed chronic due to lack of a joint effusion. There are minor subcortical cystic changes scattered along the humeral rotator cuff insertion, but no findings of chronic rotator cuff arthropathy or through and through rotator cuff tears. There is narrowing and mild spurring at the [facility]. There is trace spurring of the inferior glenohumeral joint and the medial aspect of the trochleoulnar joint. There is mild narrowing of the triscaphe and first CMC joints. There are no findings of erosive arthropathy throughout. Enthesopathic spurring of the lateral humeral epicondyle is incidentally seen. Ligaments Suboptimally assessed by CT. Muscles and Tendons There is normal muscle bulk throughout the imaged right upper extremity. No intramuscular masses or fluid collections are seen. Area tendons are not well evaluated with this technique but are grossly intact as far as visualized. Soft tissues The contrast bolus phase in which the right upper extremity was imaged was predominantly the venous phase. Large arteries of the right upper extremity opacify well at least the elbow but are not well seen in the forearm and  wrist, probably due to the bolus phase. There is a short-segment of high-grade narrowing due to thrombosis in the antecubital vein at and just above the level of the elbow crease, surrounded by subcutaneous edema which may indicate thrombophlebitis/cellulitis. There is no underlying abscess. Remainder of the right upper extremity veins opacify well. There is no evidence of soft tissue gas or visible deep soft tissue abnormality which would be suspicious for acute necrotizing fasciitis, but lack of CT findings of this process should not delay surgery in the presence of clinical suspicion. IMPRESSION: 1. Short-segment of high-grade narrowing due to thrombosis, in the antecubital vein at and just above the level of the elbow crease, surrounded by subcutaneous edema which may indicate thrombophlebitis/cellulitis. 2. No evidence of soft tissue gas or deep soft tissue abnormality which would be suspicious for acute necrotizing fasciitis, but lack of CT findings of this process should not delay surgery in the presence of clinical suspicion. 3. No evidence  of acute fractures, primarily pathologic bone lesions or acute osteomyelitis from the right shoulder through the wrist. 4. Degenerative changes described above. 5. Nondisplaced chip fracture of the lateral edge of the coronoid process of the ulna, believed chronic due to the lack of a joint effusion. 6. Enthesopathic spurring of the lateral humeral epicondyle. Electronically Signed: By: Telford Nab M.D. On: 05/27/2022 21:32   ECHOCARDIOGRAM COMPLETE  Result Date: 05/27/2022    ECHOCARDIOGRAM REPORT   Patient Name:   JOVANI LABER Date of Exam: 05/27/2022 Medical Rec #:  [id number]            Height:       67.0 in Accession #:    [id number]           Weight:       147.5 lb Date of Birth:  [birth year]            BSA:          1.777 m Patient Age:    80 years             BP:           126/72 mmHg Patient Gender: M                    HR:           100 bpm. Exam  Location:  Inpatient Procedure: 2D Echo Indications:    bacteremia  History:        Patient has no prior history of Echocardiogram examinations.                 Risk Factors:Diabetes and Hypertension.  Sonographer:    Harvie Junior Referring Phys: [place]  1. Left ventricular ejection fraction, by estimation, is 60 to 65%. The left ventricle has normal function. The left ventricle has no regional wall motion abnormalities. Left ventricular diastolic parameters were normal.  2. Right ventricular systolic function is normal. The right ventricular size is normal. There is normal pulmonary artery systolic pressure. The estimated right ventricular systolic pressure is 123456 mmHg.  3. The mitral valve is grossly normal. No evidence of mitral valve regurgitation. No evidence of mitral stenosis.  4. The aortic valve is tricuspid. Aortic valve regurgitation is not visualized. No aortic stenosis is present.  5. The inferior vena cava is normal in size with greater than 50% respiratory variability, suggesting right atrial pressure of 3 mmHg. Conclusion(s)/Recommendation(s): No evidence of valvular vegetations on this transthoracic echocardiogram. Consider a transesophageal echocardiogram to exclude infective endocarditis if clinically indicated. FINDINGS  Left Ventricle: Left ventricular ejection fraction, by estimation, is 60 to 65%. The left ventricle has normal function. The left ventricle has no regional wall motion abnormalities. The left ventricular internal cavity size was normal in size. There is  no left ventricular hypertrophy. Left ventricular diastolic parameters were normal. Right Ventricle: The right ventricular size is normal. No increase in right ventricular wall thickness. Right ventricular systolic function is normal. There is normal pulmonary artery systolic pressure. The tricuspid regurgitant velocity is 2.13 m/s, and  with an assumed right atrial pressure of 3 mmHg, the estimated  right ventricular systolic pressure is 123456 mmHg. Left Atrium: Left atrial size was normal in size. Right Atrium: Right atrial size was normal in size. Pericardium: There is no evidence of pericardial effusion. Mitral Valve: The mitral valve is grossly normal. No evidence of mitral valve regurgitation. No evidence of mitral valve stenosis. Tricuspid Valve:  The tricuspid valve is grossly normal. Tricuspid valve regurgitation is not demonstrated. No evidence of tricuspid stenosis. Aortic Valve: The aortic valve is tricuspid. Aortic valve regurgitation is not visualized. No aortic stenosis is present. Aortic valve mean gradient measures 8.0 mmHg. Aortic valve peak gradient measures 15.1 mmHg. Aortic valve area, by VTI measures 1.73  cm. Pulmonic Valve: The pulmonic valve was grossly normal. Pulmonic valve regurgitation is trivial. No evidence of pulmonic stenosis. Aorta: The aortic root is normal in size and structure. Venous: The inferior vena cava is normal in size with greater than 50% respiratory variability, suggesting right atrial pressure of 3 mmHg. IAS/Shunts: The atrial septum is grossly normal.  LEFT VENTRICLE PLAX 2D LVIDd:         5.10 cm      Diastology LVIDs:         3.20 cm      LV e' medial:    7.51 cm/s LV PW:         0.90 cm      LV E/e' medial:  8.7 LV IVS:        0.90 cm      LV e' lateral:   10.30 cm/s LVOT diam:     2.00 cm      LV E/e' lateral: 6.4 LV SV:         50 LV SV Index:   28 LVOT Area:     3.14 cm  LV Volumes (MOD) LV vol d, MOD A2C: 96.7 ml LV vol d, MOD A4C: 152.0 ml LV vol s, MOD A2C: 34.0 ml LV vol s, MOD A4C: 47.4 ml LV SV MOD A2C:     62.7 ml LV SV MOD A4C:     152.0 ml LV SV MOD BP:      80.0 ml RIGHT VENTRICLE RV Basal diam:  3.50 cm RV Mid diam:    2.80 cm LEFT ATRIUM             Index        RIGHT ATRIUM           Index LA diam:        3.80 cm 2.14 cm/m   RA Area:     13.70 cm LA Vol (A2C):   51.4 ml 28.93 ml/m  RA Volume:   30.60 ml  17.22 ml/m LA Vol (A4C):   41.9 ml  23.58 ml/m LA Biplane Vol: 47.1 ml 26.51 ml/m  AORTIC VALVE                     PULMONIC VALVE AV Area (Vmax):    1.79 cm      PV Vmax:          1.19 m/s AV Area (Vmean):   1.70 cm      PV Peak grad:     5.7 mmHg AV Area (VTI):     1.73 cm      PR End Diast Vel: 4.75 msec AV Vmax:           194.50 cm/s AV Vmean:          129.000 cm/s AV VTI:            0.288 m AV Peak Grad:      15.1 mmHg AV Mean Grad:      8.0 mmHg LVOT Vmax:         111.00 cm/s LVOT Vmean:        69.800 cm/s LVOT VTI:  0.159 m LVOT/AV VTI ratio: 0.55  AORTA Ao Root diam: 3.60 cm MITRAL VALVE               TRICUSPID VALVE MV Area (PHT): 5.09 cm    TR Peak grad:   18.1 mmHg MV Decel Time: 149 msec    TR Vmax:        213.00 cm/s MV E velocity: 65.65 cm/s MV A velocity: 75.15 cm/s  SHUNTS MV E/A ratio:  0.87        Systemic VTI:  0.16 m                            Systemic Diam: 2.00 cm Eleonore Chiquito MD Electronically signed by Eleonore Chiquito MD Signature Date/Time: 05/27/2022/12:36:31 PM    Final    DG CHEST PORT 1 VIEW  Result Date: 05/25/2022 CLINICAL DATA:  Hypoxia EXAM: PORTABLE CHEST 1 VIEW COMPARISON:  X-ray 05/22/2022 FINDINGS: Underinflation with enlarged cardiopericardial silhouette and new vascular congestion. No pneumothorax or effusion. No consolidation. Overlapping cardiac leads. Film is rotated. IMPRESSION: Enlarged cardiopericardial silhouette with new vascular congestion. Underinflated x-ray Electronically Signed   By: Jill Side M.D.   On: 05/25/2022 14:06   CT Abdomen Pelvis W Contrast  Result Date: 05/22/2022 CLINICAL DATA:  Abdominal pain, right flank hematoma. Multiple bruises on body from following. EXAM: CT ABDOMEN AND PELVIS WITH CONTRAST TECHNIQUE: Multidetector CT imaging of the abdomen and pelvis was performed using the standard protocol following bolus administration of intravenous contrast. RADIATION DOSE REDUCTION: This exam was performed according to the departmental dose-optimization program which  includes automated exposure control, adjustment of the mA and/or kV according to patient size and/or use of iterative reconstruction technique. CONTRAST:  172m OMNIPAQUE IOHEXOL 300 MG/ML  SOLN COMPARISON:  08/06/2012, 12/25/2021. FINDINGS: Lower chest: The heart is normal in size and coronary artery calcifications are noted. The lung bases are clear. Hepatobiliary: The liver has a nodular contour, compatible with underlying cirrhosis. Fatty infiltration of the liver is noted. There is a circumscribed heterogenous mass in the anterior right lobe of the liver measuring 4.4 cm. There is a 1.6 cm hypervascular focus in the left lobe of the liver, hepatic segment 4B. No biliary ductal dilatation. The gallbladder is without stones. Pancreas: Unremarkable. No pancreatic ductal dilatation or surrounding inflammatory changes. Spleen: Spleen is enlarged measuring 17.3 cm.  No focal abnormality. Adrenals/Urinary Tract: No adrenal nodule or mass. The kidney enhance symmetrically. No renal calculus or hydronephrosis. The bladder is unremarkable. Stomach/Bowel: Moderate hiatal hernia. Esophageal varices are noted. No obstruction, free air or pneumatosis. Appendix appears normal. No focal bowel wall thickening or surrounding inflammatory changes. Vascular/Lymphatic: Aortic atherosclerosis. The portal vein, splenic vein, and superior mesenteric vein are patent. Esophageal varices are present. A few scattered varices are noted in the mid right abdomen. No abdominal or pelvic lymphadenopathy by size criteria. Reproductive: Prostate is unremarkable. Other: A trace amount of ascites is noted in the pelvis on the right. Musculoskeletal: No acute or suspicious osseous abnormality. Subcutaneous fat stranding is present over the right flank, compatible with known contusion. IMPRESSION: 1. Subcutaneous fat stranding over the right flank, compatible with history of contusion. No acute fracture is seen. 2. Morphologic changes of cirrhosis  and portal hypertension with hepatic steatosis. 3. Heterogeneous mass in the anterior right lobe of the liver measuring 4.4 cm and hypervascular lesion in the left lobe of the liver measuring 1.6 cm, previously characterized by MRI as  concerning for hepatocellular carcinoma. Correlation with prior biopsy results is recommended. 4. Moderate hiatal hernia. 5. Aortic atherosclerosis and coronary artery calcifications. Electronically Signed   By: Brett Fairy M.D.   On: 05/22/2022 21:28   CT Head Wo Contrast  Result Date: 05/22/2022 CLINICAL DATA:  Mental status change, unknown cause EXAM: CT HEAD WITHOUT CONTRAST TECHNIQUE: Contiguous axial images were obtained from the base of the skull through the vertex without intravenous contrast. RADIATION DOSE REDUCTION: This exam was performed according to the departmental dose-optimization program which includes automated exposure control, adjustment of the mA and/or kV according to patient size and/or use of iterative reconstruction technique. COMPARISON:  Head CT 03/10/2022 FINDINGS: Brain: No intracranial hemorrhage, mass effect, or midline shift. No hydrocephalus. The basilar cisterns are patent. Stable chronic small vessel ischemic change. Stable atrophy. No evidence of territorial infarct or acute ischemia. No extra-axial or intracranial fluid collection. Vascular: Atherosclerosis of skullbase vasculature without hyperdense vessel or abnormal calcification. Skull: No fracture or focal lesion. Sinuses/Orbits: No acute findings. Chronic opacification of a few left mastoid air cells. Other: None. IMPRESSION: 1. No acute intracranial abnormality. 2. Stable atrophy and chronic small vessel ischemic change. Electronically Signed   By: Keith Rake M.D.   On: 05/22/2022 21:08   DG Chest Port 1 View  Result Date: 05/22/2022 CLINICAL DATA:  Weakness EXAM: PORTABLE CHEST 1 VIEW COMPARISON:  12/24/2021 FINDINGS: The heart size and mediastinal contours are within normal  limits. Both lungs are clear. The visualized skeletal structures are unremarkable. IMPRESSION: No active disease. Electronically Signed   By: Fidela Salisbury M.D.   On: 05/22/2022 19:08     I spent 60 minutes for this patient encounter including review of prior medical records, coordination of care with primary/other specialist with greater than 50% of time being face to face/counseling and discussing diagnostics/treatment plan with the patient/family.  Electronically signed by:   Rosiland Oz, MD Infectious Disease Physician [facility] [place] [facility] Pager: [phone]

## 2022-05-30 NOTE — Progress Notes (Signed)

## 2022-05-30 NOTE — Progress Notes (Signed)
  Progress Note   Patient: [name] [id number] DOB: 06-Jan-1971 DOA: 05/22/2022     7 DOS: the patient was seen and examined on 05/30/2022 at 8:09AM      Brief hospital course: [name] is a 52 y.o. 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  In the ER, found to have worse pancytopenia.    Subsequently found to have MSSA bacteremia.     Assessment and Plan: * Other pancytopenia (HCC) Anemia, normocytic Leukopenia, neutropenia Severe chronic thrombocytopenia Improving with treatment of his infection, alcohol cessation.  White blood cell count slightly up, hemoglobin stable, platelets up to 32   MSSA bacteremia Repeat cultures no growth to date - Continue Ancef - Place midline - Plan for 2 weeks IV antibiotics followed by 2 weeks linezolid   Alcohol withdrawal with complication with inpatient treatment ([place]) No symptoms of alcohol withdrawal - Finish Librium taper - Start naltrexone at d/c  - Continue thiamine and folate  Liver mass - Will consider repeat MRI liver prior to discharge      Benign prostatic hyperplasia (BPH) with straining on urination - Continue Flomax  DM (diabetes mellitus), type 2 (HCC) Glucose labile - Stop metformin - Continue Semglee - Continue sliding scale corrections    Hypertension Amlodipine, stopped. BP normal - Continue furosemide, spironolactone  Cirrhosis of liver (HCC) Actively drinking at the time of admission - Continue furosemide, spironolactone - Continue lactulose            Subjective: No fever, no confusion, no alcohol withdrawal symptoms, no nursing concerns.  No new bruising, no epistaxis or gums bleeding, no hematuria or rectal bleeding.    Physical Exam: BP (!) 142/95 (BP Location: Right Arm)   Pulse 87   Temp 98 F (36.7 C) (Oral)   Resp 14   Ht 5\' 7"  (1.702 m)   Wt 70.6 kg   SpO2 100%   BMI 24.38 kg/m   Thin adult male,  lying in bed, interactive and appropriate, disheveled RRR, no murmurs, no peripheral edema Respiratory rate normal, lungs clear without rales or wheezes Abdomen soft without tenderness palpation or guarding, no ascites or distention Attention normal, affect appropriate, judgment insight appear normal    Data Reviewed: Discussed with infectious disease CBC shows blood counts improving Metabolic panel unremarkable      Disposition: Status is: Inpatient Stabilizing, if we had a plan for his posthospital antibiotics, he could potentially discharge tomorrow        Author: Edwin Dada, MD 05/30/2022 11:57 AM  For on call review [email or web address].

## 2022-05-31 ENCOUNTER — Other Ambulatory Visit (HOSPITAL_COMMUNITY): Payer: Self-pay

## 2022-05-31 DIAGNOSIS — R7881 Bacteremia: Secondary | ICD-10-CM | POA: Diagnosis not present

## 2022-05-31 LAB — CBC
HCT: 32.8 % — ABNORMAL LOW (ref 39.0–52.0)
Hemoglobin: 10.1 g/dL — ABNORMAL LOW (ref 13.0–17.0)
MCH: 26.6 pg (ref 26.0–34.0)
MCHC: 30.8 g/dL (ref 30.0–36.0)
MCV: 86.5 fL (ref 80.0–100.0)
Platelets: 42 10*3/uL — ABNORMAL LOW (ref 150–400)
RBC: 3.79 MIL/uL — ABNORMAL LOW (ref 4.22–5.81)
RDW: 20.6 % — ABNORMAL HIGH (ref 11.5–15.5)
WBC: 2.6 10*3/uL — ABNORMAL LOW (ref 4.0–10.5)
nRBC: 0 % (ref 0.0–0.2)

## 2022-05-31 LAB — BASIC METABOLIC PANEL
Anion gap: 7 (ref 5–15)
BUN: 8 mg/dL (ref 6–20)
CO2: 28 mmol/L (ref 22–32)
Calcium: 8.3 mg/dL — ABNORMAL LOW (ref 8.9–10.3)
Chloride: 96 mmol/L — ABNORMAL LOW (ref 98–111)
Creatinine, Ser: 0.68 mg/dL (ref 0.61–1.24)
GFR, Estimated: >60 mL/min (ref >60)
Glucose, Bld: 178 mg/dL — ABNORMAL HIGH (ref 70–99)
Potassium: 4 mmol/L (ref 3.5–5.1)
Sodium: 131 mmol/L — ABNORMAL LOW (ref 135–145)

## 2022-05-31 LAB — GLUCOSE, CAPILLARY
Glucose-Capillary: 154 mg/dL — ABNORMAL HIGH (ref 70–99)
Glucose-Capillary: 206 mg/dL — ABNORMAL HIGH (ref 70–99)

## 2022-05-31 MED ORDER — PANTOPRAZOLE SODIUM 40 MG PO TBEC
40.0000 mg | DELAYED_RELEASE_TABLET | Freq: Two times a day (BID) | ORAL | Status: DC
  Administered 2022-05-31: 40 mg via ORAL
  Filled 2022-05-31: qty 1

## 2022-05-31 MED ORDER — THIAMINE HCL 100 MG PO TABS
100.0000 mg | ORAL_TABLET | Freq: Every day | ORAL | 3 refills | Status: DC
  Filled 2022-05-31: qty 30, 30d supply, fill #0

## 2022-05-31 MED ORDER — PEN NEEDLES 31G X 5 MM MISC
1.0000 | Freq: Three times a day (TID) | 0 refills | Status: DC
  Filled 2022-05-31: qty 100, 30d supply, fill #0

## 2022-05-31 MED ORDER — FUROSEMIDE 20 MG PO TABS
20.0000 mg | ORAL_TABLET | Freq: Every day | ORAL | 3 refills | Status: DC
  Filled 2022-05-31: qty 30, 30d supply, fill #0
  Filled 2022-06-26: qty 30, 30d supply, fill #1

## 2022-05-31 MED ORDER — LACTULOSE 10 GM/15ML PO SOLN
30.0000 g | Freq: Every day | ORAL | 0 refills | Status: DC
  Filled 2022-05-31: qty 236, 5d supply, fill #0

## 2022-05-31 MED ORDER — SPIRONOLACTONE 25 MG PO TABS
25.0000 mg | ORAL_TABLET | Freq: Every day | ORAL | 3 refills | Status: DC
  Filled 2022-05-31: qty 30, 30d supply, fill #0
  Filled 2022-06-26: qty 30, 30d supply, fill #1

## 2022-05-31 MED ORDER — INSULIN GLARGINE 100 UNIT/ML SOLOSTAR PEN
8.0000 [IU] | PEN_INJECTOR | Freq: Every day | SUBCUTANEOUS | 0 refills | Status: DC
  Filled 2022-05-31: qty 15, 30d supply, fill #0

## 2022-05-31 NOTE — Discharge Summary (Signed)
Physician Discharge Summary   Patient: [name] MRN: [id number] DOB: Jul 18, 1970  Admit date:     05/22/2022  Discharge date: 05/31/22  Discharge Physician: Edwin Dada   PCP: [name], MD     Recommendations at discharge:  Follow up with [facility] for outpatient antibiotics for MSSA bacteremia [facility]:  Please check CBC/D and BMP weekly and fax to Dr. West Bali and Dr. Burr Medico Please remove PICC at end of 4 weeks therapy, end of treatment Apr 3  Follow up with Hematology Dr. Burr Medico in 1 week Follow up with Gastroenterology Dr. Therisa Doyne as previously planned for follow up cirrhosis and liver mass      Discharge Diagnoses: Principal Problem:   Other pancytopenia ([place]) Active Problems:   MSSA bacteremia   Alcohol withdrawal with complication with inpatient treatment ([place])   Liver mass   Cirrhosis of liver ([place])   Hypertension   Hypokalemia   Hypomagnesemia   DM (diabetes mellitus), type 2 ([place])   Benign prostatic hyperplasia (BPH) with straining on urination   History of eye trauma   Hematoma of right flank   Hypophosphatemia   Hyponatremia   Hypoxia   UTI (urinary tract infection)      Hospital Course: [name] is a 52 y.o. 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  In the ER, found to have worse pancytopenia, alcohol withdrawal.    After admission, developed fever, found to have MSSA bacteremia.     * Other pancytopenia (H[facility]) Anemia, normocytic Leukopenia, neutropenia Severe chronic thrombocytopenia Chronic.  Due to alcohol.  Here, his platelets and anemia were worse due to bacteremia, probably worse in setting of bacteremia, alcohol poisoning.  Heme consulted, transfused 2 units of platelets 05/23/2022, 1u 3/2, 1u 3/3.  Since then, no clinical bleeding, Plts >20.   ANC down to 500 today. - Continue PPI - Transfuse for platelet count < 20K due to bacteremia - Consult Hematology,  appreciate expertise    Severe sepsis due to MSSA bacteremia After admission, patient developed fever, tachycardia to 120s, leukopenia with platelets dropping <10 requiring several platelet transfusions, acute drop likely due to infection.    Was noted to have purulence around a peripheral IV, possibly this was peripheral IV related, (not central line related) and possibly it was community acquired.  TTE was normal.  TEE not possible due to thrombocytopenia.  Treated with Ancef.  Repeat cultures negative.  Midline placed and discharged to complete 4 weeks total IV antibiotics until 4/3 (see below)       Alcohol withdrawal with complication with inpatient treatment [facility]) This was actually the chief presenting complaint.  Presented with fall, found to be in withdrawal, desired detox.  Treated with Librium taper and had mild alcohol withdrawal symptoms, resolved by discharge.  Alcohol treatment resources provided, patient emphatic he will never drink again.     Liver mass Stable 4.4cm liver mass.  Biopsied in Oct 2023, necrotic cells.  Repeat biopsy precluded by platelets at this time.  Follow up with GI for further management of liver mass   UTI (urinary tract infection) Seeded by staph bacteremia  Hypoxia Respiratory failure ruled out.  Off of oxygen now.  COVID and flu negative.   Hematoma of right flank Mild, due to thrombocytopenia, falls.  History of eye trauma Hx right eye injury March 2023, with dilated pupil and poor vision since then  DM (diabetes mellitus), type 2 ([place]) Metformin stopped at discharge, discharged on  new Insulin glargine.  Cirrhosis of liver ([place]) Discharged on new diuretics, lactulose.            The [facility] Controlled Substances Registry was reviewed for this patient prior to discharge.   Consultants: Infectious disease Hematology    Disposition: Home with [facility] Diet recommendation:  Carb modified diet  DISCHARGE  MEDICATION: Allergies as of 05/31/2022   No Known Allergies      Medication List     STOP taking these medications    fexofenadine-pseudoephedrine 60-120 MG 12 hr tablet Commonly known as: ALLEGRA-D   metFORMIN 500 MG 24 hr tablet Commonly known as: GLUCOPHAGE-XR       TAKE these medications    Basaglar KwikPen 100 UNIT/ML Inject 8 Units into the skin daily.   ceFAZolin  IVPB Commonly known as: ANCEF Inject 2 g into the vein every 8 (eight) hours for 26 days. Indication:  MSSA thrombophlebitis First Dose: Yes Last Day of Therapy:  06/25/22 Labs - Once weekly:  CBC/D and BMP, Labs - Every other week:  ESR and CRP Method of administration: IV Push Pull PICC line at the completion of IV therapy Method of administration may be changed at the discretion of home infusion pharmacist based upon assessment of the patient and/or caregiver's ability to self-administer the medication ordered.   Constulose 10 GM/15ML solution Generic drug: lactulose Take 45 mLs (30 g total) by mouth daily. Start taking on: March 10, 123456   folic acid 1 MG tablet Commonly known as: FOLVITE TAKE 1 TABLET BY MOUTH EVERY DAY   furosemide 20 MG tablet Commonly known as: LASIX Take 1 tablet (20 mg total) by mouth daily. Start taking on: June 01, 2022   mirtazapine 15 MG tablet Commonly known as: Remeron Take 1 tablet (15 mg total) by mouth at bedtime.   pantoprazole 40 MG tablet Commonly known as: PROTONIX Take 1 tablet (40 mg total) by mouth 2 (two) times daily before a meal.   spironolactone 25 MG tablet Commonly known as: ALDACTONE Take 1 tablet (25 mg total) by mouth daily. Start taking on: June 01, 2022   tamsulosin 0.4 MG Caps capsule Commonly known as: FLOMAX Take 1 capsule (0.4 mg total) by mouth daily after supper.   TechLite Pen Needles 31G X 5 MM Misc Generic drug: Insulin Pen Needle Use 3 (three) times daily.   thiamine 100 MG tablet Commonly known as: VITAMIN B1 Take 1  tablet (100 mg total) by mouth daily.   traZODone 150 MG tablet Commonly known as: DESYREL Take 150 mg by mouth at bedtime.               Durable Medical Equipment  (From admission, onward)           Start     Ordered   05/29/22 1454  For home use only DME Cane  Once        05/29/22 1454              Discharge Care Instructions  (From admission, onward)           Start     Ordered   05/30/22 0000  Change dressing on IV access line weekly and PRN  (Home infusion instructions - [facility] )        05/30/22 1501            Follow-up Information     [facility] Follow up.   Why: [facility] will provide IV antibiotics in the  home after discharge.        [facility] Follow up.   Why: [facility] will provide weekly nursing in the home for PICC line maintenance and labs.        Truitt Merle, MD. Schedule an appointment as soon as possible for a visit in 1 week(s).   Specialties: Hematology, Oncology Contact information: [place] [address] [phone]         Ronnette Juniper, MD. Call.   Specialty: Gastroenterology Contact information: [place] [address] [phone]                 Discharge Instructions     [facility] pharmacist to adjust dose for Vancomycin, Aminoglycosides and other anti-infective therapies as requested by physician.   Complete by: As directed    [facility] to provide Cath Flo '2mg'$    Complete by: As directed    Administer for PICC line occlusion and as ordered by physician for other access device issues.   Anaphylaxis Kit: Provided to treat any anaphylactic reaction to the medication being provided to the patient if First Dose or when requested by physician   Complete by: As directed    Epinephrine '1mg'$ /ml vial / amp: Administer 0.'3mg'$  (0.62m) subcutaneously once for moderate to severe anaphylaxis, nurse to call physician and pharmacy  when reaction occurs and call 911 if needed for immediate care   Diphenhydramine '50mg'$ /ml IV vial: Administer 25-'50mg'$  IV/IM PRN for first dose reaction, rash, itching, mild reaction, nurse to call physician and pharmacy when reaction occurs   Sodium Chloride 0.9% NS 5025mIV: Administer if needed for hypovolemic blood pressure drop or as ordered by physician after call to physician with anaphylactic reaction   Change dressing on IV access line weekly and PRN   Complete by: As directed    Discharge instructions   Complete by: As directed    **IMPORTANT DISCHARGE INSTRUCTIONS**   From Dr. DaLoleta BooksYou were admitted for alcohol withdrawal  Here, you were found to have a bloodstream infection with staph  Thankfully, the alcohol withdawal resolved  For the bacteria in the bloodstream, you were treated with antibiotics and should complete 4 weeks IV treatment  Take cefazolin 2g three times daily (every 8 hours) until April 3  Go see Dr. MaWest BaliInfectious Disease) on April 2 at the appointment time listed below Call their office for questions  The home health agency will draw labs and fax them to Dr. MaJovita Gammaeekly  Go see Dr. FeBurr Medicon 1 week  Ask either Dr. FeBurr Medicor Dr. KaTherisa Doyneo repeat the MRI of the liver mass you have, or to arrange plans for next steps for evaluating this.   For medicines, you have 4 new medicines: Furosemide and spironolactone are diuretics to keep off extra fluid Take them daily  Lactulose makes you have a bowel movement to get rid of ammonia  Lastly, stop metformin STart insulin glargine injected 8 units once daily If you have trouble with insulin, call your primary doctor for alternatives   Flush IV access with Sodium Chloride 0.9% and Heparin 10 units/ml or 100 units/ml   Complete by: As directed    Home infusion instructions - [facility]   Complete by: As directed    Instructions: Flush IV access with Sodium Chloride 0.9% and Heparin  10units/ml or 100units/ml   Change dressing on IV access line: Weekly and PRN   Instructions Cath Flo '2mg'$ : Administer for  PICC Line occlusion and as ordered by physician for other access device   [facility] pharmacist to adjust dose for: Vancomycin, Aminoglycosides and other anti-infective therapies as requested by physician   Increase activity slowly   Complete by: As directed    Method of administration may be changed at the discretion of home infusion pharmacist based upon assessment of the patient and/or caregiver's ability to self-administer the medication ordered   Complete by: As directed    No wound care   Complete by: As directed        Discharge Exam: Filed Weights   05/29/22 0500 05/30/22 0500 05/31/22 0615  Weight: 68.7 kg 70.6 kg 70.1 kg    General: Pt is alert, awake, not in acute distress Cardiovascular: RRR, nl S1-S2, no murmurs appreciated.   No LE edema.   Respiratory: Normal respiratory rate and rhythm.  CTAB without rales or wheezes. Abdominal: Abdomen soft and non-tender.  No distension or HSM.   Neuro/Psych: Strength symmetric in upper and lower extremities.  Judgment and insight appear normal.   Condition at discharge: good  The results of significant diagnostics from this hospitalization (including imaging, microbiology, ancillary and laboratory) are listed below for reference.   Imaging Studies: CT FOREARM RIGHT W CONTRAST  Addendum Date: 05/28/2022   ADDENDUM REPORT: 05/28/2022 03:12 ADDENDUM: There is a 4.8 cm mass in the right lobe of the liver which has been seen on prior imaging of the abdomen and pelvis most recently 05/22/2022 and was biopsied by ultrasound guidance on 12/27/2021. Correlate clinically with prior biopsy results. This was previously considered suspicious for hepatocellular carcinoma. The liver is cirrhotic and steatotic. Electronically Signed   By: Telford Nab M.D.   On: 05/28/2022 03:12   Result Date: 05/28/2022 CLINICAL  DATA:  Right upper extremity infection, possibly from IV, originating in the antecubital area. EXAM: CT OF THE UPPER RIGHT EXTREMITY WITH CONTRAST (CT RIGHT HUMERUS, CT RIGHT FOREARM COMBINED REPORT) TECHNIQUE: Multidetector CT imaging of the upper right extremity was performed from the shoulder through the wrist according to the standard protocol following intravenous contrast administration, in soft tissue and bone window settings with multiplanar reformatting. RADIATION DOSE REDUCTION: This exam was performed according to the departmental dose-optimization program which includes automated exposure control, adjustment of the mA and/or kV according to patient size and/or use of iterative reconstruction technique. CONTRAST:  158m OMNIPAQUE IOHEXOL 300 MG/ML  SOLN COMPARISON:  None Available. FINDINGS: Bones/Joint/Cartilage There is normal bone mineralization without evidence of acute fractures, primarily pathologic bone lesions or acute osteomyelitis from the right shoulder through the wrist. A nondisplaced chip fracture is noted of lateral edge of the coronoid process of the ulna, but is believed chronic due to lack of a joint effusion. There are minor subcortical cystic changes scattered along the humeral rotator cuff insertion, but no findings of chronic rotator cuff arthropathy or through and through rotator cuff tears. There is narrowing and mild spurring at the [facility]. There is trace spurring of the inferior glenohumeral joint and the medial aspect of the trochleoulnar joint. There is mild narrowing of the triscaphe and first CMC joints. There are no findings of erosive arthropathy throughout. Enthesopathic spurring of the lateral humeral epicondyle is incidentally seen. Ligaments Suboptimally assessed by CT. Muscles and Tendons There is normal muscle bulk throughout the imaged right upper extremity. No intramuscular masses or fluid collections are seen. Area tendons are not well evaluated with this  technique but are grossly intact as far as  visualized. Soft tissues The contrast bolus phase in which the right upper extremity was imaged was predominantly the venous phase. Large arteries of the right upper extremity opacify well at least the elbow but are not well seen in the forearm and wrist, probably due to the bolus phase. There is a short-segment of high-grade narrowing due to thrombosis in the antecubital vein at and just above the level of the elbow crease, surrounded by subcutaneous edema which may indicate thrombophlebitis/cellulitis. There is no underlying abscess. Remainder of the right upper extremity veins opacify well. There is no evidence of soft tissue gas or visible deep soft tissue abnormality which would be suspicious for acute necrotizing fasciitis, but lack of CT findings of this process should not delay surgery in the presence of clinical suspicion. IMPRESSION: 1. Short-segment of high-grade narrowing due to thrombosis, in the antecubital vein at and just above the level of the elbow crease, surrounded by subcutaneous edema which may indicate thrombophlebitis/cellulitis. 2. No evidence of soft tissue gas or deep soft tissue abnormality which would be suspicious for acute necrotizing fasciitis, but lack of CT findings of this process should not delay surgery in the presence of clinical suspicion. 3. No evidence of acute fractures, primarily pathologic bone lesions or acute osteomyelitis from the right shoulder through the wrist. 4. Degenerative changes described above. 5. Nondisplaced chip fracture of the lateral edge of the coronoid process of the ulna, believed chronic due to the lack of a joint effusion. 6. Enthesopathic spurring of the lateral humeral epicondyle. Electronically Signed: By: Telford Nab M.D. On: 05/27/2022 21:32   CT HUMERUS RIGHT W CONTRAST  Addendum Date: 05/28/2022   ADDENDUM REPORT: 05/28/2022 03:12 ADDENDUM: There is a 4.8 cm mass in the right lobe of the liver  which has been seen on prior imaging of the abdomen and pelvis most recently 05/22/2022 and was biopsied by ultrasound guidance on 12/27/2021. Correlate clinically with prior biopsy results. This was previously considered suspicious for hepatocellular carcinoma. The liver is cirrhotic and steatotic. Electronically Signed   By: Telford Nab M.D.   On: 05/28/2022 03:12   Result Date: 05/28/2022 CLINICAL DATA:  Right upper extremity infection, possibly from IV, originating in the antecubital area. EXAM: CT OF THE UPPER RIGHT EXTREMITY WITH CONTRAST (CT RIGHT HUMERUS, CT RIGHT FOREARM COMBINED REPORT) TECHNIQUE: Multidetector CT imaging of the upper right extremity was performed from the shoulder through the wrist according to the standard protocol following intravenous contrast administration, in soft tissue and bone window settings with multiplanar reformatting. RADIATION DOSE REDUCTION: This exam was performed according to the departmental dose-optimization program which includes automated exposure control, adjustment of the mA and/or kV according to patient size and/or use of iterative reconstruction technique. CONTRAST:  169m OMNIPAQUE IOHEXOL 300 MG/ML  SOLN COMPARISON:  None Available. FINDINGS: Bones/Joint/Cartilage There is normal bone mineralization without evidence of acute fractures, primarily pathologic bone lesions or acute osteomyelitis from the right shoulder through the wrist. A nondisplaced chip fracture is noted of lateral edge of the coronoid process of the ulna, but is believed chronic due to lack of a joint effusion. There are minor subcortical cystic changes scattered along the humeral rotator cuff insertion, but no findings of chronic rotator cuff arthropathy or through and through rotator cuff tears. There is narrowing and mild spurring at the [facility]. There is trace spurring of the inferior glenohumeral joint and the medial aspect of the trochleoulnar joint. There is mild narrowing of the  triscaphe and first [facility]  joints. There are no findings of erosive arthropathy throughout. Enthesopathic spurring of the lateral humeral epicondyle is incidentally seen. Ligaments Suboptimally assessed by CT. Muscles and Tendons There is normal muscle bulk throughout the imaged right upper extremity. No intramuscular masses or fluid collections are seen. Area tendons are not well evaluated with this technique but are grossly intact as far as visualized. Soft tissues The contrast bolus phase in which the right upper extremity was imaged was predominantly the venous phase. Large arteries of the right upper extremity opacify well at least the elbow but are not well seen in the forearm and wrist, probably due to the bolus phase. There is a short-segment of high-grade narrowing due to thrombosis in the antecubital vein at and just above the level of the elbow crease, surrounded by subcutaneous edema which may indicate thrombophlebitis/cellulitis. There is no underlying abscess. Remainder of the right upper extremity veins opacify well. There is no evidence of soft tissue gas or visible deep soft tissue abnormality which would be suspicious for acute necrotizing fasciitis, but lack of CT findings of this process should not delay surgery in the presence of clinical suspicion. IMPRESSION: 1. Short-segment of high-grade narrowing due to thrombosis, in the antecubital vein at and just above the level of the elbow crease, surrounded by subcutaneous edema which may indicate thrombophlebitis/cellulitis. 2. No evidence of soft tissue gas or deep soft tissue abnormality which would be suspicious for acute necrotizing fasciitis, but lack of CT findings of this process should not delay surgery in the presence of clinical suspicion. 3. No evidence of acute fractures, primarily pathologic bone lesions or acute osteomyelitis from the right shoulder through the wrist. 4. Degenerative changes described above. 5. Nondisplaced chip fracture  of the lateral edge of the coronoid process of the ulna, believed chronic due to the lack of a joint effusion. 6. Enthesopathic spurring of the lateral humeral epicondyle. Electronically Signed: By: Telford Nab M.D. On: 05/27/2022 21:32   ECHOCARDIOGRAM COMPLETE  Result Date: 05/27/2022    ECHOCARDIOGRAM REPORT   Patient Name:   PRAJWAL ZURICK Date of Exam: 05/27/2022 Medical Rec #:  [id number]            Height:       67.0 in Accession #:    [id number]           Weight:       147.5 lb Date of Birth:  [birth year]            BSA:          1.777 m Patient Age:    18 years             BP:           126/72 mmHg Patient Gender: M                    HR:           100 bpm. Exam Location:  Inpatient Procedure: 2D Echo Indications:    bacteremia  History:        Patient has no prior history of Echocardiogram examinations.                 Risk Factors:Diabetes and Hypertension.  Sonographer:    Harvie Junior Referring Phys: [place]  1. Left ventricular ejection fraction, by estimation, is 60 to 65%. The left ventricle has normal function. The left ventricle has no regional wall motion abnormalities. Left ventricular diastolic parameters  were normal.  2. Right ventricular systolic function is normal. The right ventricular size is normal. There is normal pulmonary artery systolic pressure. The estimated right ventricular systolic pressure is 123456 mmHg.  3. The mitral valve is grossly normal. No evidence of mitral valve regurgitation. No evidence of mitral stenosis.  4. The aortic valve is tricuspid. Aortic valve regurgitation is not visualized. No aortic stenosis is present.  5. The inferior vena cava is normal in size with greater than 50% respiratory variability, suggesting right atrial pressure of 3 mmHg. Conclusion(s)/Recommendation(s): No evidence of valvular vegetations on this transthoracic echocardiogram. Consider a transesophageal echocardiogram to exclude infective endocarditis if  clinically indicated. FINDINGS  Left Ventricle: Left ventricular ejection fraction, by estimation, is 60 to 65%. The left ventricle has normal function. The left ventricle has no regional wall motion abnormalities. The left ventricular internal cavity size was normal in size. There is  no left ventricular hypertrophy. Left ventricular diastolic parameters were normal. Right Ventricle: The right ventricular size is normal. No increase in right ventricular wall thickness. Right ventricular systolic function is normal. There is normal pulmonary artery systolic pressure. The tricuspid regurgitant velocity is 2.13 m/s, and  with an assumed right atrial pressure of 3 mmHg, the estimated right ventricular systolic pressure is 123456 mmHg. Left Atrium: Left atrial size was normal in size. Right Atrium: Right atrial size was normal in size. Pericardium: There is no evidence of pericardial effusion. Mitral Valve: The mitral valve is grossly normal. No evidence of mitral valve regurgitation. No evidence of mitral valve stenosis. Tricuspid Valve: The tricuspid valve is grossly normal. Tricuspid valve regurgitation is not demonstrated. No evidence of tricuspid stenosis. Aortic Valve: The aortic valve is tricuspid. Aortic valve regurgitation is not visualized. No aortic stenosis is present. Aortic valve mean gradient measures 8.0 mmHg. Aortic valve peak gradient measures 15.1 mmHg. Aortic valve area, by VTI measures 1.73  cm. Pulmonic Valve: The pulmonic valve was grossly normal. Pulmonic valve regurgitation is trivial. No evidence of pulmonic stenosis. Aorta: The aortic root is normal in size and structure. Venous: The inferior vena cava is normal in size with greater than 50% respiratory variability, suggesting right atrial pressure of 3 mmHg. IAS/Shunts: The atrial septum is grossly normal.  LEFT VENTRICLE PLAX 2D LVIDd:         5.10 cm      Diastology LVIDs:         3.20 cm      LV e' medial:    7.51 cm/s LV PW:         0.90  cm      LV E/e' medial:  8.7 LV IVS:        0.90 cm      LV e' lateral:   10.30 cm/s LVOT diam:     2.00 cm      LV E/e' lateral: 6.4 LV SV:         50 LV SV Index:   28 LVOT Area:     3.14 cm  LV Volumes (MOD) LV vol d, MOD A2C: 96.7 ml LV vol d, MOD A4C: 152.0 ml LV vol s, MOD A2C: 34.0 ml LV vol s, MOD A4C: 47.4 ml LV SV MOD A2C:     62.7 ml LV SV MOD A4C:     152.0 ml LV SV MOD BP:      80.0 ml RIGHT VENTRICLE RV Basal diam:  3.50 cm RV Mid diam:    2.80 cm LEFT ATRIUM  Index        RIGHT ATRIUM           Index LA diam:        3.80 cm 2.14 cm/m   RA Area:     13.70 cm LA Vol (A2C):   51.4 ml 28.93 ml/m  RA Volume:   30.60 ml  17.22 ml/m LA Vol (A4C):   41.9 ml 23.58 ml/m LA Biplane Vol: 47.1 ml 26.51 ml/m  AORTIC VALVE                     PULMONIC VALVE AV Area (Vmax):    1.79 cm      PV Vmax:          1.19 m/s AV Area (Vmean):   1.70 cm      PV Peak grad:     5.7 mmHg AV Area (VTI):     1.73 cm      PR End Diast Vel: 4.75 msec AV Vmax:           194.50 cm/s AV Vmean:          129.000 cm/s AV VTI:            0.288 m AV Peak Grad:      15.1 mmHg AV Mean Grad:      8.0 mmHg LVOT Vmax:         111.00 cm/s LVOT Vmean:        69.800 cm/s LVOT VTI:          0.159 m LVOT/AV VTI ratio: 0.55  AORTA Ao Root diam: 3.60 cm MITRAL VALVE               TRICUSPID VALVE MV Area (PHT): 5.09 cm    TR Peak grad:   18.1 mmHg MV Decel Time: 149 msec    TR Vmax:        213.00 cm/s MV E velocity: 65.65 cm/s MV A velocity: 75.15 cm/s  SHUNTS MV E/A ratio:  0.87        Systemic VTI:  0.16 m                            Systemic Diam: 2.00 cm Eleonore Chiquito MD Electronically signed by Eleonore Chiquito MD Signature Date/Time: 05/27/2022/12:36:31 PM    Final    DG CHEST PORT 1 VIEW  Result Date: 05/25/2022 CLINICAL DATA:  Hypoxia EXAM: PORTABLE CHEST 1 VIEW COMPARISON:  X-ray 05/22/2022 FINDINGS: Underinflation with enlarged cardiopericardial silhouette and new vascular congestion. No pneumothorax or effusion. No  consolidation. Overlapping cardiac leads. Film is rotated. IMPRESSION: Enlarged cardiopericardial silhouette with new vascular congestion. Underinflated x-ray Electronically Signed   By: Jill Side M.D.   On: 05/25/2022 14:06   CT Abdomen Pelvis W Contrast  Result Date: 05/22/2022 CLINICAL DATA:  Abdominal pain, right flank hematoma. Multiple bruises on body from following. EXAM: CT ABDOMEN AND PELVIS WITH CONTRAST TECHNIQUE: Multidetector CT imaging of the abdomen and pelvis was performed using the standard protocol following bolus administration of intravenous contrast. RADIATION DOSE REDUCTION: This exam was performed according to the departmental dose-optimization program which includes automated exposure control, adjustment of the mA and/or kV according to patient size and/or use of iterative reconstruction technique. CONTRAST:  154m OMNIPAQUE IOHEXOL 300 MG/ML  SOLN COMPARISON:  08/06/2012, 12/25/2021. FINDINGS: Lower chest: The heart is normal in size and coronary artery calcifications are noted. The lung bases are  clear. Hepatobiliary: The liver has a nodular contour, compatible with underlying cirrhosis. Fatty infiltration of the liver is noted. There is a circumscribed heterogenous mass in the anterior right lobe of the liver measuring 4.4 cm. There is a 1.6 cm hypervascular focus in the left lobe of the liver, hepatic segment 4B. No biliary ductal dilatation. The gallbladder is without stones. Pancreas: Unremarkable. No pancreatic ductal dilatation or surrounding inflammatory changes. Spleen: Spleen is enlarged measuring 17.3 cm.  No focal abnormality. Adrenals/Urinary Tract: No adrenal nodule or mass. The kidney enhance symmetrically. No renal calculus or hydronephrosis. The bladder is unremarkable. Stomach/Bowel: Moderate hiatal hernia. Esophageal varices are noted. No obstruction, free air or pneumatosis. Appendix appears normal. No focal bowel wall thickening or surrounding inflammatory  changes. Vascular/Lymphatic: Aortic atherosclerosis. The portal vein, splenic vein, and superior mesenteric vein are patent. Esophageal varices are present. A few scattered varices are noted in the mid right abdomen. No abdominal or pelvic lymphadenopathy by size criteria. Reproductive: Prostate is unremarkable. Other: A trace amount of ascites is noted in the pelvis on the right. Musculoskeletal: No acute or suspicious osseous abnormality. Subcutaneous fat stranding is present over the right flank, compatible with known contusion. IMPRESSION: 1. Subcutaneous fat stranding over the right flank, compatible with history of contusion. No acute fracture is seen. 2. Morphologic changes of cirrhosis and portal hypertension with hepatic steatosis. 3. Heterogeneous mass in the anterior right lobe of the liver measuring 4.4 cm and hypervascular lesion in the left lobe of the liver measuring 1.6 cm, previously characterized by MRI as concerning for hepatocellular carcinoma. Correlation with prior biopsy results is recommended. 4. Moderate hiatal hernia. 5. Aortic atherosclerosis and coronary artery calcifications. Electronically Signed   By: Brett Fairy M.D.   On: 05/22/2022 21:28   CT Head Wo Contrast  Result Date: 05/22/2022 CLINICAL DATA:  Mental status change, unknown cause EXAM: CT HEAD WITHOUT CONTRAST TECHNIQUE: Contiguous axial images were obtained from the base of the skull through the vertex without intravenous contrast. RADIATION DOSE REDUCTION: This exam was performed according to the departmental dose-optimization program which includes automated exposure control, adjustment of the mA and/or kV according to patient size and/or use of iterative reconstruction technique. COMPARISON:  Head CT 03/10/2022 FINDINGS: Brain: No intracranial hemorrhage, mass effect, or midline shift. No hydrocephalus. The basilar cisterns are patent. Stable chronic small vessel ischemic change. Stable atrophy. No evidence of  territorial infarct or acute ischemia. No extra-axial or intracranial fluid collection. Vascular: Atherosclerosis of skullbase vasculature without hyperdense vessel or abnormal calcification. Skull: No fracture or focal lesion. Sinuses/Orbits: No acute findings. Chronic opacification of a few left mastoid air cells. Other: None. IMPRESSION: 1. No acute intracranial abnormality. 2. Stable atrophy and chronic small vessel ischemic change. Electronically Signed   By: Keith Rake M.D.   On: 05/22/2022 21:08   DG Chest Port 1 View  Result Date: 05/22/2022 CLINICAL DATA:  Weakness EXAM: PORTABLE CHEST 1 VIEW COMPARISON:  12/24/2021 FINDINGS: The heart size and mediastinal contours are within normal limits. Both lungs are clear. The visualized skeletal structures are unremarkable. IMPRESSION: No active disease. Electronically Signed   By: Fidela Salisbury M.D.   On: 05/22/2022 19:08    Microbiology: Results for orders placed or performed during the hospital encounter of 05/22/22  MRSA Next Gen by PCR, Nasal     Status: None   Collection Time: 05/23/22 12:26 AM   Specimen: Nasal Mucosa; Nasal Swab  Result Value Ref Range Status   MRSA by PCR  Next Gen NOT DETECTED NOT DETECTED Final    Comment: (NOTE) The GeneXpert MRSA Assay (FDA approved for NASAL specimens only), is one component of a comprehensive MRSA colonization surveillance program. It is not intended to diagnose MRSA infection nor to guide or monitor treatment for MRSA infections. Test performance is not FDA approved in patients less than 64 years old. Performed at [facility], [place] [address], [place], [place] [address]   Urine Culture (for pregnant, neutropenic or urologic patients or patients with an indwelling urinary catheter)     Status: Abnormal   Collection Time: 05/25/22 12:48 PM   Specimen: Urine, Clean Catch  Result Value Ref Range Status   Specimen Description   Final    URINE, CLEAN CATCH Performed at  [facility], [place] [address], [place], [place] [address]    Special Requests   Final    NONE Performed at [facility], [place] [address] [place], [place] [address]    Culture >=100,000 COLONIES/mL STAPHYLOCOCCUS AUREUS (A)  Final   Report Status 05/27/2022 FINAL  Final   Organism ID, Bacteria STAPHYLOCOCCUS AUREUS (A)  Final      Susceptibility   Staphylococcus aureus - MIC*    CIPROFLOXACIN <=0.5 SENSITIVE Sensitive     GENTAMICIN <=0.5 SENSITIVE Sensitive     NITROFURANTOIN <=16 SENSITIVE Sensitive     OXACILLIN <=0.25 SENSITIVE Sensitive     TETRACYCLINE <=1 SENSITIVE Sensitive     VANCOMYCIN <=0.5 SENSITIVE Sensitive     TRIMETH/SULFA <=10 SENSITIVE Sensitive     CLINDAMYCIN <=0.25 SENSITIVE Sensitive     RIFAMPIN <=0.5 SENSITIVE Sensitive     Inducible Clindamycin NEGATIVE Sensitive     * >=100,000 COLONIES/mL STAPHYLOCOCCUS AUREUS  Culture, blood (Routine X 2) w Reflex to ID Panel     Status: None   Collection Time: 05/25/22  2:25 PM   Specimen: BLOOD  Result Value Ref Range Status   Specimen Description   Final    BLOOD BLOOD RIGHT ARM BLOOD RIGHT HAND Performed at [place] [address], [place], [place] [address]    Special Requests   Final    BOTTLES DRAWN AEROBIC AND ANAEROBIC Blood Culture adequate volume Performed at [place] [address] [place], [place] [address]    Culture   Final    NO GROWTH 5 DAYS Performed at [facility], [place] [address] [place], [place] [address]    Report Status 05/30/2022 FINAL  Final  Culture, blood (Routine X 2) w Reflex to ID Panel     Status: Abnormal   Collection Time: 05/25/22  2:26 PM   Specimen: BLOOD  Result Value Ref Range Status   Specimen Description   Final    BLOOD BLOOD LEFT ARM BLOOD LEFT HAND Performed at [place] [address] [place], [place] [address]    Special Requests   Final    BOTTLES  DRAWN AEROBIC AND ANAEROBIC Blood Culture adequate volume Performed at [place] [address], [place], [place] [address]    Culture  Setup Time   Final    GRAM POSITIVE COCCI IN CLUSTERS IN BOTH AEROBIC AND ANAEROBIC BOTTLES CRITICAL RESULT CALLED TO, READ BACK BY AND VERIFIED WITH:  C/ PHARMD J. GADHIA 05/26/22 0950 A. LAFRANCE Performed at [facility], [place] [address] [place], [place] [address]    Culture STAPHYLOCOCCUS AUREUS (A)  Final   Report Status  05/28/2022 FINAL  Final   Organism ID, Bacteria STAPHYLOCOCCUS AUREUS  Final      Susceptibility   Staphylococcus aureus - MIC*    CIPROFLOXACIN <=0.5 SENSITIVE Sensitive     ERYTHROMYCIN <=0.25 SENSITIVE Sensitive     GENTAMICIN <=0.5 SENSITIVE Sensitive     OXACILLIN 0.5 SENSITIVE Sensitive     TETRACYCLINE <=1 SENSITIVE Sensitive     VANCOMYCIN 1 SENSITIVE Sensitive     TRIMETH/SULFA <=10 SENSITIVE Sensitive     CLINDAMYCIN <=0.25 SENSITIVE Sensitive     RIFAMPIN <=0.5 SENSITIVE Sensitive     Inducible Clindamycin NEGATIVE Sensitive     * STAPHYLOCOCCUS AUREUS  Blood Culture ID Panel (Reflexed)     Status: Abnormal   Collection Time: 05/25/22  2:26 PM  Result Value Ref Range Status   Enterococcus faecalis NOT DETECTED NOT DETECTED Final   Enterococcus Faecium NOT DETECTED NOT DETECTED Final   Listeria monocytogenes NOT DETECTED NOT DETECTED Final   Staphylococcus species DETECTED (A) NOT DETECTED Final    Comment: CRITICAL RESULT CALLED TO, READ BACK BY AND VERIFIED WITH:  C/ PHARMD J. GADHIA 05/26/22 0950 A. LAFRANCE    Staphylococcus aureus (BCID) DETECTED (A) NOT DETECTED Final    Comment: CRITICAL RESULT CALLED TO, READ BACK BY AND VERIFIED WITH:  C/ PHARMD J. GADHIA 05/26/22 0950 A. LAFRANCE    Staphylococcus epidermidis NOT DETECTED NOT DETECTED Final   Staphylococcus lugdunensis NOT DETECTED NOT DETECTED Final   Streptococcus species NOT DETECTED NOT DETECTED Final   Streptococcus  agalactiae NOT DETECTED NOT DETECTED Final   Streptococcus pneumoniae NOT DETECTED NOT DETECTED Final   Streptococcus pyogenes NOT DETECTED NOT DETECTED Final   A.calcoaceticus-baumannii NOT DETECTED NOT DETECTED Final   Bacteroides fragilis NOT DETECTED NOT DETECTED Final   Enterobacterales NOT DETECTED NOT DETECTED Final   Enterobacter cloacae complex NOT DETECTED NOT DETECTED Final   Escherichia coli NOT DETECTED NOT DETECTED Final   Klebsiella aerogenes NOT DETECTED NOT DETECTED Final   Klebsiella oxytoca NOT DETECTED NOT DETECTED Final   Klebsiella pneumoniae NOT DETECTED NOT DETECTED Final   Proteus species NOT DETECTED NOT DETECTED Final   Salmonella species NOT DETECTED NOT DETECTED Final   Serratia marcescens NOT DETECTED NOT DETECTED Final   Haemophilus influenzae NOT DETECTED NOT DETECTED Final   Neisseria meningitidis NOT DETECTED NOT DETECTED Final   Pseudomonas aeruginosa NOT DETECTED NOT DETECTED Final   Stenotrophomonas maltophilia NOT DETECTED NOT DETECTED Final   Candida albicans NOT DETECTED NOT DETECTED Final   Candida auris NOT DETECTED NOT DETECTED Final   Candida glabrata NOT DETECTED NOT DETECTED Final   Candida krusei NOT DETECTED NOT DETECTED Final   Candida parapsilosis NOT DETECTED NOT DETECTED Final   Candida tropicalis NOT DETECTED NOT DETECTED Final   Cryptococcus neoformans/gattii NOT DETECTED NOT DETECTED Final   Meth resistant mecA/C and MREJ NOT DETECTED NOT DETECTED Final    Comment: Performed at [facility], [address] [place], [place] [address]  Resp panel by RT-PCR (RSV, Flu A&B, Covid) Anterior Nasal Swab     Status: None   Collection Time: 05/25/22  8:17 PM   Specimen: Anterior Nasal Swab  Result Value Ref Range Status   SARS Coronavirus 2 by RT PCR NEGATIVE NEGATIVE Final    Comment: (NOTE) SARS-CoV-2 target nucleic acids are NOT DETECTED.  The SARS-CoV-2 RNA is generally detectable in upper respiratory specimens during the  acute phase of infection. The lowest concentration of SARS-CoV-2 viral copies this assay  can detect is 138 copies/mL. A negative result does not preclude SARS-Cov-2 infection and should not be used as the sole basis for treatment or other patient management decisions. A negative result may occur with  improper specimen collection/handling, submission of specimen other than nasopharyngeal swab, presence of viral mutation(s) within the areas targeted by this assay, and inadequate number of viral copies(<138 copies/mL). A negative result must be combined with clinical observations, patient history, and epidemiological information. The expected result is Negative.  Fact Sheet for Patients:  EntrepreneurPulse.com.au  Fact Sheet for Healthcare Providers:  IncredibleEmployment.be  This test is no t yet approved or cleared by the [place] FDA and  has been authorized for detection and/or diagnosis of SARS-CoV-2 by FDA under an Emergency Use Authorization (EUA). This EUA will remain  in effect (meaning this test can be used) for the duration of the COVID-19 declaration under Section 564(b)(1) of the Act, 21 U.S.C.section 360bbb-3(b)(1), unless the authorization is terminated  or revoked sooner.       Influenza A by PCR NEGATIVE NEGATIVE Final   Influenza B by PCR NEGATIVE NEGATIVE Final    Comment: (NOTE) The Xpert Xpress SARS-CoV-2/FLU/RSV plus assay is intended as an aid in the diagnosis of influenza from Nasopharyngeal swab specimens and should not be used as a sole basis for treatment. Nasal washings and aspirates are unacceptable for Xpert Xpress SARS-CoV-2/FLU/RSV testing.  Fact Sheet for Patients: EntrepreneurPulse.com.au  Fact Sheet for Healthcare Providers: IncredibleEmployment.be  This test is not yet approved or cleared by the [place] FDA and has been authorized for detection and/or  diagnosis of SARS-CoV-2 by FDA under an Emergency Use Authorization (EUA). This EUA will remain in effect (meaning this test can be used) for the duration of the COVID-19 declaration under Section 564(b)(1) of the Act, 21 U.S.C. section 360bbb-3(b)(1), unless the authorization is terminated or revoked.     Resp Syncytial Virus by PCR NEGATIVE NEGATIVE Final    Comment: (NOTE) Fact Sheet for Patients: EntrepreneurPulse.com.au  Fact Sheet for Healthcare Providers: IncredibleEmployment.be  This test is not yet approved or cleared by the [place] FDA and has been authorized for detection and/or diagnosis of SARS-CoV-2 by FDA under an Emergency Use Authorization (EUA). This EUA will remain in effect (meaning this test can be used) for the duration of the COVID-19 declaration under Section 564(b)(1) of the Act, 21 U.S.C. section 360bbb-3(b)(1), unless the authorization is terminated or revoked.  Performed at [facility], [place] [address] [place], [place] [address]   Culture, blood (Routine X 2) w Reflex to ID Panel     Status: None (Preliminary result)   Collection Time: 05/28/22 12:03 PM   Specimen: BLOOD RIGHT ARM  Result Value Ref Range Status   Specimen Description   Final    BLOOD RIGHT ARM Performed at [place] [address] [place], [place] [address]    Special Requests   Final    BOTTLES DRAWN AEROBIC AND ANAEROBIC Blood Culture adequate volume Performed at [place] [address] [place], [place] [address]    Culture   Final    NO GROWTH 3 DAYS Performed at [facility], [place] [address], [place], [place] [address]    Report Status PENDING  Incomplete  Culture, blood (Routine X 2) w Reflex to ID Panel     Status: None (Preliminary result)   Collection Time: 05/28/22 12:03 PM   Specimen: BLOOD  Result Value Ref Range  Status   Specimen Description   Final     BLOOD SITE NOT SPECIFIED Performed at [place] [address] [place], [place] [address]    Special Requests   Final    BOTTLES DRAWN AEROBIC AND ANAEROBIC Blood Culture adequate volume Performed at [place] [address] [place], [place] [address]    Culture   Final    NO GROWTH 3 DAYS Performed at [facility], [place] [address], [place], [place] [address]    Report Status PENDING  Incomplete    Labs: CBC: Recent Labs  Lab 05/25/22 0304 05/25/22 1507 05/26/22 0322 05/27/22 0315 05/27/22 1817 05/28/22 0724 05/29/22 0312 05/30/22 0610 05/31/22 0500  WBC 1.5* 1.2* 1.6* 2.2* 2.3* 1.3* 1.6* 2.0* 2.6*  NEUTROABS 1.1* 0.7* 0.9* 0.9*  --  0.5*  --   --   --   HGB 9.7* 8.2* 10.7* 10.5* 10.6* 9.9* 9.4* 9.7* 10.1*  HCT 28.6* 24.8* 31.7* 32.1* 32.7* 30.2* 30.1* 30.7* 32.8*  MCV 81.7 82.9 82.1 82.9 83.6 83.7 85.8 87.2 86.5  PLT 11* 15* 16* 18* 28* 24* 28* 32* 42*   Basic Metabolic Panel: Recent Labs  Lab 05/25/22 0542 05/26/22 0322 05/27/22 0315 05/28/22 0724 05/29/22 0312 05/30/22 0610 05/31/22 0500  NA 127* 132* 129* 133* 133* 136 131*  K 3.5 3.0* 2.9* 3.2* 3.4* 3.9 4.0  CL 90* 92* 94* 100 98 100 96*  CO2 '27 28 30 28 27 28 28  '$ GLUCOSE 176* 127* 130* 181* 191* 191* 178*  BUN '9 6 8 7 7 6 8  '$ CREATININE 0.73 0.57* 0.61 0.59* 0.60* 0.57* 0.68  CALCIUM 8.2* 8.2* 8.1* 8.0* 8.2* 8.4* 8.3*  MG 1.6* 1.8 1.9 1.7  --   --   --   PHOS 2.0* 3.0 2.5 2.7  --   --   --    Liver Function Tests: Recent Labs  Lab 05/25/22 0542 05/26/22 0322 05/27/22 0315 05/28/22 0724 05/29/22 0312  AST 70* 79* 87* 63* 55*  ALT 68* 61* 61* 46* 40  ALKPHOS 67 64 70 74 70  BILITOT 3.2* 3.2* 2.8* 2.7* 2.5*  PROT 6.3* 6.1* 6.4* 6.2* 5.9*  ALBUMIN 3.4* 3.3* 3.4* 3.2* 3.1*   CBG: Recent Labs  Lab 05/30/22 1155 05/30/22 1601 05/30/22 2157 05/31/22 0745 05/31/22 1126  GLUCAP 208* 221* 241* 154* 206*    Discharge time spent: approximately 35  minutes spent on discharge counseling, evaluation of patient on day of discharge, and coordination of discharge planning with nursing, social work, pharmacy and case management  Signed: Edwin Dada, MD [facility] 05/31/2022

## 2022-05-31 NOTE — TOC Transition Note (Signed)
Transition of Care [facility]) - CM/SW Discharge Note  Patient Details  Name: [name] MRN: [id number] Date of Birth: 1970/06/04  Transition of Care [facility]) CM/SW Contact:  Sherie Don, LCSW Phone Number: 05/31/2022, 1:45 PM  Clinical Narrative: Patient is medically ready for discharge. CSW confirmed with Pam with [facility] that IV antibiotics will be delivered today and patient has been set up with [facility] for [facility] TOC signing off.   Final next level of care: Home w Home Health Services Barriers to Discharge: Barriers Resolved  Patient Goals and CMS Choice CMS Medicare.gov Compare Post Acute Care list provided to:: Patient Choice offered to / list presented to : Patient  Discharge Plan and Services Additional resources added to the After Visit Summary for   Discharge Planning Services: CM Consult      DME Arranged: N/A DME Agency: NA HH Arranged: RN, IV Antibiotics HH Agency: Other - See comment, [facility] [employer]) Date HH Agency Contacted: 05/31/22 Representative spoke with at [place]: Osborne Casco)  Social Determinants of Health ([place]) Interventions SDOH Screenings   Food Insecurity: No Food Insecurity (05/24/2022)  Housing: Low Risk  (05/24/2022)  Transportation Needs: No Transportation Needs (05/24/2022)  Utilities: Not At Risk (05/24/2022)  Alcohol Screen: High Risk (12/09/2020)  Tobacco Use: Low Risk  (05/23/2022)   Readmission Risk Interventions     No data to display

## 2022-06-02 ENCOUNTER — Other Ambulatory Visit (HOSPITAL_COMMUNITY): Payer: Self-pay

## 2022-06-02 LAB — CULTURE, BLOOD (ROUTINE X 2)
Culture: NO GROWTH
Culture: NO GROWTH
Special Requests: ADEQUATE
Special Requests: ADEQUATE

## 2022-06-05 DIAGNOSIS — Z7689 Persons encountering health services in other specified circumstances: Secondary | ICD-10-CM | POA: Diagnosis not present

## 2022-06-07 DIAGNOSIS — R7881 Bacteremia: Secondary | ICD-10-CM | POA: Diagnosis not present

## 2022-06-13 DIAGNOSIS — R7881 Bacteremia: Secondary | ICD-10-CM | POA: Diagnosis not present

## 2022-06-13 DIAGNOSIS — Z7689 Persons encountering health services in other specified circumstances: Secondary | ICD-10-CM | POA: Diagnosis not present

## 2022-06-14 DIAGNOSIS — R7881 Bacteremia: Secondary | ICD-10-CM | POA: Diagnosis not present

## 2022-06-15 DIAGNOSIS — R7881 Bacteremia: Secondary | ICD-10-CM | POA: Diagnosis not present

## 2022-06-16 DIAGNOSIS — R7881 Bacteremia: Secondary | ICD-10-CM | POA: Diagnosis not present

## 2022-06-17 DIAGNOSIS — R7881 Bacteremia: Secondary | ICD-10-CM | POA: Diagnosis not present

## 2022-06-18 DIAGNOSIS — R7881 Bacteremia: Secondary | ICD-10-CM | POA: Diagnosis not present

## 2022-06-18 DIAGNOSIS — E109 Type 1 diabetes mellitus without complications: Secondary | ICD-10-CM | POA: Diagnosis not present

## 2022-06-18 DIAGNOSIS — H40051 Ocular hypertension, right eye: Secondary | ICD-10-CM | POA: Diagnosis not present

## 2022-06-18 DIAGNOSIS — H27111 Subluxation of lens, right eye: Secondary | ICD-10-CM | POA: Diagnosis not present

## 2022-06-18 DIAGNOSIS — H2513 Age-related nuclear cataract, bilateral: Secondary | ICD-10-CM | POA: Diagnosis not present

## 2022-06-18 DIAGNOSIS — S0511XS Contusion of eyeball and orbital tissues, right eye, sequela: Secondary | ICD-10-CM | POA: Diagnosis not present

## 2022-06-19 DIAGNOSIS — R7881 Bacteremia: Secondary | ICD-10-CM | POA: Diagnosis not present

## 2022-06-20 DIAGNOSIS — R7881 Bacteremia: Secondary | ICD-10-CM | POA: Diagnosis not present

## 2022-06-20 DIAGNOSIS — Z7689 Persons encountering health services in other specified circumstances: Secondary | ICD-10-CM | POA: Diagnosis not present

## 2022-06-21 DIAGNOSIS — R7881 Bacteremia: Secondary | ICD-10-CM | POA: Diagnosis not present

## 2022-06-22 DIAGNOSIS — R7881 Bacteremia: Secondary | ICD-10-CM | POA: Diagnosis not present

## 2022-06-23 DIAGNOSIS — R7881 Bacteremia: Secondary | ICD-10-CM | POA: Diagnosis not present

## 2022-06-24 ENCOUNTER — Other Ambulatory Visit: Payer: Self-pay

## 2022-06-24 ENCOUNTER — Encounter: Payer: Self-pay | Admitting: Infectious Diseases

## 2022-06-24 ENCOUNTER — Telehealth: Payer: Self-pay

## 2022-06-24 ENCOUNTER — Ambulatory Visit (INDEPENDENT_AMBULATORY_CARE_PROVIDER_SITE_OTHER): Payer: 59 | Admitting: Infectious Diseases

## 2022-06-24 VITALS — BP 136/86 | HR 90 | Temp 98.1°F | Resp 16 | Ht 67.0 in | Wt 155.2 lb

## 2022-06-24 DIAGNOSIS — R7881 Bacteremia: Secondary | ICD-10-CM

## 2022-06-24 DIAGNOSIS — Z79899 Other long term (current) drug therapy: Secondary | ICD-10-CM | POA: Diagnosis not present

## 2022-06-24 DIAGNOSIS — R160 Hepatomegaly, not elsewhere classified: Secondary | ICD-10-CM | POA: Diagnosis not present

## 2022-06-24 DIAGNOSIS — K7030 Alcoholic cirrhosis of liver without ascites: Secondary | ICD-10-CM

## 2022-06-24 DIAGNOSIS — B9561 Methicillin susceptible Staphylococcus aureus infection as the cause of diseases classified elsewhere: Secondary | ICD-10-CM

## 2022-06-24 DIAGNOSIS — Z452 Encounter for adjustment and management of vascular access device: Secondary | ICD-10-CM

## 2022-06-24 NOTE — Telephone Encounter (Signed)
  Per provider ok to [place] after End Date.   Provider:Dr Manandhar  End Date:06/25/2022 (after last dose)   Notified [place] and [place].

## 2022-06-24 NOTE — Progress Notes (Signed)
Patient Active Problem List   Diagnosis Date Noted   MSSA bacteremia 05/27/2022   UTI (urinary tract infection) 05/26/2022   Hypophosphatemia 05/25/2022   Hyponatremia 05/25/2022   Hypoxia 05/25/2022   History of eye trauma 05/23/2022   Hematoma of right flank 05/23/2022   Recurrent falls 05/22/2022   Psychophysiological insomnia 04/03/2022   Benign prostatic hyperplasia (BPH) with straining on urination 04/03/2022   Other pancytopenia 02/03/2022   Liver mass    Low TSH level 12/25/2021   GIB (gastrointestinal bleeding) 12/24/2021   DM (diabetes mellitus), type 2 12/24/2021   Alcohol withdrawal 12/12/2020   Substance induced mood disorder 12/09/2020   Hypokalemia 08/15/2012   Hypomagnesemia 08/15/2012   Other and unspecified hyperlipidemia 08/15/2012   Encephalopathy acute 08/09/2012   Alcohol withdrawal with complication with inpatient treatment 08/08/2012   Portal hypertension 08/08/2012   Splenomegaly [id number]   Alcoholic hepatitis without ascites 08/08/2012   Hiatal hernia 08/08/2012   Elevated blood alcohol level 08/06/2012   Acute pancreatitis 08/06/2012   Cirrhosis of liver 08/06/2012   Thrombocytopenia ([place]) 08/06/2012   Hypertension    Anxiety    DVT (deep venous thrombosis)     Patient's Medications  New Prescriptions   No medications on file  Previous Medications   CEFAZOLIN (ANCEF) IVPB    Inject 2 g into the vein every 8 (eight) hours for 26 days. Indication:  MSSA thrombophlebitis First Dose: Yes Last Day of Therapy:  06/25/22 Labs - Once weekly:  CBC/D and BMP, Labs - Every other week:  ESR and CRP Method of administration: IV Push Pull PICC line at the completion of IV therapy Method of administration may be changed at the discretion of home infusion pharmacist based upon assessment of the patient and/or caregiver's ability to self-administer the medication ordered.   FOLIC ACID (FOLVITE) 1 MG TABLET    TAKE 1 TABLET BY MOUTH EVERY DAY    FUROSEMIDE (LASIX) 20 MG TABLET    Take 1 tablet (20 mg total) by mouth daily.   INSULIN GLARGINE (LANTUS) 100 UNIT/ML SOLOSTAR PEN    Inject 8 Units into the skin daily.   INSULIN PEN NEEDLE (PEN NEEDLES) 31G X 5 MM MISC    Use 3 (three) times daily.   LACTULOSE (CHRONULAC) 10 GM/15ML SOLUTION    Take 45 mLs (30 g total) by mouth daily.   MIRTAZAPINE (REMERON) 15 MG TABLET    Take 1 tablet (15 mg total) by mouth at bedtime.   PANTOPRAZOLE (PROTONIX) 40 MG TABLET    Take 1 tablet (40 mg total) by mouth 2 (two) times daily before a meal.   SPIRONOLACTONE (ALDACTONE) 25 MG TABLET    Take 1 tablet (25 mg total) by mouth daily.   TAMSULOSIN (FLOMAX) 0.4 MG CAPS CAPSULE    Take 1 capsule (0.4 mg total) by mouth daily after supper.   THIAMINE (VITAMIN B1) 100 MG TABLET    Take 1 tablet (100 mg total) by mouth daily.   TRAZODONE (DESYREL) 150 MG TABLET    Take 150 mg by mouth at bedtime.  Modified Medications   No medications on file  Discontinued Medications   No medications on file   Subjective: 52 year old male with history of alcohol abuse, liver cirrhosis with associated portal hypertension and pancytopenia, HTN, DM 2 who is here for HFU for complicated MSSA bacteremia with noscomial onset with purulence noted in the PIV site and CT concerning for thrombophlebitis and cellulitis  in the PIV site. Discharged on 3/9 with planned 4 weeks course of IV cefazolin, EOT 06/25/22.   06/24/22 Reports getting IV abtx without any issues related to PICC or abtx. Saw Ophthalmologist last week and is supposed to get surgery on his rt eye once finds a place which accepts his insurance ? [facility]. Reports having fu with Dr Burr Medico tomorrow. He thinks he has a fu with GI for his liver cirrhosis. Doing well with no concerns otherwise.   Review of Systems: all systems reviewed with pertinent positives and negatives as listed above  Past Medical History:  Diagnosis Date   Alcoholic ([place])    Anxiety    Diabetes  ([place])    DVT (deep venous thrombosis) ([place])    Hypertension    Past Surgical History:  Procedure Laterality Date   BLOOD PATCH     ESOPHAGOGASTRODUODENOSCOPY N/A 12/26/2021   Procedure: ESOPHAGOGASTRODUODENOSCOPY (EGD);  Surgeon: Ronnette Juniper, MD;  Location: [facility];  Service: Gastroenterology;  Laterality: N/A;   TONSILLECTOMY       Social History   Tobacco Use   Smoking status: Never   Smokeless tobacco: Never  Vaping Use   Vaping Use: Never used  Substance Use Topics   Alcohol use: Yes    Alcohol/week: 100.0 standard drinks of alcohol    Types: 100 Cans of beer per week    Comment: last consumed 2 beers 05/22/22 AM   Drug use: No    Family History  Problem Relation Age of Onset   Hypertension Father     No Known Allergies  Health Maintenance  Topic Date Due   COVID-19 Vaccine (1) Never done   FOOT EXAM  Never done   OPHTHALMOLOGY EXAM  Never done   Diabetic kidney evaluation - Urine ACR  Never done   DTaP/Tdap/Td (1 - Tdap) Never done   COLONOSCOPY (Pts 45-20yrs Insurance coverage will need to be confirmed)  Never done   Zoster Vaccines- Shingrix (1 of 2) 09/23/2022 (Originally 11/04/2018)   HEMOGLOBIN A1C  10/02/2022   INFLUENZA VACCINE  10/23/2022   Diabetic kidney evaluation - eGFR measurement  05/31/2023   Hepatitis C Screening  Completed   HIV Screening  Completed   HPV VACCINES  Aged Out    Objective: BP 136/86   Pulse 90   Temp 98.1 F (36.7 C)   Resp 16   Ht 5\' 7"  (1.702 m)   Wt 155 lb 3.2 oz (70.4 kg)   SpO2 98%   BMI 24.31 kg/m    Physical Exam Constitutional:      Appearance: Normal appearance.  HENT:     Head: Normocephalic and atraumatic.      Mouth: Mucous membranes are moist.  Eyes:    Conjunctiva/sclera: Conjunctivae normal.     Pupils: Pupils are equal, round, and bilaterally symmetrical   Cardiovascular:     Rate and Rhythm: Normal rate and regular rhythm.     Heart sounds:   Pulmonary:     Effort: Pulmonary effort  is normal on room air     Breath sounds:    Abdominal:     General: Non distended     Palpations: soft.   Musculoskeletal:        General: Normal range of motion.   Skin:    General: Skin is warm and dry.     Comments: Rt arm PICC with no concerns   Neurological:     General: grossly non focal     Mental  Status: awake, alert and oriented to person, place, and time.   Psychiatric:        Mood and Affect: Mood normal.   Lab Results Lab Results  Component Value Date   WBC 2.6 (L) 05/31/2022   HGB 10.1 (L) 05/31/2022   HCT 32.8 (L) 05/31/2022   MCV 86.5 05/31/2022   PLT 42 (L) 05/31/2022    Lab Results  Component Value Date   CREATININE 0.68 05/31/2022   BUN 8 05/31/2022   NA 131 (L) 05/31/2022   K 4.0 05/31/2022   CL 96 (L) 05/31/2022   CO2 28 05/31/2022    Lab Results  Component Value Date   ALT 40 05/29/2022   AST 55 (H) 05/29/2022   ALKPHOS 70 05/29/2022   BILITOT 2.5 (H) 05/29/2022    Lab Results  Component Value Date   CHOL 241 (H) 12/09/2020   HDL 100 12/09/2020   LDLCALC 128 (H) 12/09/2020   TRIG 64 12/09/2020   CHOLHDL 2.4 12/09/2020   No results found for: "LABRPR", "RPRTITER" No results found for: "HIV1RNAQUANT", "HIV1RNAVL", "CD4TABS"  Assessment/Plan 52 year old male with history of alcohol abuse, liver cirrhosis with associated portal hypertension and pancytopenia, HTN, DM 2 with   # Complicated MSSA bacteremia with nosocomial onset (purulence noted at his right [facility] PIV site) and CT concerning for thrombophlebitis/cellulitis in the RT [facility] vein  Repeat blood cultures 3/6 no growth in 2 days 3/3 urine culture with MSSA 3/5 TTE with no vegetation/endocarditis Poor TEE candidate due to varices and thrombocytopenia  Complete 4 weeks course on 06/25/22 Fu as needed   # PICC To be removed after last dose on 4/3  # Medication management  - labs 3/26 reviewed and unremarkable   # Liver Cirrhosis/Liver mass Reminded to fu with GI to r/o [facility] as  well as screening for varices  I have personally spent 45  minutes involved in face-to-face and non-face-to-face activities for this patient on the day of the visit. Professional time spent includes the following activities: Preparing to see the patient (review of tests), Obtaining and/or reviewing separately obtained history (admission/discharge record), Performing a medically appropriate examination and/or evaluation , Ordering medications/tests/procedures, referring and communicating with other health care professionals, Documenting clinical information in the EMR, Independently interpreting results (not separately reported), Communicating results to the patient/family/caregiver, Counseling and educating the patient/family/caregiver and Care coordination (not separately reported).   Wilber Oliphant, [place] [facility] 06/24/2022, 8:40 AM

## 2022-06-25 DIAGNOSIS — R7881 Bacteremia: Secondary | ICD-10-CM | POA: Diagnosis not present

## 2022-06-26 DIAGNOSIS — R7881 Bacteremia: Secondary | ICD-10-CM | POA: Diagnosis not present

## 2022-07-01 ENCOUNTER — Inpatient Hospital Stay: Payer: 59 | Attending: Hematology | Admitting: Hematology

## 2022-07-01 ENCOUNTER — Inpatient Hospital Stay: Payer: 59

## 2022-07-01 NOTE — Assessment & Plan Note (Deleted)
-  presented with coffee-ground emesis, admitted 01/20/22. CT AP showed liver cirrhosis, splenomegaly, and a 4.6 cm liver mass. Baseline AFP was WNL. -MRI showed 4.6 cm hemorrhagic mass in segment 8, difficult to characterize by LI-RADS due to lack non rim enhancement, but nonetheless concerning for [facility] until proven otherwise -liver biopsy on 12/27/21 showed predominantly necrotic tissue, no evidence of malignancy. -repeated CT in 04/2022 showed heterogeneous mass in the anterior right lobe of the liver measuring 4.4 cm and hypervascular lesion in the left lobe of the liver measuring 1.6 cm, overall stable.  Due to the high clinical concern for malignancy, I recommend repeat liver MRI and decide if we need a biopsy again.

## 2022-07-01 NOTE — Assessment & Plan Note (Deleted)
-  he has a history of heavy alcohol consumption -cirrhosis and small liver lesions were seen on CT AP back in 07/2012 (no records prior to this). -I again encouraged him to stop drinking alcohol completely.   

## 2022-07-01 NOTE — Assessment & Plan Note (Deleted)
-  secondary to splenomegaly and alcohol abuse -He previously required blood transfusion -He also has moderate to severe thrombocytopenia, which got worse during the last admission, and required frequent platelet transfusion.

## 2022-07-01 NOTE — Progress Notes (Deleted)
[facility]   [phone] [phone]   Clinic Follow up Note   Patient Care Team: Eloisa Northern, MD as PCP - General (Internal Medicine) Malachy Mood, MD as Attending Physician (Hematology and Oncology)  Date of Service:  07/01/2022  CHIEF COMPLAINT: f/u of liver mass   CURRENT THERAPY:  Observation   ASSESSMENT: *** [name] is a 52 y.o. male with   No problem-specific Assessment & Plan notes found for this encounter.  ***   PLAN:      SUMMARY OF ONCOLOGIC HISTORY: Oncology History   No history exists.     INTERVAL HISTORY: *** [name] is here for a follow up of liver mass . He was last seen by me on 01/01/2022. He presents to the clinic       All other systems were reviewed with the patient and are negative.  MEDICAL HISTORY:  Past Medical History:  Diagnosis Date   Alcoholic    Anxiety    Diabetes    DVT (deep venous thrombosis)    Hypertension     SURGICAL HISTORY: Past Surgical History:  Procedure Laterality Date   BLOOD PATCH     ESOPHAGOGASTRODUODENOSCOPY N/A 12/26/2021   Procedure: ESOPHAGOGASTRODUODENOSCOPY (EGD);  Surgeon: Kerin Salen, MD;  Location: [facility];  Service: Gastroenterology;  Laterality: N/A;   TONSILLECTOMY      I have reviewed the social history and family history with the patient and they are unchanged from previous note.  ALLERGIES:  has No Known Allergies.  MEDICATIONS:  Current Outpatient Medications  Medication Sig Dispense Refill   folic acid (FOLVITE) 1 MG tablet TAKE 1 TABLET BY MOUTH EVERY DAY 30 tablet 0   furosemide (LASIX) 20 MG tablet Take 1 tablet (20 mg total) by mouth daily. 30 tablet 3   insulin glargine (LANTUS) 100 UNIT/ML Solostar Pen Inject 8 Units into the skin daily. 15 mL 0   Insulin Pen Needle (PEN NEEDLES) 31G X 5 MM MISC Use 3 (three) times daily. 100 each 0   lactulose (CHRONULAC) 10 GM/15ML solution Take 45 mLs (30 g total) by mouth  daily. 236 mL 0   mirtazapine (REMERON) 15 MG tablet Take 1 tablet (15 mg total) by mouth at bedtime. 30 tablet 2   pantoprazole (PROTONIX) 40 MG tablet Take 1 tablet (40 mg total) by mouth 2 (two) times daily before a meal. 60 tablet 2   spironolactone (ALDACTONE) 25 MG tablet Take 1 tablet (25 mg total) by mouth daily. 30 tablet 3   tamsulosin (FLOMAX) 0.4 MG CAPS capsule Take 1 capsule (0.4 mg total) by mouth daily after supper. 30 capsule 4   thiamine (VITAMIN B1) 100 MG tablet Take 1 tablet (100 mg total) by mouth daily. 30 tablet 3   traZODone (DESYREL) 150 MG tablet Take 150 mg by mouth at bedtime.     No current facility-administered medications for this visit.    PHYSICAL EXAMINATION: ECOG PERFORMANCE STATUS: {CHL ONC ECOG PS:[id number]}  There were no vitals filed for this visit. Wt Readings from Last 3 Encounters:  06/24/22 155 lb 3.2 oz (70.4 kg)  05/31/22 154 lb 8.7 oz (70.1 kg)  04/03/22 162 lb (73.5 kg)    {Only keep what was examined. If exam not performed, can use .CEXAM } GENERAL:alert, no distress and comfortable SKIN: skin color, texture, turgor are normal, no rashes or significant lesions EYES: normal, Conjunctiva are pink and non-injected, sclera clear {OROPHARYNX:no exudate, no erythema and lips, buccal  mucosa, and tongue normal}  NECK: supple, thyroid normal size, non-tender, without nodularity LYMPH:  no palpable lymphadenopathy in the cervical, axillary {or inguinal} LUNGS: clear to auscultation and percussion with normal breathing effort HEART: regular rate & rhythm and no murmurs and no lower extremity edema ABDOMEN:abdomen soft, non-tender and normal bowel sounds Musculoskeletal:no cyanosis of digits and no clubbing  NEURO: alert & oriented x 3 with fluent speech, no focal motor/sensory deficits  LABORATORY DATA:  I have reviewed the data as listed    Latest Ref Rng & Units 05/31/2022    5:00 AM 05/30/2022    6:10 AM 05/29/2022    3:12 AM  CBC  WBC 4.0  - 10.5 K/uL 2.6  2.0  1.6   Hemoglobin 13.0 - 17.0 g/dL 29.9  9.7  9.4   Hematocrit 39.0 - 52.0 % 32.8  30.7  30.1   Platelets 150 - 400 K/uL 42  32  28         Latest Ref Rng & Units 05/31/2022    5:00 AM 05/30/2022    6:10 AM 05/29/2022    3:12 AM  CMP  Glucose 70 - 99 mg/dL 371  696  789   BUN 6 - 20 mg/dL 8  6  7    Creatinine 0.61 - 1.24 mg/dL 3.81  0.17  5.10   Sodium 135 - 145 mmol/L 131  136  133   Potassium 3.5 - 5.1 mmol/L 4.0  3.9  3.4   Chloride 98 - 111 mmol/L 96  100  98   CO2 22 - 32 mmol/L 28  28  27    Calcium 8.9 - 10.3 mg/dL 8.3  8.4  8.2   Total Protein 6.5 - 8.1 g/dL   5.9   Total Bilirubin 0.3 - 1.2 mg/dL   2.5   Alkaline Phos 38 - 126 U/L   70   AST 15 - 41 U/L   55   ALT 0 - 44 U/L   40       RADIOGRAPHIC STUDIES: I have personally reviewed the radiological images as listed and agreed with the findings in the report. No results found.    No orders of the defined types were placed in this encounter.  All questions were answered. The patient knows to call the clinic with any problems, questions or concerns. No barriers to learning was detected. The total time spent in the appointment was {CHL ONC TIME VISIT - CHENI:[id number]}.     Salome Holmes, CMA 07/01/2022   I, Monica Martinez, CMA, am acting as scribe for Malachy Mood, MD.   {Add scribe attestation statement}

## 2022-07-22 ENCOUNTER — Inpatient Hospital Stay (HOSPITAL_BASED_OUTPATIENT_CLINIC_OR_DEPARTMENT_OTHER)
Admission: EM | Admit: 2022-07-22 | Discharge: 2022-07-27 | DRG: 897 | Disposition: A | Discharge status: Home / Self Care | Payer: 59 | Attending: Internal Medicine | Admitting: Internal Medicine

## 2022-07-22 ENCOUNTER — Encounter (HOSPITAL_BASED_OUTPATIENT_CLINIC_OR_DEPARTMENT_OTHER): Payer: Self-pay | Admitting: Emergency Medicine

## 2022-07-22 ENCOUNTER — Other Ambulatory Visit: Payer: Self-pay

## 2022-07-22 DIAGNOSIS — F419 Anxiety disorder, unspecified: Secondary | ICD-10-CM | POA: Diagnosis present

## 2022-07-22 DIAGNOSIS — F329 Major depressive disorder, single episode, unspecified: Secondary | ICD-10-CM | POA: Diagnosis present

## 2022-07-22 DIAGNOSIS — Z8249 Family history of ischemic heart disease and other diseases of the circulatory system: Secondary | ICD-10-CM

## 2022-07-22 DIAGNOSIS — I10 Essential (primary) hypertension: Secondary | ICD-10-CM | POA: Diagnosis present

## 2022-07-22 DIAGNOSIS — R160 Hepatomegaly, not elsewhere classified: Secondary | ICD-10-CM | POA: Diagnosis present

## 2022-07-22 DIAGNOSIS — K7030 Alcoholic cirrhosis of liver without ascites: Secondary | ICD-10-CM | POA: Diagnosis present

## 2022-07-22 DIAGNOSIS — E876 Hypokalemia: Secondary | ICD-10-CM | POA: Diagnosis present

## 2022-07-22 DIAGNOSIS — Y92009 Unspecified place in unspecified non-institutional (private) residence as the place of occurrence of the external cause: Secondary | ICD-10-CM

## 2022-07-22 DIAGNOSIS — D61818 Other pancytopenia: Secondary | ICD-10-CM | POA: Diagnosis present

## 2022-07-22 DIAGNOSIS — F10930 Alcohol use, unspecified with withdrawal, uncomplicated: Secondary | ICD-10-CM | POA: Diagnosis present

## 2022-07-22 DIAGNOSIS — D6959 Other secondary thrombocytopenia: Secondary | ICD-10-CM | POA: Diagnosis present

## 2022-07-22 DIAGNOSIS — Y908 Blood alcohol level of 240 mg/100 ml or more: Secondary | ICD-10-CM | POA: Diagnosis present

## 2022-07-22 DIAGNOSIS — E119 Type 2 diabetes mellitus without complications: Secondary | ICD-10-CM | POA: Diagnosis present

## 2022-07-22 DIAGNOSIS — Z8619 Personal history of other infectious and parasitic diseases: Secondary | ICD-10-CM

## 2022-07-22 DIAGNOSIS — R7989 Other specified abnormal findings of blood chemistry: Secondary | ICD-10-CM | POA: Diagnosis present

## 2022-07-22 DIAGNOSIS — R9431 Abnormal electrocardiogram [ECG] [EKG]: Secondary | ICD-10-CM | POA: Diagnosis present

## 2022-07-22 DIAGNOSIS — F10229 Alcohol dependence with intoxication, unspecified: Secondary | ICD-10-CM | POA: Diagnosis present

## 2022-07-22 DIAGNOSIS — Z79899 Other long term (current) drug therapy: Secondary | ICD-10-CM

## 2022-07-22 DIAGNOSIS — D696 Thrombocytopenia, unspecified: Secondary | ICD-10-CM

## 2022-07-22 DIAGNOSIS — Z7141 Alcohol abuse counseling and surveillance of alcoholic: Secondary | ICD-10-CM

## 2022-07-22 DIAGNOSIS — R7881 Bacteremia: Secondary | ICD-10-CM | POA: Diagnosis present

## 2022-07-22 DIAGNOSIS — K7031 Alcoholic cirrhosis of liver with ascites: Secondary | ICD-10-CM | POA: Diagnosis present

## 2022-07-22 DIAGNOSIS — T5191XA Toxic effect of unspecified alcohol, accidental (unintentional), initial encounter: Secondary | ICD-10-CM | POA: Diagnosis present

## 2022-07-22 DIAGNOSIS — Z794 Long term (current) use of insulin: Secondary | ICD-10-CM

## 2022-07-22 DIAGNOSIS — F10230 Alcohol dependence with withdrawal, uncomplicated: Secondary | ICD-10-CM | POA: Diagnosis not present

## 2022-07-22 DIAGNOSIS — R000 Tachycardia, unspecified: Secondary | ICD-10-CM | POA: Diagnosis present

## 2022-07-22 DIAGNOSIS — B9561 Methicillin susceptible Staphylococcus aureus infection as the cause of diseases classified elsewhere: Secondary | ICD-10-CM | POA: Diagnosis present

## 2022-07-22 DIAGNOSIS — Z86718 Personal history of other venous thrombosis and embolism: Secondary | ICD-10-CM

## 2022-07-22 DIAGNOSIS — E118 Type 2 diabetes mellitus with unspecified complications: Secondary | ICD-10-CM

## 2022-07-22 DIAGNOSIS — K766 Portal hypertension: Secondary | ICD-10-CM | POA: Diagnosis present

## 2022-07-22 DIAGNOSIS — R739 Hyperglycemia, unspecified: Secondary | ICD-10-CM | POA: Diagnosis present

## 2022-07-22 LAB — CBC
HCT: 40.1 % (ref 39.0–52.0)
Hemoglobin: 13.9 g/dL (ref 13.0–17.0)
MCH: 26.2 pg (ref 26.0–34.0)
MCHC: 34.7 g/dL (ref 30.0–36.0)
MCV: 75.7 fL — ABNORMAL LOW (ref 80.0–100.0)
Platelets: 10 10*3/uL — CL (ref 150–400)
RBC: 5.3 MIL/uL (ref 4.22–5.81)
RDW: 15 % (ref 11.5–15.5)
WBC: 2.1 10*3/uL — ABNORMAL LOW (ref 4.0–10.5)
nRBC: 0 % (ref 0.0–0.2)

## 2022-07-22 LAB — COMPREHENSIVE METABOLIC PANEL
ALT: 83 U/L — ABNORMAL HIGH (ref 0–44)
AST: 135 U/L — ABNORMAL HIGH (ref 15–41)
Albumin: 3.7 g/dL (ref 3.5–5.0)
Alkaline Phosphatase: 91 U/L (ref 38–126)
Anion gap: 15 (ref 5–15)
BUN: <5 mg/dL — ABNORMAL LOW (ref 6–20)
CO2: 21 mmol/L — ABNORMAL LOW (ref 22–32)
Calcium: 7.9 mg/dL — ABNORMAL LOW (ref 8.9–10.3)
Chloride: 94 mmol/L — ABNORMAL LOW (ref 98–111)
Creatinine, Ser: 0.61 mg/dL (ref 0.61–1.24)
GFR, Estimated: >60 mL/min (ref >60)
Glucose, Bld: 259 mg/dL — ABNORMAL HIGH (ref 70–99)
Potassium: 3.6 mmol/L (ref 3.5–5.1)
Sodium: 130 mmol/L — ABNORMAL LOW (ref 135–145)
Total Bilirubin: 1.4 mg/dL — ABNORMAL HIGH (ref 0.3–1.2)
Total Protein: 6.9 g/dL (ref 6.5–8.1)

## 2022-07-22 LAB — RAPID URINE DRUG SCREEN, HOSP PERFORMED
Amphetamines: NOT DETECTED
Barbiturates: NOT DETECTED
Benzodiazepines: NOT DETECTED
Cocaine: NOT DETECTED
Opiates: NOT DETECTED
Tetrahydrocannabinol: NOT DETECTED

## 2022-07-22 LAB — URINALYSIS, ROUTINE W REFLEX MICROSCOPIC
Bilirubin Urine: NEGATIVE
Glucose, UA: >=500 mg/dL — AB
Ketones, ur: NEGATIVE mg/dL
Leukocytes,Ua: NEGATIVE
Nitrite: NEGATIVE
Protein, ur: >=300 mg/dL — AB
Specific Gravity, Urine: 1.015 (ref 1.005–1.030)
pH: 6 (ref 5.0–8.0)

## 2022-07-22 LAB — URINALYSIS, MICROSCOPIC (REFLEX)
RBC / HPF: >50 RBC/hpf (ref 0–5)
WBC, UA: NONE SEEN WBC/hpf (ref 0–5)

## 2022-07-22 LAB — LIPASE, BLOOD: Lipase: 82 U/L — ABNORMAL HIGH (ref 11–51)

## 2022-07-22 LAB — ETHANOL: Alcohol, Ethyl (B): 467 mg/dL (ref <10)

## 2022-07-22 LAB — AMMONIA: Ammonia: 29 umol/L (ref 9–35)

## 2022-07-22 MED ORDER — SODIUM CHLORIDE 0.9 % IV BOLUS
1000.0000 mL | Freq: Once | INTRAVENOUS | Status: AC
  Administered 2022-07-22: 1000 mL via INTRAVENOUS

## 2022-07-22 MED ORDER — LORAZEPAM 2 MG/ML IJ SOLN
2.0000 mg | Freq: Once | INTRAMUSCULAR | Status: AC
  Administered 2022-07-23: 2 mg via INTRAVENOUS
  Filled 2022-07-22: qty 1

## 2022-07-22 MED ORDER — SODIUM CHLORIDE 0.9 % IV BOLUS
1000.0000 mL | Freq: Once | INTRAVENOUS | Status: AC
  Administered 2022-07-23: 1000 mL via INTRAVENOUS

## 2022-07-22 NOTE — ED Notes (Signed)
Patient taken to [facility] in wheelchair. Too intoxicated to provide urine sample at this time.

## 2022-07-22 NOTE — ED Notes (Signed)
Patient assisted to [facility] with staff

## 2022-07-22 NOTE — ED Triage Notes (Signed)
Patient arrived via POV c/o alcohol problem. Patient last drink x 3hr pta. Patient states 1 12 pack of beer daily. Patient is slurring speech in triage. Patient is AO x 4, VS w/ elevated BP and HR, unable to stand or walk safely at this time.

## 2022-07-22 NOTE — ED Notes (Signed)
Patient becoming irritated that he is just laying in bed and nobody is giving him any medication to feel better. Also keeps getting up out of bed and stating he is going to leave and that he can leave at any time. Wife is still in room but will be leaving soon. EDP Teagler notified. EDP to go to the room soon.

## 2022-07-22 NOTE — ED Provider Notes (Signed)
[place] EMERGENCY DEPARTMENT AT [facility] [place] Provider Note   CSN: [id number] Arrival date & time: 07/22/22  2027     History {Add pertinent medical, surgical, social history, OB history to HPI:1} Chief Complaint  Patient presents with   Alcohol Problem    [name] is a 52 y.o. male.   Alcohol Problem       Home Medications Prior to Admission medications   Medication Sig Start Date End Date Taking? Authorizing Provider  folic acid (FOLVITE) 1 MG tablet TAKE 1 TABLET BY MOUTH EVERY DAY 05/05/22   Eloisa Northern, MD  furosemide (LASIX) 20 MG tablet Take 1 tablet (20 mg total) by mouth daily. 06/01/22   Danford, Earl Lites, MD  insulin glargine (LANTUS) 100 UNIT/ML Solostar Pen Inject 8 Units into the skin daily. 05/31/22   Danford, Earl Lites, MD  Insulin Pen Needle (PEN NEEDLES) 31G X 5 MM MISC Use 3 (three) times daily. 05/31/22   Danford, Earl Lites, MD  lactulose (CHRONULAC) 10 GM/15ML solution Take 45 mLs (30 g total) by mouth daily. 06/01/22   Danford, Earl Lites, MD  mirtazapine (REMERON) 15 MG tablet Take 1 tablet (15 mg total) by mouth at bedtime. 04/07/22   Eloisa Northern, MD  pantoprazole (PROTONIX) 40 MG tablet Take 1 tablet (40 mg total) by mouth 2 (two) times daily before a meal. 12/28/21 05/23/22  Dahal, Melina Schools, MD  spironolactone (ALDACTONE) 25 MG tablet Take 1 tablet (25 mg total) by mouth daily. 06/01/22   Danford, Earl Lites, MD  tamsulosin (FLOMAX) 0.4 MG CAPS capsule Take 1 capsule (0.4 mg total) by mouth daily after supper. 04/03/22   Eloisa Northern, MD  thiamine (VITAMIN B1) 100 MG tablet Take 1 tablet (100 mg total) by mouth daily. 05/31/22   Danford, Earl Lites, MD  traZODone (DESYREL) 150 MG tablet Take 150 mg by mouth at bedtime. 04/07/22   [provider]      Allergies    Patient has no known allergies.    Review of Systems   Review of Systems  Physical Exam Updated Vital Signs BP (!) 179/98 (BP Location: Left Arm)    Pulse (!) 118   Temp 98.6 F (37 C)   Resp 20   Ht 5\' 7"  (1.702 m)   Wt 72.6 kg   SpO2 95%   BMI 25.06 kg/m  Physical Exam  ED Results / Procedures / Treatments   Labs (all labs ordered are listed, but only abnormal results are displayed) Labs Reviewed  COMPREHENSIVE METABOLIC PANEL  ETHANOL  CBC  RAPID URINE DRUG SCREEN, HOSP PERFORMED    EKG None  Radiology No results found.  Procedures Procedures  {Document cardiac monitor, telemetry assessment procedure when appropriate:1}  Medications Ordered in ED Medications - No data to display  ED Course/ Medical Decision Making/ A&P   {   Click here for ABCD2, HEART and other calculatorsREFRESH Note before signing :1}                          Medical Decision Making Amount and/or Complexity of Data Reviewed Labs: ordered.  Risk Prescription drug management.    [name] is a 52 y.o. male with a past medical history significant for diabetes, liver cirrhosis, previous DVT, anxiety, hypertension, liver mass suspicious for hepatocellular carcinoma, and alcohol abuse who presents with intoxication and desire to quit drinking.  He reports that he continues to drink about 20 beers a  day and finally abnormal or he wants help.  He reports no new headache, neck pain, chest pain, shortness of breath, nausea, vomiting, constipation, diarrhea, or urinary changes.  He reports that he is concerned about his liver and his abdomen given the troubles he has had in the past.  He also denies any new falls or trauma and denies any headache.  He is concerned about what his glucose may be as he does not check it often.  He reports he has not had physical complaints but wants help with his drinking.  He denies SI, HI, or hallucinations.  Family is concerned about him in the setting of his known liver troubles.  On arrival, patient is tachycardic.  He is afebrile and not tachypneic.  On exam, lungs clear.  Has a murmur.  Abdomen  nontender.  Bowel sounds are appreciated.  He had intact sensation and strength in extremities.'s intact finger-nose-finger testing.  He did have horizontal nystagmus bilaterally.  Speech was clear for me.  Patient has dry mucous membranes.  He thinks he is dehydrated.  Clinically I am concerned patient is intoxicated and will need to metabolize however given his continued heavy drinking in the setting of this previous cirrhosis and suspected hepatocellular carcinoma, we will get some screening labs for medical clearance.  Will give him some fluids and let him metabolize.  He reports has never had any alcohol withdrawal seizures but is had some intense alcohol withdrawal problems in the past.  He said his last drink was right before coming to the emergency department.  Will get an EtOH level and other labs.  With his lack of trauma we will hold on any head imaging.  Anticipate TTS consultation to see what substance abuse assistance he might qualify for and will give him fluids to metabolize.  11:54 PM Patient's workup continues to return.  Alcohol is elevated at 467 and due to his drinking right before arrival it may still be rising.  His urinalysis does not show nitrites or leukocytes, doubt UTI.  UDS negative.  His labs are somewhat concerning as his platelets have dropped 10 down from 42 last month.  He also has a low white blood cell count but hemoglobin is normal now.  His ammonia is normal, and his metabolic panel shows some hyperglycemia but otherwise had normal kidney function.  LFTs are more elevated than last time.  Lipase elevated.  Patient is getting more tachycardic and shaky.  Will give Ativan and some fluids.  Given his worsening thrombocytopenia, I feel he is a very high bleed risk.  He reports he has not fallen or have any headache at this time so we will hold on head imaging.  He reports he chronically has right upper abdominal pain and this is not worsened.  Will hold on CT imaging at  this time I have low suspicion for acute bleed and his liver.  Due to his worsened thrombocytopenia, will call for admission for further management and monitoring of alcohol metabolism.   {Document critical care time when appropriate:1} {Document review of labs and clinical decision tools ie heart score, Chads2Vasc2 etc:1}  {Document your independent review of radiology images, and any outside records:1} {Document your discussion with family members, caretakers, and with consultants:1} {Document social determinants of health affecting pt's care:1} {Document your decision making why or why not admission, treatments were needed:1} Final Clinical Impression(s) / ED Diagnoses Final diagnoses:  None    Rx / DC Orders ED Discharge Orders  None       

## 2022-07-23 DIAGNOSIS — F329 Major depressive disorder, single episode, unspecified: Secondary | ICD-10-CM | POA: Diagnosis not present

## 2022-07-23 DIAGNOSIS — Z79899 Other long term (current) drug therapy: Secondary | ICD-10-CM | POA: Diagnosis not present

## 2022-07-23 DIAGNOSIS — Z86718 Personal history of other venous thrombosis and embolism: Secondary | ICD-10-CM | POA: Diagnosis not present

## 2022-07-23 DIAGNOSIS — T5191XA Toxic effect of unspecified alcohol, accidental (unintentional), initial encounter: Secondary | ICD-10-CM | POA: Diagnosis not present

## 2022-07-23 DIAGNOSIS — F419 Anxiety disorder, unspecified: Secondary | ICD-10-CM | POA: Diagnosis not present

## 2022-07-23 DIAGNOSIS — E119 Type 2 diabetes mellitus without complications: Secondary | ICD-10-CM | POA: Diagnosis not present

## 2022-07-23 DIAGNOSIS — Z8619 Personal history of other infectious and parasitic diseases: Secondary | ICD-10-CM | POA: Diagnosis not present

## 2022-07-23 DIAGNOSIS — B9561 Methicillin susceptible Staphylococcus aureus infection as the cause of diseases classified elsewhere: Secondary | ICD-10-CM

## 2022-07-23 DIAGNOSIS — Z7141 Alcohol abuse counseling and surveillance of alcoholic: Secondary | ICD-10-CM | POA: Diagnosis not present

## 2022-07-23 DIAGNOSIS — D61818 Other pancytopenia: Secondary | ICD-10-CM

## 2022-07-23 DIAGNOSIS — D6959 Other secondary thrombocytopenia: Secondary | ICD-10-CM | POA: Diagnosis not present

## 2022-07-23 DIAGNOSIS — F10929 Alcohol use, unspecified with intoxication, unspecified: Secondary | ICD-10-CM | POA: Diagnosis not present

## 2022-07-23 DIAGNOSIS — R739 Hyperglycemia, unspecified: Secondary | ICD-10-CM | POA: Diagnosis not present

## 2022-07-23 DIAGNOSIS — F10229 Alcohol dependence with intoxication, unspecified: Secondary | ICD-10-CM | POA: Diagnosis not present

## 2022-07-23 DIAGNOSIS — R7989 Other specified abnormal findings of blood chemistry: Secondary | ICD-10-CM | POA: Diagnosis not present

## 2022-07-23 DIAGNOSIS — R000 Tachycardia, unspecified: Secondary | ICD-10-CM | POA: Diagnosis not present

## 2022-07-23 DIAGNOSIS — R9431 Abnormal electrocardiogram [ECG] [EKG]: Secondary | ICD-10-CM | POA: Diagnosis not present

## 2022-07-23 DIAGNOSIS — R160 Hepatomegaly, not elsewhere classified: Secondary | ICD-10-CM

## 2022-07-23 DIAGNOSIS — I10 Essential (primary) hypertension: Secondary | ICD-10-CM | POA: Diagnosis not present

## 2022-07-23 DIAGNOSIS — R7881 Bacteremia: Secondary | ICD-10-CM

## 2022-07-23 DIAGNOSIS — F10930 Alcohol use, unspecified with withdrawal, uncomplicated: Secondary | ICD-10-CM | POA: Diagnosis not present

## 2022-07-23 DIAGNOSIS — Z794 Long term (current) use of insulin: Secondary | ICD-10-CM | POA: Diagnosis not present

## 2022-07-23 DIAGNOSIS — K766 Portal hypertension: Secondary | ICD-10-CM | POA: Diagnosis not present

## 2022-07-23 DIAGNOSIS — Z8249 Family history of ischemic heart disease and other diseases of the circulatory system: Secondary | ICD-10-CM | POA: Diagnosis not present

## 2022-07-23 DIAGNOSIS — K7031 Alcoholic cirrhosis of liver with ascites: Secondary | ICD-10-CM | POA: Diagnosis not present

## 2022-07-23 DIAGNOSIS — K7030 Alcoholic cirrhosis of liver without ascites: Secondary | ICD-10-CM

## 2022-07-23 DIAGNOSIS — Y92009 Unspecified place in unspecified non-institutional (private) residence as the place of occurrence of the external cause: Secondary | ICD-10-CM | POA: Diagnosis not present

## 2022-07-23 DIAGNOSIS — F10230 Alcohol dependence with withdrawal, uncomplicated: Secondary | ICD-10-CM | POA: Diagnosis not present

## 2022-07-23 DIAGNOSIS — E876 Hypokalemia: Secondary | ICD-10-CM | POA: Diagnosis not present

## 2022-07-23 DIAGNOSIS — Y908 Blood alcohol level of 240 mg/100 ml or more: Secondary | ICD-10-CM | POA: Diagnosis not present

## 2022-07-23 LAB — GLUCOSE, CAPILLARY
Glucose-Capillary: 220 mg/dL — ABNORMAL HIGH (ref 70–99)
Glucose-Capillary: 141 mg/dL — ABNORMAL HIGH (ref 70–99)
Glucose-Capillary: 129 mg/dL — ABNORMAL HIGH (ref 70–99)
Glucose-Capillary: 170 mg/dL — ABNORMAL HIGH (ref 70–99)

## 2022-07-23 LAB — CBC
HCT: 36.7 % — ABNORMAL LOW (ref 39.0–52.0)
Hemoglobin: 12.2 g/dL — ABNORMAL LOW (ref 13.0–17.0)
MCH: 26.1 pg (ref 26.0–34.0)
MCHC: 33.2 g/dL (ref 30.0–36.0)
MCV: 78.4 fL — ABNORMAL LOW (ref 80.0–100.0)
Platelets: 13 10*3/uL — CL (ref 150–400)
RBC: 4.68 MIL/uL (ref 4.22–5.81)
RDW: 15.4 % (ref 11.5–15.5)
WBC: 3 10*3/uL — ABNORMAL LOW (ref 4.0–10.5)
nRBC: 0 % (ref 0.0–0.2)

## 2022-07-23 LAB — PREPARE PLATELET PHERESIS: Unit division: 0

## 2022-07-23 LAB — BASIC METABOLIC PANEL
Anion gap: 14 (ref 5–15)
BUN: <5 mg/dL — ABNORMAL LOW (ref 6–20)
CO2: 21 mmol/L — ABNORMAL LOW (ref 22–32)
Calcium: 7.6 mg/dL — ABNORMAL LOW (ref 8.9–10.3)
Chloride: 98 mmol/L (ref 98–111)
Creatinine, Ser: 0.54 mg/dL — ABNORMAL LOW (ref 0.61–1.24)
GFR, Estimated: >60 mL/min (ref >60)
Glucose, Bld: 219 mg/dL — ABNORMAL HIGH (ref 70–99)
Potassium: 3.4 mmol/L — ABNORMAL LOW (ref 3.5–5.1)
Sodium: 133 mmol/L — ABNORMAL LOW (ref 135–145)

## 2022-07-23 LAB — PHOSPHORUS: Phosphorus: 3.1 mg/dL (ref 2.5–4.6)

## 2022-07-23 LAB — TYPE AND SCREEN
ABO/RH(D): A NEG
Antibody Screen: NEGATIVE

## 2022-07-23 LAB — MAGNESIUM: Magnesium: 1.5 mg/dL — ABNORMAL LOW (ref 1.7–2.4)

## 2022-07-23 MED ORDER — ORAL CARE MOUTH RINSE: 15.0000 mL | OROMUCOSAL | Status: DC | PRN

## 2022-07-23 MED ORDER — INSULIN ASPART 100 UNIT/ML IJ SOLN
0.0000 [IU] | Freq: Three times a day (TID) | INTRAMUSCULAR | Status: DC
  Administered 2022-07-23: 5 [IU] via SUBCUTANEOUS
  Administered 2022-07-23 – 2022-07-24 (×3): 2 [IU] via SUBCUTANEOUS
  Administered 2022-07-24 (×2): 3 [IU] via SUBCUTANEOUS

## 2022-07-23 MED ORDER — ONDANSETRON HCL 4 MG/2ML IJ SOLN
4.0000 mg | Freq: Four times a day (QID) | INTRAMUSCULAR | Status: DC | PRN
  Administered 2022-07-24: 4 mg via INTRAVENOUS
  Filled 2022-07-23: qty 2

## 2022-07-23 MED ORDER — FOLIC ACID 1 MG PO TABS
1.0000 mg | ORAL_TABLET | Freq: Every day | ORAL | Status: DC
  Administered 2022-07-23 – 2022-07-27 (×5): 1 mg via ORAL
  Filled 2022-07-23 (×5): qty 1

## 2022-07-23 MED ORDER — SODIUM CHLORIDE 0.9% IV SOLUTION: Freq: Once | INTRAVENOUS | Status: AC

## 2022-07-23 MED ORDER — ONDANSETRON HCL 4 MG PO TABS
4.0000 mg | ORAL_TABLET | Freq: Four times a day (QID) | ORAL | Status: DC | PRN
  Administered 2022-07-27: 4 mg via ORAL
  Filled 2022-07-23: qty 1

## 2022-07-23 MED ORDER — PANTOPRAZOLE SODIUM 40 MG PO TBEC
40.0000 mg | DELAYED_RELEASE_TABLET | Freq: Every day | ORAL | Status: DC
  Administered 2022-07-24 – 2022-07-27 (×4): 40 mg via ORAL
  Filled 2022-07-23 (×4): qty 1

## 2022-07-23 MED ORDER — LORAZEPAM 1 MG PO TABS
1.0000 mg | ORAL_TABLET | ORAL | Status: AC | PRN
  Administered 2022-07-23: 4 mg via ORAL
  Administered 2022-07-24: 2 mg via ORAL
  Administered 2022-07-24: 4 mg via ORAL
  Administered 2022-07-25 (×4): 1 mg via ORAL
  Administered 2022-07-25 – 2022-07-26 (×2): 2 mg via ORAL
  Administered 2022-07-26: 1 mg via ORAL
  Filled 2022-07-23 (×3): qty 1
  Filled 2022-07-23: qty 4
  Filled 2022-07-23 (×2): qty 1
  Filled 2022-07-23: qty 2
  Filled 2022-07-23: qty 4
  Filled 2022-07-23 (×2): qty 1
  Filled 2022-07-23 (×2): qty 2
  Filled 2022-07-23: qty 4

## 2022-07-23 MED ORDER — CHLORHEXIDINE GLUCONATE CLOTH 2 % EX PADS
6.0000 | MEDICATED_PAD | Freq: Every day | CUTANEOUS | Status: DC
  Administered 2022-07-23 – 2022-07-25 (×2): 6 via TOPICAL

## 2022-07-23 MED ORDER — LORAZEPAM 2 MG/ML IJ SOLN
1.0000 mg | INTRAMUSCULAR | Status: AC | PRN
  Administered 2022-07-23: 4 mg via INTRAVENOUS
  Administered 2022-07-23: 2 mg via INTRAVENOUS
  Administered 2022-07-23: 4 mg via INTRAVENOUS
  Administered 2022-07-23: 2 mg via INTRAVENOUS
  Administered 2022-07-23: 4 mg via INTRAVENOUS
  Administered 2022-07-23: 3 mg via INTRAVENOUS
  Administered 2022-07-24 – 2022-07-25 (×7): 4 mg via INTRAVENOUS
  Filled 2022-07-23: qty 2
  Filled 2022-07-23: qty 1
  Filled 2022-07-23 (×2): qty 2
  Filled 2022-07-23: qty 1
  Filled 2022-07-23 (×6): qty 2
  Filled 2022-07-23: qty 1
  Filled 2022-07-23: qty 2

## 2022-07-23 MED ORDER — THIAMINE MONONITRATE 100 MG PO TABS
100.0000 mg | ORAL_TABLET | Freq: Every day | ORAL | Status: DC
  Administered 2022-07-23 – 2022-07-27 (×5): 100 mg via ORAL
  Filled 2022-07-23 (×5): qty 1

## 2022-07-23 MED ORDER — THIAMINE HCL 100 MG/ML IJ SOLN: 100.0000 mg | Freq: Every day | INTRAMUSCULAR | Status: DC

## 2022-07-23 MED ORDER — INSULIN ASPART 100 UNIT/ML IJ SOLN: 0.0000 [IU] | Freq: Every day | INTRAMUSCULAR | Status: DC

## 2022-07-23 MED ORDER — ADULT MULTIVITAMIN W/MINERALS CH
1.0000 | ORAL_TABLET | Freq: Every day | ORAL | Status: DC
  Administered 2022-07-23 – 2022-07-27 (×5): 1 via ORAL
  Filled 2022-07-23 (×5): qty 1

## 2022-07-23 MED ORDER — TAMSULOSIN HCL 0.4 MG PO CAPS
0.4000 mg | ORAL_CAPSULE | Freq: Every day | ORAL | Status: DC
  Administered 2022-07-24 – 2022-07-26 (×3): 0.4 mg via ORAL
  Filled 2022-07-23 (×3): qty 1

## 2022-07-23 NOTE — Assessment & Plan Note (Addendum)
Continuing to show some clinical evidence of active withdrawal albeit improving Continuing CIWA protocol  Continue to dose patient with both scheduled benzodiazepines and as needed benzodiazepines for evidence of withdrawal.  Counseling on cessation daily Monitoring electrolytes closely and replacing as necessary

## 2022-07-23 NOTE — Progress Notes (Addendum)
Plan of Care Note for accepted transfer   Patient: [name] MRN: [id number]   DOA: 07/22/2022  Facility requesting transfer: [facility] ED Requesting Provider: Dr. Rush Landmark, EDP  Reason for transfer: Alcohol intoxication with concern for alcohol withdrawal.  Facility course: The patient is a 52 year old male with past medical history significant for alcohol abuse, mainly drinks beer, hepatic cirrhosis with associated portal hypertension, recent MSSA bacteremia in March 2024, history of pancytopenia who presented to [facility] ED from home for alcohol detoxification.    Last drink was about 3 hours prior to presentation.  Drinks about 1, 12 pack of beer daily.  Upon ER evaluation, the patient is unable to stand or walk safely.  Denies suicidal or homicidal ideation.  Alcohol level 467.  LFTs elevated.  WBC 2.1K, platelet count 10 K, hemoglobin within normal limit.  The patient received 2 L IV fluid boluses and CIWA protocol was initiated.  Admitted by [facility], hospitalist service to [facility] stepdown unit as inpatient status due to high risk for severe alcohol withdrawal.  Patient is also a high fall risk.  Plan of care: The patient is accepted for admission to [facility] unit, at [facility].  Author: Darlin Drop, DO 07/23/2022  Check [email or web address] for on-call coverage.  Nursing staff, Please call [facility] Admits & Consults System-Wide number on Amion as soon as patient's arrival, so appropriate admitting provider can evaluate the pt.

## 2022-07-23 NOTE — Assessment & Plan Note (Addendum)
Pt with known liver mass, possible hepatocellular carcinoma biopsy in Oct 2023 with just necrotic tissue Inability to repeat biopsy has been limited due to ongoing thrombocytopenia  Patient will need continued imaging surveillance every 3 months in the absence of the ability to do a biopsy.

## 2022-07-23 NOTE — H&P (Addendum)
History and Physical    Patient: [name]:[id number] DOB: Mar 15, 1971 DOA: 07/22/2022 DOS: the patient was seen and examined on 07/23/2022 PCP: Paul Northern, MD  Patient coming from: Home  Chief Complaint:  Chief Complaint  Patient presents with   Alcohol Problem   HPI: [name] is a 52 y.o. male with medical history significant of ongoing EtOH abuse, cirrhosis, HTN, DM2, portal hypertension with pancytopenia.   Pt in to ED at med center with c/o recurrent falls in setting of ongoing drinking.  He has very high BAL, leukopenia and thrombocytopenia that are chronic.  He was admitted for the same last month.  Review of Systems: As mentioned in the history of present illness. All other systems reviewed and are negative. Past Medical History:  Diagnosis Date   Alcoholic (HCC)    Anxiety    Diabetes (HCC)    DVT (deep venous thrombosis) (HCC)    Hypertension    Past Surgical History:  Procedure Laterality Date   BLOOD PATCH     ESOPHAGOGASTRODUODENOSCOPY N/A 12/26/2021   Procedure: ESOPHAGOGASTRODUODENOSCOPY (EGD);  Surgeon: Paul Salen, MD;  Location: [facility];  Service: Gastroenterology;  Laterality: N/A;   TONSILLECTOMY     Social History:  reports that he has never smoked. He has never used smokeless tobacco. He reports current alcohol use of about 100.0 standard drinks of alcohol per week. He reports that he does not use drugs.  No Known Allergies  Family History  Problem Relation Age of Onset   Hypertension Father     Prior to Admission medications   Medication Sig Start Date End Date Taking? Authorizing Provider  folic acid (FOLVITE) 1 MG tablet TAKE 1 TABLET BY MOUTH EVERY DAY 05/05/22   Paul Northern, MD  furosemide (LASIX) 20 MG tablet Take 1 tablet (20 mg total) by mouth daily. 06/01/22   Danford, Earl Lites, MD  insulin glargine (LANTUS) 100 UNIT/ML Solostar Pen Inject 8 Units into the skin daily. 05/31/22   Danford, Earl Lites, MD   Insulin Pen Needle (PEN NEEDLES) 31G X 5 MM MISC Use 3 (three) times daily. 05/31/22   Danford, Earl Lites, MD  lactulose (CHRONULAC) 10 GM/15ML solution Take 45 mLs (30 g total) by mouth daily. 06/01/22   Danford, Earl Lites, MD  mirtazapine (REMERON) 15 MG tablet Take 1 tablet (15 mg total) by mouth at bedtime. 04/07/22   Paul Northern, MD  pantoprazole (PROTONIX) 40 MG tablet Take 1 tablet (40 mg total) by mouth 2 (two) times daily before a meal. 12/28/21 05/23/22  Dahal, Melina Schools, MD  spironolactone (ALDACTONE) 25 MG tablet Take 1 tablet (25 mg total) by mouth daily. 06/01/22   Danford, Earl Lites, MD  tamsulosin (FLOMAX) 0.4 MG CAPS capsule Take 1 capsule (0.4 mg total) by mouth daily after supper. 04/03/22   Paul Northern, MD  thiamine (VITAMIN B1) 100 MG tablet Take 1 tablet (100 mg total) by mouth daily. 05/31/22   Danford, Earl Lites, MD  traZODone (DESYREL) 150 MG tablet Take 150 mg by mouth at bedtime. 04/07/22   [provider]    Physical Exam: Vitals:   07/23/22 0315 07/23/22 0358 07/23/22 0415 07/23/22 0454  BP:   (!) 162/89 (!) 151/83  Pulse: 100 95 (!) 101 (!) 109  Resp:   18 13  Temp:   98.3 F (36.8 C) 98.4 F (36.9 C)  TempSrc:   Oral Oral  SpO2: 95% 97% 97% 96%  Weight:      Height:  Constitutional: NAD, calm, comfortable, intoxicated Eyes: R pupil dilated markedly compared to left (chronic) Respiratory: clear to auscultation bilaterally, no wheezing, no crackles. Normal respiratory effort. No accessory muscle use.  Cardiovascular: Regular rate and rhythm, no murmurs / rubs / gallops. No extremity edema. 2+ pedal pulses. No carotid bruits.  Abdomen: no tenderness, no masses palpated. No hepatosplenomegaly. Bowel sounds positive.  Neurologic: CN 2-12 grossly intact. Sensation intact, DTR normal. Strength 5/5 in all 4.  Psychiatric: Normal judgment and insight. Alert and oriented x 3. Normal mood.   Data Reviewed:       Latest Ref Rng & Units 07/22/2022     9:59 PM 05/31/2022    5:00 AM 05/30/2022    6:10 AM  CBC  WBC 4.0 - 10.5 K/uL 2.1  2.6  2.0   Hemoglobin 13.0 - 17.0 g/dL 16.1  09.6  9.7   Hematocrit 39.0 - 52.0 % 40.1  32.8  30.7   Platelets 150 - 400 K/uL 10  42  32       Latest Ref Rng & Units 07/22/2022    9:59 PM 05/31/2022    5:00 AM 05/30/2022    6:10 AM  CMP  Glucose 70 - 99 mg/dL 045  409  811   BUN 6 - 20 mg/dL 5  8  6    Creatinine 0.61 - 1.24 mg/dL 9.14  7.82  9.56   Sodium 135 - 145 mmol/L 130  131  136   Potassium 3.5 - 5.1 mmol/L 3.6  4.0  3.9   Chloride 98 - 111 mmol/L 94  96  100   CO2 22 - 32 mmol/L 21  28  28    Calcium 8.9 - 10.3 mg/dL 7.9  8.3  8.4   Total Protein 6.5 - 8.1 g/dL 6.9     Total Bilirubin 0.3 - 1.2 mg/dL 1.4     Alkaline Phos 38 - 126 U/L 91     AST 15 - 41 U/L 135     ALT 0 - 44 U/L 83      EtOH 467  Assessment and Plan: * Alcohol intoxication (HCC) Pt continues to drink EtOH heavily.  Currently intoxicated. CIWA for when withdrawal develops. Day team may want to discuss whether we continue to readmit pt in future / pal care consult, the patient doesn't seem to be getting much benefit out of these repeated admissions as he continues to drink.  And has had very complicated and dangerous withdrawal symptoms last admit. I am concerned that until pt demonstrates ability to stop drinking EtOH on his own long enough to get his BAL down on presentation to ED, that risks of admission for withdrawal may out weigh the benefits.  Other pancytopenia (HCC) Pt with chronic pancytopenia and severe thrombocytopenia that is believed to be "secondary to splenomegaly and alcohol abuse " as per oncologist Dr. Terrace Lewis note in Oct 2023. Monitor CBC daily. Will transfuse 1u platelets given platelets < 10k due to risk of spontaneous bleeding Repeat CBC post transfusion Ended up having / developing bacteremia with MSSA last admit, watch for fevers, tachycardia, etc. HGB stable at 13 today  Cirrhosis of liver (HCC) Pt  with EtOH cirrhosis of liver, unfortunately continues to drink heavily. Mild transaminitis that looks to be baseline. Doesn't seem to be on any diuretics chronically, no lactulose, etc.  Liver mass Pt with known liver mass, suspected HCC, biopsy in Oct 2023 with just necrotic tissue.  Can't re biopsy because he's always thrombocytopenic  due to ongoing drinking whenever he comes in to hospital.  DM (diabetes mellitus), type 2 (HCC) Mod scale SSI AC/HS   MSSA bacteremia Recent MSSA bacteremia during last admit, completed 4 week treatment with ancef. Watch for re-development of fever / SIRS during admit Will check repeat BCx      Advance Care Planning:   Code Status: Full Code  Consults: None  Family Communication: No family in room  Severity of Illness: The appropriate patient status for this patient is INPATIENT. Inpatient status is judged to be reasonable and necessary in order to provide the required intensity of service to ensure the patient's safety. The patient's presenting symptoms, physical exam findings, and initial radiographic and laboratory data in the context of their chronic comorbidities is felt to place them at high risk for further clinical deterioration. Furthermore, it is not anticipated that the patient will be medically stable for discharge from the hospital within 2 midnights of admission.   * I certify that at the point of admission it is my clinical judgment that the patient will require inpatient hospital care spanning beyond 2 midnights from the point of admission due to high intensity of service, high risk for further deterioration and high frequency of surveillance required.*  Author: Hillary Bow., DO 07/23/2022 4:56 AM  For on call review [email or web address].

## 2022-07-23 NOTE — Assessment & Plan Note (Addendum)
Recent MSSA bacteremia during last admit, completed 4 week treatment with ancef. No evidence of active infection

## 2022-07-23 NOTE — ED Notes (Signed)
Pt spilled urinal after using.  Floor cleaned, patient and bed still dry.  Pt requesting something to drink and snack on.  Otherwise patient calm, resting, call bell in place, and denies further needs.

## 2022-07-23 NOTE — Assessment & Plan Note (Addendum)
Patient been placed on Accu-Cheks before every meal and nightly with sliding scale insulin Holding home regimen of hypoglycemics Increasing Semglee to 8 units nightly. Hemoglobin A1C 8.9%. Diabetic Diet

## 2022-07-23 NOTE — Assessment & Plan Note (Addendum)
Multifactorial secondary to alcoholic cirrhosis with portal hypertension/splenomegaly and ongoing myelosuppressive effects of alcohol use  Despite 3 separate platelet transfusions patient's platelet count continues to be quite low.   Evaluated by Dr. Mosetta Putt 5/3  and her assistance is greatly appreciated. Continue to monitor for evidence of bleeding Target platelet count of greater than 10,000 Monitoring with serial CBCs

## 2022-07-23 NOTE — Progress Notes (Signed)
SAME DAY PROGRESS NOTE   [name]  ZOX:[id number] DOB: 08/03/1970 DOA: 07/22/2022 PCP: Eloisa Northern, MD   Date of Service: the patient was seen and examined on 07/23/2022  Brief Narrative:  52 year old male with past medical history of alcohol abuse, alcoholic cirrhosis complicated by portal hypertension and pancytopenia, liver mass (biopsied 12/2021 with necrotic cells noted, this has yet to be repeated), hypertension, diabetes mellitus type 2 presenting to [facility] as a transfer from [facility] emergency department for alcohol withdrawal and thrombocytopenia.  Of note, patient was recently hospitalized from 2/29 until 3/9 at [facility] for alcohol withdrawal and pancytopenia.  During that hospitalization patient was found to be septic with MSSA bacteremia.  Exact source was unclear.   Patient was discharged with a midline to complete 4 weeks of total intravenous antibiotics until 4/3.  Upon evaluation in the emergency department on this presentation patient was found to be actively withdrawing with a blood alcohol level of 467.  Patient was also found to have substantial thrombocytopenia of 10, somewhat worsened from prior presentations.  The hospitalist group was called and patient was transferred to [facility] long hospital for continued medical care.  Patient did receive 1 pack of platelet transfusion the morning of 5/1 for severe thrombocytopenia.     Assessment and Plan: * Alcohol withdrawal syndrome without complication (HCC) Continuing to show clinical evidence of active withdrawal  Continuing CIWA protocol  Continue to dose patient with both scheduled benzodiazepines and as needed benzodiazepines for evidence of withdrawal.  Counseling on cessation daily Monitoring electrolytes closely and replacing as necessary  Pancytopenia, acquired (HCC) Multifactorial secondary to alcoholic cirrhosis with portal hypertension/splenomegaly and ongoing  myelosuppressive effects of alcohol use  Was seen by Dr. Terrace Arabia with oncology 12/2021  Continue to monitor for evidence of bleeding Patient is receiving 1 pack of platelets today Target platelet count of greater than 10,000 Monitoring with serial CBCs  Alcoholic cirrhosis of liver without ascites (HCC) Continuing heavy alcohol abuse drinking between 18-24 beers daily Counseling daily on cessation Monitoring transaminases, monitoring INR Overall prognosis guarded considering continued use  Liver mass Pt with known liver mass, possible about cellular carcinoma biopsy in Oct 2023 with just necrotic tissue Inability to repeat biopsy has been limited due to ongoing thrombocytopenia   DM (diabetes mellitus), type 2 (HCC) Patient been placed on Accu-Cheks before every meal and nightly with sliding scale insulin Holding home regimen of hypoglycemics Hemoglobin A1C ordered Diabetic Diet   MSSA bacteremia Recent MSSA bacteremia during last admit, completed 4 week treatment with ancef. No evidence of active infection     Subjective:  Patient complaining of ongoing tremors and diaphoresis.  Patient denies hallucinations.  Physical Exam:  Vitals:   07/23/22 1600 07/23/22 1700 07/23/22 1900 07/23/22 1930  BP: 136/66 139/77 136/73   Pulse: 87 96 92   Resp: (!) 24 (!) 24 (!) 23   Temp: 98.6 F (37 C)   (P) 98.6 F (37 C)  TempSrc: Oral   (P) Oral  SpO2: 91% 93% (!) 88%   Weight:      Height:        Constitutional: Patient is anxious, tremulous and diaphoretic. Skin: Diaphoretic, good skin turgor.   Eyes: Pupils are equally reactive to light.  No evidence of scleral icterus or conjunctival pallor.  ENMT: Moist mucous membranes noted.  Posterior pharynx clear of any exudate or lesions.   Respiratory: clear to auscultation bilaterally, no wheezing, no crackles. Normal respiratory  effort. No accessory muscle use.  Cardiovascular: Regular rate and rhythm, no murmurs / rubs / gallops.  No extremity edema. 2+ pedal pulses. No carotid bruits.  Abdomen: Abdomen is soft and nontender.  No evidence of intra-abdominal masses.  Positive bowel sounds noted in all quadrants.   Musculoskeletal: No joint deformity upper and lower extremities. Good ROM, no contractures. Normal muscle tone.    Data Reviewed:  I have personally reviewed and interpreted labs, imaging.  Significant findings are   CBC: Recent Labs  Lab 07/22/22 2159 07/23/22 0819  WBC 2.1* 3.0*  HGB 13.9 12.2*  HCT 40.1 36.7*  MCV 75.7* 78.4*  PLT 10* 13*   Basic Metabolic Panel: Recent Labs  Lab 07/22/22 2159 07/23/22 0819  NA 130* 133*  K 3.6 3.4*  CL 94* 98  CO2 21* 21*  GLUCOSE 259* 219*  BUN <5* <5*  CREATININE 0.61 0.54*  CALCIUM 7.9* 7.6*  MG  --  1.5*  PHOS  --  3.1   GFR: Estimated Creatinine Clearance: 99.8 mL/min (A) (by C-G formula based on SCr of 0.54 mg/dL (L)). Liver Function Tests: Recent Labs  Lab 07/22/22 2159  AST 135*  ALT 83*  ALKPHOS 91  BILITOT 1.4*  PROT 6.9  ALBUMIN 3.7      EKG/Telemetry: Personally reviewed.  Rhythm is normal sinus rhythm with heart rate of 95 bpm.  No dynamic ST segment changes appreciated.   Code Status:  Full code.  Code status decision has been confirmed with: Wife who is at the bedside Family Communication: Plan of care discussed with wife and mother who are both at the bedside.   Severity of Illness:  The appropriate patient status for this patient is INPATIENT. Inpatient status is judged to be reasonable and necessary in order to provide the required intensity of service to ensure the patient's safety. The patient's presenting symptoms, physical exam findings, and initial radiographic and laboratory data in the context of their chronic comorbidities is felt to place them at high risk for further clinical deterioration. Furthermore, it is not anticipated that the patient will be medically stable for discharge from the hospital within 2  midnights of admission.   * I certify that at the point of admission it is my clinical judgment that the patient will require inpatient hospital care spanning beyond 2 midnights from the point of admission due to high intensity of service, high risk for further deterioration and high frequency of surveillance required.*  Time spent:  63 minutes  Author:  Marinda Elk MD  07/23/2022 11:21 PM

## 2022-07-23 NOTE — ED Notes (Signed)
Pt given graham crackers and gatorade.  

## 2022-07-23 NOTE — ED Notes (Signed)
ED TO INPATIENT HANDOFF REPORT  ED Nurse Name and Phone #: Dom[place] [facility] Paramedic [phone]  S Name/Age/Gender [name] 52 y.o. male Room/Bed: MH02/MH02  Code Status   Code Status: Prior  Home/SNF/Other Home Patient oriented to: self, place, time, and situation Is this baseline? Yes   Triage Complete: Triage complete  Chief Complaint Alcohol intoxication [facility]) [F10.929]  Triage Note Patient arrived via POV c/o alcohol problem. Patient last drink x 3hr pta. Patient states 1 12 pack of beer daily. Patient is slurring speech in triage. Patient is AO x 4, VS w/ elevated BP and HR, unable to stand or walk safely at this time.   Allergies No Known Allergies  Level of Care/Admitting Diagnosis ED Disposition     ED Disposition  Admit   Condition  --   Comment  Hospital Area: [facility] [[id number]]  Level of Care: Stepdown [14]  Admit to SDU based on following criteria: Severe physiological/psychological symptoms:  Any diagnosis requiring assessment & intervention at least every 4 hours on an ongoing basis to obtain desired patient outcomes including stability and rehabilitation  May [facility] patient to [facility] or [facility] if equivalent level of care is available:: Yes  Interfacility transfer: Yes  Covid Evaluation: Asymptomatic - no recent exposure (last 10 days) testing not required  Diagnosis: Alcohol intoxication [facility]) [[id number]]  Admitting Physician: Darlin Drop [[id number]]  Attending Physician: Darlin Drop [[id number]]  Certification:: I certify this patient will need inpatient services for at least 2 midnights  Estimated Length of Stay: 2          B Medical/Surgery History Past Medical History:  Diagnosis Date   Alcoholic (HCC)    Anxiety    Diabetes (HCC)    DVT (deep venous thrombosis) (HCC)    Hypertension    Past Surgical History:  Procedure Laterality Date   BLOOD PATCH     ESOPHAGOGASTRODUODENOSCOPY N/A  12/26/2021   Procedure: ESOPHAGOGASTRODUODENOSCOPY (EGD);  Surgeon: Paul Salen, MD;  Location: [facility];  Service: Gastroenterology;  Laterality: N/A;   TONSILLECTOMY       A IV Location/Drains/Wounds Patient Lines/Drains/Airways Status     Active Line/Drains/Airways     Name Placement date Placement time Site Days   Peripheral IV 07/22/22 18 G Anterior;Distal;Right;Upper Arm 07/22/22  2158  Arm  1   Midline Single Lumen 05/30/22 Right Cephalic 8 cm 0 cm 05/30/22  2153  Cephalic  54            Intake/Output Last 24 hours  Intake/Output Summary (Last 24 hours) at 07/23/2022 0044 Last data filed at 07/22/2022 2310 Gross per 24 hour  Intake 1000.44 ml  Output --  Net 1000.44 ml    Labs/Imaging Results for orders placed or performed during the hospital encounter of 07/22/22 (from the past 48 hour(s))  Comprehensive metabolic panel     Status: Abnormal   Collection Time: 07/22/22  9:59 PM  Result Value Ref Range   Sodium 130 (L) 135 - 145 mmol/L   Potassium 3.6 3.5 - 5.1 mmol/L   Chloride 94 (L) 98 - 111 mmol/L   CO2 21 (L) 22 - 32 mmol/L   Glucose, Bld 259 (H) 70 - 99 mg/dL    Comment: Glucose reference range applies only to samples taken after fasting for at least 8 hours.   BUN <5 (L) 6 - 20 mg/dL   Creatinine, Ser 0.27 0.61 - 1.24 mg/dL   Calcium 7.9 (L) 8.9 -  10.3 mg/dL   Total Protein 6.9 6.5 - 8.1 g/dL   Albumin 3.7 3.5 - 5.0 g/dL   AST 161 (H) 15 - 41 U/L   ALT 83 (H) 0 - 44 U/L   Alkaline Phosphatase 91 38 - 126 U/L   Total Bilirubin 1.4 (H) 0.3 - 1.2 mg/dL   GFR, Estimated >09 >60 mL/min    Comment: (NOTE) Calculated using the CKD-EPI Creatinine Equation (2021)    Anion gap 15 5 - 15    Comment: Performed at [facility], [address] [place], [place] [address]  Ethanol     Status: Abnormal   Collection Time: 07/22/22  9:59 PM  Result Value Ref Range   Alcohol, Ethyl (B) 467 (HH) <10 mg/dL    Comment: CRITICAL RESULT CALLED TO, READ  BACK BY AND VERIFIED WITH ALFRED DILLARD RN AT 2234 ON 07/22/22 BY I.[email or web address] (NOTE) Lowest detectable limit for serum alcohol is 10 mg/dL.  For medical purposes only. Performed at [facility], [address] [place], [place] [address]   cbc     Status: Abnormal   Collection Time: 07/22/22  9:59 PM  Result Value Ref Range   WBC 2.1 (L) 4.0 - 10.5 K/uL   RBC 5.30 4.22 - 5.81 MIL/uL   Hemoglobin 13.9 13.0 - 17.0 g/dL   HCT 47.8 29.5 - 62.1 %   MCV 75.7 (L) 80.0 - 100.0 fL   MCH 26.2 26.0 - 34.0 pg   MCHC 34.7 30.0 - 36.0 g/dL   RDW 30.8 65.7 - 84.6 %   Platelets 10 (LL) 150 - 400 K/uL    Comment: SPECIMEN CHECKED FOR CLOTS Immature Platelet Fraction may be clinically indicated, consider ordering this additional test [id number] REPEATED TO VERIFY THIS CRITICAL RESULT HAS VERIFIED AND BEEN CALLED TO ALFRED DILLARD RN BY ISAAC SUGUT ON 04 30 2024 AT 2223, AND HAS BEEN READ BACK.     nRBC 0.0 0.0 - 0.2 %    Comment: Performed at [facility], [address] [place], [place] [address]  Rapid urine drug screen (hospital performed)     Status: None   Collection Time: 07/22/22  9:59 PM  Result Value Ref Range   Opiates NONE DETECTED NONE DETECTED   Cocaine NONE DETECTED NONE DETECTED   Benzodiazepines NONE DETECTED NONE DETECTED   Amphetamines NONE DETECTED NONE DETECTED   Tetrahydrocannabinol NONE DETECTED NONE DETECTED   Barbiturates NONE DETECTED NONE DETECTED    Comment: (NOTE) DRUG SCREEN FOR MEDICAL PURPOSES ONLY.  IF CONFIRMATION IS NEEDED FOR ANY PURPOSE, NOTIFY LAB WITHIN 5 DAYS.  LOWEST DETECTABLE LIMITS FOR URINE DRUG SCREEN Drug Class                     Cutoff (ng/mL) Amphetamine and metabolites    1000 Barbiturate and metabolites    200 Benzodiazepine                 200 Opiates and metabolites        300 Cocaine and metabolites        300 THC                            50 Performed at [facility], [address]  [place], [place] [address]   Ammonia     Status: None   Collection Time: 07/22/22  9:59 PM  Result Value Ref Range   Ammonia 29 9 - 35 umol/L    Comment: Performed at [facility], [address] [place], [place] [address]  Lipase, blood     Status: Abnormal   Collection Time: 07/22/22  9:59 PM  Result Value Ref Range   Lipase 82 (H) 11 - 51 U/L    Comment: Performed at [facility], [address] [place], [place] [address]  Urinalysis, Routine w reflex microscopic -Urine, Clean Catch     Status: Abnormal   Collection Time: 07/22/22  9:59 PM  Result Value Ref Range   Color, Urine YELLOW YELLOW   APPearance CLEAR CLEAR   Specific Gravity, Urine 1.015 1.005 - 1.030   pH 6.0 5.0 - 8.0   Glucose, UA >=500 (A) NEGATIVE mg/dL   Hgb urine dipstick LARGE (A) NEGATIVE   Bilirubin Urine NEGATIVE NEGATIVE   Ketones, ur NEGATIVE NEGATIVE mg/dL   Protein, ur >=829 (A) NEGATIVE mg/dL   Nitrite NEGATIVE NEGATIVE   Leukocytes,Ua NEGATIVE NEGATIVE    Comment: Performed at [facility], [address] [place], [place] [address]  Urinalysis, Microscopic (reflex)     Status: Abnormal   Collection Time: 07/22/22  9:59 PM  Result Value Ref Range   RBC / HPF >50 0 - 5 RBC/hpf   WBC, UA NONE SEEN 0 - 5 WBC/hpf   Bacteria, UA RARE (A) NONE SEEN   Squamous Epithelial / HPF 0-5 0 - 5 /HPF    Comment: Performed at [facility], [address] [place], [place] [address]   No results found.  Pending Labs Unresulted Labs (From admission, onward)    None       Vitals/Pain Today's Vitals   07/22/22 2039 07/22/22 2053 07/22/22 2202  BP: (!) 179/98  (!) 166/90  Pulse: (!) 118  (!) 103  Resp: 20    Temp: 98.6 F (37 C)    SpO2: 95%    Weight:  160 lb (72.6 kg)   Height:  5\' 7"  (1.702 m)   PainSc:  0-No pain     Isolation Precautions No active isolations  Medications Medications  sodium chloride 0.9 % bolus 1,000 mL ( Intravenous Stopped 07/22/22  2308)  LORazepam (ATIVAN) injection 2 mg (2 mg Intravenous Given 07/23/22 0002)  sodium chloride 0.9 % bolus 1,000 mL (1,000 mLs Intravenous New Bag/Given 07/23/22 0006)    Mobility walks     Focused Assessments     R Recommendations: See Admitting Provider Note  Report given to:   Additional Notes:

## 2022-07-23 NOTE — Assessment & Plan Note (Addendum)
heavy alcohol abuse drinking between 18-24 beers daily Counseling daily on cessation Monitoring transaminases, monitoring INR Child-Pugh class A with 6 points

## 2022-07-23 NOTE — Hospital Course (Addendum)
53 year old male with past medical history of alcohol abuse, alcoholic cirrhosis complicated by portal hypertension and pancytopenia, liver mass (biopsied 12/2021 with necrotic cells noted, this has yet to be repeated), hypertension, diabetes mellitus type 2 presenting to [facility] as a transfer from [facility] emergency department for alcohol withdrawal and thrombocytopenia.  Of note, patient was recently hospitalized from 2/29 until 3/9 at [facility] for alcohol withdrawal and pancytopenia.  During that hospitalization patient was found to be septic with MSSA bacteremia.  Exact source was unclear.   Patient was discharged with a midline to complete 4 weeks of total intravenous antibiotics until 4/3.  Upon evaluation in the emergency department on this presentation patient was found to be actively withdrawing with a blood alcohol level of 467.  Patient was also found to have substantial thrombocytopenia of 10, somewhat worsened from prior presentations.  The hospitalist group was called and patient was transferred to [facility] long hospital for continued medical care.  Patient has been exhibiting ongoing alcohol withdrawal throughout the hospitalization requiring frequent doses of benzodiazepines via CIWA protocol.  In the days that followed signs of withdrawal slowly improved and patient was able to be weaned off of a tapering regimen of benzodiazepines.  Patient did receive 1 pack of platelet transfusion the morning of 5/1, another unit pack of platelets on 5/2 and 1/3 pack of platelet transfused on 5/3 for persisting severe thrombocytopenia.  Due to this persistent thrombocytopenia Dr. Mosetta Putt with hematology was consulted who agreed with current management but it was likely secondary to splenomegaly and effects of alcohol and recommended ongoing continued alcohol cessation and conservative management.  She recommend that he follow-up with her in the outpatient setting in  approxi-1 week after discharge.  Patient was noted to be depressed and have untreated depression during this hospitalization.  Patient was placed on Lexapro 10 mg by mouth once daily and was advised to follow-up as an outpatient with [facility] for additional support from a behavioral health provider.  Patient was discharged home in improved and stable condition on 07/27/2022.

## 2022-07-24 DIAGNOSIS — F10930 Alcohol use, unspecified with withdrawal, uncomplicated: Secondary | ICD-10-CM | POA: Diagnosis not present

## 2022-07-24 DIAGNOSIS — E876 Hypokalemia: Secondary | ICD-10-CM

## 2022-07-24 DIAGNOSIS — D61818 Other pancytopenia: Secondary | ICD-10-CM | POA: Diagnosis not present

## 2022-07-24 DIAGNOSIS — R160 Hepatomegaly, not elsewhere classified: Secondary | ICD-10-CM | POA: Diagnosis not present

## 2022-07-24 DIAGNOSIS — K7030 Alcoholic cirrhosis of liver without ascites: Secondary | ICD-10-CM | POA: Diagnosis not present

## 2022-07-24 LAB — COMPREHENSIVE METABOLIC PANEL
ALT: 55 U/L — ABNORMAL HIGH (ref 0–44)
AST: 73 U/L — ABNORMAL HIGH (ref 15–41)
Albumin: 3 g/dL — ABNORMAL LOW (ref 3.5–5.0)
Alkaline Phosphatase: 64 U/L (ref 38–126)
Anion gap: 9 (ref 5–15)
BUN: 6 mg/dL (ref 6–20)
CO2: 24 mmol/L (ref 22–32)
Calcium: 7.7 mg/dL — ABNORMAL LOW (ref 8.9–10.3)
Chloride: 98 mmol/L (ref 98–111)
Creatinine, Ser: 0.59 mg/dL — ABNORMAL LOW (ref 0.61–1.24)
GFR, Estimated: >60 mL/min (ref >60)
Glucose, Bld: 186 mg/dL — ABNORMAL HIGH (ref 70–99)
Potassium: 3.4 mmol/L — ABNORMAL LOW (ref 3.5–5.1)
Sodium: 131 mmol/L — ABNORMAL LOW (ref 135–145)
Total Bilirubin: 1.9 mg/dL — ABNORMAL HIGH (ref 0.3–1.2)
Total Protein: 5.6 g/dL — ABNORMAL LOW (ref 6.5–8.1)

## 2022-07-24 LAB — GLUCOSE, CAPILLARY
Glucose-Capillary: 155 mg/dL — ABNORMAL HIGH (ref 70–99)
Glucose-Capillary: 148 mg/dL — ABNORMAL HIGH (ref 70–99)
Glucose-Capillary: 154 mg/dL — ABNORMAL HIGH (ref 70–99)
Glucose-Capillary: 163 mg/dL — ABNORMAL HIGH (ref 70–99)

## 2022-07-24 LAB — CBC
HCT: 31.2 % — ABNORMAL LOW (ref 39.0–52.0)
HCT: 31 % — ABNORMAL LOW (ref 39.0–52.0)
Hemoglobin: 10.5 g/dL — ABNORMAL LOW (ref 13.0–17.0)
Hemoglobin: 10.3 g/dL — ABNORMAL LOW (ref 13.0–17.0)
MCH: 26.7 pg (ref 26.0–34.0)
MCH: 26.1 pg (ref 26.0–34.0)
MCHC: 33.7 g/dL (ref 30.0–36.0)
MCHC: 33.2 g/dL (ref 30.0–36.0)
MCV: 79.4 fL — ABNORMAL LOW (ref 80.0–100.0)
MCV: 78.7 fL — ABNORMAL LOW (ref 80.0–100.0)
Platelets: 9 10*3/uL — CL (ref 150–400)
Platelets: 12 10*3/uL — CL (ref 150–400)
RBC: 3.93 MIL/uL — ABNORMAL LOW (ref 4.22–5.81)
RBC: 3.94 MIL/uL — ABNORMAL LOW (ref 4.22–5.81)
RDW: 15.1 % (ref 11.5–15.5)
RDW: 14.9 % (ref 11.5–15.5)
WBC: 1.4 10*3/uL — CL (ref 4.0–10.5)
WBC: 1.3 10*3/uL — CL (ref 4.0–10.5)
nRBC: 0 % (ref 0.0–0.2)
nRBC: 0 % (ref 0.0–0.2)

## 2022-07-24 LAB — CULTURE, BLOOD (ROUTINE X 2)
Culture: NO GROWTH
Special Requests: ADEQUATE

## 2022-07-24 LAB — BPAM PLATELET PHERESIS
Blood Product Expiration Date: 202405032359
ISSUE DATE / TIME: 202405010846
ISSUE DATE / TIME: 202405020559
Unit Type and Rh: 5100
Unit Type and Rh: 7300

## 2022-07-24 LAB — PHOSPHORUS: Phosphorus: 2.6 mg/dL (ref 2.5–4.6)

## 2022-07-24 LAB — HEMOGLOBIN A1C
Hgb A1c MFr Bld: 8.9 % — ABNORMAL HIGH (ref 4.8–5.6)
Mean Plasma Glucose: 208.73 mg/dL

## 2022-07-24 LAB — MAGNESIUM: Magnesium: 1.9 mg/dL (ref 1.7–2.4)

## 2022-07-24 LAB — PREPARE PLATELET PHERESIS

## 2022-07-24 LAB — APTT: aPTT: 28 seconds (ref 24–36)

## 2022-07-24 LAB — PROTIME-INR
INR: 1.3 — ABNORMAL HIGH (ref 0.8–1.2)
Prothrombin Time: 15.9 seconds — ABNORMAL HIGH (ref 11.4–15.2)

## 2022-07-24 MED ORDER — HYDRALAZINE HCL 20 MG/ML IJ SOLN: 10.0000 mg | Freq: Four times a day (QID) | INTRAMUSCULAR | Status: DC | PRN

## 2022-07-24 MED ORDER — INSULIN ASPART 100 UNIT/ML IJ SOLN
0.0000 [IU] | Freq: Three times a day (TID) | INTRAMUSCULAR | Status: DC
  Administered 2022-07-24: 3 [IU] via SUBCUTANEOUS
  Administered 2022-07-25: 2 [IU] via SUBCUTANEOUS
  Administered 2022-07-25 (×2): 3 [IU] via SUBCUTANEOUS
  Administered 2022-07-25 – 2022-07-26 (×2): 2 [IU] via SUBCUTANEOUS
  Administered 2022-07-26: 3 [IU] via SUBCUTANEOUS
  Administered 2022-07-26: 2 [IU] via SUBCUTANEOUS
  Administered 2022-07-26: 3 [IU] via SUBCUTANEOUS
  Administered 2022-07-27: 2 [IU] via SUBCUTANEOUS

## 2022-07-24 MED ORDER — SODIUM CHLORIDE 0.9% IV SOLUTION: Freq: Once | INTRAVENOUS | Status: AC

## 2022-07-24 MED ORDER — INSULIN GLARGINE-YFGN 100 UNIT/ML ~~LOC~~ SOLN
5.0000 [IU] | Freq: Every day | SUBCUTANEOUS | Status: DC
  Administered 2022-07-24: 5 [IU] via SUBCUTANEOUS
  Filled 2022-07-24 (×2): qty 0.05

## 2022-07-24 MED ORDER — POTASSIUM CHLORIDE CRYS ER 20 MEQ PO TBCR
40.0000 meq | EXTENDED_RELEASE_TABLET | Freq: Once | ORAL | Status: AC
  Administered 2022-07-24: 40 meq via ORAL
  Filled 2022-07-24: qty 2

## 2022-07-24 NOTE — Assessment & Plan Note (Addendum)
Recurrent Continuing to replace with potassium chloride Evaluating for concurrent hypomagnesemia  Monitoring potassium levels with serial chemistries.

## 2022-07-24 NOTE — Progress Notes (Signed)
    Patient Name: [name]           DOB: 06/23/1970  MRN: [id number]      Admission Date: 07/22/2022  Attending Provider: Marinda Elk, MD  Primary Diagnosis: Alcohol withdrawal syndrome without complication [facility])   Level of care: Stepdown    CROSS COVER NOTE   Date of Service   07/24/2022   [name], 52 y.o. male, was admitted on 07/22/2022 for Alcohol withdrawal syndrome without complication (HCC).    HPI/Events of Note   Platelet 13--> 9.  Patient has a history of pancytopenia, being followed by oncology. No reports of bleeding or bruising.  Asymptomatic.  Hemodynamically stable. Nursing staff to continue monitoring for evidence of bleeding. Target platelet count > 10,000   Interventions/ Plan   Transfuse 1 unit platelets        Anthoney Harada, DNP, ACNPC- AG [facility] [place]

## 2022-07-24 NOTE — Progress Notes (Signed)
PROGRESS NOTE   [name]  ZOX:[id number] DOB: [birth year] DOA: 07/22/2022 PCP: Eloisa Northern, MD   Date of Service: the patient was seen and examined on 07/24/2022  Brief Narrative:  52 year old male with past medical history of alcohol abuse, alcoholic cirrhosis complicated by portal hypertension and pancytopenia, liver mass (biopsied 12/2021 with necrotic cells noted, this has yet to be repeated), hypertension, diabetes mellitus type 2 presenting to [facility] as a transfer from [facility] emergency department for alcohol withdrawal and thrombocytopenia.  Of note, patient was recently hospitalized from 2/29 until 3/9 at [facility] for alcohol withdrawal and pancytopenia.  During that hospitalization patient was found to be septic with MSSA bacteremia.  Exact source was unclear.   Patient was discharged with a midline to complete 4 weeks of total intravenous antibiotics until 4/3.  Upon evaluation in the emergency department on this presentation patient was found to be actively withdrawing with a blood alcohol level of 467.  Patient was also found to have substantial thrombocytopenia of 10, somewhat worsened from prior presentations.  The hospitalist group was called and patient was transferred to [facility] long hospital for continued medical care.  Patient did receive 1 pack of platelet transfusion the morning of 5/1 and another unit pack of platelets on 5/2 for severe thrombocytopenia.  Patient has been exhibiting ongoing alcohol withdrawal throughout the hospitalization requiring frequent doses of benzodiazepines via CIWA protocol.     Assessment and Plan: * Alcohol withdrawal syndrome without complication (HCC) Continuing to show clinical evidence of active withdrawal  Continuing CIWA protocol  Continue to dose patient with both scheduled benzodiazepines and as needed benzodiazepines for evidence of withdrawal.  If withdrawal worsens will consider  addition of barbiturates or Precedex Counseling on cessation daily Monitoring electrolytes closely and replacing as necessary  Pancytopenia, acquired (HCC) Multifactorial secondary to alcoholic cirrhosis with portal hypertension/splenomegaly and ongoing myelosuppressive effects of alcohol use  Was seen by Dr. Terrace Arabia with oncology 12/2021  Continue to monitor for evidence of bleeding Patient received 1 pack of platelets on 5/1, an additional pack of platelets on 5/2. Target platelet count of greater than 10,000 Monitoring with serial CBCs  Alcoholic cirrhosis of liver without ascites (HCC) Continuing heavy alcohol abuse drinking between 18-24 beers daily Counseling daily on cessation Monitoring transaminases, monitoring INR Overall prognosis guarded considering continued use  Liver mass Pt with known liver mass, possible about cellular carcinoma biopsy in Oct 2023 with just necrotic tissue Inability to repeat biopsy has been limited due to ongoing thrombocytopenia   DM (diabetes mellitus), type 2 (HCC) Patient been placed on Accu-Cheks before every meal and nightly with sliding scale insulin Holding home regimen of hypoglycemics Placing patient on Semglee 5 units nightly Hemoglobin A1C 8.9%. Diabetic Diet   MSSA bacteremia Recent MSSA bacteremia during last admit, completed 4 week treatment with ancef. No evidence of active infection  Hypokalemia Replacing with potassium chloride Evaluating for concurrent hypomagnesemia  Monitoring potassium levels with serial chemistries.     Subjective:  Patient continues to complain of diaphoresis and mild tremor.  Patient denies any associated pain or loss of appetite.  Physical Exam:  Vitals:   07/24/22 1800 07/24/22 1900 07/24/22 1949 07/24/22 2000  BP: (!) 150/72 (!) 146/76    Pulse: 89 83 (!) 111   Resp: (!) 24 (!) 27    Temp:    97.8 F (36.6 C)  TempSrc:    Oral  SpO2: 93% 93%    Weight:  Height:         Constitutional: Lethargic but arousable, oriented x 3 patient exhibiting mild agitation.   Skin: Patient notably diaphoretic.  No rashes, no lesions, good skin turgor noted. Eyes: Pupils are equally reactive to light.  No evidence of scleral icterus or conjunctival pallor.  ENMT: Moist mucous membranes noted.  Posterior pharynx clear of any exudate or lesions.   Respiratory: clear to auscultation bilaterally, no wheezing, no crackles. Normal respiratory effort. No accessory muscle use.  Cardiovascular: Tachycardic rate with regular rhythm, no murmurs / rubs / gallops. No extremity edema. 2+ pedal pulses. No carotid bruits.  Abdomen: Abdomen is soft and nontender.  No evidence of intra-abdominal masses.  Positive bowel sounds noted in all quadrants.   Musculoskeletal: No joint deformity upper and lower extremities. Good ROM, no contractures. Normal muscle tone.  Neuro: Tremor at rest, patient following commands, patient moving all 4 extremities spontaneously   Data Reviewed:  I have personally reviewed and interpreted labs, imaging.  Significant findings are   CBC: Recent Labs  Lab 07/22/22 2159 07/23/22 0819 07/24/22 0251 07/24/22 0953  WBC 2.1* 3.0* 1.4* 1.3*  HGB 13.9 12.2* 10.5* 10.3*  HCT 40.1 36.7* 31.2* 31.0*  MCV 75.7* 78.4* 79.4* 78.7*  PLT 10* 13* 9* 12*   Basic Metabolic Panel: Recent Labs  Lab 07/22/22 2159 07/23/22 0819 07/24/22 0251  NA 130* 133* 131*  K 3.6 3.4* 3.4*  CL 94* 98 98  CO2 21* 21* 24  GLUCOSE 259* 219* 186*  BUN <5* <5* 6  CREATININE 0.61 0.54* 0.59*  CALCIUM 7.9* 7.6* 7.7*  MG  --  1.5* 1.9  PHOS  --  3.1 2.6   GFR: Estimated Creatinine Clearance: 99.8 mL/min (A) (by C-G formula based on SCr of 0.59 mg/dL (L)). Liver Function Tests: Recent Labs  Lab 07/22/22 2159 07/24/22 0251  AST 135* 73*  ALT 83* 55*  ALKPHOS 91 64  BILITOT 1.4* 1.9*  PROT 6.9 5.6*  ALBUMIN 3.7 3.0*    Coagulation Profile: Recent Labs  Lab  07/24/22 0251  INR 1.3*     Telemetry: Personally reviewed.  Rhythm is sinus tachycardia with heart rate of 110 bpm.  No dynamic ST segment changes appreciated.   Code Status:  Full code.  Code status decision has been confirmed with: patient Family Communication: Wife and mother at the bedside on 5/1.   Severity of Illness:  The appropriate patient status for this patient is INPATIENT. Inpatient status is judged to be reasonable and necessary in order to provide the required intensity of service to ensure the patient's safety. The patient's presenting symptoms, physical exam findings, and initial radiographic and laboratory data in the context of their chronic comorbidities is felt to place them at high risk for further clinical deterioration. Furthermore, it is not anticipated that the patient will be medically stable for discharge from the hospital within 2 midnights of admission.   * I certify that at the point of admission it is my clinical judgment that the patient will require inpatient hospital care spanning beyond 2 midnights from the point of admission due to high intensity of service, high risk for further deterioration and high frequency of surveillance required.*  Time spent:  55 minutes  Author:  Marinda Elk MD  07/24/2022 8:16 PM

## 2022-07-24 NOTE — Plan of Care (Signed)

## 2022-07-24 NOTE — TOC Initial Note (Addendum)
Transition of Care [facility]) - Initial/Assessment Note    Patient Details  Name: [name] MRN: [id number] Date of Birth: 1971-03-10  Transition of Care [facility]) CM/SW Contact:    Coralyn Helling, LCSW Phone Number: 07/24/2022, 11:18 AM  Clinical Narrative:       TOC consulted for ETOH resources. TOC met with patient at bedside. 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      Patient states he drinks 12-18 beers a day. Denies drinking liquor. Patient reports, " I'm done with drinking. I learned my lesson." Patient reports he has successfully quit in the past for 19.5 years. He reports he doesn't know what happened. " One day I went tn the store and picked it up and never put it back down." Patient reports his spouse is supportive. Patient declined resources and says he can " do it on is own".   TOC signing off.    Expected Discharge Plan: Home/Self Care Barriers to Discharge: Continued Medical Work up   Patient Goals and CMS Choice Patient states their goals for this hospitalization and ongoing recovery are:: Go home and stop drinking   Choice offered to / list presented to : NA      Expected Discharge Plan and Services In-house Referral: NA Discharge Planning Services: NA   Living arrangements for the past 2 months: Single Family Home                 DME Arranged: N/A DME Agency: NA       HH Arranged: NA HH Agency: NA        Prior Living Arrangements/Services Living arrangements for the past 2 months: Single Family Home Lives with:: Spouse Patient language and need for interpreter reviewed:: Yes Do you feel safe going back to the place where you live?: Yes      Need for Family Participation in Patient Care: No (Comment) Care giver support system in place?: Yes (comment)   Criminal Activity/Legal Involvement Pertinent to Current Situation/Hospitalization: No - Comment as needed  Activities of  Daily Living Home Assistive Devices/Equipment: None ADL Screening (condition at time of admission) Patient's cognitive ability adequate to safely complete daily activities?: Yes Is the patient deaf or have difficulty hearing?: No Does the patient have difficulty seeing, even when wearing glasses/contacts?: No Does the patient have difficulty concentrating, remembering, or making decisions?: Yes Patient able to express need for assistance with ADLs?: Yes Does the patient have difficulty dressing or bathing?: No Independently performs ADLs?: Yes (appropriate for developmental age) Does the patient have difficulty walking or climbing stairs?: No Weakness of Legs: None Weakness of Arms/Hands: None  Permission Sought/Granted   Permission granted to share information with : No              Emotional Assessment Appearance:: Appears stated age Attitude/Demeanor/Rapport: Engaged Affect (typically observed): Accepting, Hopeful Orientation: : Oriented to Self, Oriented to Place, Oriented to  Time, Oriented to Situation Alcohol / Substance Use: Alcohol Use Psych Involvement: No (comment)  Admission diagnosis:  Alcohol intoxication (HCC) [F10.929] Thrombocytopenia (HCC) [D69.6] Acute alcoholic intoxication in alcoholism with complication [facility]) [F10.229] Patient Active Problem List   Diagnosis Date Noted   PICC (peripherally inserted central catheter) in place 06/24/2022   Medication management 06/24/2022   MSSA bacteremia 05/27/2022   UTI (urinary tract infection) 05/26/2022   Hypophosphatemia 05/25/2022   Hyponatremia 05/25/2022   Hypoxia 05/25/2022   History  of eye trauma 05/23/2022   Hematoma of right flank 05/23/2022   Recurrent falls 05/22/2022   Psychophysiological insomnia 04/03/2022   Benign prostatic hyperplasia (BPH) with straining on urination 04/03/2022   Pancytopenia, acquired (HCC) 02/03/2022   Liver mass    Low TSH level 12/25/2021   GIB (gastrointestinal bleeding)  12/24/2021   DM (diabetes mellitus), type 2 (HCC) 12/24/2021   Alcohol withdrawal syndrome without complication (HCC) 12/12/2020   Substance induced mood disorder (HCC) 12/09/2020   Hypokalemia 08/15/2012   Hypomagnesemia 08/15/2012   Other and unspecified hyperlipidemia 08/15/2012   Encephalopathy acute 08/09/2012   Alcohol withdrawal with complication with inpatient treatment (HCC) 08/08/2012   Portal hypertension (HCC) 08/08/2012   Splenomegaly 08/08/2012   Alcoholic hepatitis without ascites 08/08/2012   Hiatal hernia 08/08/2012   Elevated blood alcohol level 08/06/2012   Acute pancreatitis 08/06/2012   Alcoholic cirrhosis of liver without ascites (HCC) 08/06/2012   Thrombocytopenia (HCC) 08/06/2012   Hypertension    Anxiety    DVT (deep venous thrombosis) (HCC)    PCP:  Eloisa Northern, MD Pharmacy:   [id number] [place], [place] - [address] AT [facility] OF [facility] & [address] [place] [address] Phone: [phone] Fax: [phone]  [id number] - [place], [place] - [address] AT [facility] [address] [place] [address] Phone: [phone] Fax: [phone]  [facility] [address] [place] [address] Phone: [phone] Fax: [phone]     Social Determinants of Health (SDOH) Social History: SDOH Screenings   Food Insecurity: No Food Insecurity (05/24/2022)  Housing: Low Risk  (05/24/2022)  Transportation Needs: No Transportation Needs (05/24/2022)  Utilities: Not At Risk (05/24/2022)  Alcohol Screen: High Risk (12/09/2020)  Depression (PHQ2-9): Low Risk  (06/24/2022)  Tobacco Use: Low Risk  (07/22/2022)   SDOH Interventions:     Readmission Risk Interventions     No data to display

## 2022-07-25 DIAGNOSIS — K7030 Alcoholic cirrhosis of liver without ascites: Secondary | ICD-10-CM | POA: Diagnosis not present

## 2022-07-25 DIAGNOSIS — D61818 Other pancytopenia: Secondary | ICD-10-CM | POA: Diagnosis not present

## 2022-07-25 DIAGNOSIS — R160 Hepatomegaly, not elsewhere classified: Secondary | ICD-10-CM | POA: Diagnosis not present

## 2022-07-25 DIAGNOSIS — F329 Major depressive disorder, single episode, unspecified: Secondary | ICD-10-CM | POA: Diagnosis present

## 2022-07-25 DIAGNOSIS — F10930 Alcohol use, unspecified with withdrawal, uncomplicated: Secondary | ICD-10-CM | POA: Diagnosis not present

## 2022-07-25 LAB — GLUCOSE, CAPILLARY
Glucose-Capillary: 165 mg/dL — ABNORMAL HIGH (ref 70–99)
Glucose-Capillary: 137 mg/dL — ABNORMAL HIGH (ref 70–99)
Glucose-Capillary: 173 mg/dL — ABNORMAL HIGH (ref 70–99)
Glucose-Capillary: 145 mg/dL — ABNORMAL HIGH (ref 70–99)

## 2022-07-25 LAB — BPAM PLATELET PHERESIS
Blood Product Expiration Date: 202405042359
ISSUE DATE / TIME: 202405031241

## 2022-07-25 LAB — BASIC METABOLIC PANEL
Anion gap: 11 (ref 5–15)
BUN: 7 mg/dL (ref 6–20)
CO2: 21 mmol/L — ABNORMAL LOW (ref 22–32)
Calcium: 8.2 mg/dL — ABNORMAL LOW (ref 8.9–10.3)
Chloride: 100 mmol/L (ref 98–111)
Creatinine, Ser: 0.6 mg/dL — ABNORMAL LOW (ref 0.61–1.24)
GFR, Estimated: >60 mL/min (ref >60)
Glucose, Bld: 121 mg/dL — ABNORMAL HIGH (ref 70–99)
Potassium: 3 mmol/L — ABNORMAL LOW (ref 3.5–5.1)
Sodium: 132 mmol/L — ABNORMAL LOW (ref 135–145)

## 2022-07-25 LAB — CBC
HCT: 32.8 % — ABNORMAL LOW (ref 39.0–52.0)
Hemoglobin: 11 g/dL — ABNORMAL LOW (ref 13.0–17.0)
MCH: 26.4 pg (ref 26.0–34.0)
MCHC: 33.5 g/dL (ref 30.0–36.0)
MCV: 78.8 fL — ABNORMAL LOW (ref 80.0–100.0)
Platelets: 9 10*3/uL — CL (ref 150–400)
RBC: 4.16 MIL/uL — ABNORMAL LOW (ref 4.22–5.81)
RDW: 15.3 % (ref 11.5–15.5)
WBC: 1.7 10*3/uL — ABNORMAL LOW (ref 4.0–10.5)
nRBC: 0 % (ref 0.0–0.2)

## 2022-07-25 LAB — CULTURE, BLOOD (ROUTINE X 2): Special Requests: ADEQUATE

## 2022-07-25 LAB — PREPARE PLATELET PHERESIS: Unit division: 0

## 2022-07-25 MED ORDER — CHLORDIAZEPOXIDE HCL 25 MG PO CAPS
25.0000 mg | ORAL_CAPSULE | Freq: Three times a day (TID) | ORAL | Status: AC
  Administered 2022-07-25 – 2022-07-26 (×3): 25 mg via ORAL
  Filled 2022-07-25 (×3): qty 1

## 2022-07-25 MED ORDER — CHLORDIAZEPOXIDE HCL 25 MG PO CAPS
25.0000 mg | ORAL_CAPSULE | Freq: Two times a day (BID) | ORAL | Status: AC
  Administered 2022-07-26 – 2022-07-27 (×2): 25 mg via ORAL
  Filled 2022-07-25 (×2): qty 1

## 2022-07-25 MED ORDER — ESCITALOPRAM OXALATE 10 MG PO TABS
10.0000 mg | ORAL_TABLET | Freq: Every day | ORAL | Status: DC
  Administered 2022-07-25 – 2022-07-27 (×3): 10 mg via ORAL
  Filled 2022-07-25 (×3): qty 1

## 2022-07-25 MED ORDER — SODIUM CHLORIDE 0.9% IV SOLUTION: Freq: Once | INTRAVENOUS | Status: AC

## 2022-07-25 MED ORDER — POTASSIUM CHLORIDE CRYS ER 20 MEQ PO TBCR
40.0000 meq | EXTENDED_RELEASE_TABLET | Freq: Two times a day (BID) | ORAL | Status: AC
  Administered 2022-07-25 (×2): 40 meq via ORAL
  Filled 2022-07-25: qty 2

## 2022-07-25 MED ORDER — INSULIN GLARGINE-YFGN 100 UNIT/ML ~~LOC~~ SOLN
8.0000 [IU] | Freq: Every day | SUBCUTANEOUS | Status: DC
  Administered 2022-07-25: 8 [IU] via SUBCUTANEOUS
  Filled 2022-07-25 (×2): qty 0.08

## 2022-07-25 MED ORDER — CHLORDIAZEPOXIDE HCL 25 MG PO CAPS
25.0000 mg | ORAL_CAPSULE | Freq: Once | ORAL | Status: AC
  Administered 2022-07-27: 25 mg via ORAL
  Filled 2022-07-25: qty 1

## 2022-07-25 NOTE — Progress Notes (Signed)
[name]   DOB:Sep 21, 1970   ZO#:[id number]   JWJ#:[id number]  Hem/onc follow up   Subjective: pt is known to me, last seen by me in hospital in March 2024 when he was admitted for alcohol withdrawal and severe thrombocytopenia. He was admitted to hospital 2 days ago with similar presentation.  His platelet has been around 10K, he had mild epistaxis before admission, received 2 units of platelet transfusion, no active bleeding since admission.  He is awake and alert when I saw him, and he regret what he did with alcohol, and wants to stop.    Objective:  Vitals:   07/25/22 1508 07/25/22 1600  BP: (!) 146/79 (!) 141/73  Pulse: 88 84  Resp: (!) 21 20  Temp: 98.1 F (36.7 C) 98 F (36.7 C)  SpO2: 97% 94%    Body mass index is 25.06 kg/m.  Intake/Output Summary (Last 24 hours) at 07/25/2022 1700 Last data filed at 07/25/2022 1508 Gross per 24 hour  Intake 492 ml  Output 400 ml  Net 92 ml     Sclerae unicteric  Oropharynx clear  No peripheral adenopathy  Lungs clear -- no rales or rhonchi  Heart regular rate and rhythm  Abdomen benign  MSK no focal spinal tenderness, no peripheral edema  Neuro nonfocal  No significant ecchymosis or petechiae.  CBG (last 3)  Recent Labs    07/25/22 0722 07/25/22 1202 07/25/22 1649  GLUCAP 165* 137* 173*     Labs:  Urine Studies No results for input(s): "UHGB", "CRYS" in the last 72 hours.  Invalid input(s): "UACOL", "UAPR", "USPG", "UPH", "UTP", "UGL", "UKET", "UBIL", "UNIT", "UROB", "ULEU", "UEPI", "UWBC", "URBC", "UBAC", "CAST", "UCOM", "BILUA"  Basic Metabolic Panel: Recent Labs  Lab 07/22/22 2159 07/23/22 0819 07/24/22 0251 07/25/22 1029  NA 130* 133* 131* 132*  K 3.6 3.4* 3.4* 3.0*  CL 94* 98 98 100  CO2 21* 21* 24 21*  GLUCOSE 259* 219* 186* 121*  BUN <5* <5* 6 7  CREATININE 0.61 0.54* 0.59* 0.60*  CALCIUM 7.9* 7.6* 7.7* 8.2*  MG  --  1.5* 1.9  --   PHOS  --  3.1 2.6  --    GFR Estimated Creatinine  Clearance: 99.8 mL/min (A) (by C-G formula based on SCr of 0.6 mg/dL (L)). Liver Function Tests: Recent Labs  Lab 07/22/22 2159 07/24/22 0251  AST 135* 73*  ALT 83* 55*  ALKPHOS 91 64  BILITOT 1.4* 1.9*  PROT 6.9 5.6*  ALBUMIN 3.7 3.0*   Recent Labs  Lab 07/22/22 2159  LIPASE 82*   Recent Labs  Lab 07/22/22 2159  AMMONIA 29   Coagulation profile Recent Labs  Lab 07/24/22 0251  INR 1.3*    CBC: Recent Labs  Lab 07/22/22 2159 07/23/22 0819 07/24/22 0251 07/24/22 0953 07/25/22 1029  WBC 2.1* 3.0* 1.4* 1.3* 1.7*  HGB 13.9 12.2* 10.5* 10.3* 11.0*  HCT 40.1 36.7* 31.2* 31.0* 32.8*  MCV 75.7* 78.4* 79.4* 78.7* 78.8*  PLT 10* 13* 9* 12* 9*   Cardiac Enzymes: No results for input(s): "CKTOTAL", "CKMB", "CKMBINDEX", "TROPONINI" in the last 168 hours. BNP: Invalid input(s): "POCBNP" CBG: Recent Labs  Lab 07/24/22 1645 07/24/22 1951 07/25/22 0722 07/25/22 1202 07/25/22 1649  GLUCAP 154* 163* 165* 137* 173*   D-Dimer No results for input(s): "DDIMER" in the last 72 hours. Hgb A1c Recent Labs    07/24/22 0251  HGBA1C 8.9*   Lipid Profile No results for input(s): "CHOL", "HDL", "LDLCALC", "TRIG", "CHOLHDL", "LDLDIRECT" in  the last 72 hours. Thyroid function studies No results for input(s): "TSH", "T4TOTAL", "T3FREE", "THYROIDAB" in the last 72 hours.  Invalid input(s): "FREET3" Anemia work up No results for input(s): "VITAMINB12", "FOLATE", "FERRITIN", "TIBC", "IRON", "RETICCTPCT" in the last 72 hours. Microbiology Recent Results (from the past 240 hour(s))  Culture, blood (Routine X 2) w Reflex to ID Panel     Status: None (Preliminary result)   Collection Time: 07/23/22  6:30 AM   Specimen: BLOOD  Result Value Ref Range Status   Specimen Description   Final    BLOOD BLOOD LEFT ARM Performed at [facility], [address], [place], [place] [address]    Special Requests   Final    BOTTLES DRAWN AEROBIC AND ANAEROBIC Blood  Culture adequate volume Performed at [facility], [address], [place], [place] [address]    Culture   Final    NO GROWTH 2 DAYS Performed at [facility], [address], [place], [place] [address]    Report Status PENDING  Incomplete  Culture, blood (Routine X 2) w Reflex to ID Panel     Status: None (Preliminary result)   Collection Time: 07/23/22  6:44 AM   Specimen: BLOOD  Result Value Ref Range Status   Specimen Description   Final    BLOOD BLOOD LEFT HAND Performed at [facility], [address] [place], [place] [address]    Special Requests   Final    BOTTLES DRAWN AEROBIC AND ANAEROBIC Blood Culture adequate volume Performed at [facility], [address], [place], [place] [address]    Culture   Final    NO GROWTH 2 DAYS Performed at [facility], [address] [place], [place] [address]    Report Status PENDING  Incomplete      Studies:  No results found.  Assessment: 51 y.o.male   Alcohol withdrawal, improved Alcohol liver cirrhosis with ascites Pancytopenia, severe thrombocytopenia secondary to liver cirrhosis, splenomegaly and alcohol toxicity  liver mass, previous biopsy negative Diabetes History of MSSA bacteremia    Plan:  -Lab reviewed, he has severe thrombocytopenia, most likely from his liver cirrhosis, splenomegaly, and alcohol toxicity.  He had a very similar hospital course a few months ago, his platelet was able to recover to about 40K on discharge. -I recommend platelet transfusion if platelet less than 10K -He had multiple no-shows in my office, I again reviewed the importance of follow-up, especially for his liver mass.  This appears to be stable on previous CT scan on 05/22/2022. I will repeat AFP tomorrow  -No additional hem/onc workup needed during his hospital stay. I will f/u as needed.  -I will schedule him to see me back in 2 to 4 weeks.   Malachy Mood,  MD 07/25/2022  5:00 PM

## 2022-07-25 NOTE — Evaluation (Signed)
Physical Therapy Evaluation Patient Details Name: [name] MRN: [id number] DOB: 05-19-1970 Today's Date: 07/25/2022  History of Present Illness  Pt is 52 yo admitted 07/22/22 with alcohol intoxication, pancytopenia, cirrhosis, and liver mass.  Pt with recent hospitalization for the same.  He has received 2 packets of platelets this hospitalization.  Pt with hx including but not limited to alcohol abuse, alcoholic cirrhosis complicated by portal hypertension and pancytopenia, liver mass (biopsied 12/2021 with necrotic cells noted, this has yet to be repeated), hypertension, diabetes mellitus type 2  Clinical Impression  Pt admitted with above diagnosis. At baseline, pt working and independent.  He does not have DME but has support at home.  Today, pt supervision for bed mobiity and min guard to ambulate.  He walked 400' without AD but did have 4-5 loss of balance recovering with min guard.  Improvement in balance with cues for focus point.  Pt expected to progress well with PT and likely no needs at d/c.  Pt may benefit from AD pending progress.  Pt currently with functional limitations due to the deficits listed below (see PT Problem List). Pt will benefit from acute skilled PT to increase their independence and safety with mobility to allow discharge.          Recommendations for follow up therapy are one component of a multi-disciplinary discharge planning process, led by the attending physician.  Recommendations may be updated based on patient status, additional functional criteria and insurance authorization.  Follow Up Recommendations       Assistance Recommended at Discharge Intermittent Supervision/Assistance  Patient can return home with the following  A little help with walking and/or transfers;A little help with bathing/dressing/bathroom;Assistance with cooking/housework;Help with stairs or ramp for entrance    Equipment Recommendations Rolling walker (2 wheels)   Recommendations for Other Services       Functional Status Assessment Patient has had a recent decline in their functional status and demonstrates the ability to make significant improvements in function in a reasonable and predictable amount of time.     Precautions / Restrictions Precautions Precautions: Fall      Mobility  Bed Mobility Overal bed mobility: Needs Assistance Bed Mobility: Supine to Sit, Sit to Supine     Supine to sit: Supervision Sit to supine: Supervision   General bed mobility comments: increased time and effort    Transfers Overall transfer level: Needs assistance Equipment used: None Transfers: Sit to/from Stand Sit to Stand: Min guard                Ambulation/Gait Ambulation/Gait assistance: Min guard Gait Distance (Feet): 400 Feet Assistive device: None Gait Pattern/deviations: Step-through pattern, Drifts right/left Gait velocity: decreased     General Gait Details: Occasional drift R/L requiring min guard for safety  Stairs            Wheelchair Mobility    Modified Rankin (Stroke Patients Only)       Balance Overall balance assessment: Needs assistance Sitting-balance support: No upper extremity supported Sitting balance-Leahy Scale: Good     Standing balance support: No upper extremity supported Standing balance-Leahy Scale: Fair Standing balance comment: Can ambulate without AD but does drift                             Pertinent Vitals/Pain Pain Assessment Pain Assessment: No/denies pain    Home Living Family/patient expects to be discharged to:: Private residence Living Arrangements: Spouse/significant  other Available Help at Discharge: Family;Available PRN/intermittently Type of Home: House Home Access: Level entry       Home Layout: One level Home Equipment: None      Prior Function Prior Level of Function : Independent/Modified Independent;Working/employed;Driving              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)       Hand Dominance        Extremity/Trunk Assessment   Upper Extremity Assessment Upper Extremity Assessment: Overall WFL for tasks assessed    Lower Extremity Assessment Lower Extremity Assessment: Overall WFL for tasks assessed (Did not MMT due to low platelets)    Cervical / Trunk Assessment Cervical / Trunk Assessment: Normal  Communication   Communication: No difficulties  Cognition Arousal/Alertness: Awake/alert Behavior During Therapy: WFL for tasks assessed/performed Overall Cognitive Status: Within Functional Limits for tasks assessed                                          General Comments      Exercises     Assessment/Plan    PT Assessment Patient needs continued PT services  PT Problem List Decreased strength;Decreased cognition;Decreased activity tolerance;Decreased safety awareness;Decreased balance;Decreased mobility;Decreased knowledge of use of DME       PT Treatment Interventions DME instruction;Therapeutic exercise;Gait training;Stair training;Functional mobility training;Therapeutic activities;Patient/family education;Neuromuscular re-education;Balance training    PT Goals (Current goals can be found in the Care Plan section)  Acute Rehab PT Goals Patient Stated Goal: return home PT Goal Formulation: With patient Time For Goal Achievement: 08/08/22 Potential to Achieve Goals: Good    Frequency Min 1X/week     Co-evaluation               AM-PAC PT "6 Clicks" Mobility  Outcome Measure Help needed turning from your back to your side while in a flat bed without using bedrails?: None Help needed moving from lying on your back to sitting on the side of a flat bed without using bedrails?: A Little Help needed moving to and from a bed to a chair (including a wheelchair)?: A Little Help needed standing up from a chair using  your arms (e.g., wheelchair or bedside chair)?: A Little Help needed to walk in hospital room?: A Little Help needed climbing 3-5 steps with a railing? : A Little 6 Click Score: 19    End of Session Equipment Utilized During Treatment: Gait belt Activity Tolerance: Patient tolerated treatment well Patient left: in bed;with call bell/phone within reach;with bed alarm set Nurse Communication: Mobility status PT Visit Diagnosis: Other abnormalities of gait and mobility (R26.89)    Time: 4098-1191 PT Time Calculation (min) (ACUTE ONLY): 20 min   Charges:   PT Evaluation $PT Eval Low Complexity: 1 Low          Mardell Cragg, PT Acute Rehab [facility] [phone]   Rayetta Humphrey 07/25/2022, 3:08 PM

## 2022-07-25 NOTE — Progress Notes (Signed)
PROGRESS NOTE   [name]  WGN:[id number] DOB: 05-04-1970 DOA: 07/22/2022 PCP: Eloisa Northern, MD   Date of Service: the patient was seen and examined on 07/25/2022  Brief Narrative:  52 year old male with past medical history of alcohol abuse, alcoholic cirrhosis complicated by portal hypertension and pancytopenia, liver mass (biopsied 12/2021 with necrotic cells noted, this has yet to be repeated), hypertension, diabetes mellitus type 2 presenting to [facility] as a transfer from [facility] emergency department for alcohol withdrawal and thrombocytopenia.  Of note, patient was recently hospitalized from 2/29 until 3/9 at [facility] for alcohol withdrawal and pancytopenia.  During that hospitalization patient was found to be septic with MSSA bacteremia.  Exact source was unclear.   Patient was discharged with a midline to complete 4 weeks of total intravenous antibiotics until 4/3.  Upon evaluation in the emergency department on this presentation patient was found to be actively withdrawing with a blood alcohol level of 467.  Patient was also found to have substantial thrombocytopenia of 10, somewhat worsened from prior presentations.  The hospitalist group was called and patient was transferred to [facility] long hospital for continued medical care.  Patient did receive 1 pack of platelet transfusion the morning of 5/1 and another unit pack of platelets on 5/2 for severe thrombocytopenia.  Patient has been exhibiting ongoing alcohol withdrawal throughout the hospitalization requiring frequent doses of benzodiazepines via CIWA protocol.     Assessment and Plan: * Alcohol withdrawal syndrome without complication (HCC) Continuing to show some clinical evidence of active withdrawal albeit improving Continuing CIWA protocol  Continue to dose patient with both scheduled benzodiazepines and as needed benzodiazepines for evidence of withdrawal.  Counseling on  cessation daily Monitoring electrolytes closely and replacing as necessary  Pancytopenia, acquired (HCC) Multifactorial secondary to alcoholic cirrhosis with portal hypertension/splenomegaly and ongoing myelosuppressive effects of alcohol use  Despite 2 separate platelet transfusions patient's platelet count continues to be quite low.  Will proceed with a third platelet transfusion today. Case discussed with Dr. Mosetta Putt whom the patient is followed up with in the outpatient setting in the past.  She is evaluating the patient in consultation and her assistance is greatly appreciated. Continue to monitor for evidence of bleeding Target platelet count of greater than 10,000 Monitoring with serial CBCs  Alcoholic cirrhosis of liver without ascites (HCC) Continuing heavy alcohol abuse drinking between 18-24 beers daily Counseling daily on cessation Monitoring transaminases, monitoring INR Child-Pugh class A with 6 points   Liver mass Pt with known liver mass, possible hepatocellular carcinoma biopsy in Oct 2023 with just necrotic tissue Inability to repeat biopsy has been limited due to ongoing thrombocytopenia  Patient will need continued imaging surveillance every 3 months in the absence of the ability to do a biopsy.  DM (diabetes mellitus), type 2 (HCC) Patient been placed on Accu-Cheks before every meal and nightly with sliding scale insulin Holding home regimen of hypoglycemics Increasing Semglee to 8 units nightly. Hemoglobin A1C 8.9%. Diabetic Diet   MSSA bacteremia Recent MSSA bacteremia during last admit, completed 4 week treatment with ancef. No evidence of active infection  Hypokalemia Recurrent Continuing to replace with potassium chloride Evaluating for concurrent hypomagnesemia  Monitoring potassium levels with serial chemistries.   Major depressive disorder Longstanding symptoms of decreased energy, tearfulness, feelings of worthlessness Patient denies suicidal or  homicidal ideation Placing patient on Lexapro 10 mg daily Patient should additionally follow-up with behavioral health after discharge    Subjective:  Patient  states that he is feeling much better with improving strength and decreasing tremors.  Physical Exam:  Vitals:   07/25/22 1700 07/25/22 1800 07/25/22 1900 07/25/22 2005  BP: (!) 160/89 (!) 159/90 (!) 148/80   Pulse: 76 79 76   Resp: (!) 21 (!) 21 (!) 25   Temp:    97.9 F (36.6 C)  TempSrc:    Oral  SpO2: 94% 97% 95%   Weight:      Height:        Constitutional: Lethargic but arousable, oriented x 3.  No evidence of agitation.  Patient is not in any distress.   Skin: Diaphoresis has resolved.  No rashes, no lesions, good skin turgor noted. Eyes: Pupils are equally reactive to light.  No evidence of scleral icterus or conjunctival pallor.  ENMT: Moist mucous membranes noted.  Posterior pharynx clear of any exudate or lesions.   Respiratory: clear to auscultation bilaterally, no wheezing, no crackles. Normal respiratory effort. No accessory muscle use.  Cardiovascular: Tachycardic rate with regular rhythm, no murmurs / rubs / gallops. No extremity edema. 2+ pedal pulses. No carotid bruits.  Abdomen: Abdomen is soft and nontender.  No evidence of intra-abdominal masses.  Positive bowel sounds noted in all quadrants.   Musculoskeletal: No joint deformity upper and lower extremities. Good ROM, no contractures. Normal muscle tone.  Neuro: Tremors have essentially resolved.  Patient following commands, patient moving all 4 extremities spontaneously   Data Reviewed:  I have personally reviewed and interpreted labs, imaging.  Significant findings are   CBC: Recent Labs  Lab 07/22/22 2159 07/23/22 0819 07/24/22 0251 07/24/22 0953 07/25/22 1029  WBC 2.1* 3.0* 1.4* 1.3* 1.7*  HGB 13.9 12.2* 10.5* 10.3* 11.0*  HCT 40.1 36.7* 31.2* 31.0* 32.8*  MCV 75.7* 78.4* 79.4* 78.7* 78.8*  PLT 10* 13* 9* 12* 9*   Basic Metabolic  Panel: Recent Labs  Lab 07/22/22 2159 07/23/22 0819 07/24/22 0251 07/25/22 1029  NA 130* 133* 131* 132*  K 3.6 3.4* 3.4* 3.0*  CL 94* 98 98 100  CO2 21* 21* 24 21*  GLUCOSE 259* 219* 186* 121*  BUN <5* <5* 6 7  CREATININE 0.61 0.54* 0.59* 0.60*  CALCIUM 7.9* 7.6* 7.7* 8.2*  MG  --  1.5* 1.9  --   PHOS  --  3.1 2.6  --    GFR: Estimated Creatinine Clearance: 99.8 mL/min (A) (by C-G formula based on SCr of 0.6 mg/dL (L)). Liver Function Tests: Recent Labs  Lab 07/22/22 2159 07/24/22 0251  AST 135* 73*  ALT 83* 55*  ALKPHOS 91 64  BILITOT 1.4* 1.9*  PROT 6.9 5.6*  ALBUMIN 3.7 3.0*    Coagulation Profile: Recent Labs  Lab 07/24/22 0251  INR 1.3*     Telemetry: Personally reviewed.  Rhythm is sinus tachycardia with heart rate of 110 bpm.  No dynamic ST segment changes appreciated.   Code Status:  Full code.  Code status decision has been confirmed with: patient Family Communication: Wife via phone conversation on 5/3.   Severity of Illness:  The appropriate patient status for this patient is INPATIENT. Inpatient status is judged to be reasonable and necessary in order to provide the required intensity of service to ensure the patient's safety. The patient's presenting symptoms, physical exam findings, and initial radiographic and laboratory data in the context of their chronic comorbidities is felt to place them at high risk for further clinical deterioration. Furthermore, it is not anticipated that the patient will be medically  stable for discharge from the hospital within 2 midnights of admission.   * I certify that at the point of admission it is my clinical judgment that the patient will require inpatient hospital care spanning beyond 2 midnights from the point of admission due to high intensity of service, high risk for further deterioration and high frequency of surveillance required.*  Time spent:  52 minutes  Author:  Marinda Elk MD  07/25/2022 9:02  PM

## 2022-07-25 NOTE — Assessment & Plan Note (Signed)
Patient has been placed on Lexapro 10 mg daily as of 5/3 Patient should additionally follow-up with behavioral health after discharge

## 2022-07-26 DIAGNOSIS — F329 Major depressive disorder, single episode, unspecified: Secondary | ICD-10-CM

## 2022-07-26 DIAGNOSIS — R160 Hepatomegaly, not elsewhere classified: Secondary | ICD-10-CM | POA: Diagnosis not present

## 2022-07-26 DIAGNOSIS — F10930 Alcohol use, unspecified with withdrawal, uncomplicated: Secondary | ICD-10-CM | POA: Diagnosis not present

## 2022-07-26 DIAGNOSIS — D61818 Other pancytopenia: Secondary | ICD-10-CM | POA: Diagnosis not present

## 2022-07-26 DIAGNOSIS — I10 Essential (primary) hypertension: Secondary | ICD-10-CM

## 2022-07-26 DIAGNOSIS — K7030 Alcoholic cirrhosis of liver without ascites: Secondary | ICD-10-CM | POA: Diagnosis not present

## 2022-07-26 LAB — GLUCOSE, CAPILLARY
Glucose-Capillary: 174 mg/dL — ABNORMAL HIGH (ref 70–99)
Glucose-Capillary: 133 mg/dL — ABNORMAL HIGH (ref 70–99)
Glucose-Capillary: 149 mg/dL — ABNORMAL HIGH (ref 70–99)
Glucose-Capillary: 191 mg/dL — ABNORMAL HIGH (ref 70–99)
Glucose-Capillary: 167 mg/dL — ABNORMAL HIGH (ref 70–99)

## 2022-07-26 LAB — PREPARE PLATELET PHERESIS: Unit division: 0

## 2022-07-26 LAB — CBC WITH DIFFERENTIAL/PLATELET
Abs Immature Granulocytes: 0 10*3/uL (ref 0.00–0.07)
Basophils Absolute: 0 10*3/uL (ref 0.0–0.1)
Basophils Relative: 0 %
Eosinophils Absolute: 0 10*3/uL (ref 0.0–0.5)
Eosinophils Relative: 2 %
HCT: 32.2 % — ABNORMAL LOW (ref 39.0–52.0)
Hemoglobin: 10.9 g/dL — ABNORMAL LOW (ref 13.0–17.0)
Immature Granulocytes: 0 %
Lymphocytes Relative: 14 %
Lymphs Abs: 0.3 10*3/uL — ABNORMAL LOW (ref 0.7–4.0)
MCH: 26.9 pg (ref 26.0–34.0)
MCHC: 33.9 g/dL (ref 30.0–36.0)
MCV: 79.5 fL — ABNORMAL LOW (ref 80.0–100.0)
Monocytes Absolute: 0.3 10*3/uL (ref 0.1–1.0)
Monocytes Relative: 13 %
Neutro Abs: 1.4 10*3/uL — ABNORMAL LOW (ref 1.7–7.7)
Neutrophils Relative %: 71 %
Platelets: 10 10*3/uL — CL (ref 150–400)
RBC: 4.05 MIL/uL — ABNORMAL LOW (ref 4.22–5.81)
RDW: 15.6 % — ABNORMAL HIGH (ref 11.5–15.5)
WBC: 2 10*3/uL — ABNORMAL LOW (ref 4.0–10.5)
nRBC: 0 % (ref 0.0–0.2)

## 2022-07-26 LAB — COMPREHENSIVE METABOLIC PANEL
ALT: 47 U/L — ABNORMAL HIGH (ref 0–44)
AST: 53 U/L — ABNORMAL HIGH (ref 15–41)
Albumin: 3.3 g/dL — ABNORMAL LOW (ref 3.5–5.0)
Alkaline Phosphatase: 75 U/L (ref 38–126)
Anion gap: 11 (ref 5–15)
BUN: 7 mg/dL (ref 6–20)
CO2: 20 mmol/L — ABNORMAL LOW (ref 22–32)
Calcium: 8.1 mg/dL — ABNORMAL LOW (ref 8.9–10.3)
Chloride: 99 mmol/L (ref 98–111)
Creatinine, Ser: 0.55 mg/dL — ABNORMAL LOW (ref 0.61–1.24)
GFR, Estimated: >60 mL/min (ref >60)
Glucose, Bld: 133 mg/dL — ABNORMAL HIGH (ref 70–99)
Potassium: 3.5 mmol/L (ref 3.5–5.1)
Sodium: 130 mmol/L — ABNORMAL LOW (ref 135–145)
Total Bilirubin: 1.6 mg/dL — ABNORMAL HIGH (ref 0.3–1.2)
Total Protein: 6 g/dL — ABNORMAL LOW (ref 6.5–8.1)

## 2022-07-26 LAB — MAGNESIUM: Magnesium: 1.7 mg/dL (ref 1.7–2.4)

## 2022-07-26 LAB — BPAM PLATELET PHERESIS
Blood Product Expiration Date: 202405042359
Unit Type and Rh: 5100

## 2022-07-26 LAB — CULTURE, BLOOD (ROUTINE X 2): Culture: NO GROWTH

## 2022-07-26 LAB — PHOSPHORUS: Phosphorus: 2.7 mg/dL (ref 2.5–4.6)

## 2022-07-26 MED ORDER — INSULIN GLARGINE-YFGN 100 UNIT/ML ~~LOC~~ SOLN
10.0000 [IU] | Freq: Every day | SUBCUTANEOUS | Status: DC
  Administered 2022-07-26: 10 [IU] via SUBCUTANEOUS
  Filled 2022-07-26 (×2): qty 0.1

## 2022-07-26 MED ORDER — LISINOPRIL 20 MG PO TABS
20.0000 mg | ORAL_TABLET | Freq: Every day | ORAL | Status: DC
  Administered 2022-07-26 – 2022-07-27 (×2): 20 mg via ORAL
  Filled 2022-07-26: qty 2
  Filled 2022-07-26: qty 1

## 2022-07-26 NOTE — Assessment & Plan Note (Signed)
Lisinopril 20 mg by mouth once daily As needed intravenous hydralazine for markedly elevated blood pressure

## 2022-07-26 NOTE — Progress Notes (Signed)
PROGRESS NOTE   [name]  BMW:[id number] DOB: April 24, 1970 DOA: 07/22/2022 PCP: Eloisa Northern, MD   Date of Service: the patient was seen and examined on 07/26/2022  Brief Narrative:  52 year old male with past medical history of alcohol abuse, alcoholic cirrhosis complicated by portal hypertension and pancytopenia, liver mass (biopsied 12/2021 with necrotic cells noted, this has yet to be repeated), hypertension, diabetes mellitus type 2 presenting to [facility] as a transfer from [facility] emergency department for alcohol withdrawal and thrombocytopenia.  Of note, patient was recently hospitalized from 2/29 until 3/9 at [facility] for alcohol withdrawal and pancytopenia.  During that hospitalization patient was found to be septic with MSSA bacteremia.  Exact source was unclear.   Patient was discharged with a midline to complete 4 weeks of total intravenous antibiotics until 4/3.  Upon evaluation in the emergency department on this presentation patient was found to be actively withdrawing with a blood alcohol level of 467.  Patient was also found to have substantial thrombocytopenia of 10, somewhat worsened from prior presentations.  The hospitalist group was called and patient was transferred to [facility] long hospital for continued medical care.  Patient did receive 1 pack of platelet transfusion the morning of 5/1 and another unit pack of platelets on 5/2 for severe thrombocytopenia.  Patient has been exhibiting ongoing alcohol withdrawal throughout the hospitalization requiring frequent doses of benzodiazepines via CIWA protocol.   Assessment and Plan: * Alcohol withdrawal syndrome without complication (HCC) Continued slow clinical improvement on Librium taper Continuing CIWA protocol  Counseling on cessation daily Monitoring electrolytes closely and replacing as necessary  Pancytopenia, acquired (HCC) Multifactorial secondary to alcoholic cirrhosis  with portal hypertension/splenomegaly and ongoing myelosuppressive effects of alcohol use  Despite 3 separate platelet transfusions patient's platelet count continues to be quite low.   Evaluated by Dr. Mosetta Putt 5/3  and her assistance is greatly appreciated. Continue to monitor for evidence of bleeding Target platelet count of greater than 10,000 Monitoring with serial CBCs  Alcoholic cirrhosis of liver without ascites (HCC) heavy alcohol abuse drinking between 18-24 beers daily Counseling daily on cessation Monitoring transaminases, monitoring INR Child-Pugh class A with 6 points   Liver mass Pt with known liver mass, possible hepatocellular carcinoma biopsy in Oct 2023 with just necrotic tissue Inability to repeat biopsy has been limited due to ongoing thrombocytopenia  Patient will need continued imaging surveillance every 3 months in the absence of the ability to do a biopsy provided question with Dr. Mosetta Putt Obtaining AFP tumor marker per her recommendations  DM (diabetes mellitus), type 2 (HCC) Patient been placed on Accu-Cheks before every meal and nightly with sliding scale insulin Holding home regimen of hypoglycemics Increasing Semglee to 10 units nightly. Hemoglobin A1C 8.9%. Diabetic Diet   MSSA bacteremia Recent MSSA bacteremia during last admit, completed 4 week treatment with ancef. No evidence of active infection  Hypokalemia Improved Evaluating for concurrent hypomagnesemia  Monitoring potassium levels with serial chemistries.   Major depressive disorder Patient has been placed on Lexapro 10 mg daily as of 5/3 Patient should additionally follow-up with behavioral health after discharge  Essential hypertension Lisinopril 20 mg by mouth once daily As needed intravenous hydralazine for markedly elevated blood pressure    Subjective:  Patient states that he is continuing to feel better with improving strength and resolving tremors.  Physical Exam:  Vitals:    07/26/22 1401 07/26/22 1429 07/26/22 1434 07/26/22 1858  BP:  139/75  123/69  Pulse:  75  69  Resp:  20  14  Temp: 97.9 F (36.6 C) 98.3 F (36.8 C)  98.4 F (36.9 C)  TempSrc: Oral Oral  Oral  SpO2:  98%  97%  Weight:   69 kg   Height:   5\' 7"  (1.702 m)     Constitutional: Lethargic but arousable, oriented x 3.  No evidence of agitation.  Patient is not in any distress.   Skin: Diaphoresis has resolved.  No rashes, no lesions, good skin turgor noted. Eyes: Pupils are equally reactive to light.  No evidence of scleral icterus or conjunctival pallor.  ENMT: Moist mucous membranes noted.  Posterior pharynx clear of any exudate or lesions.   Respiratory: clear to auscultation bilaterally, no wheezing, no crackles. Normal respiratory effort. No accessory muscle use.  Cardiovascular: Regular rate and rhythm, no murmurs / rubs / gallops. No extremity edema. 2+ pedal pulses. No carotid bruits.  Abdomen: Abdomen is soft and nontender.  No evidence of intra-abdominal masses.  Positive bowel sounds noted in all quadrants.   Musculoskeletal: No joint deformity upper and lower extremities. Good ROM, no contractures. Normal muscle tone.  Neuro: Tremors have essentially resolved.  Patient following commands, patient moving all 4 extremities spontaneously   Data Reviewed:  I have personally reviewed and interpreted labs, imaging.  Significant findings are   CBC: Recent Labs  Lab 07/23/22 0819 07/24/22 0251 07/24/22 0953 07/25/22 1029 07/26/22 0303  WBC 3.0* 1.4* 1.3* 1.7* 2.0*  NEUTROABS  --   --   --   --  1.4*  HGB 12.2* 10.5* 10.3* 11.0* 10.9*  HCT 36.7* 31.2* 31.0* 32.8* 32.2*  MCV 78.4* 79.4* 78.7* 78.8* 79.5*  PLT 13* 9* 12* 9* 10*   Basic Metabolic Panel: Recent Labs  Lab 07/22/22 2159 07/23/22 0819 07/24/22 0251 07/25/22 1029 07/26/22 0303  NA 130* 133* 131* 132* 130*  K 3.6 3.4* 3.4* 3.0* 3.5  CL 94* 98 98 100 99  CO2 21* 21* 24 21* 20*  GLUCOSE 259* 219* 186*  121* 133*  BUN <5* <5* 6 7 7   CREATININE 0.61 0.54* 0.59* 0.60* 0.55*  CALCIUM 7.9* 7.6* 7.7* 8.2* 8.1*  MG  --  1.5* 1.9  --  1.7  PHOS  --  3.1 2.6  --  2.7   GFR: Estimated Creatinine Clearance: 99.8 mL/min (A) (by C-G formula based on SCr of 0.55 mg/dL (L)). Liver Function Tests: Recent Labs  Lab 07/22/22 2159 07/24/22 0251 07/26/22 0303  AST 135* 73* 53*  ALT 83* 55* 47*  ALKPHOS 91 64 75  BILITOT 1.4* 1.9* 1.6*  PROT 6.9 5.6* 6.0*  ALBUMIN 3.7 3.0* 3.3*    Coagulation Profile: Recent Labs  Lab 07/24/22 0251  INR 1.3*     Telemetry: Personally reviewed.  Rhythm is sinus tachycardia with heart rate of 110 bpm.  No dynamic ST segment changes appreciated.   Code Status:  Full code.  Code status decision has been confirmed with: patient Family Communication: Wife via phone conversation on 5/3.   Severity of Illness:  The appropriate patient status for this patient is INPATIENT. Inpatient status is judged to be reasonable and necessary in order to provide the required intensity of service to ensure the patient's safety. The patient's presenting symptoms, physical exam findings, and initial radiographic and laboratory data in the context of their chronic comorbidities is felt to place them at high risk for further clinical deterioration. Furthermore, it is not anticipated that the patient will be  medically stable for discharge from the hospital within 2 midnights of admission.   * I certify that at the point of admission it is my clinical judgment that the patient will require inpatient hospital care spanning beyond 2 midnights from the point of admission due to high intensity of service, high risk for further deterioration and high frequency of surveillance required.*  Time spent:  36 minutes  Author:  Marinda Elk MD  07/26/2022 8:52 PM

## 2022-07-26 NOTE — Plan of Care (Signed)

## 2022-07-27 DIAGNOSIS — D61818 Other pancytopenia: Secondary | ICD-10-CM | POA: Diagnosis not present

## 2022-07-27 DIAGNOSIS — K7030 Alcoholic cirrhosis of liver without ascites: Secondary | ICD-10-CM | POA: Diagnosis not present

## 2022-07-27 DIAGNOSIS — F10930 Alcohol use, unspecified with withdrawal, uncomplicated: Secondary | ICD-10-CM | POA: Diagnosis not present

## 2022-07-27 DIAGNOSIS — R160 Hepatomegaly, not elsewhere classified: Secondary | ICD-10-CM | POA: Diagnosis not present

## 2022-07-27 LAB — COMPREHENSIVE METABOLIC PANEL
ALT: 46 U/L — ABNORMAL HIGH (ref 0–44)
AST: 49 U/L — ABNORMAL HIGH (ref 15–41)
Albumin: 3.2 g/dL — ABNORMAL LOW (ref 3.5–5.0)
Alkaline Phosphatase: 72 U/L (ref 38–126)
Anion gap: 11 (ref 5–15)
BUN: 6 mg/dL (ref 6–20)
CO2: 21 mmol/L — ABNORMAL LOW (ref 22–32)
Calcium: 8.1 mg/dL — ABNORMAL LOW (ref 8.9–10.3)
Chloride: 103 mmol/L (ref 98–111)
Creatinine, Ser: 0.63 mg/dL (ref 0.61–1.24)
GFR, Estimated: >60 mL/min (ref >60)
Glucose, Bld: 139 mg/dL — ABNORMAL HIGH (ref 70–99)
Potassium: 3.3 mmol/L — ABNORMAL LOW (ref 3.5–5.1)
Sodium: 135 mmol/L (ref 135–145)
Total Bilirubin: 1.7 mg/dL — ABNORMAL HIGH (ref 0.3–1.2)
Total Protein: 5.8 g/dL — ABNORMAL LOW (ref 6.5–8.1)

## 2022-07-27 LAB — CBC WITH DIFFERENTIAL/PLATELET
Abs Immature Granulocytes: 0.01 10*3/uL (ref 0.00–0.07)
Basophils Absolute: 0 10*3/uL (ref 0.0–0.1)
Basophils Relative: 0 %
Eosinophils Absolute: 0 10*3/uL (ref 0.0–0.5)
Eosinophils Relative: 2 %
HCT: 32.7 % — ABNORMAL LOW (ref 39.0–52.0)
Hemoglobin: 10.8 g/dL — ABNORMAL LOW (ref 13.0–17.0)
Immature Granulocytes: 1 %
Lymphocytes Relative: 17 %
Lymphs Abs: 0.3 10*3/uL — ABNORMAL LOW (ref 0.7–4.0)
MCH: 26.5 pg (ref 26.0–34.0)
MCHC: 33 g/dL (ref 30.0–36.0)
MCV: 80.1 fL (ref 80.0–100.0)
Monocytes Absolute: 0.2 10*3/uL (ref 0.1–1.0)
Monocytes Relative: 13 %
Neutro Abs: 1.1 10*3/uL — ABNORMAL LOW (ref 1.7–7.7)
Neutrophils Relative %: 67 %
Platelets: 11 10*3/uL — CL (ref 150–400)
RBC: 4.08 MIL/uL — ABNORMAL LOW (ref 4.22–5.81)
RDW: 16.1 % — ABNORMAL HIGH (ref 11.5–15.5)
WBC: 1.6 10*3/uL — ABNORMAL LOW (ref 4.0–10.5)
nRBC: 0 % (ref 0.0–0.2)

## 2022-07-27 LAB — MAGNESIUM: Magnesium: 1.7 mg/dL (ref 1.7–2.4)

## 2022-07-27 LAB — GLUCOSE, CAPILLARY
Glucose-Capillary: 142 mg/dL — ABNORMAL HIGH (ref 70–99)
Glucose-Capillary: 156 mg/dL — ABNORMAL HIGH (ref 70–99)

## 2022-07-27 LAB — PHOSPHORUS: Phosphorus: 3.8 mg/dL (ref 2.5–4.6)

## 2022-07-27 LAB — CULTURE, BLOOD (ROUTINE X 2)

## 2022-07-27 MED ORDER — LORAZEPAM 1 MG PO TABS
1.0000 mg | ORAL_TABLET | Freq: Once | ORAL | Status: AC
  Administered 2022-07-27: 1 mg via ORAL
  Filled 2022-07-27: qty 1

## 2022-07-27 MED ORDER — ESCITALOPRAM OXALATE 10 MG PO TABS: 10.0000 mg | ORAL_TABLET | Freq: Every day | ORAL | 2 refills | Status: DC

## 2022-07-27 MED ORDER — MAGNESIUM SULFATE 2 GM/50ML IV SOLN
2.0000 g | Freq: Once | INTRAVENOUS | Status: AC
  Administered 2022-07-27: 2 g via INTRAVENOUS
  Filled 2022-07-27: qty 50

## 2022-07-27 MED ORDER — LISINOPRIL 20 MG PO TABS: 20.0000 mg | ORAL_TABLET | Freq: Every day | ORAL | 2 refills | Status: DC

## 2022-07-27 MED ORDER — POTASSIUM CHLORIDE CRYS ER 20 MEQ PO TBCR
40.0000 meq | EXTENDED_RELEASE_TABLET | Freq: Once | ORAL | Status: AC
  Administered 2022-07-27: 40 meq via ORAL
  Filled 2022-07-27: qty 2

## 2022-07-27 MED ORDER — INSULIN GLARGINE 100 UNIT/ML SOLOSTAR PEN: 10.0000 [IU] | PEN_INJECTOR | Freq: Every day | SUBCUTANEOUS | 2 refills | Status: DC

## 2022-07-27 MED ORDER — INSULIN GLARGINE 100 UNIT/ML SOLOSTAR PEN
10.0000 [IU] | PEN_INJECTOR | Freq: Every day | SUBCUTANEOUS | 2 refills | Status: DC
Start: 2022-07-27 — End: 2022-07-27

## 2022-07-27 MED ORDER — PANTOPRAZOLE SODIUM 40 MG PO TBEC: 40.0000 mg | DELAYED_RELEASE_TABLET | Freq: Every day | ORAL | 2 refills | Status: AC

## 2022-07-27 NOTE — Discharge Instructions (Addendum)
Please take all prescribed medications exactly as instructed.  This includes your new prescription for Lexapro 10 mg by mouth once daily for your depression and the addition of lisinopril 20 mg by mouth once daily for your high blood pressure. We have also adjusted your outpatient regimen of Lantus and increased it to 10 units daily. Please avoid taking any amount of alcohol whatsoever Please consume a low-sodium low carbohydrate diet Please follow-up with Dr. Mosetta Putt with oncology in approximately 1 week.  They should be contacting you with an appointment in the next 48 hours. Please follow-up with your primary care provider in 1 to 2 weeks Please follow-up with a mental health professional at [facility] Please return to the emergency department if you develop nausea vomiting confusion fever or inability to tolerate oral intake.

## 2022-07-27 NOTE — Discharge Summary (Signed)
Physician Discharge Summary   Patient: [name] MRN: [id number] DOB: Aug 10, 1970  Admit date:     07/22/2022  Discharge date: 07/27/22  Discharge Physician: Marinda Elk   PCP: Eloisa Northern, MD   Recommendations at discharge:   Please take all prescribed medications exactly as instructed.  This includes your new prescription for Lexapro 10 mg by mouth once daily for your depression and the addition of lisinopril 20 mg by mouth once daily for your high blood pressure. We have also adjusted your outpatient regimen of Lantus and increased it to 10 units daily. Please avoid taking any amount of alcohol whatsoever Please consume a low-sodium low carbohydrate diet Please follow-up with Dr. Mosetta Putt with oncology in approximately 1 week.  They should be contacting you with an appointment in the next 48 hours. Please follow-up with your primary care provider in 1 to 2 weeks Please follow-up with a mental health professional at [facility] recovery services Please return to the emergency department if you develop nausea vomiting confusion fever or inability to tolerate oral intake.  Discharge Diagnoses: Principal Problem:   Alcohol withdrawal syndrome without complication (HCC) Active Problems:   Pancytopenia, acquired (HCC)   Alcoholic cirrhosis of liver without ascites (HCC)   Liver mass   DM (diabetes mellitus), type 2 (HCC)   MSSA bacteremia   Hypokalemia   Major depressive disorder   Essential hypertension  Resolved Problems:   * No resolved hospital problems. *   Hospital Course: 52 year old male with past medical history of alcohol abuse, alcoholic cirrhosis complicated by portal hypertension and pancytopenia, liver mass (biopsied 12/2021 with necrotic cells noted, this has yet to be repeated), hypertension, diabetes mellitus type 2 presenting to [facility] as a transfer from [facility] emergency department for alcohol withdrawal and  thrombocytopenia.  Of note, patient was recently hospitalized from 2/29 until 3/9 at [facility] for alcohol withdrawal and pancytopenia.  During that hospitalization patient was found to be septic with MSSA bacteremia.  Exact source was unclear.   Patient was discharged with a midline to complete 4 weeks of total intravenous antibiotics until 4/3.  Upon evaluation in the emergency department on this presentation patient was found to be actively withdrawing with a blood alcohol level of 467.  Patient was also found to have substantial thrombocytopenia of 10, somewhat worsened from prior presentations.  The hospitalist group was called and patient was transferred to [facility] long hospital for continued medical care.  Patient has been exhibiting ongoing alcohol withdrawal throughout the hospitalization requiring frequent doses of benzodiazepines via CIWA protocol.  In the days that followed signs of withdrawal slowly improved and patient was able to be weaned off of a tapering regimen of benzodiazepines.  Patient did receive 1 pack of platelet transfusion the morning of 5/1, another unit pack of platelets on 5/2 and 1/3 pack of platelet transfused on 5/3 for persisting severe thrombocytopenia.  Due to this persistent thrombocytopenia Dr. Mosetta Putt with hematology was consulted who agreed with current management but it was likely secondary to splenomegaly and effects of alcohol and recommended ongoing continued alcohol cessation and conservative management.  She recommend that he follow-up with her in the outpatient setting in approxi-1 week after discharge.  Patient was noted to be depressed and have untreated depression during this hospitalization.  Patient was placed on Lexapro 10 mg by mouth once daily and was advised to follow-up as an outpatient with [facility] for additional support from a behavioral health  provider.  Patient was discharged home in improved and stable condition on  07/27/2022.       Consultants: Dr. Mosetta Putt with Hematology/Oncology Procedures performed: None Disposition: Home Diet recommendation:  Cardiac and Carb modified diet  DISCHARGE MEDICATION: Allergies as of 07/27/2022   No Known Allergies      Medication List     STOP taking these medications    Constulose 10 GM/15ML solution Generic drug: lactulose   furosemide 20 MG tablet Commonly known as: LASIX   metFORMIN 500 MG 24 hr tablet Commonly known as: GLUCOPHAGE-XR   spironolactone 25 MG tablet Commonly known as: ALDACTONE   traZODone 150 MG tablet Commonly known as: DESYREL       TAKE these medications    escitalopram 10 MG tablet Commonly known as: LEXAPRO Take 1 tablet (10 mg total) by mouth daily. Start taking on: Jul 28, 2022   folic acid 1 MG tablet Commonly known as: FOLVITE TAKE 1 TABLET BY MOUTH EVERY DAY   insulin glargine 100 UNIT/ML Solostar Pen Commonly known as: LANTUS Inject 10 Units into the skin daily. What changed: how much to take   lisinopril 20 MG tablet Commonly known as: ZESTRIL Take 1 tablet (20 mg total) by mouth daily. Start taking on: Jul 28, 2022   mirtazapine 15 MG tablet Commonly known as: Remeron Take 1 tablet (15 mg total) by mouth at bedtime. What changed:  when to take this reasons to take this   pantoprazole 40 MG tablet Commonly known as: PROTONIX Take 1 tablet (40 mg total) by mouth daily. What changed: when to take this   tamsulosin 0.4 MG Caps capsule Commonly known as: FLOMAX Take 1 capsule (0.4 mg total) by mouth daily after supper. What changed: when to take this   thiamine 100 MG tablet Commonly known as: VITAMIN B1 Take 1 tablet (100 mg total) by mouth daily.        Follow-up Information     Eloisa Northern, MD Follow up in 1 week(s).   Specialty: Internal Medicine Contact information: [address] [place] [address] [phone]         Services, [facility]. Schedule an  appointment as soon as possible for a visit.   Contact information: [address] [place] [address] [phone]         Malachy Mood, MD. Schedule an appointment as soon as possible for a visit in 1 week(s).   Specialties: Hematology, Oncology Contact information: [address] [place] [address] [phone]                 Discharge Exam: [facility]   07/22/22 2053 07/26/22 1434  Weight: 72.6 kg 69 kg    Constitutional: Awake alert and oriented x3, no associated distress.   Respiratory: clear to auscultation bilaterally, no wheezing, no crackles. Normal respiratory effort. No accessory muscle use.  Cardiovascular: Regular rate and rhythm, no murmurs / rubs / gallops. No extremity edema. 2+ pedal pulses. No carotid bruits.  Abdomen: Abdomen is soft and nontender.  No evidence of intra-abdominal masses.  Positive bowel sounds noted in all quadrants.   Musculoskeletal: No joint deformity upper and lower extremities. Good ROM, no contractures. Normal muscle tone.     Condition at discharge: fair  The results of significant diagnostics from this hospitalization (including imaging, microbiology, ancillary and laboratory) are listed below for reference.   Imaging Studies: No results found.  Microbiology: Results for orders placed or performed during  the hospital encounter of 07/22/22  Culture, blood (Routine X 2) w Reflex to ID Panel     Status: None (Preliminary result)   Collection Time: 07/23/22  6:30 AM   Specimen: BLOOD  Result Value Ref Range Status   Specimen Description   Final    BLOOD BLOOD LEFT ARM Performed at [facility], [address], [place], [place] [address]    Special Requests   Final    BOTTLES DRAWN AEROBIC AND ANAEROBIC Blood Culture adequate volume Performed at [facility], [address], [place], [place] [address]    Culture   Final    NO GROWTH 4 DAYS Performed at [facility], [address] [place], [place] [address]    Report Status PENDING  Incomplete  Culture, blood (Routine X 2) w Reflex to ID Panel     Status: None (Preliminary result)   Collection Time: 07/23/22  6:44 AM   Specimen: BLOOD  Result Value Ref Range Status   Specimen Description   Final    BLOOD BLOOD LEFT HAND Performed at [facility], [address], [place], [place] [address]    Special Requests   Final    BOTTLES DRAWN AEROBIC AND ANAEROBIC Blood Culture adequate volume Performed at [facility], [address] [place], [place] [address]    Culture   Final    NO GROWTH 4 DAYS Performed at [facility], [address], [place], [place] [address]    Report Status PENDING  Incomplete    Labs: CBC: Recent Labs  Lab 07/24/22 0251 07/24/22 0953 07/25/22 1029 07/26/22 0303 07/27/22 0448  WBC 1.4* 1.3* 1.7* 2.0* 1.6*  NEUTROABS  --   --   --  1.4* 1.1*  HGB 10.5* 10.3* 11.0* 10.9* 10.8*  HCT 31.2* 31.0* 32.8* 32.2* 32.7*  MCV 79.4* 78.7* 78.8* 79.5* 80.1  PLT 9* 12* 9* 10* 11*   Basic Metabolic Panel: Recent Labs  Lab 07/23/22 0819 07/24/22 0251 07/25/22 1029 07/26/22 0303 07/27/22 0448  NA 133* 131* 132* 130* 135  K 3.4* 3.4* 3.0* 3.5 3.3*  CL 98 98 100 99 103  CO2 21* 24 21* 20* 21*  GLUCOSE 219* 186* 121* 133* 139*  BUN <5* 6 7 7 6   CREATININE 0.54* 0.59* 0.60* 0.55* 0.63  CALCIUM 7.6* 7.7* 8.2* 8.1* 8.1*  MG 1.5* 1.9  --  1.7 1.7  PHOS 3.1 2.6  --  2.7 3.8   Liver Function Tests: Recent Labs  Lab 07/22/22 2159 07/24/22 0251 07/26/22 0303 07/27/22 0448  AST 135* 73* 53* 49*  ALT 83* 55* 47* 46*  ALKPHOS 91 64 75 72  BILITOT 1.4* 1.9* 1.6* 1.7*  PROT 6.9 5.6* 6.0* 5.8*  ALBUMIN 3.7 3.0* 3.3* 3.2*   CBG: Recent Labs  Lab 07/26/22 1650 07/26/22 1954 07/26/22 2149 07/27/22 0723 07/27/22 1116  GLUCAP 149* 191* 167* 142* 156*    Discharge time spent: greater than 30  minutes.  Signed: Marinda Elk, MD [facility] 07/27/2022

## 2022-07-28 ENCOUNTER — Telehealth: Payer: Self-pay | Admitting: Hematology

## 2022-07-28 LAB — CULTURE, BLOOD (ROUTINE X 2)

## 2022-07-28 NOTE — Telephone Encounter (Signed)
Contacted patient to scheduled appointments. Left message with appointment details and a call back number if patient had any questions or could not accommodate the time we provided.   

## 2022-07-29 LAB — AFP TUMOR MARKER: AFP, Serum, Tumor Marker: 3.3 ng/mL (ref 0.0–8.4)

## 2022-07-30 NOTE — Progress Notes (Unsigned)
[facility]   [phone] [phone]   Clinic Follow up Note   Patient Care Team: Eloisa Northern, MD as PCP - General (Internal Medicine) Malachy Mood, MD as Attending Physician (Hematology and Oncology)  Date of Service:  07/31/2022  CHIEF COMPLAINT: f/u of liver mass   CURRENT THERAPY:  Observation   ASSESSMENT:  [name] is a 52 y.o. male with   1. Liver Mass -presented with coffee-ground emesis, admitted 01/20/22. CT AP showed liver cirrhosis, splenomegaly, and a 4.6 cm liver mass. Baseline AFP was WNL. -MRI showed 4.6 cm hemorrhagic mass in segment 8, difficult to characterize by LI-RADS due to lack non rim enhancement, but nonetheless concerning for [facility] until proven otherwise -liver biopsy on 12/27/21 showed predominantly necrotic tissue, no evidence of malignancy. -CT scan from May 22, 2022 showed stable liver mass -I reviewed with patient and his mother that although the biopsy was negative, malignancy is not completely ruled out.  Recommend repeating MRI, and to decide if we need to repeated a liver biopsy.   2. Liver cirrhosis, Child-Pugh score 6, class A -he has a history of heavy alcohol consumption -cirrhosis and small liver lesions were seen on CT AP back in 07/2012 (no records prior to this). -I again encouraged him to stop drinking alcohol completely.  After his multiple hospital stay, he has decided to quit alcohol completely.  His family is helping him also.   3. Pancytopenia, chronic -secondary to splenomegaly and alcohol abuse -Worsened anemia due to his recent GI bleeding.  He required blood transfusion -His platelet was less than 10 in the hospital, required a blood transfusion -Platelets 31 today, is recovering since that he has stopped alcohol. -We discussed the role of TPO to improve his platelet count if he has procedure or surgery.  No needed treatment for now, we will continue monitoring.      PLAN: -lab -  Platelet count - 31K, improved  -recommend to repeat Liver MRI, ordered, to be done in next few week, will review his MRI in tumor board, to determine if liver  -lab and f/u in 3 months   INTERVAL HISTORY:  [name] is here for a follow up of liver mass He was last seen by me on 10/11/20233. He presents to the clinic accompanied by mother. Pt state that since he has been discharge from the hospital he feels a lot better.    All other systems were reviewed with the patient and are negative.  MEDICAL HISTORY:  Past Medical History:  Diagnosis Date   Alcoholic (HCC)    Anxiety    Diabetes (HCC)    DVT (deep venous thrombosis) (HCC)    Hypertension     SURGICAL HISTORY: Past Surgical History:  Procedure Laterality Date   BLOOD PATCH     ESOPHAGOGASTRODUODENOSCOPY N/A 12/26/2021   Procedure: ESOPHAGOGASTRODUODENOSCOPY (EGD);  Surgeon: Kerin Salen, MD;  Location: [facility];  Service: Gastroenterology;  Laterality: N/A;   TONSILLECTOMY      I have reviewed the social history and family history with the patient and they are unchanged from previous note.  ALLERGIES:  has No Known Allergies.  MEDICATIONS:  Current Outpatient Medications  Medication Sig Dispense Refill   escitalopram (LEXAPRO) 10 MG tablet Take 1 tablet (10 mg total) by mouth daily. 30 tablet 2   folic acid (FOLVITE) 1 MG tablet TAKE 1 TABLET BY MOUTH EVERY DAY 30 tablet 0   insulin glargine (LANTUS) 100 UNIT/ML Solostar  Pen Inject 10 Units into the skin daily. 15 mL 2   lisinopril (ZESTRIL) 20 MG tablet Take 1 tablet (20 mg total) by mouth daily. 30 tablet 2   mirtazapine (REMERON) 15 MG tablet Take 1 tablet (15 mg total) by mouth at bedtime. (Patient taking differently: Take 15 mg by mouth at bedtime as needed (sleep).) 30 tablet 2   pantoprazole (PROTONIX) 40 MG tablet Take 1 tablet (40 mg total) by mouth daily. 30 tablet 2   tamsulosin (FLOMAX) 0.4 MG CAPS capsule Take 1 capsule (0.4 mg total) by  mouth daily after supper. (Patient taking differently: Take 0.4 mg by mouth at bedtime.) 30 capsule 4   thiamine (VITAMIN B1) 100 MG tablet Take 1 tablet (100 mg total) by mouth daily. 30 tablet 3   No current facility-administered medications for this visit.    PHYSICAL EXAMINATION: ECOG PERFORMANCE STATUS: 1 - Symptomatic but completely ambulatory  Vitals:   07/31/22 0940  BP: 135/71  Pulse: 67  Resp: 18  Temp: 98.2 F (36.8 C)  SpO2: 98%   Wt Readings from Last 3 Encounters:  07/31/22 155 lb 12.8 oz (70.7 kg)  07/26/22 152 lb 1.9 oz (69 kg)  06/24/22 155 lb 3.2 oz (70.4 kg)     GENERAL:alert, no distress and comfortable SKIN: skin color normal, no rashes or significant lesions EYES: normal, Conjunctiva are pink and non-injected, sclera clear  NEURO: alert & oriented x 3 with fluent speech  LABORATORY DATA:  I have reviewed the data as listed    Latest Ref Rng & Units 07/31/2022    8:50 AM 07/27/2022    4:48 AM 07/26/2022    3:03 AM  CBC  WBC 4.0 - 10.5 K/uL 2.0  1.6  2.0   Hemoglobin 13.0 - 17.0 g/dL 16.1  09.6  04.5   Hematocrit 39.0 - 52.0 % 39.4  32.7  32.2   Platelets 150 - 400 K/uL 31  11  10          Latest Ref Rng & Units 07/27/2022    4:48 AM 07/26/2022    3:03 AM 07/25/2022   10:29 AM  CMP  Glucose 70 - 99 mg/dL 409  811  914   BUN 6 - 20 mg/dL 6  7  7    Creatinine 0.61 - 1.24 mg/dL 7.82  9.56  2.13   Sodium 135 - 145 mmol/L 135  130  132   Potassium 3.5 - 5.1 mmol/L 3.3  3.5  3.0   Chloride 98 - 111 mmol/L 103  99  100   CO2 22 - 32 mmol/L 21  20  21    Calcium 8.9 - 10.3 mg/dL 8.1  8.1  8.2   Total Protein 6.5 - 8.1 g/dL 5.8  6.0    Total Bilirubin 0.3 - 1.2 mg/dL 1.7  1.6    Alkaline Phos 38 - 126 U/L 72  75    AST 15 - 41 U/L 49  53    ALT 0 - 44 U/L 46  47        RADIOGRAPHIC STUDIES: I have personally reviewed the radiological images as listed and agreed with the findings in the report. No results found.    Orders Placed This Encounter   Procedures   MR LIVER W WO CONTRAST    Liver protocol    Standing Status:   Future    Standing Expiration Date:   07/31/2023    Order Specific Question:   If indicated for  the ordered procedure, I authorize the administration of contrast media per Radiology protocol    Answer:   Yes    Order Specific Question:   What is the patient's sedation requirement?    Answer:   No Sedation    Order Specific Question:   Does the patient have a pacemaker or implanted devices?    Answer:   No    Order Specific Question:   Release to patient    Answer:   Immediate    Order Specific Question:   Preferred imaging location?    Answer:   [facility] (table limit - 550 lbs)   All questions were answered. The patient knows to call the clinic with any problems, questions or concerns. No barriers to learning was detected. The total time spent in the appointment was 25 minutes.     Malachy Mood, MD 07/31/2022   Carolin Coy, CMA, am acting as scribe for Malachy Mood, MD.   I have reviewed the above documentation for accuracy and completeness, and I agree with the above.

## 2022-07-31 ENCOUNTER — Inpatient Hospital Stay: Payer: 59 | Admitting: Hematology

## 2022-07-31 ENCOUNTER — Encounter: Payer: Self-pay | Admitting: Hematology

## 2022-07-31 ENCOUNTER — Inpatient Hospital Stay: Payer: 59 | Attending: Hematology

## 2022-07-31 ENCOUNTER — Other Ambulatory Visit: Payer: Self-pay

## 2022-07-31 ENCOUNTER — Other Ambulatory Visit: Payer: Self-pay | Admitting: Internal Medicine

## 2022-07-31 VITALS — BP 135/71 | HR 67 | Temp 98.2°F | Resp 18 | Ht 67.0 in | Wt 155.8 lb

## 2022-07-31 DIAGNOSIS — R162 Hepatomegaly with splenomegaly, not elsewhere classified: Secondary | ICD-10-CM | POA: Insufficient documentation

## 2022-07-31 DIAGNOSIS — F1010 Alcohol abuse, uncomplicated: Secondary | ICD-10-CM | POA: Insufficient documentation

## 2022-07-31 DIAGNOSIS — R160 Hepatomegaly, not elsewhere classified: Secondary | ICD-10-CM

## 2022-07-31 DIAGNOSIS — D61818 Other pancytopenia: Secondary | ICD-10-CM | POA: Diagnosis not present

## 2022-07-31 DIAGNOSIS — K7030 Alcoholic cirrhosis of liver without ascites: Secondary | ICD-10-CM

## 2022-07-31 DIAGNOSIS — K7460 Unspecified cirrhosis of liver: Secondary | ICD-10-CM | POA: Diagnosis not present

## 2022-07-31 DIAGNOSIS — D649 Anemia, unspecified: Secondary | ICD-10-CM

## 2022-07-31 LAB — CBC WITH DIFFERENTIAL/PLATELET
Abs Immature Granulocytes: 0 10*3/uL (ref 0.00–0.07)
Basophils Absolute: 0 10*3/uL (ref 0.0–0.1)
Basophils Relative: 1 %
Eosinophils Absolute: 0 10*3/uL (ref 0.0–0.5)
Eosinophils Relative: 2 %
HCT: 39.4 % (ref 39.0–52.0)
Hemoglobin: 12.7 g/dL — ABNORMAL LOW (ref 13.0–17.0)
Immature Granulocytes: 0 %
Lymphocytes Relative: 34 %
Lymphs Abs: 0.7 10*3/uL (ref 0.7–4.0)
MCH: 26.3 pg (ref 26.0–34.0)
MCHC: 32.2 g/dL (ref 30.0–36.0)
MCV: 81.6 fL (ref 80.0–100.0)
Monocytes Absolute: 0.5 10*3/uL (ref 0.1–1.0)
Monocytes Relative: 24 %
Neutro Abs: 0.8 10*3/uL — ABNORMAL LOW (ref 1.7–7.7)
Neutrophils Relative %: 39 %
Platelets: 31 10*3/uL — ABNORMAL LOW (ref 150–400)
RBC: 4.83 MIL/uL (ref 4.22–5.81)
RDW: 16.3 % — ABNORMAL HIGH (ref 11.5–15.5)
Smear Review: NORMAL
WBC: 2 10*3/uL — ABNORMAL LOW (ref 4.0–10.5)
nRBC: 0 % (ref 0.0–0.2)

## 2022-07-31 LAB — FERRITIN: Ferritin: 15 ng/mL — ABNORMAL LOW (ref 24–336)

## 2022-08-02 LAB — AFP TUMOR MARKER: AFP, Serum, Tumor Marker: 2.7 ng/mL (ref 0.0–8.4)

## 2022-08-05 ENCOUNTER — Inpatient Hospital Stay: Payer: 59 | Admitting: Internal Medicine

## 2022-08-06 ENCOUNTER — Other Ambulatory Visit: Payer: Self-pay | Admitting: Internal Medicine

## 2022-08-06 MED ORDER — FOLIC ACID 1 MG PO TABS: 1.0000 mg | ORAL_TABLET | Freq: Every day | ORAL | 0 refills | Status: DC

## 2022-08-06 MED ORDER — THIAMINE HCL 100 MG PO TABS: 100.0000 mg | ORAL_TABLET | Freq: Every day | ORAL | 3 refills | Status: DC

## 2022-08-13 ENCOUNTER — Telehealth: Payer: Self-pay

## 2022-08-13 ENCOUNTER — Other Ambulatory Visit: Payer: Self-pay

## 2022-08-13 NOTE — Telephone Encounter (Signed)
Patients mother called in stating that her son was having a colonoscopy on Friday and she said that Dr. Mosetta Putt stated in his last appointment that if he had any procedure that he would need a medicine because of his low platelet count. I looked at Dr. Georga Bora notes and spoke with Santiago Glad NP about the message she stated he was having an MRI of his Liver Friday not a colonoscopy. Called patients wife and made her aware of the MRI and that its not a colonoscopy. I asked her if he needed anything for the claustrophobia that the patients mom stated he has and she said yes. Lacie called in 1 Ativan for the patient at his pharmacy.

## 2022-08-14 ENCOUNTER — Other Ambulatory Visit: Payer: Self-pay | Admitting: Hematology

## 2022-08-14 ENCOUNTER — Other Ambulatory Visit: Payer: Self-pay

## 2022-08-14 ENCOUNTER — Other Ambulatory Visit: Payer: Self-pay | Admitting: Nurse Practitioner

## 2022-08-14 MED ORDER — LORAZEPAM 0.5 MG PO TABS: 0.5000 mg | ORAL_TABLET | Freq: Once | ORAL | 0 refills | Status: AC

## 2022-08-14 MED ORDER — LORAZEPAM 0.5 MG PO TABS: 0.5000 mg | ORAL_TABLET | Freq: Once | ORAL | 0 refills | Status: DC

## 2022-08-15 ENCOUNTER — Ambulatory Visit (HOSPITAL_COMMUNITY)
Admission: RE | Admit: 2022-08-15 | Discharge: 2022-08-15 | Disposition: A | Discharge status: Home / Self Care | Payer: 59 | Source: Ambulatory Visit | Attending: Hematology | Admitting: Hematology

## 2022-08-15 DIAGNOSIS — R160 Hepatomegaly, not elsewhere classified: Secondary | ICD-10-CM | POA: Insufficient documentation

## 2022-08-15 DIAGNOSIS — K7460 Unspecified cirrhosis of liver: Secondary | ICD-10-CM | POA: Diagnosis not present

## 2022-08-15 MED ORDER — GADOBUTROL 1 MMOL/ML IV SOLN
7.0000 mL | Freq: Once | INTRAVENOUS | Status: AC | PRN
  Administered 2022-08-15: 7 mL via INTRAVENOUS

## 2022-08-19 ENCOUNTER — Telehealth: Payer: Self-pay | Admitting: Hematology

## 2022-08-19 NOTE — Telephone Encounter (Signed)
Contacted patient to scheduled appointments. Left message with appointment details and a call back number if patient had any questions or could not accommodate the time we provided.   

## 2022-08-20 DIAGNOSIS — Z8249 Family history of ischemic heart disease and other diseases of the circulatory system: Secondary | ICD-10-CM | POA: Diagnosis not present

## 2022-08-20 DIAGNOSIS — E109 Type 1 diabetes mellitus without complications: Secondary | ICD-10-CM | POA: Diagnosis not present

## 2022-08-20 DIAGNOSIS — K766 Portal hypertension: Secondary | ICD-10-CM | POA: Diagnosis not present

## 2022-08-20 DIAGNOSIS — Z811 Family history of alcohol abuse and dependence: Secondary | ICD-10-CM | POA: Diagnosis not present

## 2022-08-20 DIAGNOSIS — F320 Major depressive disorder, single episode, mild: Secondary | ICD-10-CM | POA: Diagnosis not present

## 2022-08-20 DIAGNOSIS — K7460 Unspecified cirrhosis of liver: Secondary | ICD-10-CM | POA: Diagnosis not present

## 2022-08-20 DIAGNOSIS — Z833 Family history of diabetes mellitus: Secondary | ICD-10-CM | POA: Diagnosis not present

## 2022-08-20 DIAGNOSIS — F1020 Alcohol dependence, uncomplicated: Secondary | ICD-10-CM | POA: Diagnosis not present

## 2022-08-20 DIAGNOSIS — Z823 Family history of stroke: Secondary | ICD-10-CM | POA: Diagnosis not present

## 2022-08-20 DIAGNOSIS — K7030 Alcoholic cirrhosis of liver without ascites: Secondary | ICD-10-CM | POA: Diagnosis not present

## 2022-08-20 DIAGNOSIS — I10 Essential (primary) hypertension: Secondary | ICD-10-CM | POA: Diagnosis not present

## 2022-08-20 DIAGNOSIS — K739 Chronic hepatitis, unspecified: Secondary | ICD-10-CM | POA: Diagnosis not present

## 2022-08-22 ENCOUNTER — Encounter: Payer: Self-pay | Admitting: Hematology

## 2022-08-22 ENCOUNTER — Inpatient Hospital Stay (HOSPITAL_BASED_OUTPATIENT_CLINIC_OR_DEPARTMENT_OTHER): Payer: 59 | Admitting: Hematology

## 2022-08-22 DIAGNOSIS — R160 Hepatomegaly, not elsewhere classified: Secondary | ICD-10-CM

## 2022-08-22 NOTE — Progress Notes (Signed)
[facility]   Telephone:([phone] Fax:([phone]) [phone]   Clinic Follow up Note   Patient Care Team: Eloisa Northern, MD as PCP - General (Internal Medicine) Malachy Mood, MD as Attending Physician (Hematology and Oncology)  Date of Service:  08/22/2022  I connected with [name] on 08/22/2022 at 12:20 PM EDT by telephone visit and verified that I am speaking with the correct person using two identifiers.  I discussed the limitations, risks, security and privacy concerns of performing an evaluation and management service by telephone and the availability of in person appointments. I also discussed with the patient that there may be a patient responsible charge related to this service. The patient expressed understanding and agreed to proceed.   Other persons participating in the visit and their role in the encounter:  no  Patient's location:  home Provider's location:  [facility] office  CHIEF COMPLAINT: f/u of liver mass  CURRENT THERAPY:    ASSESSMENT & PLAN:  [name] is a 52 y.o. male with   1. Liver Mass -presented with coffee-ground emesis, admitted 01/20/22. CT AP showed liver cirrhosis, splenomegaly, and a 4.6 cm liver mass. Baseline AFP was WNL. -MRI showed 4.6 cm hemorrhagic mass in segment 8, difficult to characterize by LI-RADS due to lack non rim enhancement, but nonetheless concerning for [facility] until proven otherwise -liver biopsy on 12/27/21 showed predominantly necrotic tissue, no evidence of malignancy. -CT scan from May 22, 2022 showed stable liver mass -I previously reviewed with patient and his mother that although the biopsy was negative, malignancy is not completely ruled out.  -I personally reviewed his repeated liver MRI from Aug 15, 2022, which showed stable 4 cm hemorrhagic mass in segment 8, and a new 1.2 centimeter hypervascular mass in segment 4B, consistent with HCC, LI-RADS category 5.  I discussed the findings with  patient. -I recommend IR referral, for liver targeted therapy (ablation) of the small HCC, meantime biopsy of the 4 cm lesion.  He is agreeable.   2. Liver cirrhosis, Child-Pugh score 6, class A -he has a history of heavy alcohol consumption -cirrhosis and small liver lesions were seen on CT AP back in 07/2012 (no records prior to this). -I again encouraged him to stop drinking alcohol completely.  After his multiple hospital stay, he has decided to quit alcohol completely.  His family is helping him also.   3. Pancytopenia, chronic -secondary to splenomegaly and alcohol abuse -Worsened anemia due to his recent GI bleeding.  He required blood transfusion -His platelet was less than 10 in the hospital, required a blood transfusion -Platelets 31 today, is recovering since that he has stopped alcohol. -We discussed the role of TPO to improve his platelet count if he has procedure or surgery.  No needed treatment for now, we will continue monitoring.      PLAN: - I reviewed his Liver MRI  - I recommend interventional radiology referral  -I will message Dr. Marca Ancona for f/u his liver cirrhosis  -lab in 2 weeks, and f/u in 2 months    INTERVAL HISTORY:  [name] was contacted for a follow up of liver mass. He was last seen by me on 07/31/2022. Pt state that he is doing well.   All other systems were reviewed with the patient and are negative.  MEDICAL HISTORY:  Past Medical History:  Diagnosis Date   Alcoholic (HCC)    Anxiety    Diabetes (HCC)    DVT (deep venous  thrombosis) (HCC)    Hypertension     SURGICAL HISTORY: Past Surgical History:  Procedure Laterality Date   BLOOD PATCH     ESOPHAGOGASTRODUODENOSCOPY N/A 12/26/2021   Procedure: ESOPHAGOGASTRODUODENOSCOPY (EGD);  Surgeon: Paul Salen, MD;  Location: [facility];  Service: Gastroenterology;  Laterality: N/A;   TONSILLECTOMY      I have reviewed the social history and family history with the patient and they  are unchanged from previous note.  ALLERGIES:  has No Known Allergies.  MEDICATIONS:  Current Outpatient Medications  Medication Sig Dispense Refill   escitalopram (LEXAPRO) 10 MG tablet Take 1 tablet (10 mg total) by mouth daily. 30 tablet 2   folic acid (FOLVITE) 1 MG tablet Take 1 tablet (1 mg total) by mouth daily. 30 tablet 0   insulin glargine (LANTUS) 100 UNIT/ML Solostar Pen Inject 10 Units into the skin daily. 15 mL 2   lisinopril (ZESTRIL) 20 MG tablet Take 1 tablet (20 mg total) by mouth daily. 30 tablet 2   mirtazapine (REMERON) 15 MG tablet Take 1 tablet (15 mg total) by mouth at bedtime. (Patient taking differently: Take 15 mg by mouth at bedtime as needed (sleep).) 30 tablet 2   pantoprazole (PROTONIX) 40 MG tablet Take 1 tablet (40 mg total) by mouth daily. 30 tablet 2   tamsulosin (FLOMAX) 0.4 MG CAPS capsule Take 1 capsule (0.4 mg total) by mouth daily after supper. (Patient taking differently: Take 0.4 mg by mouth at bedtime.) 30 capsule 4   thiamine (VITAMIN B1) 100 MG tablet Take 1 tablet (100 mg total) by mouth daily. 30 tablet 3   No current facility-administered medications for this visit.    PHYSICAL EXAMINATION: ECOG PERFORMANCE STATUS: 1 - Symptomatic but completely ambulatory  There were no vitals filed for this visit. Wt Readings from Last 3 Encounters:  07/31/22 155 lb 12.8 oz (70.7 kg)  07/26/22 152 lb 1.9 oz (69 kg)  06/24/22 155 lb 3.2 oz (70.4 kg)      No vitals taken today, Exam not performed today  LABORATORY DATA:  I have reviewed the data as listed    Latest Ref Rng & Units 07/31/2022    8:50 AM 07/27/2022    4:48 AM 07/26/2022    3:03 AM  CBC  WBC 4.0 - 10.5 K/uL 2.0  1.6  2.0   Hemoglobin 13.0 - 17.0 g/dL 40.9  81.1  91.4   Hematocrit 39.0 - 52.0 % 39.4  32.7  32.2   Platelets 150 - 400 K/uL 31  11  10          Latest Ref Rng & Units 07/27/2022    4:48 AM 07/26/2022    3:03 AM 07/25/2022   10:29 AM  CMP  Glucose 70 - 99 mg/dL 782  956   213   BUN 6 - 20 mg/dL 6  7  7    Creatinine 0.61 - 1.24 mg/dL 0.86  5.78  4.69   Sodium 135 - 145 mmol/L 135  130  132   Potassium 3.5 - 5.1 mmol/L 3.3  3.5  3.0   Chloride 98 - 111 mmol/L 103  99  100   CO2 22 - 32 mmol/L 21  20  21    Calcium 8.9 - 10.3 mg/dL 8.1  8.1  8.2   Total Protein 6.5 - 8.1 g/dL 5.8  6.0    Total Bilirubin 0.3 - 1.2 mg/dL 1.7  1.6    Alkaline Phos 38 - 126 U/L 72  75    AST 15 - 41 U/L 49  53    ALT 0 - 44 U/L 46  47        RADIOGRAPHIC STUDIES: I have personally reviewed the radiological images as listed and agreed with the findings in the report. No results found.    Orders Placed This Encounter  Procedures   Ambulatory referral to Interventional Radiology    Referral Priority:   Routine    Referral Type:   Consultation    Referral Reason:   Specialty Services Required    Requested Specialty:   Interventional Radiology    Number of Visits Requested:   1   All questions were answered. The patient knows to call the clinic with any problems, questions or concerns. No barriers to learning was detected. The total time spent in the appointment was 21 minutes.     Malachy Mood, MD 08/22/2022   Carolin Coy am acting as scribe for Malachy Mood, MD.   I have reviewed the above documentation for accuracy and completeness, and I agree with the above.

## 2022-08-25 ENCOUNTER — Other Ambulatory Visit: Payer: Self-pay

## 2022-08-25 DIAGNOSIS — R160 Hepatomegaly, not elsewhere classified: Secondary | ICD-10-CM

## 2022-08-25 NOTE — Progress Notes (Signed)
Please see below for staff message orders and conversation regarding liver biopsy for pt's rt liver lesion.  Order was placed and Central Scheduling contacted to work on getting procedure scheduled.   Malachy Mood, MD  Oley Balm, MD; Arcelia Jew, RN Thanks!  Olegario Shearer, please order US guided core biopsy of R liver lesion and get it scheduled with IR.  Terrace Arabia       Previous Messages    ----- Message ----- From: Oley Balm, MD Sent: 08/23/2022   9:04 AM EDT To: Malachy Mood, MD  Yes that sounds like a good idea, some enhancement inside the lesion, deserves another look under the microscope [place] core biopsy R liver lesion  ----- Message ----- From: Malachy Mood, MD Sent: 08/22/2022   5:57 PM EDT To: Oley Balm, MD  Dr. Deanne Coffer,  This guy has alcohol liver cirrhosis, has been hospitalized for multiple times for alcohol toxicity and severe thrombocytopenia.  He has a 4 cm hemorrhagic mass in the liver, your biopsies last year which showed necrotic tissue only.  Repeated MRI earlier this week showed this lesion is stable, however he now has a 1.2 cm LI-RADS 5 new HCC.  I have referred him to IR, to consider ablation the small lesion? Could you repeat biopsy of the 4cm necrotic lesion again?  Thx  Terrace Arabia

## 2022-08-26 NOTE — Progress Notes (Signed)
Mir, Al Corpus, MD  Leodis Rains D PROCEDURE / BIOPSY REVIEW Date: 08/26/22  Requested Biopsy site: Right liver mass (Seg 8) Reason for request: Cirrhosis.  Possible HCC. Imaging review: Best seen on MRI Abdomen 08/15/2022  Decision: Approved Imaging modality to perform: Ultrasound Schedule with: Moderate Sedation Schedule for: Any VIR  Additional comments: Previously biopsied on 12/27/2021 did not show any tumor.  Per MRI reports, its difficult to characterize due to internal hemorrhage, but remains suspicious for HCC.  Most recent MRI shows some internal enhancement.  The mass is new compared to prior CT from 2014.  Please contact me with questions, concerns, or if issue pertaining to this request arise.  Al Corpus Mir, MD [facility]

## 2022-09-01 ENCOUNTER — Other Ambulatory Visit: Payer: Self-pay | Admitting: Radiology

## 2022-09-01 DIAGNOSIS — R160 Hepatomegaly, not elsewhere classified: Secondary | ICD-10-CM

## 2022-09-02 ENCOUNTER — Ambulatory Visit (HOSPITAL_COMMUNITY): Payer: 59

## 2022-09-02 ENCOUNTER — Other Ambulatory Visit: Payer: Self-pay

## 2022-09-02 DIAGNOSIS — R160 Hepatomegaly, not elsewhere classified: Secondary | ICD-10-CM

## 2022-09-02 DIAGNOSIS — D61818 Other pancytopenia: Secondary | ICD-10-CM

## 2022-09-02 DIAGNOSIS — D649 Anemia, unspecified: Secondary | ICD-10-CM

## 2022-09-02 NOTE — Progress Notes (Incomplete)
Referring Physician(s): Feng,Yan  Supervising Physician: Richarda Overlie  Patient Status:  [facility] OP  Chief Complaint:  "I'm getting a liver biopsy"   Subjective: Patient known to IR service from right liver lesion biopsy on 12/27/2021 which yielded necrotic tissue and scant benign liver parenchyma with ductular reaction.  He is a 52 year old male with history of alcoholic cirrhosis, chronic pancytopenia, splenomegaly, anxiety, diabetes, DVT, hypertension.  Latest MRI of the liver performed on 08/15/22 revealed:   Cirrhosis and stable moderate splenomegaly.   Stable 4 cm hemorrhagic mass in segment 8 of the left hepatic lobe. This remains difficult to characterize by LI-RADS criteria due to hemorrhagic signal intensity limiting evaluation of contrast enhancement. Hepatocellular carcinoma cannot be excluded.   New 1.2 cm hypervascular mass in segment 4B of the left lobe. (LI-RADS Category 5: Definitely HCC)   No evidence of abdominal metastatic disease.   Small hiatal hernia.  He presents today for repeat right liver lesion biopsy for further evaluation.     Past Medical History:  Diagnosis Date   Alcoholic (HCC)    Anxiety    Diabetes (HCC)    DVT (deep venous thrombosis) (HCC)    Hypertension    Past Surgical History:  Procedure Laterality Date   BLOOD PATCH     ESOPHAGOGASTRODUODENOSCOPY N/A 12/26/2021   Procedure: ESOPHAGOGASTRODUODENOSCOPY (EGD);  Surgeon: Kerin Salen, MD;  Location: [facility];  Service: Gastroenterology;  Laterality: N/A;   TONSILLECTOMY        Allergies: Patient has no known allergies.  Medications: Prior to Admission medications   Medication Sig Start Date End Date Taking? Authorizing Provider  escitalopram (LEXAPRO) 10 MG tablet Take 1 tablet (10 mg total) by mouth daily. 07/28/22   Shalhoub, Deno Lunger, MD  folic acid (FOLVITE) 1 MG tablet Take 1 tablet (1 mg total) by mouth daily. 08/06/22   Eloisa Northern, MD  insulin glargine (LANTUS) 100  UNIT/ML Solostar Pen Inject 10 Units into the skin daily. 07/27/22   Shalhoub, Deno Lunger, MD  lisinopril (ZESTRIL) 20 MG tablet Take 1 tablet (20 mg total) by mouth daily. 07/28/22   Shalhoub, Deno Lunger, MD  mirtazapine (REMERON) 15 MG tablet Take 1 tablet (15 mg total) by mouth at bedtime. Patient taking differently: Take 15 mg by mouth at bedtime as needed (sleep). 04/07/22   Eloisa Northern, MD  pantoprazole (PROTONIX) 40 MG tablet Take 1 tablet (40 mg total) by mouth daily. 07/27/22 10/25/22  Shalhoub, Deno Lunger, MD  tamsulosin (FLOMAX) 0.4 MG CAPS capsule Take 1 capsule (0.4 mg total) by mouth daily after supper. Patient taking differently: Take 0.4 mg by mouth at bedtime. 04/03/22   Eloisa Northern, MD  thiamine (VITAMIN B1) 100 MG tablet Take 1 tablet (100 mg total) by mouth daily. 08/06/22   Eloisa Northern, MD     Vital Signs:   Code Status:   Physical Exam  Imaging: No results found.  Labs:  CBC: Recent Labs    07/25/22 1029 07/26/22 0303 07/27/22 0448 07/31/22 0850  WBC 1.7* 2.0* 1.6* 2.0*  HGB 11.0* 10.9* 10.8* 12.7*  HCT 32.8* 32.2* 32.7* 39.4  PLT 9* 10* 11* 31*    COAGS: Recent Labs    12/24/21 1143 12/27/21 0400 05/22/22 1901 07/24/22 0251  INR 1.6* 1.5* 1.3* 1.3*  APTT  --   --   --  28    BMP: Recent Labs    07/24/22 0251 07/25/22 1029 07/26/22 0303 07/27/22 0448  NA 131* 132* 130* 135  K 3.4* 3.0* 3.5 3.3*  CL 98 100 99 103  CO2 24 21* 20* 21*  GLUCOSE 186* 121* 133* 139*  BUN 6 7 7 6   CALCIUM 7.7* 8.2* 8.1* 8.1*  CREATININE 0.59* 0.60* 0.55* 0.63  GFRNONAA >60 >60 >60 >60    LIVER FUNCTION TESTS: Recent Labs    07/22/22 2159 07/24/22 0251 07/26/22 0303 07/27/22 0448  BILITOT 1.4* 1.9* 1.6* 1.7*  AST 135* 73* 53* 49*  ALT 83* 55* 47* 46*  ALKPHOS 91 64 75 72  PROT 6.9 5.6* 6.0* 5.8*  ALBUMIN 3.7 3.0* 3.3* 3.2*    Assessment and Plan: 52 year old male with history of alcoholic cirrhosis, chronic pancytopenia, splenomegaly, anxiety, diabetes, DVT,  hypertension.  Status post liver lesion biopsy on 12/27/2021 yielding necrotic tissue with scant benign liver parenchyma ; AFP in May of this year was 2.7;  latest MRI of the liver performed on 08/15/22 revealed:   Cirrhosis and stable moderate splenomegaly.   Stable 4 cm hemorrhagic mass in segment 8 of the left hepatic lobe. This remains difficult to characterize by LI-RADS criteria due to hemorrhagic signal intensity limiting evaluation of contrast enhancement. Hepatocellular carcinoma cannot be excluded.   New 1.2 cm hypervascular mass in segment 4B of the left lobe. (LI-RADS Category 5: Definitely HCC)   No evidence of abdominal metastatic disease.   Small hiatal hernia.  He presents today for repeat right liver lesion biopsy for further evaluation.Risks and benefits of procedure was discussed with the patient  including, but not limited to bleeding, infection, damage to adjacent structures or low yield requiring additional tests.  All of the questions were answered and there is agreement to proceed.  Consent signed and in chart.    Electronically Signed: D. Jeananne Rama, PA-C 09/02/2022, 3:18 PM   I spent a total of 20 minutes at the the patient's bedside AND on the patient's hospital floor or unit, greater than 50% of which was counseling/coordinating care for image guided liver lesion biopsy

## 2022-09-03 ENCOUNTER — Inpatient Hospital Stay: Payer: 59 | Attending: Hematology

## 2022-09-03 ENCOUNTER — Ambulatory Visit (HOSPITAL_COMMUNITY)
Admission: RE | Admit: 2022-09-03 | Discharge: 2022-09-03 | Disposition: A | Discharge status: Home / Self Care | Payer: 59 | Source: Ambulatory Visit | Attending: Hematology | Admitting: Hematology

## 2022-09-03 ENCOUNTER — Encounter (HOSPITAL_COMMUNITY): Payer: Self-pay

## 2022-09-03 ENCOUNTER — Other Ambulatory Visit: Payer: Self-pay

## 2022-09-03 DIAGNOSIS — R162 Hepatomegaly with splenomegaly, not elsewhere classified: Secondary | ICD-10-CM | POA: Insufficient documentation

## 2022-09-03 DIAGNOSIS — Z86718 Personal history of other venous thrombosis and embolism: Secondary | ICD-10-CM | POA: Insufficient documentation

## 2022-09-03 DIAGNOSIS — D61818 Other pancytopenia: Secondary | ICD-10-CM

## 2022-09-03 DIAGNOSIS — K7460 Unspecified cirrhosis of liver: Secondary | ICD-10-CM | POA: Insufficient documentation

## 2022-09-03 DIAGNOSIS — K449 Diaphragmatic hernia without obstruction or gangrene: Secondary | ICD-10-CM | POA: Insufficient documentation

## 2022-09-03 DIAGNOSIS — D649 Anemia, unspecified: Secondary | ICD-10-CM

## 2022-09-03 DIAGNOSIS — R160 Hepatomegaly, not elsewhere classified: Secondary | ICD-10-CM

## 2022-09-03 DIAGNOSIS — I10 Essential (primary) hypertension: Secondary | ICD-10-CM | POA: Insufficient documentation

## 2022-09-03 DIAGNOSIS — E119 Type 2 diabetes mellitus without complications: Secondary | ICD-10-CM | POA: Insufficient documentation

## 2022-09-03 DIAGNOSIS — D696 Thrombocytopenia, unspecified: Secondary | ICD-10-CM | POA: Insufficient documentation

## 2022-09-03 DIAGNOSIS — F419 Anxiety disorder, unspecified: Secondary | ICD-10-CM | POA: Insufficient documentation

## 2022-09-03 LAB — CBC WITH DIFFERENTIAL/PLATELET
Abs Immature Granulocytes: 0.01 10*3/uL (ref 0.00–0.07)
Abs Immature Granulocytes: 0.01 10*3/uL (ref 0.00–0.07)
Basophils Absolute: 0 10*3/uL (ref 0.0–0.1)
Basophils Absolute: 0 10*3/uL (ref 0.0–0.1)
Basophils Relative: 1 %
Basophils Relative: 0 %
Eosinophils Absolute: 0.1 10*3/uL (ref 0.0–0.5)
Eosinophils Absolute: 0 10*3/uL (ref 0.0–0.5)
Eosinophils Relative: 2 %
Eosinophils Relative: 1 %
HCT: 38.8 % — ABNORMAL LOW (ref 39.0–52.0)
HCT: 40 % (ref 39.0–52.0)
Hemoglobin: 13.4 g/dL (ref 13.0–17.0)
Hemoglobin: 12.9 g/dL — ABNORMAL LOW (ref 13.0–17.0)
Immature Granulocytes: 0 %
Immature Granulocytes: 0 %
Lymphocytes Relative: 19 %
Lymphocytes Relative: 15 %
Lymphs Abs: 0.5 10*3/uL — ABNORMAL LOW (ref 0.7–4.0)
Lymphs Abs: 0.4 10*3/uL — ABNORMAL LOW (ref 0.7–4.0)
MCH: 26.9 pg (ref 26.0–34.0)
MCH: 25.8 pg — ABNORMAL LOW (ref 26.0–34.0)
MCHC: 34.5 g/dL (ref 30.0–36.0)
MCHC: 32.3 g/dL (ref 30.0–36.0)
MCV: 77.8 fL — ABNORMAL LOW (ref 80.0–100.0)
MCV: 80 fL (ref 80.0–100.0)
Monocytes Absolute: 0.2 10*3/uL (ref 0.1–1.0)
Monocytes Absolute: 0.2 10*3/uL (ref 0.1–1.0)
Monocytes Relative: 6 %
Monocytes Relative: 8 %
Neutro Abs: 1.9 10*3/uL (ref 1.7–7.7)
Neutro Abs: 2.1 10*3/uL (ref 1.7–7.7)
Neutrophils Relative %: 72 %
Neutrophils Relative %: 76 %
Platelets: 33 10*3/uL — ABNORMAL LOW (ref 150–400)
Platelets: 31 10*3/uL — ABNORMAL LOW (ref 150–400)
RBC: 4.99 MIL/uL (ref 4.22–5.81)
RBC: 5 MIL/uL (ref 4.22–5.81)
RDW: 18.6 % — ABNORMAL HIGH (ref 11.5–15.5)
RDW: 18.9 % — ABNORMAL HIGH (ref 11.5–15.5)
WBC: 2.7 10*3/uL — ABNORMAL LOW (ref 4.0–10.5)
WBC: 2.9 10*3/uL — ABNORMAL LOW (ref 4.0–10.5)
nRBC: 0 % (ref 0.0–0.2)
nRBC: 0 % (ref 0.0–0.2)

## 2022-09-03 LAB — COMPREHENSIVE METABOLIC PANEL
ALT: 41 U/L (ref 0–44)
AST: 36 U/L (ref 15–41)
Albumin: 3.4 g/dL — ABNORMAL LOW (ref 3.5–5.0)
Alkaline Phosphatase: 43 U/L (ref 38–126)
Anion gap: 8 (ref 5–15)
BUN: 7 mg/dL (ref 6–20)
CO2: 26 mmol/L (ref 22–32)
Calcium: 8.8 mg/dL — ABNORMAL LOW (ref 8.9–10.3)
Chloride: 106 mmol/L (ref 98–111)
Creatinine, Ser: 0.71 mg/dL (ref 0.61–1.24)
GFR, Estimated: >60 mL/min (ref >60)
Glucose, Bld: 209 mg/dL — ABNORMAL HIGH (ref 70–99)
Potassium: 3.9 mmol/L (ref 3.5–5.1)
Sodium: 140 mmol/L (ref 135–145)
Total Bilirubin: 1 mg/dL (ref 0.3–1.2)
Total Protein: 6.5 g/dL (ref 6.5–8.1)

## 2022-09-03 LAB — PROTIME-INR
INR: 1.2 (ref 0.8–1.2)
Prothrombin Time: 15.8 seconds — ABNORMAL HIGH (ref 11.4–15.2)

## 2022-09-03 LAB — GLUCOSE, CAPILLARY: Glucose-Capillary: 200 mg/dL — ABNORMAL HIGH (ref 70–99)

## 2022-09-03 LAB — FERRITIN: Ferritin: 18 ng/mL — ABNORMAL LOW (ref 24–336)

## 2022-09-03 MED ORDER — HYDROCODONE-ACETAMINOPHEN 5-325 MG PO TABS: 1.0000 | ORAL_TABLET | ORAL | Status: DC | PRN

## 2022-09-03 MED ORDER — LIDOCAINE HCL 1 % IJ SOLN
INTRAMUSCULAR | Status: AC
  Filled 2022-09-03: qty 20

## 2022-09-03 MED ORDER — GELATIN ABSORBABLE 12-7 MM EX MISC
CUTANEOUS | Status: AC
  Filled 2022-09-03: qty 1

## 2022-09-03 MED ORDER — SODIUM CHLORIDE 0.9 % IV SOLN: INTRAVENOUS | Status: DC

## 2022-09-03 MED ORDER — MIDAZOLAM HCL 2 MG/2ML IJ SOLN
INTRAMUSCULAR | Status: DC | PRN
  Administered 2022-09-03 (×2): 1 mg via INTRAVENOUS
  Administered 2022-09-03: 2 mg via INTRAVENOUS

## 2022-09-03 MED ORDER — FENTANYL CITRATE (PF) 100 MCG/2ML IJ SOLN
INTRAMUSCULAR | Status: DC | PRN
  Administered 2022-09-03 (×2): 50 ug via INTRAVENOUS

## 2022-09-03 MED ORDER — MIDAZOLAM HCL 2 MG/2ML IJ SOLN
INTRAMUSCULAR | Status: AC
  Filled 2022-09-03: qty 4

## 2022-09-03 MED ORDER — SODIUM CHLORIDE 0.9% IV SOLUTION: Freq: Once | INTRAVENOUS | Status: AC

## 2022-09-03 MED ORDER — FENTANYL CITRATE (PF) 100 MCG/2ML IJ SOLN
INTRAMUSCULAR | Status: AC
  Filled 2022-09-03: qty 2

## 2022-09-03 NOTE — Discharge Instructions (Signed)
Liver Biopsy, Care After   Urgent needs - Interventional Radiology clinic [phone]  You may have some pain where the biopsy needle entered your skin (the procedure site). You may also have pain in your shoulder. This is called referred pain. It is caused by pain travelling along a nerve near the biopsy site. The referred pain usually lasts less than 12 hours. You may have a small amount of bleeding from the procedure site.  Medicines Your doctor will tell you if and when you can restart your medicines. They will also give you instructions about taking any new medicines. If you take blood thinners, ask your doctor if and when to start taking them again. Make sure that you understand exactly what your doctor wants you to do. Be safe with medicines. Take pain medicines exactly as directed. If the doctor gave you a prescription medicine for pain, take it as prescribed. If you are not taking a prescription pain medicine, take an over-the-counter medicine that your doctor recommends. Read and follow all instructions on the label. If you were prescribed an antibiotic, take it as prescribed by your doctor. DO NOT STOP TAKING-EVEN IF YOU FEEL BETTER!  Care of the procedure site When you leave the hospital, you may have a dressing covering the procedure site. Leave the dressing on until the morning after the procedure, then change it (ask your healthcare provider how to change the dressing). It's very important to keep the site clean and dry. You may shower 24 to 48 hours after the procedure. Don't scrub the procedure site. Pat the site dry. Don't take baths, use hot tubs, or go swimming until the procedure site no longer has a scab and is completely healed. Don't use any creams, lotions, or ointments on the procedure site.  How can you care for yourself at home? If you have no problems after the procedure, you can go home and rest for the day. Have a responsible adult take you home (do not drive  yourself). If you live out of town, it's a good idea for you to stay somewhere overnight within 1 hour of an emergency care hospital. Don't drive for the next 24 hours or while you're taking strong pain medicine.  Activity Rest when you feel tired. Getting enough sleep will help you recover. Try to walk each day. Start by walking a little more than you did the day before. Bit by bit, increase the amount you walk. Walking boosts blood flow and helps prevent pneumonia and constipation. Avoid exercises that use your belly muscles and strenuous activities, such as bicycle riding, jogging, weight lifting, or aerobic exercise, for 1 week or until your doctor says it is okay. You will probably need to take a few days off from work. It depends on the type of work you do and how you feel. If your job includes heavy lifting using machines, doing hard activity, talk to your doctor about when you can go back to work. Don't lift or carry anything heavier than 4.5 kg (10 lb.) for 3 days. As you feel ready, try to do a little more activity each day for the next 7 days after the procedure. Call 911 anytime you think you may need emergency care. For example, call if: You passed out (lost consciousness). You have severe trouble breathing (shortness of breath). You have sudden chest pain and shortness of breath, or you cough up blood. You have severe pain in your chest, shoulder, or belly. You have bleeding from the  procedure site that doesn't stop (for example bright red blood has soaked through your bandage). Call your doctor or nurse advice line now or seek immediate medical care if: You have new bleeding from the procedure site. You have pain that does not get better after you take your pain medicine. You are sick to your stomach or cannot keep fluids down (you are throwing them up). You have a fever, chills, or body aches. You have signs of infection, such as: Increased pain, swelling, warmth, or redness. Red  streaks leading from the procedure site. Pus draining from the procedure site. Swollen lymph nodes in your neck, armpits, or groin. A fever. You have new or worse pain at the procedure site. You have new or worse belly swelling or bloating. You have trouble peeing or passing stool (poop). Your stools are black and tar-like or have streaks of blood. You have pale-colored stools along with dark urine and itching. You have yellowing of your eyes or skin that is not normal for you.  Watch closely for changes in your health, and be sure to contact your doctor if you have any problems.

## 2022-09-03 NOTE — Procedures (Signed)
Interventional Radiology Procedure:   Indications: Indeterminate right hepatic lesion, previous biopsy was inconclusive. Thrombocytopenia and platelets given before and during the biopsy.    Procedure: US guided liver lesion biopsy  Findings: Lesion at hepatic dome is very difficult to visualize due to location and posterior acoustic shadowing in the lesion.  4 cores obtained from periphery of the lesion.  Gelfoam slurry injected.  No immediate bleeding  Complications: None     EBL: Minimal  Plan: Bedrest 3 hours  Sahar Ryback R. Lowella Dandy, MD  Pager: [phone]

## 2022-09-04 LAB — PREPARE PLATELET PHERESIS
Unit division: 0
Unit division: 0

## 2022-09-04 LAB — BPAM PLATELET PHERESIS
Blood Product Expiration Date: 202406132359
Blood Product Expiration Date: 202406132359
ISSUE DATE / TIME: 202406121223
ISSUE DATE / TIME: 202406121302
Unit Type and Rh: 5100
Unit Type and Rh: 7300

## 2022-09-04 LAB — SURGICAL PATHOLOGY

## 2022-09-09 ENCOUNTER — Other Ambulatory Visit: Payer: Self-pay | Admitting: Internal Medicine

## 2022-09-10 ENCOUNTER — Other Ambulatory Visit: Payer: Self-pay

## 2022-09-15 ENCOUNTER — Encounter: Payer: Self-pay | Admitting: Emergency Medicine

## 2022-09-15 ENCOUNTER — Emergency Department
Admission: EM | Admit: 2022-09-15 | Discharge: 2022-09-15 | Disposition: A | Discharge status: Home / Self Care | Payer: 59 | Attending: Student in an Organized Health Care Education/Training Program | Admitting: Student in an Organized Health Care Education/Training Program

## 2022-09-15 ENCOUNTER — Other Ambulatory Visit (HOSPITAL_COMMUNITY): Payer: 59

## 2022-09-15 ENCOUNTER — Emergency Department: Payer: 59

## 2022-09-15 DIAGNOSIS — Z23 Encounter for immunization: Secondary | ICD-10-CM | POA: Insufficient documentation

## 2022-09-15 DIAGNOSIS — R456 Violent behavior: Secondary | ICD-10-CM | POA: Diagnosis not present

## 2022-09-15 DIAGNOSIS — S0181XA Laceration without foreign body of other part of head, initial encounter: Secondary | ICD-10-CM

## 2022-09-15 DIAGNOSIS — F10929 Alcohol use, unspecified with intoxication, unspecified: Secondary | ICD-10-CM | POA: Diagnosis not present

## 2022-09-15 DIAGNOSIS — W01198A Fall on same level from slipping, tripping and stumbling with subsequent striking against other object, initial encounter: Secondary | ICD-10-CM | POA: Insufficient documentation

## 2022-09-15 DIAGNOSIS — R110 Nausea: Secondary | ICD-10-CM | POA: Diagnosis not present

## 2022-09-15 DIAGNOSIS — F10129 Alcohol abuse with intoxication, unspecified: Secondary | ICD-10-CM | POA: Diagnosis not present

## 2022-09-15 DIAGNOSIS — W19XXXA Unspecified fall, initial encounter: Secondary | ICD-10-CM | POA: Diagnosis not present

## 2022-09-15 DIAGNOSIS — S0990XA Unspecified injury of head, initial encounter: Secondary | ICD-10-CM | POA: Diagnosis not present

## 2022-09-15 DIAGNOSIS — S01112A Laceration without foreign body of left eyelid and periocular area, initial encounter: Secondary | ICD-10-CM | POA: Diagnosis not present

## 2022-09-15 DIAGNOSIS — Y908 Blood alcohol level of 240 mg/100 ml or more: Secondary | ICD-10-CM | POA: Diagnosis not present

## 2022-09-15 DIAGNOSIS — R000 Tachycardia, unspecified: Secondary | ICD-10-CM | POA: Diagnosis not present

## 2022-09-15 LAB — COMPREHENSIVE METABOLIC PANEL
ALT: 43 U/L (ref 0–44)
AST: 63 U/L — ABNORMAL HIGH (ref 15–41)
Albumin: 4.1 g/dL (ref 3.5–5.0)
Alkaline Phosphatase: 60 U/L (ref 38–126)
Anion gap: 13 (ref 5–15)
BUN: 5 mg/dL — ABNORMAL LOW (ref 6–20)
CO2: 22 mmol/L (ref 22–32)
Calcium: 8.5 mg/dL — ABNORMAL LOW (ref 8.9–10.3)
Chloride: 100 mmol/L (ref 98–111)
Creatinine, Ser: 0.5 mg/dL — ABNORMAL LOW (ref 0.61–1.24)
GFR, Estimated: >60 mL/min (ref >60)
Glucose, Bld: 183 mg/dL — ABNORMAL HIGH (ref 70–99)
Potassium: 3.4 mmol/L — ABNORMAL LOW (ref 3.5–5.1)
Sodium: 136 mmol/L (ref 135–145)
Total Bilirubin: 1.5 mg/dL — ABNORMAL HIGH (ref 0.3–1.2)
Total Protein: 6.9 g/dL (ref 6.5–8.1)

## 2022-09-15 LAB — CBC WITH DIFFERENTIAL/PLATELET
Abs Immature Granulocytes: 0.02 10*3/uL (ref 0.00–0.07)
Basophils Absolute: 0 10*3/uL (ref 0.0–0.1)
Basophils Relative: 1 %
Eosinophils Absolute: 0.2 10*3/uL (ref 0.0–0.5)
Eosinophils Relative: 3 %
HCT: 42.3 % (ref 39.0–52.0)
Hemoglobin: 14.3 g/dL (ref 13.0–17.0)
Immature Granulocytes: 0 %
Lymphocytes Relative: 17 %
Lymphs Abs: 1.1 10*3/uL (ref 0.7–4.0)
MCH: 26.1 pg (ref 26.0–34.0)
MCHC: 33.8 g/dL (ref 30.0–36.0)
MCV: 77.3 fL — ABNORMAL LOW (ref 80.0–100.0)
Monocytes Absolute: 0.3 10*3/uL (ref 0.1–1.0)
Monocytes Relative: 4 %
Neutro Abs: 4.8 10*3/uL (ref 1.7–7.7)
Neutrophils Relative %: 75 %
Platelets: 63 10*3/uL — ABNORMAL LOW (ref 150–400)
RBC: 5.47 MIL/uL (ref 4.22–5.81)
RDW: 18.9 % — ABNORMAL HIGH (ref 11.5–15.5)
WBC: 6.4 10*3/uL (ref 4.0–10.5)
nRBC: 0 % (ref 0.0–0.2)

## 2022-09-15 LAB — ETHANOL: Alcohol, Ethyl (B): 418 mg/dL (ref <10)

## 2022-09-15 MED ORDER — TETANUS-DIPHTH-ACELL PERTUSSIS 5-2.5-18.5 LF-MCG/0.5 IM SUSY
0.5000 mL | PREFILLED_SYRINGE | Freq: Once | INTRAMUSCULAR | Status: AC
  Administered 2022-09-15: 0.5 mL via INTRAMUSCULAR
  Filled 2022-09-15: qty 0.5

## 2022-09-15 MED ORDER — LIDOCAINE-EPINEPHRINE-TETRACAINE (LET) TOPICAL GEL
3.0000 mL | Freq: Once | TOPICAL | Status: AC
  Administered 2022-09-15: 3 mL via TOPICAL
  Filled 2022-09-15: qty 3

## 2022-09-15 NOTE — ED Triage Notes (Signed)
Pt arrived via [facility] with [facility] following after pt fell after consuming unknown alcohol throughout today at friends house. Per EMS, pt initially combative and had to have hands-on x2 by [facility] department. EMS reports pt fell and hit his head on concrete without LOC. Pt arrives to ED cooperative, calm but intermittently boisterous. Pt has approx 1 inch laceration to the Lt eyebrow and abrasions to the Rt elbow. EMS reports that pt was initially 90% oxygen saturation on room air and they started pt on 2L Oxygen by nasal cannula.    ** EMS: 18g Rt Wrist

## 2022-09-15 NOTE — ED Provider Notes (Signed)
[facility] Provider Note    Event Date/Time   First MD Initiated Contact with Patient 09/15/22 1945     (approximate)   History   Fall and Alcohol Intoxication   HPI  [name] is a 52 y.o. male who presents via EMS and PD after patient became intoxicated at his friend's pool.  Larey Seat and hit his head.  Please, call close patient was intoxicated and agitated as refusing valuation transport.  He has a history of chronic thrombocytopenia.  Denies any shortness of breath no chest pain no abdominal pain.  Able to stand without pain.     Physical Exam   Triage Vital Signs: ED Triage Vitals  Enc Vitals Group     BP --      Pulse --      Resp --      Temp --      Temp src --      SpO2 09/15/22 1932 90 %     Weight --      Height --      Head Circumference --      Peak Flow --      Pain Score 09/15/22 1945 7     Pain Loc --      Pain Edu? --      Excl. in GC? --     Most recent vital signs: Vitals:   09/15/22 1950 09/15/22 1955  BP: (!) 147/89 (!) 147/89  Pulse: 85 85  Resp: 17 17  Temp: 98.6 F (37 C) 98.6 F (37 C)  SpO2: 95% 95%     Constitutional: Alert, intoxicated. Eyes: Conjunctivae are normal.  Head: Contusion and abrasion to left eyebrow and forehead with 1 cm laceration in the lateral eyebrow.  No sign of skull fracture. Nose: No congestion/rhinnorhea. Mouth/Throat: Mucous membranes are moist.   Neck: Painless ROM.  Cardiovascular:   Good peripheral circulation. Respiratory: Normal respiratory effort.  No retractions.  Gastrointestinal: Soft and nontender.  Musculoskeletal: Abrasion of the right elbow.  Hemostatic.  Able to fully rotate both supinate and pronate as well as flex and extend his elbow.  No bony tenderness to palpation. Neurologic:  MAE spontaneously. No gross focal neurologic deficits are appreciated.  Skin:  Skin is warm, dry and intact. No rash noted. Psychiatric: Mood and affect are normal. Speech  and behavior are normal.    ED Results / Procedures / Treatments   Labs (all labs ordered are listed, but only abnormal results are displayed) Labs Reviewed  CBC WITH DIFFERENTIAL/PLATELET - Abnormal; Notable for the following components:      Result Value   MCV 77.3 (*)    RDW 18.9 (*)    Platelets 63 (*)    All other components within normal limits  COMPREHENSIVE METABOLIC PANEL - Abnormal; Notable for the following components:   Potassium 3.4 (*)    Glucose, Bld 183 (*)    BUN 5 (*)    Creatinine, Ser 0.50 (*)    Calcium 8.5 (*)    AST 63 (*)    Total Bilirubin 1.5 (*)    All other components within normal limits  ETHANOL - Abnormal; Notable for the following components:   Alcohol, Ethyl (B) 418 (*)    All other components within normal limits     EKG     RADIOLOGY Please see ED Course for my review and interpretation.  I personally reviewed all radiographic images ordered to evaluate for the above  acute complaints and reviewed radiology reports and findings.  These findings were personally discussed with the patient.  Please see medical record for radiology report.    PROCEDURES:  Critical Care performed:   Marland KitchenMarland KitchenLaceration Repair  Date/Time: 09/15/2022 10:10 PM  Performed by: Willy Eddy, MD Authorized by: Willy Eddy, MD   Anesthesia:    Anesthesia method:  Topical application   Topical anesthetic:  LET Laceration details:    Location:  Face   Face location:  L eyebrow   Length (cm):  1   Depth (mm):  2 Skin repair:    Repair method:  Sutures   Suture size:  5-0   Suture material:  Prolene   Suture technique:  Simple interrupted   Number of sutures:  1    MEDICATIONS ORDERED IN ED: Medications  lidocaine-EPINEPHrine-tetracaine (LET) topical gel (3 mLs Topical Given 09/15/22 2047)  Tdap (BOOSTRIX) injection 0.5 mL (0.5 mLs Intramuscular Given 09/15/22 2209)     IMPRESSION / MDM / ASSESSMENT AND PLAN / ED COURSE  I reviewed the  triage vital signs and the nursing notes.                              Differential diagnosis includes, but is not limited to, laceration, abrasion, contusion, SDH, IPH, concussion, fracture  Patient presenting to the ER for evaluation of symptoms as described above.  Based on symptoms, risk factors and considered above differential, this presenting complaint could reflect a potentially life-threatening illness therefore the patient will be placed on continuous pulse oximetry and telemetry for monitoring.  Laboratory evaluation will be sent to evaluate for the above complaints.  CT imaging will be ordered for the above differential.   Clinical Course as of 09/15/22 2212  Mon Sep 15, 2022  2102 CT head a my review and interpretation without evidence of SDH or IPH. [PR]  2158 Patient much more sober now.  He is asking about calling his wife to get a ride home.  Tolerated lack repair well without complication.  Imaging is reassuring.  I do think he be appropriate for discharge home with safe ride. [PR]    Clinical Course User Index [PR] Willy Eddy, MD     FINAL CLINICAL IMPRESSION(S) / ED DIAGNOSES   Final diagnoses:  Alcoholic intoxication with complication (HCC)  Injury of head, initial encounter  Laceration of eyebrow and forehead, left, initial encounter     Rx / DC Orders   ED Discharge Orders     None        Note:  This document was prepared using Dragon voice recognition software and may include unintentional dictation errors.    Willy Eddy, MD 09/15/22 2212

## 2022-09-21 ENCOUNTER — Other Ambulatory Visit: Payer: Self-pay | Admitting: Internal Medicine

## 2022-11-02 NOTE — Assessment & Plan Note (Deleted)
-  he has a history of heavy alcohol consumption -cirrhosis and small liver lesions were seen on CT AP back in 07/2012 (no records prior to this). -I again encouraged him to stop drinking alcohol completely.  After his multiple hospital stay, he has decided to quit alcohol completely.  His family is helping him also. -He was evaluated on September 15, 2022 after he got drunk at a friend's party and felt and hit his head.

## 2022-11-02 NOTE — Assessment & Plan Note (Deleted)
-  secondary to splenomegaly and alcohol abuse -Worsened anemia due to his recent GI bleeding.  He required blood transfusion -His platelet was less than 10 in the hospital, required a blood transfusion -His leukopenia has resolved, and his thrombocytopenia improved after he quit alcohol. -We discussed the role of TPO to improve his platelet count if he has procedure or surgery.  No needed treatment for now, we will continue monitoring.

## 2022-11-02 NOTE — Assessment & Plan Note (Deleted)
-  He initially presented with coffee-ground emesis, admitted 01/20/22. CT AP showed liver cirrhosis, splenomegaly, and a 4.6 cm liver mass. Baseline AFP was WNL. -MRI showed 4.6 cm hemorrhagic mass in segment 8, difficult to characterize by LI-RADS due to lack non rim enhancement, but nonetheless concerning for [facility] until proven otherwise -liver biopsy on 12/27/21 showed predominantly necrotic tissue, no evidence of malignancy. -His repeated liver MRI from Aug 15, 2022, which showed stable 4 cm hemorrhagic mass in segment 8, and a new 1.2 centimeter hypervascular mass in segment 4B, consistent with HCC, LI-RADS category 5.   -I recommend IR referral, for liver targeted therapy (ablation) of the small HCC, meantime biopsy of the 4 cm lesion.   -Repeated the biopsy of the large liver mass on September 03, 2022 showed necrotic tissue, no evidence of malignant cells.  This second lesion in 4B appears to be larger on ultrasound, concerning for HCC and progression. -He has not been seen by IR for his [facility] treatment, I will reach out to IR Dr. Lowella Dandy

## 2022-11-03 ENCOUNTER — Inpatient Hospital Stay: Payer: 59 | Admitting: Hematology

## 2022-11-03 ENCOUNTER — Inpatient Hospital Stay: Payer: 59 | Attending: Hematology

## 2022-11-03 DIAGNOSIS — R160 Hepatomegaly, not elsewhere classified: Secondary | ICD-10-CM

## 2022-11-03 DIAGNOSIS — D61818 Other pancytopenia: Secondary | ICD-10-CM

## 2022-11-03 DIAGNOSIS — K7030 Alcoholic cirrhosis of liver without ascites: Secondary | ICD-10-CM

## 2022-11-08 ENCOUNTER — Telehealth: Payer: Self-pay | Admitting: Hematology

## 2022-11-11 ENCOUNTER — Inpatient Hospital Stay: Payer: 59

## 2022-11-11 ENCOUNTER — Inpatient Hospital Stay: Payer: 59 | Admitting: Hematology

## 2022-11-11 ENCOUNTER — Other Ambulatory Visit: Payer: Self-pay

## 2022-11-11 NOTE — Assessment & Plan Note (Deleted)
 -  He initially presented with coffee-ground emesis, admitted 01/20/22. CT AP showed liver cirrhosis, splenomegaly, and a 4.6 cm liver mass. Baseline AFP was WNL. -MRI showed 4.6 cm hemorrhagic mass in segment 8, difficult to characterize by LI-RADS due to lack non rim enhancement, but nonetheless concerning for [facility] until proven otherwise -liver biopsy on 12/27/21 showed predominantly necrotic tissue, no evidence of malignancy. -His repeated liver MRI from Aug 15, 2022, which showed stable 4 cm hemorrhagic mass in segment 8, and a new 1.2 centimeter hypervascular mass in segment 4B, consistent with HCC, LI-RADS category 5.   -I recommend IR referral, for liver targeted therapy (ablation) of the small HCC, meantime biopsy of the 4 cm lesion.   -Repeated the biopsy of the large liver mass on September 03, 2022 showed necrotic tissue, no evidence of malignant cells.  This second lesion in 4B appears to be larger on ultrasound, concerning for HCC and progression. -He has not been seen by IR for his [facility] treatment, I will reach out to IR Dr. Philip

## 2022-11-11 NOTE — Assessment & Plan Note (Deleted)
-  he has a history of heavy alcohol consumption -cirrhosis and small liver lesions were seen on CT AP back in 07/2012 (no records prior to this). -I again encouraged him to stop drinking alcohol completely.  After his multiple hospital stay, he has decided to quit alcohol completely.  His family is helping him also. -He was evaluated on September 15, 2022 after he got drunk at a friend's party and felt and hit his head.

## 2022-11-11 NOTE — Assessment & Plan Note (Deleted)
-

## 2022-12-02 ENCOUNTER — Ambulatory Visit: Payer: 59 | Admitting: Internal Medicine

## 2022-12-08 ENCOUNTER — Other Ambulatory Visit: Payer: Self-pay | Admitting: Internal Medicine

## 2022-12-11 ENCOUNTER — Ambulatory Visit: Payer: 59 | Admitting: Internal Medicine

## 2022-12-18 ENCOUNTER — Ambulatory Visit: Payer: 59 | Admitting: Internal Medicine

## 2022-12-23 ENCOUNTER — Ambulatory Visit: Payer: 59 | Admitting: Internal Medicine

## 2022-12-28 ENCOUNTER — Other Ambulatory Visit: Payer: Self-pay | Admitting: Internal Medicine

## 2022-12-30 ENCOUNTER — Ambulatory Visit: Payer: 59 | Admitting: Internal Medicine

## 2023-01-01 ENCOUNTER — Encounter: Payer: Self-pay | Admitting: Internal Medicine

## 2023-01-01 ENCOUNTER — Ambulatory Visit: Payer: 59 | Admitting: Internal Medicine

## 2023-01-01 VITALS — BP 140/90 | HR 119 | Temp 99.1°F | Resp 20 | Ht 67.0 in | Wt 168.1 lb

## 2023-01-01 DIAGNOSIS — I10 Essential (primary) hypertension: Secondary | ICD-10-CM

## 2023-01-01 DIAGNOSIS — F10931 Alcohol use, unspecified with withdrawal delirium: Secondary | ICD-10-CM | POA: Insufficient documentation

## 2023-01-01 DIAGNOSIS — F331 Major depressive disorder, recurrent, moderate: Secondary | ICD-10-CM | POA: Diagnosis not present

## 2023-01-01 DIAGNOSIS — K766 Portal hypertension: Secondary | ICD-10-CM

## 2023-01-01 DIAGNOSIS — F419 Anxiety disorder, unspecified: Secondary | ICD-10-CM | POA: Diagnosis not present

## 2023-01-01 MED ORDER — ESCITALOPRAM OXALATE 10 MG PO TABS: 10.0000 mg | ORAL_TABLET | Freq: Every day | ORAL | 2 refills | Status: DC

## 2023-01-01 MED ORDER — MIRTAZAPINE 15 MG PO TABS: 15.0000 mg | ORAL_TABLET | Freq: Every day | ORAL | 2 refills | Status: DC

## 2023-01-01 MED ORDER — TAMSULOSIN HCL 0.4 MG PO CAPS: 0.4000 mg | ORAL_CAPSULE | Freq: Every day | ORAL | 2 refills | Status: DC

## 2023-01-01 MED ORDER — LISINOPRIL 20 MG PO TABS: 20.0000 mg | ORAL_TABLET | Freq: Every day | ORAL | 2 refills | Status: DC

## 2023-01-01 NOTE — Progress Notes (Addendum)
Office Visit  Subjective   Patient ID: [name]   DOB: Feb 04, 1971   Age: 52 y.o.   MRN: [id number]   Chief Complaint Chief Complaint  Patient presents with   Follow-up    Having trouble sleeping and want something for anxiety.     History of Present Illness 52 years old male who is here for follow-up.  He says that he cannot sleep at nighttime.  He has a history of alcohol abuse and says that he will not drink anymore.  He has been to hospital in and out because of alcohol intoxication.  He says that his wife take Xanax and that is helping him so he can sleep and he want me to prescribe to him.  I have discussed with him that I cannot prescribe him and he need to go to [facility] so he can get help with alcohol abuse and insomnia.  He does not wanted to go there.  He says that his he does not have insurance His heart rate is high and blood pressure is also elevated.  But he is anxious.  He used to take Lexapro 10 mg but ran out of it few months ago.  He lives with his wife and he came with his mother.  He does not have any suicidal ideation.  Past Medical History Past Medical History:  Diagnosis Date   Alcoholic (HCC)    Anxiety    Diabetes (HCC)    DVT (deep venous thrombosis) (HCC)    Hypertension      Allergies No Known Allergies   Review of Systems Review of Systems  Respiratory: Negative.    Cardiovascular: Negative.   Gastrointestinal: Negative.   Psychiatric/Behavioral:  The patient is nervous/anxious and has insomnia.        Objective:    Vitals BP (!) 140/90 (BP Location: Left Arm, Patient Position: Sitting, Cuff Size: Normal)   Pulse (!) 119   Temp 99.1 F (37.3 C)   Resp 20   Ht 5\' 7"  (1.702 m)   Wt 168 lb 2 oz (76.3 kg)   SpO2 95%   BMI 26.33 kg/m    Physical Examination Physical Exam Constitutional:      Appearance: Normal appearance.  HENT:     Head: Normocephalic and atraumatic.  Cardiovascular:     Rate and Rhythm: Regular rhythm.  Tachycardia present.     Heart sounds: Normal heart sounds.  Pulmonary:     Effort: Pulmonary effort is normal.     Breath sounds: Normal breath sounds.  Neurological:     General: No focal deficit present.     Mental Status: He is alert and oriented to person, place, and time.        Assessment & Plan:   Essential hypertension His blood pressure is upper limit of normal but he has a tachycardia.  I will add beta-blocker and continue with the lisinopril 20 mg daily.  Anxiety I will add Remeron 15 mg at nighttime and discussed with him that I cannot prescribe Xanax because of his alcohol abuse.  He need to follow-up with psychiatrist.  Major depressive disorder I will restart with Lexapro 10 mg daily. He will bring his blood sugar record and he will also bring all medication that he take on next visit.  He will call if he has any suicidal ideation.  Return in about 2 weeks (around 01/15/2023).   Eloisa Northern, MD

## 2023-01-02 NOTE — Assessment & Plan Note (Signed)
I will add Remeron 15 mg at nighttime and discussed with him that I cannot prescribe Xanax because of his alcohol abuse.  He need to follow-up with psychiatrist.

## 2023-01-02 NOTE — Assessment & Plan Note (Signed)
His blood pressure is upper limit of normal but he has a tachycardia.  I will add beta-blocker and continue with the lisinopril 20 mg daily.

## 2023-01-02 NOTE — Assessment & Plan Note (Signed)
I will restart with Lexapro 10 mg daily.

## 2023-01-15 ENCOUNTER — Ambulatory Visit: Payer: 59 | Admitting: Internal Medicine

## 2023-02-17 ENCOUNTER — Other Ambulatory Visit: Payer: Self-pay

## 2023-02-23 ENCOUNTER — Other Ambulatory Visit: Payer: Self-pay

## 2023-03-19 ENCOUNTER — Ambulatory Visit: Payer: 59 | Admitting: Internal Medicine

## 2023-03-19 ENCOUNTER — Other Ambulatory Visit: Payer: Self-pay | Admitting: Internal Medicine

## 2023-03-19 VITALS — BP 130/80 | HR 99 | Temp 98.8°F | Resp 18 | Ht 67.0 in | Wt 168.0 lb

## 2023-03-19 DIAGNOSIS — E118 Type 2 diabetes mellitus with unspecified complications: Secondary | ICD-10-CM

## 2023-03-19 DIAGNOSIS — Z794 Long term (current) use of insulin: Secondary | ICD-10-CM | POA: Diagnosis not present

## 2023-03-19 DIAGNOSIS — F1010 Alcohol abuse, uncomplicated: Secondary | ICD-10-CM | POA: Diagnosis not present

## 2023-03-19 DIAGNOSIS — F331 Major depressive disorder, recurrent, moderate: Secondary | ICD-10-CM | POA: Diagnosis not present

## 2023-03-19 DIAGNOSIS — E119 Type 2 diabetes mellitus without complications: Secondary | ICD-10-CM | POA: Diagnosis not present

## 2023-03-19 DIAGNOSIS — D696 Thrombocytopenia, unspecified: Secondary | ICD-10-CM | POA: Diagnosis not present

## 2023-03-19 MED ORDER — NALTREXONE HCL 50 MG PO TABS: 50.0000 mg | ORAL_TABLET | Freq: Every day | ORAL | 1 refills | Status: DC

## 2023-03-19 MED ORDER — ESCITALOPRAM OXALATE 20 MG PO TABS: 20.0000 mg | ORAL_TABLET | Freq: Every day | ORAL | 3 refills | Status: AC

## 2023-03-19 MED ORDER — FREESTYLE LIBRE 3 READER DEVI: 3 refills | Status: AC

## 2023-03-19 MED ORDER — FREESTYLE LIBRE 3 SENSOR MISC: 6 refills | Status: DC

## 2023-03-19 NOTE — Assessment & Plan Note (Signed)
I will do CBC and monitor

## 2023-03-19 NOTE — Assessment & Plan Note (Signed)
I will do CMP and hemoglobin A1c.  He will use continuous glucose monitor to avoid hypoglycemia.

## 2023-03-19 NOTE — Progress Notes (Addendum)
   Office Visit  Subjective   Patient ID: [name]   DOB: [birth year]   Age: 52 y.o.   MRN: [id number]   Chief Complaint Chief Complaint  Patient presents with   Office visit    Wants to quit drinking.     History of Present Illness 52 years old male is here with his wife, he says that he want to quit drinking.  He is trying to to cut down drinking but is not successful.  He went to [facility] but does not wanted to go there.  Per wife he is trying to cut down and now drinks 12 beers per day. He does not want therapy or go to [facility]. He drank this morning. He is crying and wanted some help.  I have discussed with him the option of inpatient versus outpatient rehab. He also has a history of diabetes mellitus and take Lantus 10 unit daily but does not check his sugar.  No hypoglycemia per patient.  He also has a history of cirrhosis of liver and thrombocytopenia and I have not done any recent labs.  Past Medical History Past Medical History:  Diagnosis Date   Alcoholic (HCC)    Anxiety    Diabetes (HCC)    DVT (deep venous thrombosis) (HCC)    Hypertension      Allergies No Known Allergies   Review of Systems Review of Systems  Constitutional: Negative.   Respiratory: Negative.    Cardiovascular: Negative.   Neurological: Negative.        Objective:    Vitals BP 130/80 (BP Location: Left Arm, Patient Position: Sitting, Cuff Size: Normal)   Pulse 99   Temp 98.8 F (37.1 C)   Resp 18   Ht 5\' 7"  (1.702 m)   Wt 168 lb (76.2 kg)   SpO2 97%   BMI 26.31 kg/m    Physical Examination Physical Exam Constitutional:      Appearance: Normal appearance.  HENT:     Head: Normocephalic and atraumatic.  Cardiovascular:     Rate and Rhythm: Normal rate and regular rhythm.     Heart sounds: Normal heart sounds.  Pulmonary:     Effort: Pulmonary effort is normal.     Breath sounds: Normal breath sounds.  Neurological:     General: No focal deficit present.      Mental Status: He is alert and oriented to person, place, and time.        Assessment & Plan:   DM (diabetes mellitus), type 2 with complications (HCC) I will do CMP and hemoglobin A1c.  He will use continuous glucose monitor to avoid hypoglycemia.  Thrombocytopenia (HCC) I will do CBC and monitor  Alcohol abuse I have advised him to get help from [facility] or other places and start him on naltrexone 25 mg daily for 2 days then 50 mg daily.  His wife will call helpline and try to get some help for him.    Return in about 2 weeks (around 04/02/2023).   Eloisa Northern, MD

## 2023-03-19 NOTE — Addendum Note (Signed)
Addended byEloisa Northern on: 03/19/2023 11:05 AM   Modules accepted: Orders

## 2023-03-19 NOTE — Assessment & Plan Note (Signed)
I have advised him to get help from [facility] or other places and start him on naltrexone 25 mg daily for 2 days then 50 mg daily.  His wife will call helpline and try to get some help for him.

## 2023-03-20 LAB — CMP14 + ANION GAP
ALT: 42 [IU]/L (ref 0–44)
AST: 81 [IU]/L — ABNORMAL HIGH (ref 0–40)
Albumin: 4.1 g/dL (ref 3.8–4.9)
Alkaline Phosphatase: 114 [IU]/L (ref 44–121)
Anion Gap: 17 mmol/L (ref 10.0–18.0)
BUN/Creatinine Ratio: 8 — ABNORMAL LOW (ref 9–20)
BUN: 4 mg/dL — ABNORMAL LOW (ref 6–24)
Bilirubin Total: 1.2 mg/dL (ref 0.0–1.2)
CO2: 23 mmol/L (ref 20–29)
Calcium: 8.1 mg/dL — ABNORMAL LOW (ref 8.7–10.2)
Chloride: 99 mmol/L (ref 96–106)
Creatinine, Ser: 0.53 mg/dL — ABNORMAL LOW (ref 0.76–1.27)
Globulin, Total: 2.1 g/dL (ref 1.5–4.5)
Glucose: 214 mg/dL — ABNORMAL HIGH (ref 70–99)
Potassium: 4 mmol/L (ref 3.5–5.2)
Sodium: 139 mmol/L (ref 134–144)
Total Protein: 6.2 g/dL (ref 6.0–8.5)
eGFR: 119 mL/min/{1.73_m2} (ref >59)

## 2023-03-20 LAB — HEMOGLOBIN A1C
Est. average glucose Bld gHb Est-mCnc: 151 mg/dL
Hgb A1c MFr Bld: 6.9 % — ABNORMAL HIGH (ref 4.8–5.6)

## 2023-03-20 LAB — CBC WITH DIFFERENTIAL/PLATELET
Basophils Absolute: 0 10*3/uL (ref 0.0–0.2)
Basos: 1 %
EOS (ABSOLUTE): 0 10*3/uL (ref 0.0–0.4)
Eos: 1 %
Hematocrit: 30.9 % — ABNORMAL LOW (ref 37.5–51.0)
Hemoglobin: 8.8 g/dL — ABNORMAL LOW (ref 13.0–17.7)
Immature Grans (Abs): 0 10*3/uL (ref 0.0–0.1)
Immature Granulocytes: 1 %
Lymphocytes Absolute: 0.3 10*3/uL — ABNORMAL LOW (ref 0.7–3.1)
Lymphs: 15 %
MCH: 21.6 pg — ABNORMAL LOW (ref 26.6–33.0)
MCHC: 28.5 g/dL — ABNORMAL LOW (ref 31.5–35.7)
MCV: 76 fL — ABNORMAL LOW (ref 79–97)
Monocytes Absolute: 0.2 10*3/uL (ref 0.1–0.9)
Monocytes: 10 %
Neutrophils Absolute: 1.5 10*3/uL (ref 1.4–7.0)
Neutrophils: 72 %
Platelets: 20 10*3/uL — CL (ref 150–450)
RBC: 4.08 x10E6/uL — ABNORMAL LOW (ref 4.14–5.80)
RDW: 17.1 % — ABNORMAL HIGH (ref 11.6–15.4)
WBC: 2.1 10*3/uL — CL (ref 3.4–10.8)

## 2023-03-26 ENCOUNTER — Telehealth: Payer: Self-pay

## 2023-03-26 ENCOUNTER — Other Ambulatory Visit: Payer: Self-pay

## 2023-03-26 NOTE — Telephone Encounter (Signed)
 Message given to dr Nelson Chimes thanks

## 2023-04-02 ENCOUNTER — Encounter: Payer: Self-pay | Admitting: Internal Medicine

## 2023-04-02 ENCOUNTER — Ambulatory Visit: Payer: 59 | Admitting: Internal Medicine

## 2023-04-02 ENCOUNTER — Other Ambulatory Visit: Payer: Self-pay | Admitting: Internal Medicine

## 2023-04-02 VITALS — BP 124/70 | HR 97 | Temp 98.4°F | Resp 18 | Ht 67.0 in | Wt 159.2 lb

## 2023-04-02 DIAGNOSIS — D61818 Other pancytopenia: Secondary | ICD-10-CM

## 2023-04-02 DIAGNOSIS — N401 Enlarged prostate with lower urinary tract symptoms: Secondary | ICD-10-CM

## 2023-04-02 DIAGNOSIS — R3916 Straining to void: Secondary | ICD-10-CM

## 2023-04-02 DIAGNOSIS — F1010 Alcohol abuse, uncomplicated: Secondary | ICD-10-CM | POA: Diagnosis not present

## 2023-04-02 DIAGNOSIS — E118 Type 2 diabetes mellitus with unspecified complications: Secondary | ICD-10-CM

## 2023-04-02 DIAGNOSIS — F419 Anxiety disorder, unspecified: Secondary | ICD-10-CM | POA: Diagnosis not present

## 2023-04-02 DIAGNOSIS — F331 Major depressive disorder, recurrent, moderate: Secondary | ICD-10-CM | POA: Diagnosis not present

## 2023-04-02 DIAGNOSIS — K7030 Alcoholic cirrhosis of liver without ascites: Secondary | ICD-10-CM | POA: Diagnosis not present

## 2023-04-02 DIAGNOSIS — I10 Essential (primary) hypertension: Secondary | ICD-10-CM | POA: Diagnosis not present

## 2023-04-02 MED ORDER — DEXCOM G7 SENSOR MISC: 6 refills | Status: AC

## 2023-04-02 MED ORDER — NALTREXONE HCL 50 MG PO TABS: 50.0000 mg | ORAL_TABLET | Freq: Every day | ORAL | 1 refills | Status: DC

## 2023-04-02 MED ORDER — INSULIN GLARGINE 100 UNIT/ML SOLOSTAR PEN: 10.0000 [IU] | PEN_INJECTOR | Freq: Every day | SUBCUTANEOUS | 6 refills | Status: DC

## 2023-04-02 NOTE — Assessment & Plan Note (Signed)
 His hemoglobin A1c was 6.9%, he will take Lantus 10 unit daily.  I will send Dexcom G7 so he can monitor his blood sugar.

## 2023-04-02 NOTE — Assessment & Plan Note (Signed)
 He will continue with naltrexone.

## 2023-04-02 NOTE — Assessment & Plan Note (Signed)
Blood Pressure is well controlled.

## 2023-04-02 NOTE — Assessment & Plan Note (Signed)
 He has cirrhosis of liver without ascites probably the cause for pancytopenia.  He says that he has stopped drinking altogether.

## 2023-04-02 NOTE — Assessment & Plan Note (Signed)
 He will follow-up with [facility] for anxiety, depression and insomnia.

## 2023-04-02 NOTE — Progress Notes (Addendum)
   Office Visit  Subjective   Patient ID: [name]   DOB: 1971-02-12   Age: 53 y.o.   MRN: [id number]   Chief Complaint Chief Complaint  Patient presents with   Follow-up    2 WEEK FOLLOW UP     History of Present Illness 53 years old male is here for follow-up.  He says that initially to 3 days he felt very nauseous but after that he is much better now.  He take naltrexone  1 tablets daily.  He quit drinking altogether.  I have suggested him to go to [facility] but he says that he never went there. 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  He also has pancytopenia where his WBC was 2.1, hemoglobin 8.8 and platelet count was 20.  I have referred him to see hematology.  He says that he received 2 phone call from them but they did not give any appointment.  He denies any bleeding. He also has a history of diabetes mellitus and he takes Lantus  10 unit daily.  No hypoglycemia.  He says that he never received continuous glucose monitor prescription.  His hemoglobin A1c was 6.9% 2 weeks ago. He has a history of anxiety, depression and insomnia.  He is asking for Remeron  dose to be increased, he also take Lexapro  20 mg daily.  I have discussed with him that he has cirrhosis of liver and this may be a higher dose for him and he need to follow-up with [facility].  Past Medical History Past Medical History:  Diagnosis Date   Alcoholic (HCC)    Anxiety    Diabetes (HCC)    DVT (deep venous thrombosis) (HCC)    Hypertension      Allergies No Known Allergies   Review of Systems Review of Systems  Constitutional: Negative.   Respiratory: Negative.    Cardiovascular: Negative.   Gastrointestinal: Negative.   Neurological: Negative.        Objective:    Vitals BP 124/70 (BP Location: Left Arm, Patient Position: Sitting, Cuff Size: Normal)   Pulse 97   Temp 98.4 F (36.9 C)   Resp 18   Ht 5' 7 (1.702 m)   Wt 159 lb 4 oz (72.2 kg)   SpO2 99%   BMI 24.94  kg/m    Physical Examination Physical Exam Constitutional:      Appearance: Normal appearance.  Cardiovascular:     Heart sounds: Normal heart sounds.  Abdominal:     General: Bowel sounds are normal.     Palpations: Abdomen is soft.  Neurological:     General: No focal deficit present.     Mental Status: He is alert and oriented to person, place, and time.        Assessment & Plan:   Essential hypertension Blood Pressure is well-controlled.  Alcoholic cirrhosis of liver without ascites (HCC) He has cirrhosis of liver without ascites probably the cause for pancytopenia.  He says that he has stopped drinking altogether.  DM (diabetes mellitus), type 2 with complications (HCC) His hemoglobin A1c was 6.9%, he will take Lantus  10 unit daily.  I will send Dexcom G7 so he can monitor his blood sugar.  Anxiety He will follow-up with [facility] for anxiety, depression and insomnia.  Benign prostatic hyperplasia (BPH) with straining on urination He will continue with Flomax .    Return in about 1 month (around 05/03/2023).   Roetta Dare, MD

## 2023-04-02 NOTE — Assessment & Plan Note (Signed)
 He will continue with Flomax.

## 2023-04-03 ENCOUNTER — Telehealth: Payer: Self-pay | Admitting: Hematology

## 2023-04-03 LAB — CBC WITH DIFFERENTIAL/PLATELET
Basophils Absolute: 0 x10E3/uL (ref 0.0–0.2)
Basos: 2 %
EOS (ABSOLUTE): 0 x10E3/uL (ref 0.0–0.4)
Eos: 1 %
Hematocrit: 32.2 % — ABNORMAL LOW (ref 37.5–51.0)
Hemoglobin: 9.6 g/dL — ABNORMAL LOW (ref 13.0–17.7)
Immature Grans (Abs): 0 x10E3/uL (ref 0.0–0.1)
Immature Granulocytes: 0 %
Lymphocytes Absolute: 0.3 x10E3/uL — ABNORMAL LOW (ref 0.7–3.1)
Lymphs: 12 %
MCH: 22.2 pg — ABNORMAL LOW (ref 26.6–33.0)
MCHC: 29.8 g/dL — ABNORMAL LOW (ref 31.5–35.7)
MCV: 75 fL — ABNORMAL LOW (ref 79–97)
Monocytes Absolute: 0.3 x10E3/uL (ref 0.1–0.9)
Monocytes: 10 %
Neutrophils Absolute: 2 x10E3/uL (ref 1.4–7.0)
Neutrophils: 75 %
Platelets: 63 x10E3/uL — CL (ref 150–450)
RBC: 4.32 x10E6/uL (ref 4.14–5.80)
RDW: 16.5 % — ABNORMAL HIGH (ref 11.6–15.4)
WBC: 2.6 x10E3/uL — ABNORMAL LOW (ref 3.4–10.8)

## 2023-04-03 NOTE — Telephone Encounter (Signed)
 Called patient twice and left a voicemail in regards to scheduled appointment times/dates; left callback number for scheduling

## 2023-04-04 ENCOUNTER — Other Ambulatory Visit: Payer: Self-pay | Admitting: Internal Medicine

## 2023-04-13 NOTE — Assessment & Plan Note (Signed)
-  presented with coffee-ground emesis, admitted 01/20/22. CT AP showed liver cirrhosis, splenomegaly, and a 4.6 cm liver mass. Baseline AFP was WNL. -MRI showed 4.6 cm hemorrhagic mass in segment 8, difficult to characterize by LI-RADS due to lack non rim enhancement, but nonetheless concerning for [facility] until proven otherwise -liver biopsy on 12/27/21 showed predominantly necrotic tissue, no evidence of malignancy. -CT scan from May 22, 2022 showed stable liver mass -- liver MRI from Aug 15, 2022 showed stable 4 cm hemorrhagic mass in segment 8, and a new 1.2 centimeter hypervascular mass in segment 4B, consistent with HCC, LI-RADS category 5.   -I recommended IR referral, for liver targeted therapy (ablation) of the small HCC, meantime biopsy of the 4 cm lesion. Biopsy showed necrotic tissue, he has not had ablation, will refer him to IR again

## 2023-04-13 NOTE — Assessment & Plan Note (Signed)
-  secondary to splenomegaly and alcohol abuse -Worsened anemia due to his recent GI bleeding.  He required blood transfusion -His platelet was less than 10 in the hospital, required a blood transfusion -Platelets 31 today, is recovering since that he has stopped alcohol. -We discussed the role of TPO to improve his platelet count if he has procedure or surgery.  No needed treatment for now, we will continue monitoring.

## 2023-04-14 ENCOUNTER — Other Ambulatory Visit: Payer: Self-pay

## 2023-04-14 ENCOUNTER — Inpatient Hospital Stay: Payer: 59 | Attending: Hematology

## 2023-04-14 ENCOUNTER — Inpatient Hospital Stay (HOSPITAL_BASED_OUTPATIENT_CLINIC_OR_DEPARTMENT_OTHER): Payer: 59 | Admitting: Hematology

## 2023-04-14 DIAGNOSIS — R160 Hepatomegaly, not elsewhere classified: Secondary | ICD-10-CM

## 2023-04-14 DIAGNOSIS — D61818 Other pancytopenia: Secondary | ICD-10-CM

## 2023-04-30 ENCOUNTER — Ambulatory Visit: Payer: 59 | Admitting: Internal Medicine

## 2023-05-05 ENCOUNTER — Other Ambulatory Visit: Payer: Self-pay | Admitting: Internal Medicine

## 2023-05-14 NOTE — Progress Notes (Signed)
 err

## 2023-07-18 ENCOUNTER — Other Ambulatory Visit: Payer: Self-pay | Admitting: Internal Medicine

## 2023-07-24 ENCOUNTER — Ambulatory Visit: Admitting: Student

## 2023-07-29 ENCOUNTER — Other Ambulatory Visit: Payer: Self-pay

## 2023-08-04 ENCOUNTER — Ambulatory Visit: Admitting: Internal Medicine

## 2023-08-11 ENCOUNTER — Ambulatory Visit: Payer: Self-pay | Admitting: Internal Medicine

## 2023-08-12 ENCOUNTER — Ambulatory Visit: Payer: Self-pay | Admitting: Internal Medicine

## 2023-08-26 ENCOUNTER — Ambulatory Visit: Payer: Self-pay | Admitting: Internal Medicine

## 2023-09-01 ENCOUNTER — Ambulatory Visit: Payer: Self-pay | Admitting: Internal Medicine

## 2023-09-01 ENCOUNTER — Encounter: Payer: Self-pay | Admitting: Internal Medicine

## 2023-09-01 VITALS — BP 120/70 | HR 95 | Temp 99.0°F | Resp 18 | Ht 67.0 in | Wt 167.0 lb

## 2023-09-01 DIAGNOSIS — F1010 Alcohol abuse, uncomplicated: Secondary | ICD-10-CM

## 2023-09-01 DIAGNOSIS — E118 Type 2 diabetes mellitus with unspecified complications: Secondary | ICD-10-CM

## 2023-09-01 DIAGNOSIS — K7031 Alcoholic cirrhosis of liver with ascites: Secondary | ICD-10-CM | POA: Insufficient documentation

## 2023-09-01 DIAGNOSIS — D500 Iron deficiency anemia secondary to blood loss (chronic): Secondary | ICD-10-CM | POA: Insufficient documentation

## 2023-09-01 DIAGNOSIS — I10 Essential (primary) hypertension: Secondary | ICD-10-CM

## 2023-09-01 MED ORDER — CHLORDIAZEPOXIDE HCL 25 MG PO CAPS: 25.0000 mg | ORAL_CAPSULE | Freq: Three times a day (TID) | ORAL | 0 refills | Status: DC | PRN

## 2023-09-01 NOTE — Progress Notes (Unsigned)
   Office Visit  Subjective   Patient ID: [name]   DOB: 1970/12/17   Age: 53 y.o.   MRN: [id number]   Chief Complaint Chief Complaint  Patient presents with   Office visit     History of Present Illness  53 years old male is here for follow-up.  He says that initially to 3 days he felt very nauseous but after that he is much better now.  He take naltrexone  1 tablets daily.  He quit drinking altogether.  I have suggested him to go to [facility] but he says that he never went there. 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  He also has pancytopenia where his WBC was 2.1, hemoglobin 8.8 and platelet count was 20.  I have referred him to see hematology.  He says that he received 2 phone call from them but they did not give any appointment.  He denies any bleeding. He also has a history of diabetes mellitus and he takes Lantus  10 unit daily.  No hypoglycemia.  He says that he never received continuous glucose monitor prescription.  His hemoglobin A1c was 6.9% 2 weeks ago. He has a history of anxiety, depression and insomnia.  He is asking for Remeron  dose to be increased, he also take Lexapro  20 mg daily.  I have discussed with him that he has cirrhosis of liver and this may be a higher dose for him and he need to follow-up with [facility]. Past Medical History Past Medical History:  Diagnosis Date   Alcoholic (HCC)    Anxiety    Diabetes (HCC)    DVT (deep venous thrombosis) (HCC)    Hypertension      Allergies No Known Allergies   Review of Systems ROS     Objective:    Vitals BP 120/70 (BP Location: Left Arm, Patient Position: Sitting, Cuff Size: Normal)   Pulse 95   Temp 99 F (37.2 C)   Resp 18   Ht 5\' 7"  (1.702 m)   Wt 167 lb (75.8 kg)   SpO2 96%   BMI 26.16 kg/m    Physical Examination Physical Exam     Assessment & Plan:   No problem-specific Assessment & Plan notes found for this encounter.    Return in about 3 years (around  09/01/2026).   Tita Form, MD

## 2023-09-02 ENCOUNTER — Ambulatory Visit: Payer: Self-pay

## 2023-09-02 LAB — CMP14 + ANION GAP
ALT: 47 IU/L — ABNORMAL HIGH (ref 0–44)
AST: 87 IU/L — ABNORMAL HIGH (ref 0–40)
Albumin: 3.6 g/dL — ABNORMAL LOW (ref 3.8–4.9)
Alkaline Phosphatase: 130 IU/L — ABNORMAL HIGH (ref 44–121)
Anion Gap: 15 mmol/L (ref 10.0–18.0)
BUN/Creatinine Ratio: 10 (ref 9–20)
BUN: 6 mg/dL (ref 6–24)
Bilirubin Total: 2.1 mg/dL — ABNORMAL HIGH (ref 0.0–1.2)
CO2: 20 mmol/L (ref 20–29)
Calcium: 8.3 mg/dL — ABNORMAL LOW (ref 8.7–10.2)
Chloride: 105 mmol/L (ref 96–106)
Creatinine, Ser: 0.63 mg/dL — ABNORMAL LOW (ref 0.76–1.27)
Globulin, Total: 2.3 g/dL (ref 1.5–4.5)
Glucose: 151 mg/dL — ABNORMAL HIGH (ref 70–99)
Potassium: 4.3 mmol/L (ref 3.5–5.2)
Sodium: 140 mmol/L (ref 134–144)
Total Protein: 5.9 g/dL — ABNORMAL LOW (ref 6.0–8.5)
eGFR: 113 mL/min/{1.73_m2} (ref >59)

## 2023-09-02 LAB — HEMOGLOBIN A1C
Est. average glucose Bld gHb Est-mCnc: 97 mg/dL
Hgb A1c MFr Bld: 5 % (ref 4.8–5.6)

## 2023-09-02 LAB — CBC WITH DIFFERENTIAL/PLATELET
Basophils Absolute: 0.1 10*3/uL (ref 0.0–0.2)
Basos: 2 %
EOS (ABSOLUTE): 0.1 10*3/uL (ref 0.0–0.4)
Eos: 4 %
Hematocrit: 26.2 % — ABNORMAL LOW (ref 37.5–51.0)
Hemoglobin: 7.6 g/dL — ABNORMAL LOW (ref 13.0–17.7)
Immature Grans (Abs): 0 10*3/uL (ref 0.0–0.1)
Immature Granulocytes: 0 %
Lymphocytes Absolute: 0.6 10*3/uL — ABNORMAL LOW (ref 0.7–3.1)
Lymphs: 18 %
MCH: 23.8 pg — ABNORMAL LOW (ref 26.6–33.0)
MCHC: 29 g/dL — ABNORMAL LOW (ref 31.5–35.7)
MCV: 82 fL (ref 79–97)
Monocytes Absolute: 0.5 10*3/uL (ref 0.1–0.9)
Monocytes: 16 %
Neutrophils Absolute: 2 10*3/uL (ref 1.4–7.0)
Neutrophils: 60 %
Platelets: 38 10*3/uL — CL (ref 150–450)
RBC: 3.2 x10E6/uL — ABNORMAL LOW (ref 4.14–5.80)
RDW: 16.9 % — ABNORMAL HIGH (ref 11.6–15.4)
WBC: 3.3 10*3/uL — ABNORMAL LOW (ref 3.4–10.8)

## 2023-09-02 NOTE — Assessment & Plan Note (Signed)
I will do hemoglobin A1c today.

## 2023-09-02 NOTE — Assessment & Plan Note (Signed)
 I have discussed with him I will start him on Librium  25 mg 3 times a day to prevent withdrawal. He understands the risk and he tells me that he will not drink.  His mother will also make sure that his wife monitor him that he should not drink

## 2023-09-02 NOTE — Assessment & Plan Note (Signed)
 I will do labs today and will start him on Lasix  and spironolactone  for ascites.  He need to follow up with gastroenterologist.

## 2023-09-02 NOTE — Assessment & Plan Note (Signed)
 His blood pressure is controlled.  He will continue with lisinopril  20 mg daily.

## 2023-09-02 NOTE — Assessment & Plan Note (Signed)
I will do CBC

## 2023-09-04 ENCOUNTER — Ambulatory Visit: Admitting: Internal Medicine

## 2023-09-04 ENCOUNTER — Encounter: Payer: Self-pay | Admitting: Internal Medicine

## 2023-09-04 VITALS — BP 122/80 | HR 92 | Temp 99.2°F | Resp 18 | Ht 67.0 in | Wt 161.1 lb

## 2023-09-04 DIAGNOSIS — F419 Anxiety disorder, unspecified: Secondary | ICD-10-CM

## 2023-09-04 DIAGNOSIS — E118 Type 2 diabetes mellitus with unspecified complications: Secondary | ICD-10-CM | POA: Diagnosis not present

## 2023-09-04 DIAGNOSIS — D696 Thrombocytopenia, unspecified: Secondary | ICD-10-CM | POA: Diagnosis not present

## 2023-09-04 DIAGNOSIS — D61818 Other pancytopenia: Secondary | ICD-10-CM

## 2023-09-04 DIAGNOSIS — F1010 Alcohol abuse, uncomplicated: Secondary | ICD-10-CM

## 2023-09-04 NOTE — Progress Notes (Unsigned)
   Office Visit  Subjective   Patient ID: [name]   DOB: 1971/02/05   Age: 53 y.o.   MRN: [id number]   Chief Complaint Chief Complaint  Patient presents with   Follow-up    Diabetes Mellitus     History of Present Illness 53 years old male is here for follow-up.   He says that he has not slept in 3 days and wanted something to be given so he can sleep.  He still drink alcohol.  He drank this morning.  He drinks 6-7 drinks daily.  His mother is with him.  He promises in front of his mother that he wanted to quit and if I give him diazepam  or lorazepam  he promises that he will not drink anymore.  He understands the risks that he will die if eat drink and take medicine together.     He has a history of alcoholic cirrhosis with ascites.  He says that he has swelling in his leg and abdomen fluid has increased in size.  He also has a mass in his liver where the biopsy 2 years ago was negative but per gastroenterologist's note this is a hepatocellular carcinoma until proven otherwise.  He has not seen gastroenterologist.     He also has a thrombocytopenia from cirrhosis of liver with ascites and has anemia.  His last blood draw was January this year.   He is pale looking.      He also has diabetes mellitus and he take Lantus  10 units daily.   He does not check his sugar.  He also take mealtime coverage.  He denies any hypoglycemia.     He has hypertension is blood pressure is controlled.   He has a history of anxiety, depression and insomnia.  He is asking for Remeron  dose to be increased, he also take Lexapro  20 mg daily.  I have discussed with him that he has cirrhosis of liver and this may be a higher dose for him and he need to follow-up with [facility].  Past Medical History Past Medical History:  Diagnosis Date   Alcoholic (HCC)    Anxiety    Diabetes (HCC)    DVT (deep venous thrombosis) (HCC)    Hypertension      Allergies No Known Allergies   Review of Systems ROS      Objective:    Vitals BP 122/80 (BP Location: Left Arm, Patient Position: Sitting, Cuff Size: Normal)   Pulse 92   Temp 99.2 F (37.3 C)   Resp 18   Ht 5' 7 (1.702 m)   Wt 161 lb 2 oz (73.1 kg)   SpO2 96%   BMI 25.24 kg/m    Physical Examination Physical Exam     Assessment & Plan:   No problem-specific Assessment & Plan notes found for this encounter.    Return in about 1 month (around 10/04/2023).   Tita Form, MD

## 2023-09-05 MED ORDER — FUROSEMIDE 20 MG PO TABS: 20.0000 mg | ORAL_TABLET | Freq: Every day | ORAL | 11 refills | Status: AC

## 2023-09-05 MED ORDER — MIRTAZAPINE 15 MG PO TABS: 15.0000 mg | ORAL_TABLET | Freq: Every day | ORAL | 3 refills | Status: DC

## 2023-09-05 MED ORDER — LORAZEPAM 1 MG PO TABS: 1.0000 mg | ORAL_TABLET | Freq: Three times a day (TID) | ORAL | 0 refills | Status: AC | PRN

## 2023-09-05 MED ORDER — NALTREXONE HCL 50 MG PO TABS: 50.0000 mg | ORAL_TABLET | Freq: Every day | ORAL | 0 refills | Status: AC

## 2023-09-05 MED ORDER — SPIRONOLACTONE 25 MG PO TABS: 25.0000 mg | ORAL_TABLET | Freq: Every day | ORAL | 11 refills | Status: AC

## 2023-09-05 NOTE — Assessment & Plan Note (Signed)
 He did not drink alcohol in 3 days.  I am worried that he may have withdrawal.  I will start him on lorazepam  1 mg 3 times a day p.r.n. to prevent withdrawal.  He will not drink and he understand the risk and his wife also tell me that she will make sure that he does not drink and take lorazepam .  I will also start him on naltrexone  as he wanted to quit drinking.

## 2023-09-05 NOTE — Assessment & Plan Note (Signed)
 His wife will make sure that he follow-up with Hematologist at [facility].

## 2023-09-05 NOTE — Assessment & Plan Note (Signed)
 Because his hemoglobin A1c was 5.0 he will stop Lantus  and will continue to monitor blood sugar.

## 2023-09-05 NOTE — Assessment & Plan Note (Signed)
 I will start him on mirtazapine  15 mg at bedtime.

## 2023-09-14 ENCOUNTER — Telehealth: Payer: Self-pay | Admitting: Hematology

## 2023-09-14 NOTE — Telephone Encounter (Signed)
 Scheduled appointments per patients request via incoming call. Talked with the patients mother and she is aware of the made appointments for the patient.

## 2023-09-17 ENCOUNTER — Other Ambulatory Visit: Payer: Self-pay | Admitting: Nurse Practitioner

## 2023-09-17 DIAGNOSIS — D649 Anemia, unspecified: Secondary | ICD-10-CM

## 2023-09-17 DIAGNOSIS — D61818 Other pancytopenia: Secondary | ICD-10-CM

## 2023-09-17 NOTE — Progress Notes (Addendum)
 Patient Care Team: Amin, Saad, MD as PCP - General (Internal Medicine) Lanny Callander, MD as Attending Physician (Hematology and Oncology)  Clinic Day:  09/18/2023  Referring physician: Caleen Dirks, MD  ASSESSMENT & PLAN:   Assessment & Plan: Liver mass -presented with coffee-ground emesis, admitted 12/24/21. CT AP showed liver cirrhosis, splenomegaly, and a 4.6 cm liver mass. Baseline AFP was WNL. -MRI showed 4.6 cm hemorrhagic mass in segment 8, difficult to characterize by LI-RADS due to lack non rim enhancement, but nonetheless concerning for [facility] until proven otherwise -liver biopsy on 12/27/21 showed predominantly necrotic tissue, no evidence of malignancy. -CT scan from May 22, 2022 showed stable liver mass -- liver MRI from Aug 15, 2022 showed stable 4 cm hemorrhagic mass in segment 8, and a new 1.2 centimeter hypervascular mass in segment 4B, consistent with HCC, LI-RADS category 5.   -Biopsy showed necrotic tissue, he has not had ablation. Was referred to IR to have ablative procedure multiple times and never went.  - 09/18/2023 -new abdominal MRI with and without contrast will be ordered for further evaluation of liver lesions.  Will obtain new IR referral as indicated.  Will refer to GI as well.    Pancytopenia with severe anemia Today, Hgb 7.5 with Hct 26.2. platelet count 42. Blood bank hold obtained with routine labs today. Willl arrange for 1 unit RBCs, possibly tomorrow. The patient has been advised to keep his blood bank bracelet on through his visit tomorrow. Waiting for ferritin results.  Will arrange for IV iron as needed based on ferritin results.  Refer to GI for endoscopy and colonoscopy for evaluation for source of bleeding.  Liver mass Liver biopsy from 08/2022 showed necrotic tissue without definitive evidence of malignancy.  He was referred to IR and to GI for further evaluation and treatment.  Patient states he never did go for these appointments.  States he was never  made aware of official appointment times.  Will get new MRI with and without contrast to evaluate liver lesions.  Based on results, will repeat biopsy, placed a referral to IR and GI specialists.  Heavy alcohol use Patient with long history of heavy alcohol use.  States that he quit using alcohol completely on  03/18/2023.  States he was doing very well.  Approximately 2 weeks ago, he did have a relapse.  States he thought he could drink just 1 beer.   He woke up on 09/01/2023 with severe swelling in his abdomen.  Saw his primary care provider.  Labs indicated significant elevation of LFTs.  His AST was 87, ALT was 47, ALKP 130, and T. bili was 2.1.  States that primary care and his mother insisted he follow-up in the cancer center as soon as possible.  Plan Patient seen with Dr. Lanny, today. Labs reviewed. -Pancytopenia with severe anemia. -- Arrange for 1 unit RBCs on 11/19/2023.  Will schedule IV iron if indicated based on ferritin results which are pending. - CMP with marked improvement.  AST 42, ALT 44, ALKP 111, T. bili 1.3. Get MRI of the abdomen with and without contrast for further evaluation of liver lesions and ascites. Referral to Dr. Saintclair for upper endoscopy and colonoscopy to evaluate for origin of bleeding. Will set up phone visit with patient to review abdominal MRI approximately 1 week after imaging to review and discuss options for management.    The patient understands the plans discussed today and is in agreement with them.  He knows to contact our  office if he develops concerns prior to his next appointment.  I provided 30 minutes of face-to-face time during this encounter and > 50% was spent counseling as documented under my assessment and plan.    Powell FORBES Lessen, NP  [facility] - A DEPT OF [facility] [address] [place] [address] Dept: [phone] Dept Fax: [phone]   Orders Placed  This Encounter  Procedures   MR Abdomen W Wo Contrast    Standing Status:   Future    Expected Date:   09/25/2023    Expiration Date:   09/17/2024    If indicated for the ordered procedure, I authorize the administration of contrast media per Radiology protocol:   Yes    What is the patient's sedation requirement?:   No Sedation    Does the patient have a pacemaker or implanted devices?:   No    Preferred imaging location?:   [facility] (table limit - 500lbs)   Ambulatory referral to Gastroenterology    Referral Priority:   Routine    Referral Type:   Consultation    Referral Reason:   Specialty Services Required    Referred to Provider:   Saintclair Jasper, MD    Number of Visits Requested:   1   Informed Consent Details: Physician/Practitioner Attestation; Transcribe to consent form and obtain patient signature    Standing Status:   Future    Expiration Date:   09/17/2024    Physician/Practitioner attestation of informed consent for blood and or blood product transfusion:   I, the physician/practitioner, attest that I have discussed with the patient the benefits, risks, side effects, alternatives, likelihood of achieving goals and potential problems during recovery for the procedure that I have provided informed consent.    Product(s):   All Product(s)   Care order/instruction    Transfuse Parameters    Standing Status:   Future    Expiration Date:   09/17/2024   Type and screen         Standing Status:   Future    Expiration Date:   09/17/2024      CHIEF COMPLAINT:  CC: Liver mass and pancytopenia  Current Treatment:  pending lab and imaging results  INTERVAL HISTORY:  Yoandri is here today for repeat clinical assessment.  He had labs done 09/01/2023 which showed Hgb 7.6, HCT 26.2, and platelet count 38.  His T. bili also elevated at 2.1, AST 87, ALT 47, and ALKP 130.  That morning, he woke up with significant swelling in his abdomen.  He had recently  restarted drinking  alcohol after quitting on 03/18/2023.  He states that swelling of his belly is not painful.  Reports its slightly improved from visit to primary care on 6/10.  Now on fluid pills which seem to be helping.  He denies nausea and vomiting.  States he has normal bowel movements without abdominal cramping.  States his appetite is good.  States he has intermittent shortness of breath which he attributes to panic attacks.  States his primary care provider will only prescribe a few days of Ativan  to help with withdrawal symptoms from alcohol.  It is that alprazolam  works much better in relieving anxiety for him.  Just having that medication with him relieves anxiety to a degree.  He denies fevers or chills. He denies pain. SABRA His weight has decreased 11 pounds over last 2 weeks.  I  have reviewed the past medical history, past surgical history, social history and family history with the patient and they are unchanged from previous note.  ALLERGIES:  has no known allergies.  MEDICATIONS:  Current Outpatient Medications  Medication Sig Dispense Refill   Continuous Glucose Receiver (FREESTYLE LIBRE 3 READER) DEVI Bring closer to sensor 1 each 3   Continuous Glucose Sensor (DEXCOM G7 SENSOR) MISC Apply new sensor every 10 days for Type 2 Diabetes E11.65 3 each 6   escitalopram  (LEXAPRO ) 20 MG tablet Take 1 tablet (20 mg total) by mouth daily. 30 tablet 3   furosemide  (LASIX ) 20 MG tablet Take 1 tablet (20 mg total) by mouth daily. 30 tablet 11   lisinopril  (ZESTRIL ) 20 MG tablet TAKE 1 TABLET BY MOUTH EVERY DAY 30 tablet 2   LORazepam  (ATIVAN ) 1 MG tablet Take 1 tablet (1 mg total) by mouth every 8 (eight) hours as needed for anxiety. 30 tablet 0   mirtazapine  (REMERON ) 15 MG tablet Take 1 tablet (15 mg total) by mouth at bedtime. 30 tablet 3   naltrexone  (DEPADE) 50 MG tablet Take 1 tablet (50 mg total) by mouth daily. 30 tablet 0   pantoprazole  (PROTONIX ) 40 MG tablet Take 1 tablet (40 mg total) by mouth daily.  30 tablet 2   spironolactone  (ALDACTONE ) 25 MG tablet Take 1 tablet (25 mg total) by mouth daily. 30 tablet 11   tamsulosin  (FLOMAX ) 0.4 MG CAPS capsule TAKE 1 CAPSULE BY MOUTH EVERYDAY AT BEDTIME 30 capsule 2   thiamine  (VITAMIN B1) 100 MG tablet Take 100 mg by mouth daily.     No current facility-administered medications for this visit.      REVIEW OF SYSTEMS:   Constitutional: Denies fevers, chills or abnormal weight loss Eyes: Denies blurriness of vision Ears, nose, mouth, throat, and face: Denies mucositis or sore throat Respiratory: Denies cough, dyspnea or wheezes Cardiovascular: Denies palpitation, chest discomfort or lower extremity swelling Gastrointestinal:  Denies nausea, heartburn or change in bowel habits. Has noted swelling in his abdomen.  Skin: Denies abnormal skin rashes Lymphatics: Denies new lymphadenopathy or easy bruising Neurological:Denies numbness, tingling or new weaknesses Behavioral/Psych: Mood is stable, no new changes  All other systems were reviewed with the patient and are negative.   VITALS:   Today's Vitals   09/18/23 1002  BP: 109/63  Pulse: 89  Resp: 17  Temp: 98.4 F (36.9 C)  TempSrc: Temporal  SpO2: 95%  Weight: 156 lb 4 oz (70.9 kg)  Height: 5' 7 (1.702 m)   Body mass index is 24.47 kg/m.   Wt Readings from Last 3 Encounters:  09/18/23 156 lb 4 oz (70.9 kg)  09/04/23 161 lb 2 oz (73.1 kg)  09/01/23 167 lb (75.8 kg)    Body mass index is 24.47 kg/m.  Performance status (ECOG): 1 - Symptomatic but completely ambulatory  PHYSICAL EXAM:   GENERAL:alert, no distress and comfortable SKIN: skin color, texture, turgor are normal, no rashes or significant lesions EYES: normal, Conjunctiva are pink and non-injected, sclera clear OROPHARYNX:no exudate, no erythema and lips, buccal mucosa, and tongue normal  NECK: supple, thyroid  normal size, non-tender, without nodularity LYMPH:  no palpable lymphadenopathy in the cervical,  axillary or inguinal LUNGS: clear to auscultation and percussion with normal breathing effort HEART: regular rate & rhythm and no murmurs and no lower extremity edema ABDOMEN:abdomen soft, non-tender and normal bowel sounds. There is mild to moderate amount of fluid accumulation in abdomen. He has non tender umbilical  hernia present.  Musculoskeletal:no cyanosis of digits and no clubbing  NEURO: alert & oriented x 3 with fluent speech, no focal motor/sensory deficits  LABORATORY DATA:  I have reviewed the data as listed    Component Value Date/Time   NA 137 09/18/2023 0938   NA 140 09/01/2023 1351   K 3.8 09/18/2023 0938   CL 105 09/18/2023 0938   CO2 26 09/18/2023 0938   GLUCOSE 241 (H) 09/18/2023 0938   BUN 9 09/18/2023 0938   BUN 6 09/01/2023 1351   CREATININE 0.64 09/18/2023 0938   CALCIUM  8.2 (L) 09/18/2023 0938   PROT 5.9 (L) 09/18/2023 0938   PROT 5.9 (L) 09/01/2023 1351   ALBUMIN 3.5 09/18/2023 0938   ALBUMIN 3.6 (L) 09/01/2023 1351   AST 42 (H) 09/18/2023 0938   ALT 44 09/18/2023 0938   ALKPHOS 111 09/18/2023 0938   BILITOT 1.3 (H) 09/18/2023 0938   GFRNONAA >60 09/18/2023 0938   GFRAA >90 08/17/2012 0420   Lab Results  Component Value Date   WBC 4.1 09/18/2023   NEUTROABS 3.2 09/18/2023   HGB 7.5 (L) 09/18/2023   HCT 26.2 (L) 09/18/2023   MCV 75.1 (L) 09/18/2023   PLT 42 (L) 09/18/2023   ADDENDUM I have seen the patient, examined him. I agree with the assessment and and plan and have edited the notes.   Redell lost follow-up.  He has fluctuated in May 2024.  He was referred back by PCP for pancytopenia.He did quit alcohol drinking completely last year, but restarted again recently.  Recent labs reviewed, will repeat CBC and anemia workup, to see if we need blood transfusion and/or IV iron.  I referred him to IR last year, but he did not go to his appointment.  He has not follow-up with GI either.  He needs a repeated liver MRI to follow-up his liver lesion. Will  call him after scan. Also refer him back to GI Dr. Saintclair for endoscopy surveillance of his liver cirrhosis.   Onita Mattock MD 09/18/2023

## 2023-09-18 ENCOUNTER — Inpatient Hospital Stay: Payer: Self-pay

## 2023-09-18 ENCOUNTER — Inpatient Hospital Stay: Payer: Self-pay | Attending: Nurse Practitioner | Admitting: Nurse Practitioner

## 2023-09-18 VITALS — BP 109/63 | HR 89 | Temp 98.4°F | Resp 17 | Ht 67.0 in | Wt 156.2 lb

## 2023-09-18 DIAGNOSIS — R162 Hepatomegaly with splenomegaly, not elsewhere classified: Secondary | ICD-10-CM | POA: Insufficient documentation

## 2023-09-18 DIAGNOSIS — D509 Iron deficiency anemia, unspecified: Secondary | ICD-10-CM

## 2023-09-18 DIAGNOSIS — K769 Liver disease, unspecified: Secondary | ICD-10-CM

## 2023-09-18 DIAGNOSIS — R160 Hepatomegaly, not elsewhere classified: Secondary | ICD-10-CM

## 2023-09-18 DIAGNOSIS — K7460 Unspecified cirrhosis of liver: Secondary | ICD-10-CM | POA: Diagnosis not present

## 2023-09-18 DIAGNOSIS — D61818 Other pancytopenia: Secondary | ICD-10-CM

## 2023-09-18 DIAGNOSIS — D649 Anemia, unspecified: Secondary | ICD-10-CM

## 2023-09-18 LAB — CBC WITH DIFFERENTIAL (CANCER CENTER ONLY)
Abs Immature Granulocytes: 0.01 10*3/uL (ref 0.00–0.07)
Basophils Absolute: 0 10*3/uL (ref 0.0–0.1)
Basophils Relative: 1 %
Eosinophils Absolute: 0.2 10*3/uL (ref 0.0–0.5)
Eosinophils Relative: 4 %
HCT: 26.2 % — ABNORMAL LOW (ref 39.0–52.0)
Hemoglobin: 7.5 g/dL — ABNORMAL LOW (ref 13.0–17.0)
Immature Granulocytes: 0 %
Lymphocytes Relative: 10 %
Lymphs Abs: 0.4 10*3/uL — ABNORMAL LOW (ref 0.7–4.0)
MCH: 21.5 pg — ABNORMAL LOW (ref 26.0–34.0)
MCHC: 28.6 g/dL — ABNORMAL LOW (ref 30.0–36.0)
MCV: 75.1 fL — ABNORMAL LOW (ref 80.0–100.0)
Monocytes Absolute: 0.3 10*3/uL (ref 0.1–1.0)
Monocytes Relative: 8 %
Neutro Abs: 3.2 10*3/uL (ref 1.7–7.7)
Neutrophils Relative %: 77 %
Platelet Count: 42 10*3/uL — ABNORMAL LOW (ref 150–400)
RBC: 3.49 MIL/uL — ABNORMAL LOW (ref 4.22–5.81)
RDW: 17.2 % — ABNORMAL HIGH (ref 11.5–15.5)
WBC Count: 4.1 10*3/uL (ref 4.0–10.5)
nRBC: 0 % (ref 0.0–0.2)

## 2023-09-18 LAB — CMP (CANCER CENTER ONLY)
ALT: 44 U/L (ref 0–44)
AST: 42 U/L — ABNORMAL HIGH (ref 15–41)
Albumin: 3.5 g/dL (ref 3.5–5.0)
Alkaline Phosphatase: 111 U/L (ref 38–126)
Anion gap: 6 (ref 5–15)
BUN: 9 mg/dL (ref 6–20)
CO2: 26 mmol/L (ref 22–32)
Calcium: 8.2 mg/dL — ABNORMAL LOW (ref 8.9–10.3)
Chloride: 105 mmol/L (ref 98–111)
Creatinine: 0.64 mg/dL (ref 0.61–1.24)
GFR, Estimated: >60 mL/min (ref >60)
Glucose, Bld: 241 mg/dL — ABNORMAL HIGH (ref 70–99)
Potassium: 3.8 mmol/L (ref 3.5–5.1)
Sodium: 137 mmol/L (ref 135–145)
Total Bilirubin: 1.3 mg/dL — ABNORMAL HIGH (ref 0.0–1.2)
Total Protein: 5.9 g/dL — ABNORMAL LOW (ref 6.5–8.1)

## 2023-09-18 LAB — SAMPLE TO BLOOD BANK

## 2023-09-18 LAB — FERRITIN
Ferritin: 10 ng/mL — ABNORMAL LOW (ref 24–336)
Ferritin: 9 ng/mL — ABNORMAL LOW (ref 24–336)

## 2023-09-18 LAB — VITAMIN B12: Vitamin B-12: 1143 pg/mL — ABNORMAL HIGH (ref 180–914)

## 2023-09-18 NOTE — Assessment & Plan Note (Addendum)
-  presented with coffee-ground emesis, admitted 12/24/21. CT AP showed liver cirrhosis, splenomegaly, and a 4.6 cm liver mass. Baseline AFP was WNL. -MRI showed 4.6 cm hemorrhagic mass in segment 8, difficult to characterize by LI-RADS due to lack non rim enhancement, but nonetheless concerning for [facility] until proven otherwise -liver biopsy on 12/27/21 showed predominantly necrotic tissue, no evidence of malignancy. -CT scan from May 22, 2022 showed stable liver mass -- liver MRI from Aug 15, 2022 showed stable 4 cm hemorrhagic mass in segment 8, and a new 1.2 centimeter hypervascular mass in segment 4B, consistent with HCC, LI-RADS category 5.   -Biopsy showed necrotic tissue, he has not had ablation. Was referred to IR to have ablative procedure multiple times and never went.  - 09/18/2023 -new abdominal MRI with and without contrast will be ordered for further evaluation of liver lesions.  Will obtain new IR referral as indicated.  Will refer to GI as well.

## 2023-09-19 ENCOUNTER — Inpatient Hospital Stay: Payer: Self-pay

## 2023-09-19 LAB — AFP TUMOR MARKER: AFP, Serum, Tumor Marker: 3.7 ng/mL (ref 0.0–8.4)

## 2023-09-20 ENCOUNTER — Ambulatory Visit: Payer: Self-pay | Admitting: Hematology

## 2023-09-21 ENCOUNTER — Other Ambulatory Visit: Payer: Self-pay | Admitting: Nurse Practitioner

## 2023-09-21 DIAGNOSIS — D509 Iron deficiency anemia, unspecified: Secondary | ICD-10-CM | POA: Insufficient documentation

## 2023-09-21 LAB — FOLATE RBC
Folate, Hemolysate: 606 ng/mL
Folate, RBC: 2196 ng/mL (ref >498)
Hematocrit: 27.6 % — ABNORMAL LOW (ref 37.5–51.0)

## 2023-09-21 NOTE — Progress Notes (Signed)
 Orders for IV Venofer 200 mg weekly for  5 weeks has been sent to [facility]. Per [place], this is preferred IV iron by his insurance.  -Powell Lessen, NP

## 2023-09-22 ENCOUNTER — Encounter: Payer: Self-pay | Admitting: Nurse Practitioner

## 2023-09-22 ENCOUNTER — Other Ambulatory Visit: Payer: Self-pay

## 2023-09-22 ENCOUNTER — Telehealth: Payer: Self-pay

## 2023-09-22 DIAGNOSIS — D649 Anemia, unspecified: Secondary | ICD-10-CM

## 2023-09-22 DIAGNOSIS — D509 Iron deficiency anemia, unspecified: Secondary | ICD-10-CM

## 2023-09-22 NOTE — Telephone Encounter (Signed)
 [name], patient will be scheduled as soon as possible.  Auth Submission: NO AUTH NEEDED Site of care: Site of care: [facility] Payer: [employer] next Medication & CPT/J Code(s) submitted: Venofer (Iron Sucrose) J1756 Diagnosis Code:  Route of submission (phone, fax, portal): portal Phone # Fax # Auth type: Buy/Bill PB Units/visits requested: 200mg  x 5 doses Reference number:  Approval from: 09/22/23 to 01/23/24

## 2023-09-22 NOTE — Telephone Encounter (Signed)
 Spoke with pt's mom Pat via telephone regarding appts for lab & blood transfusion.  Informed Bruna that the pt has lab here at [facility] on 09/23/2023 @10 :30am and blood transfusion on 09/24/2023 @0900am  at [facility] [address] [facility] [address] [place], [place].  Pat confirmed appts and stated she and pt will be at both appts.    Orders placed in EPIC for labs and blood transfusion at [facility] on 09/24/2023.

## 2023-09-23 ENCOUNTER — Telehealth: Payer: Self-pay

## 2023-09-23 ENCOUNTER — Inpatient Hospital Stay: Attending: Nurse Practitioner

## 2023-09-23 ENCOUNTER — Other Ambulatory Visit: Payer: Self-pay

## 2023-09-23 NOTE — Telephone Encounter (Signed)
 Contacted pt since he did not arrive to his lab appt scheduled for today 09/23/2023.  Tried contacting pt's mom 1st but was unable to reach her nor LVM.  Pt stated he was not aware of the appt today nor tomorrow.  Stated the appt was for lab today and a blood transfusion for tomorrow.  Pt asked what other appts does he have.  Stated pt has a MRI scheduled on Saturday 09/26/2023.  Pt stated he was not aware of the appt for the MRI as well.  Pt stated if he could get hospital transportation setup because he does not have reliable transportation to and from his appts.  Stated this nurse will contact Sherlean Menghini in our Transportation Dept regarding the pt's request.  Stated Sherlean or someone from his team will follow-up with the pt.  Pt verbalized understanding and had no further questions or concerns.  Notified [facility] that pt cancelled his infusion on 09/24/2023.  Sent email to [employer] regarding pt's transportation issues.

## 2023-09-24 ENCOUNTER — Telehealth: Payer: Self-pay

## 2023-09-24 ENCOUNTER — Inpatient Hospital Stay

## 2023-09-24 NOTE — Telephone Encounter (Signed)
 Pt's mother called back today apologizing about pt not showing up to his lab and blood transfusion appts.  Mom stated that when she came to pickup pt he refused to go to appts.  Mom stated she was grateful for the efforts of trying to get him scheduled for the blood transfusion but she will let the pt handle his appts for now.

## 2023-09-26 ENCOUNTER — Ambulatory Visit (HOSPITAL_COMMUNITY): Admission: RE | Admit: 2023-09-26 | Payer: Self-pay | Source: Ambulatory Visit

## 2023-10-01 ENCOUNTER — Telehealth: Payer: Self-pay | Admitting: Hematology

## 2023-10-01 NOTE — Telephone Encounter (Signed)
 Scheduled appointments per missed appointments. Called and left a VM with appointment details for the patient.

## 2023-10-02 ENCOUNTER — Encounter: Payer: Self-pay | Admitting: Nurse Practitioner

## 2023-10-02 ENCOUNTER — Other Ambulatory Visit: Payer: Self-pay

## 2023-10-02 NOTE — Telephone Encounter (Addendum)
 Spoke with Leita at [facility]. Was advised that they have made multiple unsuccessful attempts to reach the patient x telephone and mychart since 09/22/23. Let Leita know that I would make Dr. Lanny aware.  ----- Message from Onita Lanny sent at 09/29/2023  4:09 PM EDT ----- My nurses, could you reach out to [facility] office to make sure they have contacted him to schedule his iv iron? Thanks   Onita Lanny  ----- Message ----- From: Hanford Powell BRAVO, NP Sent: 09/21/2023   4:00 PM EDT To: Norleen KANDICE Spain, CMA; Onita Lanny, MD; Anders P #  Orders for IV Venofer 200 mg weekly for  5 weeks has been sent to [facility]. Per [place], this is preferred IV iron by his insurance. John, can you let the patient know that he should expect to be contacted  by [facility] to set up infusion appointments. Thanks. Powell, NP ----- Message ----- From: Lanny Onita, MD Sent: 09/20/2023  10:15 AM EDT To: Powell BRAVO [place], NP; Norleen KANDICE Spain, CMA; Sh#  Heather, his iron is low, please order iv iron at [facility], per his insurance preference, thanks   Onita Lanny  ----- Message ----- From: Rebecka, [facility] [place] Sent: 09/18/2023   1:50 PM EDT To: Onita Lanny, MD

## 2023-10-05 ENCOUNTER — Inpatient Hospital Stay

## 2023-10-05 ENCOUNTER — Telehealth: Payer: Self-pay

## 2023-10-05 NOTE — Telephone Encounter (Signed)
 Patient did not arrive for his lab appointment or his blood transfusion appointment. Contacted patient via telephone call to inquire whereabouts. Patient stated he was not aware of today's appointments and requested to reschedule. Patient stated his mother schedules his appointments without him knowing. Patient stated he will contact the facility to reschedule appointments.

## 2023-10-06 ENCOUNTER — Ambulatory Visit: Admitting: Internal Medicine

## 2023-10-27 ENCOUNTER — Encounter: Payer: Self-pay | Admitting: Nurse Practitioner

## 2023-10-28 ENCOUNTER — Ambulatory Visit

## 2023-10-28 MED ORDER — IRON SUCROSE 20 MG/ML IV SOLN
200.0000 mg | Freq: Once | INTRAVENOUS | Status: DC
Start: 2023-10-28 — End: 2023-10-29

## 2023-10-29 ENCOUNTER — Encounter: Payer: Self-pay | Admitting: Nurse Practitioner

## 2023-11-04 ENCOUNTER — Ambulatory Visit

## 2023-11-04 ENCOUNTER — Other Ambulatory Visit: Payer: Self-pay | Admitting: Internal Medicine

## 2023-11-04 VITALS — BP 108/59 | HR 68 | Temp 98.5°F | Resp 18 | Ht 64.0 in | Wt 152.4 lb

## 2023-11-04 DIAGNOSIS — D509 Iron deficiency anemia, unspecified: Secondary | ICD-10-CM

## 2023-11-04 MED ORDER — DIPHENHYDRAMINE HCL 25 MG PO CAPS
25.0000 mg | ORAL_CAPSULE | Freq: Once | ORAL | Status: AC
  Administered 2023-11-04 (×2): 25 mg via ORAL
  Filled 2023-11-04: qty 1

## 2023-11-04 MED ORDER — ACETAMINOPHEN 325 MG PO TABS
650.0000 mg | ORAL_TABLET | Freq: Once | ORAL | Status: AC
  Administered 2023-11-04 (×2): 650 mg via ORAL
  Filled 2023-11-04: qty 2

## 2023-11-04 MED ORDER — IRON SUCROSE 20 MG/ML IV SOLN
200.0000 mg | Freq: Once | INTRAVENOUS | Status: AC
  Administered 2023-11-04 (×2): 200 mg via INTRAVENOUS
  Filled 2023-11-04: qty 10

## 2023-11-04 NOTE — Progress Notes (Signed)
 Diagnosis: Iron Deficiency Anemia  Provider:  Chilton Greathouse MD  Procedure: IV Push  IV Type: Peripheral, IV Location: R Forearm  Venofer (Iron Sucrose), Dose: 200 mg  Post Infusion IV Care: Patient declined observation and Peripheral IV Discontinued  Discharge: Condition: Good, Destination: Home . AVS Provided  Performed by:  Adriana Mccallum, RN

## 2023-11-04 NOTE — Patient Instructions (Signed)

## 2023-11-11 ENCOUNTER — Ambulatory Visit

## 2023-11-11 ENCOUNTER — Other Ambulatory Visit: Payer: Self-pay | Admitting: Nurse Practitioner

## 2023-11-11 VITALS — BP 122/75 | HR 92 | Temp 98.5°F | Resp 18 | Ht 67.0 in | Wt 153.6 lb

## 2023-11-11 DIAGNOSIS — D509 Iron deficiency anemia, unspecified: Secondary | ICD-10-CM | POA: Diagnosis not present

## 2023-11-11 MED ORDER — ACETAMINOPHEN 325 MG PO TABS
650.0000 mg | ORAL_TABLET | Freq: Once | ORAL | Status: AC
  Administered 2023-11-11: 650 mg via ORAL
  Filled 2023-11-11: qty 2

## 2023-11-11 MED ORDER — IRON SUCROSE 20 MG/ML IV SOLN
200.0000 mg | Freq: Once | INTRAVENOUS | Status: AC
  Administered 2023-11-11: 200 mg via INTRAVENOUS
  Filled 2023-11-11: qty 10

## 2023-11-11 MED ORDER — DIPHENHYDRAMINE HCL 25 MG PO CAPS
25.0000 mg | ORAL_CAPSULE | Freq: Once | ORAL | Status: AC
  Administered 2023-11-11: 25 mg via ORAL
  Filled 2023-11-11: qty 1

## 2023-11-11 NOTE — Progress Notes (Signed)
 Diagnosis: Iron  Deficiency Anemia  Provider:  Praveen Mannam MD  Procedure: IV Push  IV Type: Peripheral, IV Location: R Forearm  Venofer  (Iron  Sucrose), Dose: 200 mg  Post Infusion IV Care: Patient declined observation and Peripheral IV Discontinued  Discharge: Condition: Good, Destination: Home . AVS Provided  Performed by:  Kayshaun Polanco, RN

## 2023-11-18 ENCOUNTER — Ambulatory Visit

## 2023-11-18 VITALS — BP 128/74 | HR 76 | Temp 98.8°F | Resp 20 | Ht 67.0 in | Wt 155.2 lb

## 2023-11-18 DIAGNOSIS — D509 Iron deficiency anemia, unspecified: Secondary | ICD-10-CM

## 2023-11-18 MED ORDER — SODIUM CHLORIDE 0.9 % IV BOLUS
250.0000 mL | Freq: Once | INTRAVENOUS | Status: DC
  Filled 2023-11-18: qty 250

## 2023-11-18 MED ORDER — DIPHENHYDRAMINE HCL 25 MG PO CAPS
25.0000 mg | ORAL_CAPSULE | Freq: Once | ORAL | Status: AC
  Administered 2023-11-18: 25 mg via ORAL
  Filled 2023-11-18: qty 1

## 2023-11-18 MED ORDER — IRON SUCROSE 20 MG/ML IV SOLN
200.0000 mg | Freq: Once | INTRAVENOUS | Status: AC
  Administered 2023-11-18: 200 mg via INTRAVENOUS
  Filled 2023-11-18: qty 10

## 2023-11-18 MED ORDER — ACETAMINOPHEN 325 MG PO TABS
650.0000 mg | ORAL_TABLET | Freq: Once | ORAL | Status: AC
  Administered 2023-11-18: 650 mg via ORAL
  Filled 2023-11-18: qty 2

## 2023-11-18 NOTE — Progress Notes (Signed)
 Diagnosis: Iron Deficiency Anemia  Provider:  Chilton Greathouse MD  Procedure: IV Push  IV Type: Peripheral, IV Location: R Antecubital  Venofer (Iron Sucrose), Dose: 200 mg  Post Infusion IV Care: Peripheral IV Discontinued  Discharge: Condition: Good, Destination: Home . AVS Declined  Performed by:  Marlow Baars [place], RN

## 2023-11-25 ENCOUNTER — Ambulatory Visit

## 2023-11-25 VITALS — BP 154/80 | HR 83 | Temp 98.2°F | Resp 16 | Ht 67.0 in | Wt 151.0 lb

## 2023-11-25 DIAGNOSIS — D509 Iron deficiency anemia, unspecified: Secondary | ICD-10-CM | POA: Diagnosis not present

## 2023-11-25 MED ORDER — DIPHENHYDRAMINE HCL 25 MG PO CAPS
25.0000 mg | ORAL_CAPSULE | Freq: Once | ORAL | Status: AC
  Administered 2023-11-25: 25 mg via ORAL
  Filled 2023-11-25: qty 1

## 2023-11-25 MED ORDER — IRON SUCROSE 20 MG/ML IV SOLN
200.0000 mg | Freq: Once | INTRAVENOUS | Status: AC
  Administered 2023-11-25: 200 mg via INTRAVENOUS
  Filled 2023-11-25: qty 10

## 2023-11-25 MED ORDER — ACETAMINOPHEN 325 MG PO TABS
650.0000 mg | ORAL_TABLET | Freq: Once | ORAL | Status: AC
  Administered 2023-11-25: 650 mg via ORAL
  Filled 2023-11-25: qty 2

## 2023-11-25 NOTE — Progress Notes (Signed)
 Diagnosis: Iron  Deficiency Anemia  Provider:  Mannam, Praveen MD  Procedure: IV Push  IV Type: Peripheral, IV Location: R Antecubital  Venofer  (Iron  Sucrose), Dose: 200 mg  Post Infusion IV Care: Observation period completed and Peripheral IV Discontinued. 15 minute observation per patient request.  Discharge: Condition: Good, Destination: Home . AVS Declined  Performed by:  Rocky FORBES Sar, RN

## 2023-11-29 ENCOUNTER — Other Ambulatory Visit: Payer: Self-pay | Admitting: Internal Medicine

## 2023-12-02 ENCOUNTER — Ambulatory Visit (INDEPENDENT_AMBULATORY_CARE_PROVIDER_SITE_OTHER)

## 2023-12-02 VITALS — BP 155/86 | HR 87 | Temp 98.5°F | Resp 16 | Ht 67.0 in | Wt 155.4 lb

## 2023-12-02 DIAGNOSIS — D509 Iron deficiency anemia, unspecified: Secondary | ICD-10-CM

## 2023-12-02 MED ORDER — ACETAMINOPHEN 325 MG PO TABS
650.0000 mg | ORAL_TABLET | Freq: Once | ORAL | Status: AC
  Administered 2023-12-02: 650 mg via ORAL
  Filled 2023-12-02: qty 2

## 2023-12-02 MED ORDER — DIPHENHYDRAMINE HCL 25 MG PO CAPS
25.0000 mg | ORAL_CAPSULE | Freq: Once | ORAL | Status: AC
  Administered 2023-12-02: 25 mg via ORAL
  Filled 2023-12-02: qty 1

## 2023-12-02 MED ORDER — IRON SUCROSE 20 MG/ML IV SOLN
200.0000 mg | Freq: Once | INTRAVENOUS | Status: AC
  Administered 2023-12-02: 200 mg via INTRAVENOUS
  Filled 2023-12-02: qty 10

## 2023-12-02 NOTE — Progress Notes (Signed)
Diagnosis: Iron Deficiency Anemia  Provider:  Chilton Greathouse MD  Procedure: IV Push  IV Type: Peripheral, IV Location: L Hand  Venofer (Iron Sucrose), Dose: 200 mg  Post Infusion IV Care: Observation period completed and Peripheral IV Discontinued  Discharge: Condition: Good, Destination: Home . AVS Declined  Performed by:  Rico Ala, LPN

## 2023-12-14 ENCOUNTER — Other Ambulatory Visit: Payer: Self-pay | Admitting: Internal Medicine

## 2023-12-23 ENCOUNTER — Telehealth: Payer: Self-pay | Admitting: Nurse Practitioner

## 2023-12-23 NOTE — Telephone Encounter (Signed)
 Scheduled appointment per left VM from the patient. Called and left VM with the appointment details for the patient.

## 2023-12-25 ENCOUNTER — Ambulatory Visit: Admitting: Internal Medicine

## 2023-12-27 ENCOUNTER — Other Ambulatory Visit: Payer: Self-pay | Admitting: Nurse Practitioner

## 2023-12-27 DIAGNOSIS — D61818 Other pancytopenia: Secondary | ICD-10-CM

## 2023-12-27 DIAGNOSIS — D509 Iron deficiency anemia, unspecified: Secondary | ICD-10-CM

## 2023-12-27 NOTE — Progress Notes (Deleted)
 Patient Care Team: Amin, Saad, MD as PCP - General (Internal Medicine) Lanny Callander, MD as Attending Physician (Hematology and Oncology)  Clinic Day:  12/27/2023  Referring physician: Caleen Dirks, MD  ASSESSMENT & PLAN:   Assessment & Plan: Liver mass -presented with coffee-ground emesis, admitted 12/24/21. CT AP showed liver cirrhosis, splenomegaly, and a 4.6 cm liver mass. Baseline AFP was WNL. -MRI showed 4.6 cm hemorrhagic mass in segment 8, difficult to characterize by LI-RADS due to lack non rim enhancement, but nonetheless concerning for [facility] until proven otherwise -liver biopsy on 12/27/21 showed predominantly necrotic tissue, no evidence of malignancy. -CT scan from May 22, 2022 showed stable liver mass -- liver MRI from Aug 15, 2022 showed stable 4 cm hemorrhagic mass in segment 8, and a new 1.2 centimeter hypervascular mass in segment 4B, consistent with HCC, LI-RADS category 5.   -Biopsy showed necrotic tissue, he has not had ablation. Was referred to IR to have ablative procedure multiple times and never went.  - 09/18/2023 -new abdominal MRI with and without contrast will be ordered for further evaluation of liver lesions.  Will obtain new IR referral as indicated.  Will refer to GI as well.    The patient understands the plans discussed today and is in agreement with them.  He knows to contact our office if he develops concerns prior to his next appointment.  I provided *** minutes of face-to-face time during this encounter and > 50% was spent counseling as documented under my assessment and plan.    Powell FORBES Lessen, NP  [facility] - A DEPT OF [facility] [address] [place] [address] Dept: [phone] Dept Fax: [phone]   No orders of the defined types were placed in this encounter.     CHIEF COMPLAINT:  CC: Liver mass; pancytopenia with severe anemia  Current Treatment:  Pending  INTERVAL HISTORY:  [name] is here today for repeat clinical assessment.  He last saw me on 09/18/2023.  He denies fevers or chills. He denies pain. His appetite is good. His weight {Weight change:10426}.  I have reviewed the past medical history, past surgical history, social history and family history with the patient and they are unchanged from previous note.  ALLERGIES:  has no known allergies.  MEDICATIONS:  Current Outpatient Medications  Medication Sig Dispense Refill   Continuous Glucose Receiver (FREESTYLE LIBRE 3 READER) DEVI Bring closer to sensor 1 each 3   Continuous Glucose Sensor (DEXCOM G7 SENSOR) MISC Apply new sensor every 10 days for Type 2 Diabetes E11.65 3 each 6   escitalopram  (LEXAPRO ) 20 MG tablet Take 1 tablet (20 mg total) by mouth daily. 30 tablet 3   furosemide  (LASIX ) 20 MG tablet Take 1 tablet (20 mg total) by mouth daily. 30 tablet 11   lisinopril  (ZESTRIL ) 20 MG tablet TAKE 1 TABLET BY MOUTH EVERY DAY 30 tablet 2   LORazepam  (ATIVAN ) 1 MG tablet Take 1 tablet (1 mg total) by mouth every 8 (eight) hours as needed for anxiety. 30 tablet 0   mirtazapine  (REMERON ) 15 MG tablet Take 1 tablet (15 mg total) by mouth at bedtime. 30 tablet 3   naltrexone  (DEPADE) 50 MG tablet Take 1 tablet (50 mg total) by mouth daily. 30 tablet 0   pantoprazole  (PROTONIX ) 40 MG tablet Take 1 tablet (40 mg total) by mouth daily. 30 tablet 2   spironolactone  (ALDACTONE ) 25 MG tablet Take 1  tablet (25 mg total) by mouth daily. 30 tablet 11   tamsulosin  (FLOMAX ) 0.4 MG CAPS capsule TAKE 1 CAPSULE BY MOUTH EVERYDAY AT BEDTIME 30 capsule 2   thiamine  (VITAMIN B1) 100 MG tablet Take 100 mg by mouth daily.     No current facility-administered medications for this visit.    HISTORY OF PRESENT ILLNESS:   Oncology History   No history exists.      REVIEW OF SYSTEMS:   Constitutional: Denies fevers, chills or abnormal weight loss Eyes: Denies blurriness of vision Ears, nose,  mouth, throat, and face: Denies mucositis or sore throat Respiratory: Denies cough, dyspnea or wheezes Cardiovascular: Denies palpitation, chest discomfort or lower extremity swelling Gastrointestinal:  Denies nausea, heartburn or change in bowel habits Skin: Denies abnormal skin rashes Lymphatics: Denies new lymphadenopathy or easy bruising Neurological:Denies numbness, tingling or new weaknesses Behavioral/Psych: Mood is stable, no new changes  All other systems were reviewed with the patient and are negative.   VITALS:  There were no vitals taken for this visit.  Wt Readings from Last 3 Encounters:  12/02/23 155 lb 6.4 oz (70.5 kg)  11/25/23 151 lb (68.5 kg)  11/18/23 155 lb 3.2 oz (70.4 kg)    There is no height or weight on file to calculate BMI.  Performance status (ECOG): {CHL ONC [id number]  PHYSICAL EXAM:   GENERAL:alert, no distress and comfortable SKIN: skin color, texture, turgor are normal, no rashes or significant lesions EYES: normal, Conjunctiva are pink and non-injected, sclera clear OROPHARYNX:no exudate, no erythema and lips, buccal mucosa, and tongue normal  NECK: supple, thyroid  normal size, non-tender, without nodularity LYMPH:  no palpable lymphadenopathy in the cervical, axillary or inguinal LUNGS: clear to auscultation and percussion with normal breathing effort HEART: regular rate & rhythm and no murmurs and no lower extremity edema ABDOMEN:abdomen soft, non-tender and normal bowel sounds Musculoskeletal:no cyanosis of digits and no clubbing  NEURO: alert & oriented x 3 with fluent speech, no focal motor/sensory deficits  LABORATORY DATA:  I have reviewed the data as listed    Component Value Date/Time   NA 137 09/18/2023 0938   NA 140 09/01/2023 1351   K 3.8 09/18/2023 0938   CL 105 09/18/2023 0938   CO2 26 09/18/2023 0938   GLUCOSE 241 (H) 09/18/2023 0938   BUN 9 09/18/2023 0938   BUN 6 09/01/2023 1351   CREATININE 0.64 09/18/2023 0938    CALCIUM  8.2 (L) 09/18/2023 0938   PROT 5.9 (L) 09/18/2023 0938   PROT 5.9 (L) 09/01/2023 1351   ALBUMIN 3.5 09/18/2023 0938   ALBUMIN 3.6 (L) 09/01/2023 1351   AST 42 (H) 09/18/2023 0938   ALT 44 09/18/2023 0938   ALKPHOS 111 09/18/2023 0938   BILITOT 1.3 (H) 09/18/2023 0938   GFRNONAA >60 09/18/2023 0938   GFRAA >90 08/17/2012 0420    No results found for: SPEP, UPEP  Lab Results  Component Value Date   WBC 4.1 09/18/2023   NEUTROABS 3.2 09/18/2023   HGB 7.5 (L) 09/18/2023   HCT 27.6 (L) 09/18/2023   MCV 75.1 (L) 09/18/2023   PLT 42 (L) 09/18/2023      Chemistry      Component Value Date/Time   NA 137 09/18/2023 0938   NA 140 09/01/2023 1351   K 3.8 09/18/2023 0938   CL 105 09/18/2023 0938   CO2 26 09/18/2023 0938   BUN 9 09/18/2023 0938   BUN 6 09/01/2023 1351   CREATININE 0.64 09/18/2023 9061  Component Value Date/Time   CALCIUM  8.2 (L) 09/18/2023 0938   ALKPHOS 111 09/18/2023 0938   AST 42 (H) 09/18/2023 0938   ALT 44 09/18/2023 0938   BILITOT 1.3 (H) 09/18/2023 9061       RADIOGRAPHIC STUDIES: I have personally reviewed the radiological images as listed and agreed with the findings in the report. No results found.

## 2023-12-27 NOTE — Assessment & Plan Note (Deleted)
-  presented with coffee-ground emesis, admitted 12/24/21. CT AP showed liver cirrhosis, splenomegaly, and a 4.6 cm liver mass. Baseline AFP was WNL. -MRI showed 4.6 cm hemorrhagic mass in segment 8, difficult to characterize by LI-RADS due to lack non rim enhancement, but nonetheless concerning for [facility] until proven otherwise -liver biopsy on 12/27/21 showed predominantly necrotic tissue, no evidence of malignancy. -CT scan from May 22, 2022 showed stable liver mass -- liver MRI from Aug 15, 2022 showed stable 4 cm hemorrhagic mass in segment 8, and a new 1.2 centimeter hypervascular mass in segment 4B, consistent with HCC, LI-RADS category 5.   -Biopsy showed necrotic tissue, he has not had ablation. Was referred to IR to have ablative procedure multiple times and never went.  - 09/18/2023 -new abdominal MRI with and without contrast will be ordered for further evaluation of liver lesions.  Will obtain new IR referral as indicated.  Will refer to GI as well.

## 2023-12-28 ENCOUNTER — Inpatient Hospital Stay: Admitting: Nurse Practitioner

## 2023-12-28 DIAGNOSIS — R160 Hepatomegaly, not elsewhere classified: Secondary | ICD-10-CM

## 2023-12-29 ENCOUNTER — Telehealth: Payer: Self-pay | Admitting: Nurse Practitioner

## 2023-12-29 NOTE — Telephone Encounter (Signed)
 [name] CALLED IN AND LVM TO CANCEL HIS APPOINTMENT BECAUSE HE DID NOT HAVE A RIDE.

## 2023-12-29 NOTE — Telephone Encounter (Signed)
 I lvm with [name] requesting that he return my call to re-schedule his cancelled appointment with [facility] on 10/6.

## 2023-12-31 ENCOUNTER — Ambulatory Visit: Admitting: Family Medicine

## 2024-01-16 ENCOUNTER — Other Ambulatory Visit: Payer: Self-pay | Admitting: Internal Medicine

## 2024-01-23 DEATH — deceased

## 2024-02-09 ENCOUNTER — Ambulatory Visit: Admitting: Family Medicine

## 2024-02-09 NOTE — Progress Notes (Incomplete)
 [facility] [address] [place] [address] Dept: [phone] Dept Fax: [phone]  New Patient Office Visit  Subjective:    Patient ID: [name], male    DOB: Oct 14, 1970, 53 y.o..   MRN: [id number]  No chief complaint on file.  History of Present Illness:  Patient is in today to establish care.  Mr. [name] has a history of liver mass and iron  deficiency anemia being followed by hematology and oncology.   Mr. [name] has a history of diabetes mellitus.   Mr. [name] has a history of hypertension, which is controlled with lisinopril  20 mg daily, furosemide  20 mg daily, and spironolactone  25 mg daily.   Mr. [name] has a history of anxiety, managed with lorazepam  1 mg as needed and mirtazapine  15 mg daily.   Past Medical History: Patient Active Problem List   Diagnosis Date Noted   Iron  deficiency anemia 09/21/2023   Alcoholic cirrhosis of liver with ascites (HCC) 09/01/2023   Blood loss anemia 09/01/2023   Alcohol abuse 03/19/2023   Alcohol withdrawal with delirium (HCC) 01/01/2023   Major depressive disorder 07/25/2022   PICC (peripherally inserted central catheter) in place 06/24/2022   Medication management 06/24/2022   MSSA bacteremia 05/27/2022   UTI (urinary tract infection) 05/26/2022   Hypophosphatemia 05/25/2022   Hyponatremia 05/25/2022   Hypoxia 05/25/2022   History of eye trauma 05/23/2022   Hematoma of right flank 05/23/2022   Recurrent falls 05/22/2022   Psychophysiological insomnia 04/03/2022   Benign prostatic hyperplasia (BPH) with straining on urination 04/03/2022   Pancytopenia, acquired (HCC) 02/03/2022   Liver mass    Low TSH level 12/25/2021   GIB (gastrointestinal bleeding) 12/24/2021   DM (diabetes mellitus), type 2 with complications (HCC) 12/24/2021   Alcohol withdrawal syndrome without complication (HCC) 12/12/2020   Substance induced mood disorder (HCC) 12/09/2020    Hypokalemia 08/15/2012   Hypomagnesemia 08/15/2012   Other and unspecified hyperlipidemia 08/15/2012   Encephalopathy acute 08/09/2012   Alcohol withdrawal with complication with inpatient treatment (HCC) 08/08/2012   Portal hypertension (HCC) 08/08/2012   Splenomegaly 08/08/2012   Alcoholic hepatitis without ascites (HCC) 08/08/2012   Hiatal hernia 08/08/2012   Elevated blood alcohol level 08/06/2012   Acute pancreatitis 08/06/2012   Alcoholic cirrhosis of liver without ascites (HCC) 08/06/2012   Thrombocytopenia 08/06/2012   Essential hypertension    Anxiety    DVT (deep venous thrombosis) (HCC)    Past Surgical History:  Procedure Laterality Date   BLOOD PATCH     ESOPHAGOGASTRODUODENOSCOPY N/A 12/26/2021   Procedure: ESOPHAGOGASTRODUODENOSCOPY (EGD);  Surgeon: Saintclair Jasper, MD;  Location: [facility];  Service: Gastroenterology;  Laterality: N/A;   TONSILLECTOMY     Family History  Problem Relation Age of Onset   Hypertension Father    Outpatient Medications Prior to Visit  Medication Sig Dispense Refill   Continuous Glucose Receiver (FREESTYLE LIBRE 3 READER) DEVI Bring closer to sensor 1 each 3   Continuous Glucose Sensor (DEXCOM G7 SENSOR) MISC Apply new sensor every 10 days for Type 2 Diabetes E11.65 3 each 6   escitalopram  (LEXAPRO ) 20 MG tablet Take 1 tablet (20 mg total) by mouth daily. 30 tablet 3   furosemide  (LASIX ) 20 MG tablet Take 1 tablet (20 mg total) by mouth daily. 30 tablet 11   lisinopril  (ZESTRIL ) 20 MG tablet TAKE 1 TABLET BY MOUTH EVERY DAY 30 tablet 2   LORazepam  (ATIVAN ) 1 MG tablet Take 1 tablet (1 mg total) by  mouth every 8 (eight) hours as needed for anxiety. 30 tablet 0   mirtazapine  (REMERON ) 15 MG tablet TAKE 1 TABLET BY MOUTH EVERYDAY AT BEDTIME 30 tablet 3   naltrexone  (DEPADE) 50 MG tablet Take 1 tablet (50 mg total) by mouth daily. 30 tablet 0   pantoprazole  (PROTONIX ) 40 MG tablet Take 1 tablet (40 mg total) by mouth daily. 30 tablet 2    spironolactone  (ALDACTONE ) 25 MG tablet Take 1 tablet (25 mg total) by mouth daily. 30 tablet 11   tamsulosin  (FLOMAX ) 0.4 MG CAPS capsule TAKE 1 CAPSULE BY MOUTH EVERYDAY AT BEDTIME 30 capsule 2   thiamine  (VITAMIN B1) 100 MG tablet Take 100 mg by mouth daily.     No facility-administered medications prior to visit.   No Known Allergies Objective:   There were no vitals filed for this visit. There is no height or weight on file to calculate BMI.   General: Well developed, well nourished. No acute distress. HEENT: Normocephalic, non-traumatic. PERRL, EOMI. Conjunctiva clear. External ears normal. EAC and TMs normal bilaterally. Nose    clear without congestion or rhinorrhea. Mucous membranes moist. Oropharynx clear. Good dentition. Neck: Supple. No lymphadenopathy. No thyromegaly. Lungs: Clear to auscultation bilaterally. No wheezing, rales or rhonchi. CV: RRR without murmurs or rubs. Pulses 2+ bilaterally. Abdomen: Soft, non-tender. Bowel sounds positive, normal pitch and frequency. No hepatosplenomegaly. No rebound or guarding. Back: Straight. No CVA tenderness bilaterally. Extremities: Full ROM. No joint swelling or tenderness. No edema noted. Skin: Warm and dry. No rashes. Neuro: CN II-XII intact. Normal sensation and DTR bilaterally. Psych: Alert and oriented. Normal mood and affect.  Health Maintenance Due  Topic Date Due   COVID-19 Vaccine (1) Never done   FOOT EXAM  Never done   Diabetic kidney evaluation - Urine ACR  Never done   Hepatitis B Vaccines 19-59 Average Risk (1 of 3 - 19+ 3-dose series) Never done   Zoster Vaccines- Shingrix (1 of 2) Never done   Colonoscopy  Never done   Influenza Vaccine  Never done   Lab Results {Labs (Optional):29002}    Assessment & Plan:   Problem List Items Addressed This Visit   None   No follow-ups on file.   Beverley KATHEE Hummer, MD  I,Emily Lagle,acting as a scribe for Beverley KATHEE Hummer, MD.,have documented all relevant  documentation on the behalf of Beverley KATHEE Hummer, MD.  LILLETTE Beverley KATHEE Hummer, MD, have reviewed all documentation for this visit. The documentation on 02/09/2024 for the exam, diagnosis, procedures, and orders are all accurate and complete.

## 2024-03-18 ENCOUNTER — Other Ambulatory Visit: Payer: Self-pay | Admitting: Internal Medicine
